# Patient Record
Sex: Female | Born: 1955 | Race: White | Hispanic: No | Marital: Married | Smoking: Former smoker
Health system: Southern US, Community
[De-identification: ages and names within clinical notes are randomized; demographics above are authoritative.]

## PROBLEM LIST (undated history)

## (undated) DIAGNOSIS — E118 Type 2 diabetes mellitus with unspecified complications: Secondary | ICD-10-CM

## (undated) DIAGNOSIS — I2510 Atherosclerotic heart disease of native coronary artery without angina pectoris: Secondary | ICD-10-CM

## (undated) DIAGNOSIS — E1165 Type 2 diabetes mellitus with hyperglycemia: Secondary | ICD-10-CM

## (undated) DIAGNOSIS — Z87891 Personal history of nicotine dependence: Secondary | ICD-10-CM

## (undated) DIAGNOSIS — I2109 ST elevation (STEMI) myocardial infarction involving other coronary artery of anterior wall: Secondary | ICD-10-CM

## (undated) DIAGNOSIS — K529 Noninfective gastroenteritis and colitis, unspecified: Secondary | ICD-10-CM

## (undated) DIAGNOSIS — E7800 Pure hypercholesterolemia, unspecified: Secondary | ICD-10-CM

## (undated) HISTORY — DX: Type 2 diabetes mellitus with unspecified complications: E11.8

## (undated) HISTORY — DX: ST elevation (STEMI) myocardial infarction involving other coronary artery of anterior wall: I21.09

## (undated) HISTORY — DX: Type 2 diabetes mellitus with hyperglycemia: E11.65

## (undated) HISTORY — DX: Atherosclerotic heart disease of native coronary artery without angina pectoris: I25.10

## (undated) HISTORY — DX: Pure hypercholesterolemia, unspecified: E78.00

## (undated) HISTORY — DX: Noninfective gastroenteritis and colitis, unspecified: K52.9

## (undated) HISTORY — DX: Personal history of nicotine dependence: Z87.891

---

## 2000-06-23 ENCOUNTER — Ambulatory Visit (HOSPITAL_COMMUNITY): Admission: RE | Admit: 2000-06-23 | Discharge: 2000-06-23 | Payer: Self-pay | Admitting: Neurosurgery

## 2000-06-23 ENCOUNTER — Encounter: Payer: Self-pay | Admitting: Neurosurgery

## 2000-07-17 ENCOUNTER — Encounter: Payer: Self-pay | Admitting: Neurosurgery

## 2000-07-19 ENCOUNTER — Encounter: Payer: Self-pay | Admitting: Neurosurgery

## 2000-07-19 ENCOUNTER — Inpatient Hospital Stay (HOSPITAL_COMMUNITY): Admission: RE | Admit: 2000-07-19 | Discharge: 2000-07-21 | Payer: Self-pay | Admitting: Neurosurgery

## 2000-08-06 ENCOUNTER — Encounter: Admission: RE | Admit: 2000-08-06 | Discharge: 2000-08-06 | Payer: Self-pay | Admitting: Neurosurgery

## 2000-08-06 ENCOUNTER — Encounter: Payer: Self-pay | Admitting: Neurosurgery

## 2000-09-06 ENCOUNTER — Encounter: Payer: Self-pay | Admitting: Neurosurgery

## 2000-09-06 ENCOUNTER — Encounter: Admission: RE | Admit: 2000-09-06 | Discharge: 2000-09-06 | Payer: Self-pay | Admitting: Neurosurgery

## 2001-01-26 ENCOUNTER — Encounter: Payer: Self-pay | Admitting: Family Medicine

## 2001-01-26 ENCOUNTER — Ambulatory Visit (HOSPITAL_COMMUNITY): Admission: RE | Admit: 2001-01-26 | Discharge: 2001-01-26 | Payer: Self-pay | Admitting: Neurosurgery

## 2001-03-31 ENCOUNTER — Encounter: Payer: Self-pay | Admitting: Neurosurgery

## 2001-03-31 ENCOUNTER — Ambulatory Visit (HOSPITAL_COMMUNITY): Admission: RE | Admit: 2001-03-31 | Discharge: 2001-03-31 | Payer: Self-pay | Admitting: Neurosurgery

## 2001-05-16 ENCOUNTER — Ambulatory Visit (HOSPITAL_COMMUNITY): Admission: RE | Admit: 2001-05-16 | Discharge: 2001-05-16 | Payer: Self-pay | Admitting: Neurosurgery

## 2002-07-15 ENCOUNTER — Encounter: Payer: Self-pay | Admitting: Neurosurgery

## 2002-07-15 ENCOUNTER — Encounter: Admission: RE | Admit: 2002-07-15 | Discharge: 2002-07-15 | Payer: Self-pay | Admitting: Neurosurgery

## 2003-02-25 ENCOUNTER — Ambulatory Visit (HOSPITAL_BASED_OUTPATIENT_CLINIC_OR_DEPARTMENT_OTHER): Admission: RE | Admit: 2003-02-25 | Discharge: 2003-02-25 | Payer: Self-pay | Admitting: Orthopedic Surgery

## 2015-10-04 ENCOUNTER — Inpatient Hospital Stay (HOSPITAL_COMMUNITY)
Admission: EM | Admit: 2015-10-04 | Discharge: 2015-10-06 | DRG: 247 | Disposition: A | Discharge status: Home / Self Care | Payer: Self-pay | Attending: Cardiology | Admitting: Cardiology

## 2015-10-04 ENCOUNTER — Ambulatory Visit (HOSPITAL_COMMUNITY): Admit: 2015-10-04 | Payer: Self-pay | Admitting: Cardiology

## 2015-10-04 ENCOUNTER — Other Ambulatory Visit: Payer: Self-pay

## 2015-10-04 ENCOUNTER — Other Ambulatory Visit (HOSPITAL_COMMUNITY): Payer: Self-pay

## 2015-10-04 ENCOUNTER — Encounter (HOSPITAL_COMMUNITY): Payer: Self-pay | Admitting: *Deleted

## 2015-10-04 ENCOUNTER — Encounter (HOSPITAL_COMMUNITY): Payer: Self-pay

## 2015-10-04 ENCOUNTER — Encounter (HOSPITAL_COMMUNITY)
Admission: EM | Disposition: A | Discharge status: Home / Self Care | Payer: Self-pay | Source: Home / Self Care | Attending: Cardiology

## 2015-10-04 DIAGNOSIS — Z79891 Long term (current) use of opiate analgesic: Secondary | ICD-10-CM

## 2015-10-04 DIAGNOSIS — I213 ST elevation (STEMI) myocardial infarction of unspecified site: Secondary | ICD-10-CM

## 2015-10-04 DIAGNOSIS — I10 Essential (primary) hypertension: Secondary | ICD-10-CM | POA: Diagnosis present

## 2015-10-04 DIAGNOSIS — R739 Hyperglycemia, unspecified: Secondary | ICD-10-CM

## 2015-10-04 DIAGNOSIS — G894 Chronic pain syndrome: Secondary | ICD-10-CM | POA: Diagnosis present

## 2015-10-04 DIAGNOSIS — I2109 ST elevation (STEMI) myocardial infarction involving other coronary artery of anterior wall: Principal | ICD-10-CM | POA: Diagnosis present

## 2015-10-04 DIAGNOSIS — E1169 Type 2 diabetes mellitus with other specified complication: Secondary | ICD-10-CM

## 2015-10-04 DIAGNOSIS — E872 Acidosis: Secondary | ICD-10-CM | POA: Diagnosis present

## 2015-10-04 DIAGNOSIS — M549 Dorsalgia, unspecified: Secondary | ICD-10-CM | POA: Diagnosis present

## 2015-10-04 DIAGNOSIS — M5030 Other cervical disc degeneration, unspecified cervical region: Secondary | ICD-10-CM | POA: Diagnosis present

## 2015-10-04 DIAGNOSIS — E785 Hyperlipidemia, unspecified: Secondary | ICD-10-CM | POA: Diagnosis present

## 2015-10-04 DIAGNOSIS — Z955 Presence of coronary angioplasty implant and graft: Secondary | ICD-10-CM

## 2015-10-04 DIAGNOSIS — E1165 Type 2 diabetes mellitus with hyperglycemia: Secondary | ICD-10-CM | POA: Diagnosis present

## 2015-10-04 DIAGNOSIS — F17200 Nicotine dependence, unspecified, uncomplicated: Secondary | ICD-10-CM | POA: Diagnosis present

## 2015-10-04 HISTORY — PX: CARDIAC CATHETERIZATION: SHX172

## 2015-10-04 HISTORY — PX: PERIPHERAL VASCULAR CATHETERIZATION: SHX172C

## 2015-10-04 HISTORY — DX: ST elevation (STEMI) myocardial infarction involving other coronary artery of anterior wall: I21.09

## 2015-10-04 LAB — TROPONIN I
Troponin I: >65 ng/mL (ref <0.031)
Troponin I: >65 ng/mL

## 2015-10-04 LAB — CBC
HCT: 36.7 % (ref 36.0–46.0)
Hemoglobin: 12.1 g/dL (ref 12.0–15.0)
MCH: 28.4 pg (ref 26.0–34.0)
MCHC: 33 g/dL (ref 30.0–36.0)
MCV: 86.2 fL (ref 78.0–100.0)
Platelets: 197 10*3/uL (ref 150–400)
RBC: 4.26 MIL/uL (ref 3.87–5.11)
RDW: 13.3 % (ref 11.5–15.5)
WBC: 17 10*3/uL — ABNORMAL HIGH (ref 4.0–10.5)

## 2015-10-04 LAB — POCT I-STAT 3, ART BLOOD GAS (G3+)
ACID-BASE DEFICIT: 3 mmol/L — AB (ref 0.0–2.0)
Acid-base deficit: 4 mmol/L — ABNORMAL HIGH (ref 0.0–2.0)
BICARBONATE: 22.9 meq/L (ref 20.0–24.0)
Bicarbonate: 22.9 meq/L (ref 20.0–24.0)
O2 SAT: 77 %
O2 Saturation: 97 %
PCO2 ART: 44.2 mmHg (ref 35.0–45.0)
PO2 ART: 45 mmHg — AB (ref 80.0–100.0)
TCO2: 24 mmol/L (ref 0–100)
TCO2: 24 mmol/L (ref 0–100)
pCO2 arterial: 48.6 mmHg — ABNORMAL HIGH (ref 35.0–45.0)
pH, Arterial: 7.282 — ABNORMAL LOW (ref 7.350–7.450)
pH, Arterial: 7.323 — ABNORMAL LOW (ref 7.350–7.450)
pO2, Arterial: 102 mmHg — ABNORMAL HIGH (ref 80.0–100.0)

## 2015-10-04 LAB — GLUCOSE, CAPILLARY
Glucose-Capillary: 294 mg/dL — ABNORMAL HIGH (ref 65–99)
Glucose-Capillary: 211 mg/dL — ABNORMAL HIGH (ref 65–99)

## 2015-10-04 LAB — CREATININE, SERUM
Creatinine, Ser: 0.54 mg/dL (ref 0.44–1.00)
GFR calc Af Amer: >60 mL/min
GFR calc non Af Amer: >60 mL/min

## 2015-10-04 LAB — LIPID PANEL
CHOL/HDL RATIO: 3 ratio
Cholesterol: 128 mg/dL (ref 0–200)
HDL: 42 mg/dL (ref >40)
LDL CALC: 73 mg/dL (ref 0–99)
TRIGLYCERIDES: 66 mg/dL (ref <150)
VLDL: 13 mg/dL (ref 0–40)

## 2015-10-04 LAB — MRSA PCR SCREENING: MRSA by PCR: NEGATIVE

## 2015-10-04 LAB — POCT ACTIVATED CLOTTING TIME
Activated Clotting Time: 252 seconds
Activated Clotting Time: 263 s
Activated Clotting Time: 296 s

## 2015-10-04 LAB — TSH: TSH: 0.613 u[IU]/mL (ref 0.350–4.500)

## 2015-10-04 SURGERY — LEFT HEART CATH AND CORONARY ANGIOGRAPHY
Anesthesia: LOCAL

## 2015-10-04 MED ORDER — ACETAMINOPHEN 325 MG PO TABS
650.0000 mg | ORAL_TABLET | ORAL | Status: DC | PRN
  Administered 2015-10-05: 650 mg via ORAL
  Filled 2015-10-04: qty 2

## 2015-10-04 MED ORDER — LIDOCAINE HCL (PF) 1 % IJ SOLN
INTRAMUSCULAR | Status: AC
  Filled 2015-10-04: qty 30

## 2015-10-04 MED ORDER — HYDROMORPHONE HCL 1 MG/ML IJ SOLN
INTRAMUSCULAR | Status: DC | PRN
  Administered 2015-10-04: 0.5 mg via INTRAVENOUS

## 2015-10-04 MED ORDER — HEPARIN SODIUM (PORCINE) 1000 UNIT/ML IJ SOLN
INTRAMUSCULAR | Status: AC
  Filled 2015-10-04: qty 1

## 2015-10-04 MED ORDER — PRASUGREL HCL 10 MG PO TABS
ORAL_TABLET | ORAL | Status: AC
  Filled 2015-10-04: qty 1

## 2015-10-04 MED ORDER — FENTANYL CITRATE (PF) 100 MCG/2ML IJ SOLN: 50.0000 ug | INTRAMUSCULAR | Status: DC | PRN

## 2015-10-04 MED ORDER — PRASUGREL HCL 10 MG PO TABS
ORAL_TABLET | ORAL | Status: AC
  Filled 2015-10-04: qty 4

## 2015-10-04 MED ORDER — HEPARIN (PORCINE) IN NACL 2-0.9 UNIT/ML-% IJ SOLN
INTRAMUSCULAR | Status: AC
  Filled 2015-10-04: qty 1500

## 2015-10-04 MED ORDER — IOPAMIDOL (ISOVUE-370) INJECTION 76%
INTRAVENOUS | Status: AC
  Filled 2015-10-04: qty 50

## 2015-10-04 MED ORDER — ASPIRIN 81 MG PO CHEW
81.0000 mg | CHEWABLE_TABLET | Freq: Every day | ORAL | Status: DC
  Administered 2015-10-05 – 2015-10-06 (×2): 81 mg via ORAL
  Filled 2015-10-04 (×2): qty 1

## 2015-10-04 MED ORDER — VERAPAMIL HCL 2.5 MG/ML IV SOLN
INTRAVENOUS | Status: AC
  Filled 2015-10-04: qty 2

## 2015-10-04 MED ORDER — ENOXAPARIN SODIUM 40 MG/0.4ML ~~LOC~~ SOLN
40.0000 mg | SUBCUTANEOUS | Status: DC
  Administered 2015-10-05 – 2015-10-06 (×2): 40 mg via SUBCUTANEOUS
  Filled 2015-10-04 (×2): qty 0.4

## 2015-10-04 MED ORDER — INSULIN ASPART 100 UNIT/ML ~~LOC~~ SOLN
0.0000 [IU] | Freq: Three times a day (TID) | SUBCUTANEOUS | Status: DC
  Administered 2015-10-04 – 2015-10-05 (×3): 8 [IU] via SUBCUTANEOUS
  Administered 2015-10-05: 5 [IU] via SUBCUTANEOUS
  Administered 2015-10-06: 3 [IU] via SUBCUTANEOUS
  Administered 2015-10-06: 5 [IU] via SUBCUTANEOUS

## 2015-10-04 MED ORDER — IOPAMIDOL (ISOVUE-370) INJECTION 76%
INTRAVENOUS | Status: DC | PRN
  Administered 2015-10-04: 300 mL via INTRA_ARTERIAL

## 2015-10-04 MED ORDER — ONDANSETRON HCL 4 MG/2ML IJ SOLN: 4.0000 mg | Freq: Once | INTRAMUSCULAR | Status: DC

## 2015-10-04 MED ORDER — SODIUM CHLORIDE 0.9 % IV SOLN: 250.0000 mL | INTRAVENOUS | Status: DC | PRN

## 2015-10-04 MED ORDER — HEPARIN (PORCINE) IN NACL 2-0.9 UNIT/ML-% IJ SOLN
INTRAMUSCULAR | Status: DC | PRN
  Administered 2015-10-04: 1500 mL

## 2015-10-04 MED ORDER — HYDROMORPHONE HCL 1 MG/ML IJ SOLN
INTRAMUSCULAR | Status: AC
  Filled 2015-10-04: qty 1

## 2015-10-04 MED ORDER — SODIUM CHLORIDE 0.9% FLUSH
3.0000 mL | INTRAVENOUS | Status: DC | PRN
Start: 2015-10-04 — End: 2015-10-06
  Administered 2015-10-05: 3 mL via INTRAVENOUS
  Filled 2015-10-04: qty 3

## 2015-10-04 MED ORDER — ONDANSETRON HCL 4 MG/2ML IJ SOLN
4.0000 mg | Freq: Four times a day (QID) | INTRAMUSCULAR | Status: DC | PRN
Start: 2015-10-04 — End: 2015-10-06

## 2015-10-04 MED ORDER — MIDAZOLAM HCL 2 MG/2ML IJ SOLN
INTRAMUSCULAR | Status: AC
  Filled 2015-10-04: qty 2

## 2015-10-04 MED ORDER — MORPHINE SULFATE (PF) 4 MG/ML IV SOLN
4.0000 mg | Freq: Once | INTRAVENOUS | Status: DC
Start: 2015-10-04 — End: 2015-10-04

## 2015-10-04 MED ORDER — IOPAMIDOL (ISOVUE-370) INJECTION 76%
INTRAVENOUS | Status: AC
  Filled 2015-10-04: qty 125

## 2015-10-04 MED ORDER — METOPROLOL TARTRATE 12.5 MG HALF TABLET
12.5000 mg | ORAL_TABLET | Freq: Two times a day (BID) | ORAL | Status: DC
  Administered 2015-10-04 – 2015-10-06 (×4): 12.5 mg via ORAL
  Filled 2015-10-04 (×4): qty 1

## 2015-10-04 MED ORDER — VERAPAMIL HCL 2.5 MG/ML IV SOLN
INTRA_ARTERIAL | Status: DC | PRN
  Administered 2015-10-04: 15 mL via INTRA_ARTERIAL

## 2015-10-04 MED ORDER — PRASUGREL HCL 10 MG PO TABS
10.0000 mg | ORAL_TABLET | Freq: Every day | ORAL | Status: DC
  Administered 2015-10-05 – 2015-10-06 (×2): 10 mg via ORAL
  Filled 2015-10-04 (×2): qty 1

## 2015-10-04 MED ORDER — BISOPROLOL FUMARATE 5 MG PO TABS: 5.0000 mg | ORAL_TABLET | Freq: Every day | ORAL | Status: DC

## 2015-10-04 MED ORDER — NITROGLYCERIN 1 MG/10 ML FOR IR/CATH LAB
INTRA_ARTERIAL | Status: AC
  Filled 2015-10-04: qty 10

## 2015-10-04 MED ORDER — SODIUM CHLORIDE 0.9% FLUSH
3.0000 mL | Freq: Two times a day (BID) | INTRAVENOUS | Status: DC
  Administered 2015-10-04: 3 mL via INTRAVENOUS

## 2015-10-04 MED ORDER — INSULIN ASPART 100 UNIT/ML ~~LOC~~ SOLN
3.0000 [IU] | Freq: Three times a day (TID) | SUBCUTANEOUS | Status: DC
  Administered 2015-10-04 – 2015-10-06 (×6): 3 [IU] via SUBCUTANEOUS

## 2015-10-04 MED ORDER — ATORVASTATIN CALCIUM 80 MG PO TABS
80.0000 mg | ORAL_TABLET | Freq: Every day | ORAL | Status: DC
  Administered 2015-10-04 – 2015-10-05 (×2): 80 mg via ORAL
  Filled 2015-10-04 (×2): qty 1

## 2015-10-04 MED ORDER — LIDOCAINE HCL (PF) 1 % IJ SOLN
INTRAMUSCULAR | Status: DC | PRN
  Administered 2015-10-04: 3 mL

## 2015-10-04 MED ORDER — MIDAZOLAM HCL 2 MG/2ML IJ SOLN
INTRAMUSCULAR | Status: DC | PRN
  Administered 2015-10-04: 3 mg via INTRAVENOUS

## 2015-10-04 MED ORDER — HEPARIN SODIUM (PORCINE) 1000 UNIT/ML IJ SOLN
INTRAMUSCULAR | Status: DC | PRN
  Administered 2015-10-04: 7000 [IU] via INTRAVENOUS
  Administered 2015-10-04 (×2): 3000 [IU] via INTRAVENOUS
  Administered 2015-10-04: 1000 [IU] via INTRAVENOUS

## 2015-10-04 MED ORDER — PRASUGREL HCL 10 MG PO TABS
ORAL_TABLET | ORAL | Status: DC | PRN
  Administered 2015-10-04: 60 mg via ORAL

## 2015-10-04 MED ORDER — SODIUM CHLORIDE 0.9 % WEIGHT BASED INFUSION
1.0000 mL/kg/h | INTRAVENOUS | Status: AC
  Administered 2015-10-04: 1 mL/kg/h via INTRAVENOUS

## 2015-10-04 SURGICAL SUPPLY — 20 items
BALLN EMERGE MR 2.0X8 (BALLOONS) ×3
BALLN EUPHORA RX 3.5X15 (BALLOONS) ×3
BALLOON EMERGE MR 2.0X8 (BALLOONS) ×2 IMPLANT
BALLOON EUPHORA RX 3.5X15 (BALLOONS) ×2 IMPLANT
CATH EXTRAC PRONTO 5.5F 138CM (CATHETERS) ×3 IMPLANT
CATH INFINITI 5FR ANG PIGTAIL (CATHETERS) ×3 IMPLANT
CATH OPTITORQUE TIG 4.0 5F (CATHETERS) ×3 IMPLANT
CATH VISTA GUIDE 6FR XBLAD3.5 (CATHETERS) ×3 IMPLANT
DEVICE RAD COMP TR BAND LRG (VASCULAR PRODUCTS) ×3 IMPLANT
GLIDESHEATH SLEND A-KIT 6F 20G (SHEATH) ×3 IMPLANT
KIT ENCORE 26 ADVANTAGE (KITS) ×3 IMPLANT
KIT ESSENTIALS PG (KITS) ×3 IMPLANT
KIT HEART LEFT (KITS) ×3 IMPLANT
PACK CARDIAC CATHETERIZATION (CUSTOM PROCEDURE TRAY) ×3 IMPLANT
STENT XIENCE ALPINE RX 3.5X12 (Permanent Stent) ×3 IMPLANT
STENT XIENCE ALPINE RX 3.5X18 (Permanent Stent) ×3 IMPLANT
TRANSDUCER W/STOPCOCK (MISCELLANEOUS) ×3 IMPLANT
TUBING CIL FLEX 10 FLL-RA (TUBING) ×3 IMPLANT
WIRE COUGAR XT STRL 190CM (WIRE) ×3 IMPLANT
WIRE SAFE-T 1.5MM-J .035X260CM (WIRE) ×3 IMPLANT

## 2015-10-04 NOTE — H&P (Signed)
Sonia Morales is an 60 y.o. female.   Chief Complaint: Chest pain and arm pain HPI: Sonia Morales  is a 60 y.o. female  With history of diabetes mellitus, hypertension, hyperlipidemia, ongoing tobacco use disorder, chronic pain syndrome, chronic back pain and neck pain and on chronic methadone therapy, admitted with upper chest discomfort and radiation to the arms, history very difficult to make out, initially admitted to [facility], there was suspicion for STEMI however there was ST elevation in lead V2 without any other reciprocal changes. As symptoms were very atypical and difficult to make out due to chronic narcotic use, felt that she needed to be transferred to the hospital on an urgent basis with continued reevaluation. Due to chest pain patient was transferred to [facility] emergency room.  In the emergency room repeat EKG revealed ST elevation anterolateral leads that was clear-cut to suggest anterolateral ST elevation MI. Hence emergently patient was transferred to the cardiac catheterization lab.  Patient has chronic dyspnea on exertion. No PND or orthopnea. Denied any neurological deficits or prior stroke. Denies symptoms of claudication.  Social History: Active smoker. Smokes at least one pack of cigars per day.  Family history: No family history of premature coronary artery disease.  Past medical history: As dictated above in the history of present illness.  Allergies: Allergies no known allergies  Review of Systems - Negative except Chest pain and dyspnea, chronic back pain and neck pain.  Blood pressure 117/78, pulse 107, temperature 98.2 F (36.8 C), temperature source Oral, resp. rate 17, height 5\' 3"  (1.6 m), weight 73.8 kg (162 lb 11.2 oz), SpO2 92 %. General appearance: alert, cooperative, appears older than stated age, mild distress and Due to pain. Eyes: negative findings: lids and lashes normal Neck: no adenopathy, no carotid bruit, no JVD, supple, symmetrical,  trachea midline and thyroid not enlarged, symmetric, no tenderness/mass/nodules Neck: JVP - normal, carotids 2+= without bruits Resp: clear to auscultation bilaterally Chest wall: no tenderness Cardio: regular rate and rhythm, S1, S2 normal, no murmur, click, rub or gallop GI: soft, non-tender; bowel sounds normal; no masses,  no organomegaly Extremities: extremities normal, atraumatic, no cyanosis or edema Pulses: Bilateral carotids without bruit, femoral pulses normal without bruit, popliteal pulses faint, left dorsalis pedis normal, right 1+. PT could not be felt bilaterally. Skin: Skin color, texture, turgor normal. No rashes or lesions Neurologic: Grossly normal  Results for orders placed or performed during the hospital encounter of 10/04/15 (from the past 48 hour(s))  I-STAT 3, arterial blood gas (G3+)     Status: Abnormal   Collection Time: 10/04/15  3:00 PM  Result Value Ref Range   pH, Arterial 7.323 (L) 7.350 - 7.450   pCO2 arterial 44.2 35.0 - 45.0 mmHg   pO2, Arterial 45.0 (L) 80.0 - 100.0 mmHg   Bicarbonate 22.9 20.0 - 24.0 mEq/L   TCO2 24 0 - 100 mmol/L   O2 Saturation 77.0 %   Acid-base deficit 3.0 (H) 0.0 - 2.0 mmol/L   Patient temperature HIDE    Sample type ARTERIAL   POCT Activated clotting time     Status: None   Collection Time: 10/04/15  3:05 PM  Result Value Ref Range   Activated Clotting Time 252 seconds  POCT Activated clotting time     Status: None   Collection Time: 10/04/15  3:18 PM  Result Value Ref Range   Activated Clotting Time 263 seconds  I-STAT 3, arterial blood gas (G3+)  Status: Abnormal   Collection Time: 10/04/15  3:19 PM  Result Value Ref Range   pH, Arterial 7.282 (L) 7.350 - 7.450   pCO2 arterial 48.6 (H) 35.0 - 45.0 mmHg   pO2, Arterial 102.0 (H) 80.0 - 100.0 mmHg   Bicarbonate 22.9 20.0 - 24.0 mEq/L   TCO2 24 0 - 100 mmol/L   O2 Saturation 97.0 %   Acid-base deficit 4.0 (H) 0.0 - 2.0 mmol/L   Patient temperature HIDE    Sample  type ARTERIAL   POCT Activated clotting time     Status: None   Collection Time: 10/04/15  3:56 PM  Result Value Ref Range   Activated Clotting Time 296 seconds  Glucose, capillary     Status: Abnormal   Collection Time: 10/04/15  4:50 PM  Result Value Ref Range   Glucose-Capillary 294 (H) 65 - 99 mg/dL   Comment 1 Capillary Specimen   EKG 10/04/2015 at 1429 hrs.: Sinus tachycardia at the rate of 113 bpm, left axis deviation, left anterior fascicular block. Poor R progression. LVH. 1 mm ST segment elevation in lead V2 through V5 and 1 and aVL, STEMI anterolateral leads. Compared to prior EKG, anterolateral ST elevation now present.   Current facility-administered medications:  .  0.9 %  sodium chloride infusion, 250 mL, Intravenous, PRN, Yates Decamp, MD .  0.9% sodium chloride infusion, 1 mL/kg/hr, Intravenous, Continuous, Yates Decamp, MD .  acetaminophen (TYLENOL) tablet 650 mg, 650 mg, Oral, Q4H PRN, Yates Decamp, MD .  aspirin chewable tablet 81 mg, 81 mg, Oral, Daily, Yates Decamp, MD, 81 mg at 10/04/15 1651 .  [START ON 10/05/2015] enoxaparin (LOVENOX) injection 40 mg, 40 mg, Subcutaneous, Q24H, Yates Decamp, MD .  insulin aspart (novoLOG) injection 0-15 Units, 0-15 Units, Subcutaneous, TID WC, Yates Decamp, MD .  insulin aspart (novoLOG) injection 3 Units, 3 Units, Subcutaneous, TID WC, Yates Decamp, MD .  ondansetron (ZOFRAN) injection 4 mg, 4 mg, Intravenous, Q6H PRN, Yates Decamp, MD .  Melene Muller ON 10/05/2015] prasugrel (EFFIENT) tablet 10 mg, 10 mg, Oral, Daily, Yates Decamp, MD .  sodium chloride flush (NS) 0.9 % injection 3 mL, 3 mL, Intravenous, Q12H, Yates Decamp, MD .  sodium chloride flush (NS) 0.9 % injection 3 mL, 3 mL, Intravenous, PRN, Yates Decamp, MD  Assessment/Plan 1. Anterolateral STEMI 2. Diabetes mellitus type 2 uncontrolled 3. Hypertension 4. Hyperlipidemia 5. Diabetes mellitus type 2 uncontrolled 6. Chronic pain syndrome 7. Mild respiratory acidosis.  Recommendation: Patient taken  emergently to cardiac catheterization lab to evaluate coronary anatomy. Further recommendations will follow following angiography. Emergent consent obtained.  Yates Decamp, MD 10/04/2015, 4:58 PM [facility] Pager: [phone] Office: [phone] If no answer: Cell:  [phone]

## 2015-10-04 NOTE — Progress Notes (Signed)
Notified Cards Fellow of [facility]G 211. No new orders given and will re check in AM at 0800

## 2015-10-04 NOTE — Progress Notes (Signed)
Pt c/o chest pain, mid sternal, radiating to arms bilaterally. 4/10 with some "mild" SOB. RR 18 and O2 98% on 4L [place]. Pt not in distress. BP 97/86, HR 91 NSR. EKG obtained and Dr. Jacinto Halim called. Orders for fentanyl PRN and changes to beta blocker received.

## 2015-10-04 NOTE — ED Provider Notes (Signed)
CSN: [id number]     Arrival date & time 10/04/15  1429 History   First MD Initiated Contact with Patient 10/04/15 1431     No chief complaint on file.  PT IS A 60 YO WF Sonia Morales WITH CP.  PT HAD A POSITIVE TROPONIN WITH CONTINUED PAIN.  IT WAS INITIALLY CALLED OUT AS A CODE STEMI, BUT THEN IT WAS CANCELLED EN ROUTE, SO PT CAME TO THE ED.  STEMI COORDINATOR MET PT AT [place].  (Consider location/radiation/quality/duration/timing/severity/associated sxs/prior Treatment) The history is provided by the patient.    No past medical history on file.DM No past surgical history on file. No family history on file. Social History  Substance Use Topics  . Smoking status: Not on file  . Smokeless tobacco: Not on file  . Alcohol Use: Not on file  +TOB OB History    No data available     Review of Systems  Cardiovascular: Positive for chest pain.  All other systems reviewed and are negative.     Allergies  Review of patient's allergies indicates not on file.  Home Medications   Prior to Admission medications   Not on File   There were no vitals taken for this visit. Physical Exam  Constitutional: She is oriented to person, place, and time. She appears well-developed. She appears distressed.  HENT:  Head: Normocephalic and atraumatic.  Right Ear: External ear normal.  Left Ear: External ear normal.  Nose: Nose normal.  Mouth/Throat: Oropharynx is clear and moist.  Eyes: Conjunctivae and EOM are normal. Pupils are equal, round, and reactive to light.  Neck: Normal range of motion. Neck supple.  Cardiovascular: Normal rate, regular rhythm, normal heart sounds and intact distal pulses.   Pulmonary/Chest: Effort normal and breath sounds normal.  Abdominal: Soft. Bowel sounds are normal.  Musculoskeletal: Normal range of motion.  Neurological: She is alert and oriented to person, place, and time.  Skin: Skin is warm.  Psychiatric: She has a normal mood  and affect. Her behavior is normal. Judgment and thought content normal.  Nursing note and vitals reviewed.   ED Course  Procedures (including critical care time) Labs Review Labs Reviewed - No data to display  Imaging Review No results found. I have personally reviewed and evaluated these images and lab results as part of my medical decision-making.   EKG Interpretation None     EKG WITH ST ELEVATION LATERALLY MDM  LABS AND CHART REVIEWED FROM OUTSIDE HOSPITAL. Final diagnoses:  ST elevation myocardial infarction (STEMI), unspecified artery ([place])  Hyperglycemia      Sonia Pence, MD 10/04/15 1515

## 2015-10-05 ENCOUNTER — Encounter (HOSPITAL_COMMUNITY): Payer: Self-pay | Admitting: Cardiology

## 2015-10-05 ENCOUNTER — Inpatient Hospital Stay (HOSPITAL_COMMUNITY): Payer: MEDICAID

## 2015-10-05 LAB — HEMOGLOBIN A1C
Hgb A1c MFr Bld: 10.6 % — ABNORMAL HIGH (ref 4.8–5.6)
MEAN PLASMA GLUCOSE: 258 mg/dL

## 2015-10-05 LAB — BASIC METABOLIC PANEL
ANION GAP: 9 (ref 5–15)
BUN: 10 mg/dL (ref 6–20)
CHLORIDE: 103 mmol/L (ref 101–111)
CO2: 25 mmol/L (ref 22–32)
Calcium: 9.2 mg/dL (ref 8.9–10.3)
Creatinine, Ser: 0.56 mg/dL (ref 0.44–1.00)
GFR calc Af Amer: >60 mL/min (ref >60)
GLUCOSE: 259 mg/dL — AB (ref 65–99)
POTASSIUM: 3.8 mmol/L (ref 3.5–5.1)
SODIUM: 137 mmol/L (ref 135–145)

## 2015-10-05 LAB — ECHOCARDIOGRAM COMPLETE
CHL CUP STROKE VOLUME: 20 mL
E/e' ratio: 17.57
EWDT: 106 ms
FS: 24 % — AB (ref 28–44)
Height: 63 in
IVS/LV PW RATIO, ED: 1.26
LA ID, A-P, ES: 36 mm
LA vol index: 29.2 mL/m2
LADIAMINDEX: 1.96 cm/m2
LAVOL: 53.6 mL
LAVOLA4C: 55.9 mL
LEFT ATRIUM END SYS DIAM: 36 mm
LV TDI E'LATERAL: 5.75
LV TDI E'MEDIAL: 6.37
LV sys vol index: 33 mL/m2
LV sys vol: 60 mL — AB (ref 14–42)
LVDIAVOL: 80 mL (ref 46–106)
LVDIAVOLIN: 44 mL/m2
LVEEAVG: 17.57
LVEEMED: 17.57
LVELAT: 5.75 cm/s
LVOT area: 3.46 cm2
LVOT diameter: 21 mm
MV Dec: 106
MV Peak grad: 4 mmHg
MVPKAVEL: 101 m/s
MVPKEVEL: 101 m/s
PW: 11.8 mm — AB (ref 0.6–1.1)
RV TAPSE: 18.9 mm
Simpson's disk: 25
Weight: 2603.19 oz

## 2015-10-05 LAB — CBC
HEMATOCRIT: 36.8 % (ref 36.0–46.0)
HEMOGLOBIN: 12 g/dL (ref 12.0–15.0)
MCH: 28.8 pg (ref 26.0–34.0)
MCHC: 32.6 g/dL (ref 30.0–36.0)
MCV: 88.2 fL (ref 78.0–100.0)
Platelets: 186 10*3/uL (ref 150–400)
RBC: 4.17 MIL/uL (ref 3.87–5.11)
RDW: 13.5 % (ref 11.5–15.5)
WBC: 16.2 10*3/uL — AB (ref 4.0–10.5)

## 2015-10-05 LAB — TROPONIN I
TROPONIN I: 60.35 ng/mL — AB (ref <0.031)
Troponin I: 47.25 ng/mL (ref <0.031)
Troponin I: 35.79 ng/mL (ref <0.031)

## 2015-10-05 LAB — GLUCOSE, CAPILLARY
GLUCOSE-CAPILLARY: 267 mg/dL — AB (ref 65–99)
GLUCOSE-CAPILLARY: 237 mg/dL — AB (ref 65–99)
GLUCOSE-CAPILLARY: 263 mg/dL — AB (ref 65–99)
Glucose-Capillary: 102 mg/dL — ABNORMAL HIGH (ref 65–99)

## 2015-10-05 MED ORDER — OXYCODONE HCL 5 MG PO TABS
10.0000 mg | ORAL_TABLET | Freq: Three times a day (TID) | ORAL | Status: DC | PRN
  Administered 2015-10-05 (×2): 20 mg via ORAL
  Filled 2015-10-05 (×2): qty 4

## 2015-10-05 MED ORDER — HEART ATTACK BOUNCING BOOK
Freq: Once | Status: AC
  Administered 2015-10-05: 08:00:00
  Filled 2015-10-05: qty 1

## 2015-10-05 MED ORDER — METHADONE HCL 10 MG PO TABS
10.0000 mg | ORAL_TABLET | Freq: Three times a day (TID) | ORAL | Status: DC
  Administered 2015-10-05 – 2015-10-06 (×4): 10 mg via ORAL
  Filled 2015-10-05 (×4): qty 1

## 2015-10-05 MED ORDER — VENLAFAXINE HCL 37.5 MG PO TABS
37.5000 mg | ORAL_TABLET | Freq: Two times a day (BID) | ORAL | Status: DC | PRN
  Filled 2015-10-05: qty 1

## 2015-10-05 MED ORDER — GLYBURIDE 5 MG PO TABS
5.0000 mg | ORAL_TABLET | Freq: Two times a day (BID) | ORAL | Status: DC
  Administered 2015-10-05 – 2015-10-06 (×3): 5 mg via ORAL
  Filled 2015-10-05 (×4): qty 1

## 2015-10-05 MED ORDER — LIVING WELL WITH DIABETES BOOK
Freq: Once | Status: AC
  Administered 2015-10-05: 08:00:00
  Filled 2015-10-05: qty 1

## 2015-10-05 MED ORDER — NICOTINE 14 MG/24HR TD PT24
14.0000 mg | MEDICATED_PATCH | Freq: Every day | TRANSDERMAL | Status: DC
  Administered 2015-10-05 – 2015-10-06 (×2): 14 mg via TRANSDERMAL
  Filled 2015-10-05 (×2): qty 1

## 2015-10-05 NOTE — Progress Notes (Signed)
CARDIAC REHAB PHASE I   PRE:  Rate/Rhythm: 102 ST    BP: sitting 99/78    SaO2: 94 RA  MODE:  Ambulation: 90 ft   POST:  Rate/Rhythm: 118 ST    BP: sitting 106/72     SaO2: 93 Ra  Pt c/o significant chest pain. Sts its 7/10 at rest, worse with deep breaths. Sts it feels like she has been punched. Pt nervous to get up due to "weakness" and sts she used the bedpan earlier. Encouraged pt to slowly get up which she was able to do. Slightly off balance walking, increased labor of breathing, HR up to 118 ST. Walked short distance and return to recliner. VSS, sts her CP did not increase or decrease. I gave her education materials however she had a visitor so did not get to discuss.  Husband present. He sts she normally is very active and only takes her methadone 1-2 times a day (prescribed x3 qd). Will f/u tomorrow. [phone]  Harriet Masson CES, ACSM 10/05/2015 11:24 AM

## 2015-10-05 NOTE — Care Management Note (Signed)
Case Management Note  Patient Details  Name: Sonia Morales MRN: [id number] Date of Birth: [birth year]  Subjective/Objective:    Adm w mi                Action/Plan: lives w husband, lives in [place]   Expected Discharge Date:                  Expected Discharge Plan:  Home/Self Care  In-House Referral:     Discharge planning Services  CM Consult, Medication Assistance, Indigent Health Clinic  Post Acute Care Choice:    Choice offered to:     DME Arranged:    DME Agency:     HH Arranged:    HH Agency:     Status of Service:     If discussed at [employer], dates discussed:    Additional Comments: gave pt 30day free effeint card. No ins. Gave pt inform on [facility]. Placed effient pt assist form on shadow chart.  Hanley Hays, RN 10/05/2015, 9:38 AM

## 2015-10-05 NOTE — Progress Notes (Signed)
Subjective:  No further chest burning (angina). C/O Chest pain on deep breath. Feels much better. Breathing  Better.  Objective:  Vital Signs in the last 24 hours: Temp:  [98.2 F (36.8 C)-98.9 F (37.2 C)] 98.8 F (37.1 C) (06/21 0800) Pulse Rate:  [85-145] 105 (06/21 0800) Resp:  [7-62] 21 (06/21 0800) BP: (99-152)/(62-105) 114/62 mmHg (06/21 0800) SpO2:  [82 %-99 %] 97 % (06/21 0800) Weight:  [73.8 kg (162 lb 11.2 oz)] 73.8 kg (162 lb 11.2 oz) (06/20 1630)  Intake/Output from previous day: 06/20 0701 - 06/21 0700 In: 303.8 [I.V.:303.8] Out: 1250 [Urine:1250]  Physical Exam: General appearance: alert, cooperative, appears older than stated age, mild distress and Due to pain. Eyes: negative findings: lids and lashes normal Neck: no adenopathy, no carotid bruit, no JVD, supple, symmetrical, trachea midline and thyroid not enlarged, symmetric, no tenderness/mass/nodules Neck: JVP - normal, carotids 2+= without bruits Resp: clear to auscultation bilaterally Chest wall: no tenderness Cardio: regular rate and rhythm, S1, S2 normal, no murmur, click, rub or gallop GI: soft, non-tender; bowel sounds normal; no masses, no organomegaly Extremities: extremities normal, atraumatic, no cyanosis or edema Pulses: Right  Carotid bruit, femoral pulses normal without bruit, popliteal pulses faint, left dorsalis pedis normal, right 1+. PT could not be felt bilaterally.  Lab Results: BMP  Recent Labs  10/04/15 1814 10/05/15 0410  NA  --  137  K  --  3.8  CL  --  103  CO2  --  25  GLUCOSE  --  259*  BUN  --  10  CREATININE 0.54 0.56  CALCIUM  --  9.2  GFRNONAA >60 >60  GFRAA >60 >60    CBC  Recent Labs Lab 10/05/15 0410  WBC 16.2*  RBC 4.17  HGB 12.0  HCT 36.8  PLT 186  MCV 88.2  MCH 28.8  MCHC 32.6  RDW 13.5    HEMOGLOBIN A1C Lab Results  Component Value Date   HGBA1C 10.6* 10/04/2015   MPG 258 10/04/2015    Cardiac Panel (last 3 results)  Recent Labs  10/04/15 1814 10/04/15 2247 10/05/15 0410  TROPONINI >65.00* >65.00* >65.00*   Recent Labs  10/04/15 1814  TSH 0.613   Cardiac Studies: Echo pending  EKG: Evolving anterolateral MI.   Assessment/Plan:  1. Anterolateral STEMI  Coronary angio: Left main mild calcification. LAD tortuous origin, occluded in the proximal segment. Diagonal 2 is moderate-sized, has proximal 50% stenosis. Mild disease in the mid to distal LAD. S/P thrombectomy followed by DES stenting with overlapping 3.5 x 18 and 3.5 x 12 mm Xience Alpine stents, 100% reduced to 0% TIMI 0 flow improved TIMI-3 flow.  Ramus intermediate has a tortuous origin. Mild disease. Circumflex mild disease. Right coronary artery mild diffuse disease.  2. Diabetes mellitus type 2 uncontrolled 3. Hypertension 4. Hyperlipidemia 5. Diabetes mellitus type 2 uncontrolled 6. Chronic pain syndrome 7. Right carotid bruit.  Rec: Add Glyburide. DM coordinator, Transfer to tele. Smoking cessation. F/Y Trop. Home in 24 to 48 hours.   Yates Decamp, M.D. 10/05/2015, 8:43 AM [facility] Pager: [phone] Office: [phone] If no answer: [phone]

## 2015-10-05 NOTE — Progress Notes (Signed)
Patient continues to C/O 6/10 midsternal chest soreness, states, "it feels like someone punched me."  Patient endorses SOB during ambulation and HR in 110's.  Dr. Jacinto Halim notified and says he is not concerned with this pain being cardiac related.  Will continue to monitor. Leary, Mitzi Hansen

## 2015-10-05 NOTE — Progress Notes (Signed)
Inpatient Diabetes Program Recommendations  AACE/ADA: New Consensus Statement on Inpatient Glycemic Control (2015)  Target Ranges:  Prepandial:   less than 140 mg/dL      Peak postprandial:   less than 180 mg/dL (1-2 hours)      Critically ill patients:  140 - 180 mg/dL   Results for Sonia Morales, Sonia Morales (MRN [id number]) as of 10/05/2015 10:02  Ref. Range 10/04/2015 16:50 10/04/2015 21:20 10/05/2015 07:39  Glucose-Capillary Latest Ref Range: 65-99 mg/dL 322 (H) 025 (H) 427 (H)   Review of Glycemic Control  Diabetes history: DM 2 Outpatient Diabetes medications: Metformin 500 mg bid Current orders for Inpatient glycemic control: Glyburide 5 mg bid + Novolog 3 units meal coverage tid + Novolog correction 0-15 tid  Inpatient Diabetes Program Recommendations:  Noted A1c 10.6.  Please consider: Discontinue Glyburide Start basal insulin Lantus 15 units q hs (0.2 units/kg) Add Novolog correction 0-5 units q hs Nurses please review diabetes patient video when appropriate and start teaching insulin injections if plan to discharge on insulin.  Will follow while in the hospital.  Thank you, Billy Fischer. Dewan Emond, RN, MSN, CDE Inpatient Glycemic Control Team Team Pager [phone] (8am-5pm) 10/05/2015 10:09 AM

## 2015-10-05 NOTE — Progress Notes (Signed)
Pt's blood pressure 90/58 manually pulse rate of 111.Pt has no complaints and is asymptomatic at this time.  MD made aware. Metoprolol and scheduled methadone due. Medications given per MD order. Will continue to monitor pt. George Hugh RN

## 2015-10-05 NOTE — Progress Notes (Signed)
  Echocardiogram 2D Echocardiogram has been performed.  Leta Jungling M 10/05/2015, 11:05 AM

## 2015-10-06 LAB — GLUCOSE, CAPILLARY
GLUCOSE-CAPILLARY: 244 mg/dL — AB (ref 65–99)
Glucose-Capillary: 191 mg/dL — ABNORMAL HIGH (ref 65–99)

## 2015-10-06 LAB — TROPONIN I
TROPONIN I: 28.55 ng/mL — AB (ref <0.031)
TROPONIN I: 19.1 ng/mL — AB (ref <0.031)

## 2015-10-06 MED ORDER — NICOTINE 14 MG/24HR TD PT24: 14.0000 mg | MEDICATED_PATCH | Freq: Every day | TRANSDERMAL | Status: DC

## 2015-10-06 MED ORDER — ATORVASTATIN CALCIUM 80 MG PO TABS: 80.0000 mg | ORAL_TABLET | Freq: Every day | ORAL | Status: DC

## 2015-10-06 MED ORDER — PRASUGREL HCL 10 MG PO TABS: 10.0000 mg | ORAL_TABLET | Freq: Every day | ORAL | Status: DC

## 2015-10-06 MED ORDER — GLYBURIDE 5 MG PO TABS: 5.0000 mg | ORAL_TABLET | Freq: Two times a day (BID) | ORAL | Status: DC

## 2015-10-06 MED ORDER — METOPROLOL TARTRATE 25 MG PO TABS: 12.5000 mg | ORAL_TABLET | Freq: Two times a day (BID) | ORAL | Status: DC

## 2015-10-06 NOTE — Progress Notes (Signed)
Inpatient Diabetes Program Recommendations  AACE/ADA: New Consensus Statement on Inpatient Glycemic Control (2015)  Target Ranges:  Prepandial:   less than 140 mg/dL      Peak postprandial:   less than 180 mg/dL (1-2 hours)      Critically ill patients:  140 - 180 mg/dL  Results for ROY, PALMITER (MRN [id number]) as of 10/06/2015 08:09  Ref. Range 10/05/2015 07:39 10/05/2015 11:43 10/05/2015 16:22 10/05/2015 20:21 10/06/2015 07:31  Glucose-Capillary Latest Ref Range: 65-99 mg/dL 983 (H) 382 (H) 505 (H) 102 (H) 244 (H)   Review of Glycemic Control  Diabetes history: DM 2 Outpatient Diabetes medications: Metformin 500 mg bid Current orders for Inpatient glycemic control: Glyburide 5 mg bid + Novolog 3 units meal coverage tid + Novolog correction 0-15 tid  Inpatient Diabetes Program Recommendations:  Reviewed CBGs are elevated ranging to 267 even though on meal coverage, Glyburide bid and Novolog correction.  Patient is Medicaid potential so would recommend Novolin 70/30 insulin 10 units bid ac meals. Patient states willingness to take insulin if needed for glycemic control. Spoke with patient @ length yesterday and explained elevated A1c and risks of cardiovascular disease. Gave handouts on basic plate method nutrition and A1c. Patient shared that her PCP Dr. Garner Nash recently doubled her Metformin dosage to 1 gm bid. Gave information regarding outpatient diabetes education.  Thank you, Sonia Morales. Cameo Schmiesing, RN, MSN, CDE Inpatient Glycemic Control Team Team Pager [phone] (8am-5pm) 10/06/2015 8:40 AM

## 2015-10-06 NOTE — Progress Notes (Signed)
CARDIAC REHAB PHASE I   Pt sts she has been walking in room and took a shower last night. Sts she is fine. She is off the monitor. Ed completed with pt and family. Voiced understanding, sts she knows these things. Did not want to discuss smoking cessation however she did take fake cigarette and quitting sheet. Will send referral to [facility]. Understands the importance of Effient. [id number]  Harriet Masson CES, ACSM 10/06/2015 12:11 PM

## 2015-10-06 NOTE — Care Management Note (Signed)
Case Management Note  Patient Details  Name: Sonia Morales MRN: [id number] Date of Birth: June 21, 1955  Subjective/Objective: Pt admitted for [facility]. Pt is from home with family support. Pt is without insurance or PCP. Pt is from [facility]. Information provided to patient about the [facility]. Per pt her daughter will help her get established with the Health Department.                    Action/Plan: CM did check to see if pt has the 30 day free card. Pt husband took the card home. Patient Assistance form provided to pt and she may qualify for year supply free. Pt is aware that samples can be obtained from the MD office if she has not heard back within 3 weeks from th company. No further needs from CM at this time.    Expected Discharge Date:                  Expected Discharge Plan:  Home/Self Care  In-House Referral:     Discharge planning Services  CM Consult, Medication Assistance, Indigent Health Clinic  Post Acute Care Choice:  NA Choice offered to:  NA  DME Arranged:  N/A DME Agency:  NA  HH Arranged:  NA HH Agency:  NA  Status of Service:  Completed, signed off  If discussed at Long Length of Stay Meetings, dates discussed:    Additional Comments:  [name], RN 10/06/2015, 12:18 PM

## 2015-10-06 NOTE — Discharge Summary (Signed)
Physician Discharge Summary  Patient ID: Sonia Morales MRN: [id number] DOB/AGE: February 15, 1956 60 y.o.  Admit date: 10/04/2015 Discharge date: 10/06/2015  Primary Discharge Diagnosis 1. Anterolateral STEMI  Coronary angio: Left main mild calcification. LAD tortuous origin, occluded in the proximal segment. Diagonal 2 is moderate-sized, has proximal 50% stenosis. Mild disease in the mid to distal LAD. S/P thrombectomy followed by DES stenting with overlapping 3.5 x 18 and 3.5 x 12 mm Xience Alpine stents, 100% reduced to 0% TIMI 0 flow improved TIMI-3 flow.  Ramus intermediate has a tortuous origin. Mild disease. Circumflex mild disease. Right coronary artery mild diffuse disease.  2. Diabetes mellitus type 2 uncontrolled 3. Hypertension 4. Hyperlipidemia 5. Chronic pain syndrome, degenerative disk disease involving cervical spine. 6. Right carotid bruit.  Significant Diagnostic Studies: Cardiac Studies:  Coronary angio 10/04/2015: Left main mild calcification. LAD tortuous origin, occluded in the proximal segment. Diagonal 2 is moderate-sized, has proximal 50% stenosis. Mild disease in the mid to distal LAD. S/P thrombectomy followed by DES stenting with overlapping 3.5 x 18 and 3.5 x 12 mm Xience Alpine stents, 100% reduced to 0% TIMI 0 flow improved TIMI-3 flow.  Ramus intermediate has a tortuous origin. Mild disease. Circumflex mild disease. Right coronary artery mild diffuse disease.  Echo 10/05/2015 Left ventricle: The cavity size was normal. There was moderate  concentric hypertrophy. Systolic function was severely reduced.  The estimated ejection fraction was in the range of 20% to 25%.  Moderate Diffuse hypokinesis. Akinesis of the anterior and  anterolateral myocardium. Left ventricular diastolic function parameters were normal. - Left atrium: The atrium was mildly dilated. - Tricuspid valve: There was no significant regurgitation. Unable  to estimate PA pressure due to lack of  TR. - Inferior vena cava: The vessel was dilated. The respirophasic  diameter changes were blunted (< 50%), consistent with elevated  central venous pressure. - Pericardium, extracardiac: A trivial pericardial effusion was identified.  EKG: 10/05/2015: Evolving anterolateral MI.   Hospital Course: patient was admitted to the hospital  With ST elevation myocardial infarctions, was emergently taken to cardiac catheterization lab and underwent complex but successful angioplasty to proximal LAD with implantation of 2 overlapping drug-eluting stents.  She had borderline low blood pressure throughout the hospitalization,LVEF was markedly depressed.  Telemetry did not reveal any significant arrhythmias or frequent PVCs.  Suspect her ejection fraction will improve. Hence she ambulated in the hallway, cardiac rehabilitation had seen her, patient remains asymptomatic and felt stable for discharge.   Recommendations on discharge: she will need cardiac rehabilitation. Patient is motivated in smoking cessation.  Prescribed her to codeine patches. I will see her back in the office in 10 days or so.   I have started her on glyburide 5 mg by mouth twice a day for uncontrolled diabetes mellitus, blood sugars improved. She will resume metformin.  We'll request PCP to follow-up on her diabetes mellitus.  She appears motivated to making lifestyle changes.  She will need outpatient carotid duplex.  Discharge Exam: Blood pressure 104/69, pulse 102, temperature 98.6 F (37 C), temperature source Oral, resp. rate 18, height 5\' 3"  (1.6 m), weight 73.619 kg (162 lb 4.8 oz), SpO2 98 %.   General appearance: alert, cooperative, appears older than stated age, mild distress and Due to pain. Eyes: negative findings: lids and lashes normal Neck: no adenopathy, no carotid bruit, no JVD, supple, symmetrical, trachea midline and thyroid not enlarged, symmetric, no tenderness/mass/nodules Neck: JVP - normal, carotids 2+= without  bruits Resp: clear  to auscultation bilaterally Chest wall: no tenderness Cardio: regular rate and rhythm, S1, S2 normal, no murmur, click, rub or gallop GI: soft, non-tender; bowel sounds normal; no masses, no organomegaly Extremities: extremities normal, atraumatic, no cyanosis or edema Pulses: Right Carotid bruit, femoral pulses normal without bruit, popliteal pulses faint, left dorsalis pedis normal, right 1+. PT could not be felt bilaterally.  Labs:   Lab Results  Component Value Date   WBC 16.2* 10/05/2015   HGB 12.0 10/05/2015   HCT 36.8 10/05/2015   MCV 88.2 10/05/2015   PLT 186 10/05/2015    Recent Labs Lab 10/05/15 0410  NA 137  K 3.8  CL 103  CO2 25  BUN 10  CREATININE 0.56  CALCIUM 9.2  GLUCOSE 259*    Lipid Panel     Component Value Date/Time   CHOL 128 10/04/2015 1814   TRIG 66 10/04/2015 1814   HDL 42 10/04/2015 1814   CHOLHDL 3.0 10/04/2015 1814   VLDL 13 10/04/2015 1814   LDLCALC 73 10/04/2015 1814   HEMOGLOBIN A1C Lab Results  Component Value Date   HGBA1C 10.6* 10/04/2015   MPG 258 10/04/2015    Cardiac Panel (last 3 results)  Recent Labs  10/05/15 1618 10/05/15 2205 10/06/15 0410  TROPONINI 47.25* 35.79* 28.55*     Recent Labs  10/04/15 1814  TSH 0.613    FOLLOW UP PLANS AND APPOINTMENTS    Medication List    ASK your doctor about these medications        aspirin 81 MG tablet  Take 81 mg by mouth daily.     atorvastatin 20 MG tablet  Commonly known as:  LIPITOR  Take 20 mg by mouth daily.     metFORMIN 500 MG tablet  Commonly known as:  GLUCOPHAGE  Take 500 mg by mouth 2 (two) times daily with a meal.     methadone 10 MG tablet  Commonly known as:  DOLOPHINE  Take 10 mg by mouth 3 (three) times daily.     Oxycodone HCl 10 MG Tabs  Take 10-20 mg by mouth every 8 (eight) hours as needed (for pain). For pain     venlafaxine 37.5 MG tablet  Commonly known as:  EFFEXOR  Take 37.5 mg by mouth 2 (two) times  daily as needed (for mood).           Follow-up Information    Follow up with Erling Conte, NP On 10/19/2015.   Specialty:  Nurse Practitioner   Why:  Appointment at 1 pm, come 15 minutes early and bring all medications and any paperwork   Contact information:   [address] [place] [address] [phone]        Yates Decamp, MD 10/06/2015, 10:13 AM  Pager: [phone] Office: [phone] If no answer: [phone]

## 2015-10-11 DIAGNOSIS — I2510 Atherosclerotic heart disease of native coronary artery without angina pectoris: Secondary | ICD-10-CM

## 2015-10-11 HISTORY — DX: Atherosclerotic heart disease of native coronary artery without angina pectoris: I25.10

## 2018-05-12 DIAGNOSIS — M47812 Spondylosis without myelopathy or radiculopathy, cervical region: Secondary | ICD-10-CM | POA: Diagnosis not present

## 2018-06-12 ENCOUNTER — Ambulatory Visit: Payer: Self-pay | Admitting: Cardiology

## 2018-06-12 ENCOUNTER — Encounter: Payer: Self-pay | Admitting: Cardiology

## 2018-06-12 DIAGNOSIS — E7800 Pure hypercholesterolemia, unspecified: Secondary | ICD-10-CM

## 2018-06-12 DIAGNOSIS — IMO0002 Reserved for concepts with insufficient information to code with codable children: Secondary | ICD-10-CM

## 2018-06-12 DIAGNOSIS — E1165 Type 2 diabetes mellitus with hyperglycemia: Secondary | ICD-10-CM

## 2018-06-12 DIAGNOSIS — E118 Type 2 diabetes mellitus with unspecified complications: Secondary | ICD-10-CM | POA: Insufficient documentation

## 2018-06-12 DIAGNOSIS — Z87891 Personal history of nicotine dependence: Secondary | ICD-10-CM

## 2018-06-12 HISTORY — DX: Personal history of nicotine dependence: Z87.891

## 2018-06-12 HISTORY — DX: Type 2 diabetes mellitus with hyperglycemia: E11.65

## 2018-06-12 HISTORY — DX: Reserved for concepts with insufficient information to code with codable children: IMO0002

## 2018-06-12 HISTORY — DX: Pure hypercholesterolemia, unspecified: E78.00

## 2018-06-12 NOTE — Progress Notes (Deleted)
Subjective:  Primary Physician:  Caryl Bis, MD  Patient ID: Sonia Morales, female    DOB: 11/28/1955, 63 y.o.   MRN: [id number]  No chief complaint on file.   HPI: ANACLARA Morales  is a 63 y.o. female  with  uncontrolled diabetes, hyperlipidemia, tobacco use disorder which is completely quit since myocardial infarction on 10/04/2015. She has chronic active pain and neck pain and is on chronic methadone therapy. Since coronary artery disease and anterior MI in 2017 and emergent PCI, EF 35% which has not normalized , she has made significant lifestyle changes and is completely quit smoking and has started to be more active to physically.  Patient presents for 6 month follow-up visit for CAD and ischemic cardiomyopathy. She has stable chronic dyspnea on exertion.   Past Medical History:  Diagnosis Date  . Acute anterior wall MI ([place]) 10/04/2015  . Acute MI anterior wall first episode care [facility]) 10/04/2015   Coronary angiogram 10/04/2015: Occluded proximal LAD, superior takeoff of ramus intermediate, large circumflex, moderate to large RCA with mild diffuse disease. S/P thrombectomy, overlapping stents to the proximal LAD with 3.5 x 18, 3.5 x 12 mm Xience DES, 100% to 0%.   Marland Kitchen CAD (coronary artery disease), native coronary artery 10/11/2015   Coronary angiogram 10/04/2015: Left main mild calcification. LAD tortuous origin, occluded in the proximal segment. Diagonal 2 is moderate-sized, has proximal 50% stenosis. S/P thrombectomy & DES overlapping 3.5 x 18 and 3.5 x 12 mm Xience Alpine stents,   Ramus intermediate has a tortuous origin. Circumflex mild disease. Right coronary artery mild diffuse disease  . DM (diabetes mellitus), type 2, uncontrolled with complications ([place]) 2/72/5366  . H/O tobacco use, presenting hazards to health 06/12/2018   Quit in 2017 after MI  . Hypercholesteremia 06/12/2018    Past Surgical History:  Procedure Laterality Date  . CARDIAC CATHETERIZATION N/A  10/04/2015   Procedure: Left Heart Cath and Coronary Angiography;  Surgeon: Adrian Prows, MD;  Location: [place] [facility];  Service: Cardiovascular;  Laterality: N/A;  . CARDIAC CATHETERIZATION  10/04/2015   Procedure: Coronary Stent Intervention;  Surgeon: Adrian Prows, MD;  Location: [facility];  Service: Cardiovascular;;  . PERIPHERAL VASCULAR CATHETERIZATION  10/04/2015   Procedure: Thrombectomy;  Surgeon: Adrian Prows, MD;  Location: [place] [facility];  Service: Cardiovascular;;    Social History   Socioeconomic History  . Marital status: Married    Spouse name: Not on file  . Number of children: Not on file  . Years of education: Not on file  . Highest education level: Not on file  Occupational History  . Not on file  Social Needs  . Financial resource strain: Not on file  . Food insecurity:    Worry: Not on file    Inability: Not on file  . Transportation needs:    Medical: Not on file    Non-medical: Not on file  Tobacco Use  . Smoking status: Current Every Day Smoker    Packs/day: 1.00    Types: Cigarettes  Substance and Sexual Activity  . Alcohol use: Not on file  . Drug use: Not on file  . Sexual activity: Not on file  Lifestyle  . Physical activity:    Days per week: Not on file    Minutes per session: Not on file  . Stress: Not on file  Relationships  . Social connections:    Talks on phone: Not on file  Gets together: Not on file    Attends religious service: Not on file    Active member of club or organization: Not on file    Attends meetings of clubs or organizations: Not on file    Relationship status: Not on file  . Intimate partner violence:    Fear of current or ex partner: Not on file    Emotionally abused: Not on file    Physically abused: Not on file    Forced sexual activity: Not on file  Other Topics Concern  . Not on file  Social History Narrative  . Not on file    Current Outpatient Medications on File Prior to Visit  Medication  Sig Dispense Refill  . aspirin 81 MG tablet Take 81 mg by mouth daily.    Marland Kitchen atorvastatin (LIPITOR) 80 MG tablet Take 1 tablet (80 mg total) by mouth daily at 6 PM. 30 tablet 0  . glyBURIDE (DIABETA) 5 MG tablet Take 1 tablet (5 mg total) by mouth 2 (two) times daily with a meal. 60 tablet 1  . metFORMIN (GLUCOPHAGE) 500 MG tablet Take 500 mg by mouth 2 (two) times daily with a meal.    . methadone (DOLOPHINE) 10 MG tablet Take 10 mg by mouth 3 (three) times daily.  0  . metoprolol tartrate (LOPRESSOR) 25 MG tablet Take 0.5 tablets (12.5 mg total) by mouth 2 (two) times daily. 60 tablet 0  . nicotine (NICODERM CQ - DOSED IN MG/24 HOURS) 14 mg/24hr patch Place 1 patch (14 mg total) onto the skin daily. 28 patch 0  . Oxycodone HCl 10 MG TABS Take 10-20 mg by mouth every 8 (eight) hours as needed (for pain). For pain  0  . prasugrel (EFFIENT) 10 MG TABS tablet Take 1 tablet (10 mg total) by mouth daily. 30 tablet 0  . venlafaxine (EFFEXOR) 37.5 MG tablet Take 37.5 mg by mouth 2 (two) times daily as needed (for mood).     No current facility-administered medications on file prior to visit.      Review of Systems  Constitutional: Negative for malaise/fatigue and weight loss.  Respiratory: Negative for cough, hemoptysis and shortness of breath.   Cardiovascular: Negative for chest pain, palpitations, claudication and leg swelling.  Gastrointestinal: Negative for abdominal pain, blood in stool, constipation, heartburn and vomiting.  Genitourinary: Negative for dysuria.  Musculoskeletal: Positive for back pain and neck pain. Negative for joint pain and myalgias.  Neurological: Negative for dizziness, focal weakness and headaches.  Endo/Heme/Allergies: Does not bruise/bleed easily.  Psychiatric/Behavioral: Negative for depression. The patient is not nervous/anxious.   All other systems reviewed and are negative.      Objective:  There were no vitals taken for this visit. There is no height or  weight on file to calculate BMI.  Physical Exam  Constitutional: She appears well-developed. No distress.  Mildly obese  HENT:  Head: Atraumatic.  Eyes: Conjunctivae are normal.  Neck: Neck supple. No JVD present. No thyromegaly present.  Cardiovascular: Normal rate, regular rhythm, normal heart sounds and intact distal pulses. Exam reveals no gallop.  No murmur heard. Pulmonary/Chest: Effort normal and breath sounds normal.  Abdominal: Soft. Bowel sounds are normal.  Musculoskeletal: Normal range of motion.        General: No edema.  Neurological: She is alert.  Skin: Skin is warm and dry.  Psychiatric: She has a normal mood and affect.    CARDIAC STUDIES:    Coronary angiogram 10/04/2015: Left main mild  calcification. LAD tortuous origin, occluded in the proximal segment. Diagonal 2 is moderate-sized, has proximal 50% stenosis. S/P thrombectomy & DES overlapping 3.5 x 18 and 3.5 x 12 mm Xience Alpine stents,   Ramus intermediate has a tortuous origin. Circumflex mild disease. Right coronary artery mild diffuse disease.  Echocardiogram 06/12/2017: Left ventricle cavity is normal in size.  Inferoseptal, apical akinesis.  Doppler evidence of grade II (pseudonormal) diastolic dysfunction, elevated LAP. Calculated EF 52%. Left atrial cavity is mildly dilated. Mild (Grade I) mitral regurgitation. Inadequate tricuspid regurgitation jet to estimate pulmonary artery pressure Estimated RA pressure 8 mmHg. Pericardial fat pad seen. Compared to previous study dated 01/11/2016, LVEF has improved.  Carotid artery duplex 10/26/2015: Minimal mixed plaque both carotid arteries without significant stenosis. Antegrade bilateral vertebral flow. Assessment & Recommendations:   1. Coronary artery disease involving native coronary artery of native heart without angina pectoris Coronary angiogram 10/04/2015: Prox LAD S/P thrombectomy & DES overlapping 3.5 x 18 and 3.5 x 12 mm Xience Alpine stents,     EKG 05/30/2017: Normal sinus rhythm at 80 bpm, left axis deviation, normal interval, anterior infarct old, anterolateral T wave inversion.  Compared to previous EKG 07/09/2016.  2. DM (diabetes mellitus), type 2, uncontrolled with complications ([facility])  3. Hypercholesteremia  4. H/O tobacco use, presenting hazards to health Quit in 2017 and abstinant  Laboratory exam:   10/15/2017: Hemoglobin A1c 11.4%.  Creatinine 0.79, EGFR 82/94, potassium 4.8, CMP normal.  Cholesterol 132, triglycerides 153, HDL 44, LDL 57.  10/04/2015: Total cholesterol 128, triglycerides 66, HDL 42, LDL 73, TSH 0.613, WBC 17, CBC otherwise normal, HbA1c 10.6%, glucose 259, creatinine 0.56, potassium 3.8   Recommendation: ***  Adrian Prows, MD, [facility] 06/12/2018, 6:03 AM [facility] [place] Pager: [phone] Office: [phone] If no answer Cell [phone]

## 2018-07-03 ENCOUNTER — Telehealth: Payer: Self-pay

## 2018-07-08 ENCOUNTER — Ambulatory Visit: Payer: Self-pay | Admitting: Cardiology

## 2018-07-23 DIAGNOSIS — M47812 Spondylosis without myelopathy or radiculopathy, cervical region: Secondary | ICD-10-CM | POA: Diagnosis not present

## 2018-07-23 DIAGNOSIS — M4712 Other spondylosis with myelopathy, cervical region: Secondary | ICD-10-CM | POA: Diagnosis not present

## 2018-08-15 ENCOUNTER — Ambulatory Visit: Payer: Self-pay | Admitting: Cardiology

## 2018-08-26 ENCOUNTER — Encounter: Payer: Self-pay | Admitting: Cardiology

## 2018-08-27 ENCOUNTER — Ambulatory Visit (INDEPENDENT_AMBULATORY_CARE_PROVIDER_SITE_OTHER): Payer: Medicare Other | Admitting: Cardiology

## 2018-08-27 ENCOUNTER — Encounter: Payer: Self-pay | Admitting: Cardiology

## 2018-08-27 ENCOUNTER — Other Ambulatory Visit: Payer: Self-pay

## 2018-08-27 VITALS — Ht 63.0 in | Wt 187.0 lb

## 2018-08-27 DIAGNOSIS — I2510 Atherosclerotic heart disease of native coronary artery without angina pectoris: Secondary | ICD-10-CM

## 2018-08-27 DIAGNOSIS — I255 Ischemic cardiomyopathy: Secondary | ICD-10-CM

## 2018-08-27 DIAGNOSIS — I10 Essential (primary) hypertension: Secondary | ICD-10-CM | POA: Diagnosis not present

## 2018-08-27 DIAGNOSIS — E7800 Pure hypercholesterolemia, unspecified: Secondary | ICD-10-CM

## 2018-08-27 NOTE — Progress Notes (Addendum)
Virtual Visit via Video Note: This visit type was conducted due to national recommendations for restrictions regarding the COVID-19 Pandemic (e.g. social distancing).  This format is felt to be most appropriate for this patient at this time.  All issues noted in this document were discussed and addressed.  No physical exam was performed (except for noted visual exam findings with Telehealth visits).  The patient has consented to conduct a Telehealth visit and understands insurance will be billed.   I connected with@, on 08/27/18 at  by a video enabled telemedicine application and verified that I am speaking with the correct person using two identifiers.   I discussed the limitations of evaluation and management by telemedicine and the availability of in person appointments. The patient expressed understanding and agreed to proceed.   I have discussed with patient regarding the safety during COVID Pandemic and steps and precautions to be taken including social distancing, frequent hand wash and use of detergent soap, gels with the patient. I asked the patient to avoid touching mouth, nose, eyes, ears with the hands. I encouraged regular walking around the neighborhood and exercise and regular diet, as long as social distancing can be maintained.  Primary Physician/Referring:  Caryl Bis, MD  Patient ID: Sonia Morales, female    DOB: 1955/11/17, 63 y.o.   MRN: [id number]  Chief Complaint  Patient presents with  . Congestive Heart Failure    6 month f/u    HPI: Sonia Morales  is a 63 y.o. female  with Patient presents for 6 month follow-up visit for CAD and ischemic cardiomyopathy.  She has stable chronic dyspnea on exertion.   Patient presents for 12 month follow-up visit for CAD and ischemic cardiomyopathy.  She has stable chronic dyspnea on exertion. She still is depressed about her mom's death since a year ago. She has remained abstinant from smoking cigarettes. Her DM is uncontrolled  but since being on insulin, BS is better but has had several hypoglycemic episodes.  She has not had chest pain or palpitations. No syncope.    Past Medical History:  Diagnosis Date  . Acute anterior wall MI ([place]) 10/04/2015  . Acute MI anterior wall first episode care [facility]) 10/04/2015   Coronary angiogram 10/04/2015: Occluded proximal LAD, superior takeoff of ramus intermediate, large circumflex, moderate to large RCA with mild diffuse disease. S/P thrombectomy, overlapping stents to the proximal LAD with 3.5 x 18, 3.5 x 12 mm Xience DES, 100% to 0%.   Marland Kitchen CAD (coronary artery disease), native coronary artery 10/11/2015   Coronary angiogram 10/04/2015: Left main mild calcification. LAD tortuous origin, occluded in the proximal segment. Diagonal 2 is moderate-sized, has proximal 50% stenosis. S/P thrombectomy & DES overlapping 3.5 x 18 and 3.5 x 12 mm Xience Alpine stents,   Ramus intermediate has a tortuous origin. Circumflex mild disease. Right coronary artery mild diffuse disease  . DM (diabetes mellitus), type 2, uncontrolled with complications ([place]) 5/46/2703  . H/O tobacco use, presenting hazards to health 06/12/2018   Quit in 2017 after MI  . Hypercholesteremia 06/12/2018    Past Surgical History:  Procedure Laterality Date  . CARDIAC CATHETERIZATION N/A 10/04/2015   Procedure: Left Heart Cath and Coronary Angiography;  Surgeon: Adrian Prows, MD;  Location: [place] [facility];  Service: Cardiovascular;  Laterality: N/A;  . CARDIAC CATHETERIZATION  10/04/2015   Procedure: Coronary Stent Intervention;  Surgeon: Adrian Prows, MD;  Location: [place] [facility];  Service: Cardiovascular;;  . PERIPHERAL  VASCULAR CATHETERIZATION  10/04/2015   Procedure: Thrombectomy;  Surgeon: Adrian Prows, MD;  Location: [place] [facility];  Service: Cardiovascular;;    Social History   Socioeconomic History  . Marital status: Married    Spouse name: Not on file  . Number of children: 2  . Years of education: Not on file   . Highest education level: Not on file  Occupational History  . Not on file  Social Needs  . Financial resource strain: Not on file  . Food insecurity:    Worry: Not on file    Inability: Not on file  . Transportation needs:    Medical: Not on file    Non-medical: Not on file  Tobacco Use  . Smoking status: Former Smoker    Packs/day: 1.00    Types: Cigarettes  . Smokeless tobacco: Never Used  Substance and Sexual Activity  . Alcohol use: Never    Frequency: Never  . Drug use: Never  . Sexual activity: Not on file  Lifestyle  . Physical activity:    Days per week: Not on file    Minutes per session: Not on file  . Stress: Not on file  Relationships  . Social connections:    Talks on phone: Not on file    Gets together: Not on file    Attends religious service: Not on file    Active member of club or organization: Not on file    Attends meetings of clubs or organizations: Not on file    Relationship status: Not on file  . Intimate partner violence:    Fear of current or ex partner: Not on file    Emotionally abused: Not on file    Physically abused: Not on file    Forced sexual activity: Not on file  Other Topics Concern  . Not on file  Social History Narrative  . Not on file    Review of Systems  Cardiovascular: Positive for dyspnea on exertion. Negative for chest pain and claudication.  Musculoskeletal: Positive for back pain, joint pain and neck pain.  Gastrointestinal: Positive for constipation. Negative for change in bowel habit.  Psychiatric/Behavioral: Positive for depression.      Objective  Height 5' 3"  (1.6 m), weight 187 lb (84.8 kg). Body mass index is 33.13 kg/m.  Physical exam not performed or limited due to virtual visit.  Patient appeared to be in no distress, Neck was supple, respiration was not labored.  Please see exam details from prior visit is as below.    Physical Exam  Constitutional: She appears well-developed. No distress.   Mildly obese  HENT:  Head: Atraumatic.  Eyes: Conjunctivae are normal.  Neck: Neck supple. No JVD present. No thyromegaly present.  Cardiovascular: Normal rate, regular rhythm, normal heart sounds and intact distal pulses. Exam reveals no gallop.  No murmur heard. Pulses:      Carotid pulses are on the right side with bruit. No edema  Pulmonary/Chest: Effort normal and breath sounds normal.  Abdominal: Soft. Bowel sounds are normal.  Musculoskeletal: Normal range of motion.  Neurological: She is alert.  Skin: Skin is warm and dry.  Psychiatric: She has a normal mood and affect.    Radiology: No results found.  Laboratory examination:   10/15/2017: Hemoglobin A1c 11.4%.  Creatinine 0.79, EGFR 82/94, potassium 4.8, CMP normal.  Cholesterol 132, triglycerides 153, HDL 44, LDL 57.  10/04/2015: Total cholesterol 128, triglycerides 66, HDL 42, LDL 73, TSH 0.613, WBC 17, CBC  otherwise normal, HbA1c 10.6%, glucose 259, creatinine 0.56, potassium 3.8  CMP Latest Ref Rng & Units 10/05/2015 10/04/2015  Glucose 65 - 99 mg/dL 259(H) -  BUN 6 - 20 mg/dL 10 -  Creatinine 0.44 - 1.00 mg/dL 0.56 0.54  Sodium 135 - 145 mmol/L 137 -  Potassium 3.5 - 5.1 mmol/L 3.8 -  Chloride 101 - 111 mmol/L 103 -  CO2 22 - 32 mmol/L 25 -  Calcium 8.9 - 10.3 mg/dL 9.2 -   CBC Latest Ref Rng & Units 10/05/2015 10/04/2015  WBC 4.0 - 10.5 K/uL 16.2(H) 17.0(H)  Hemoglobin 12.0 - 15.0 g/dL 12.0 12.1  Hematocrit 36.0 - 46.0 % 36.8 36.7  Platelets 150 - 400 K/uL 186 197   Lipid Panel     Component Value Date/Time   CHOL 128 10/04/2015 1814   TRIG 66 10/04/2015 1814   HDL 42 10/04/2015 1814   CHOLHDL 3.0 10/04/2015 1814   VLDL 13 10/04/2015 1814   LDLCALC 73 10/04/2015 1814   HEMOGLOBIN A1C Lab Results  Component Value Date   HGBA1C 10.6 (H) 10/04/2015   MPG 258 10/04/2015   TSH No results for input(s): TSH in the last 8760 hours.  PRN Meds:. Medications Discontinued During This Encounter  Medication  Reason  . metFORMIN (GLUCOPHAGE) 500 MG tablet   . glyBURIDE (DIABETA) 5 MG tablet   . nicotine (NICODERM CQ - DOSED IN MG/24 HOURS) 14 mg/24hr patch Completed Course  . prasugrel (EFFIENT) 10 MG TABS tablet Completed Course   Current Meds  Medication Sig  . aspirin 81 MG tablet Take 81 mg by mouth daily.  Marland Kitchen atorvastatin (LIPITOR) 80 MG tablet Take 1 tablet (80 mg total) by mouth daily at 6 PM.  . carvedilol (COREG) 6.25 MG tablet Take 6.25 mg by mouth 2 (two) times daily.  Marland Kitchen FLUoxetine (PROZAC) 40 MG capsule Take 40 mg by mouth daily.  . insulin aspart protamine- aspart (NOVOLOG MIX 70/30) (70-30) 100 UNIT/ML injection Inject 50 Units into the skin 2 (two) times daily.  . methadone (DOLOPHINE) 10 MG tablet Take 10 mg by mouth 3 (three) times daily.  . metoprolol tartrate (LOPRESSOR) 25 MG tablet Take 0.5 tablets (12.5 mg total) by mouth 2 (two) times daily.  . Oxycodone HCl 10 MG TABS Take 10-20 mg by mouth every 8 (eight) hours as needed (for pain). For pain  . phentermine (ADIPEX-P) 37.5 MG tablet Take 37.5 mg by mouth daily.  . sacubitril-valsartan (ENTRESTO) 97-103 MG Take 1 tablet by mouth 2 (two) times daily.  . traZODone (DESYREL) 50 MG tablet Take 50 mg by mouth at bedtime.   Marland Kitchen venlafaxine (EFFEXOR) 37.5 MG tablet Take 37.5 mg by mouth 2 (two) times daily as needed (for mood).  . [DISCONTINUED] nicotine (NICODERM CQ - DOSED IN MG/24 HOURS) 14 mg/24hr patch Place 1 patch (14 mg total) onto the skin daily.  . [DISCONTINUED] prasugrel (EFFIENT) 10 MG TABS tablet Take 1 tablet (10 mg total) by mouth daily.    Cardiac Studies:   Coronary angiogram 10/04/2015:  Left main mild calcification. LAD tortuous origin, occluded in the proximal segment. Diagonal 2 is moderate-sized, has proximal 50% stenosis. S/P thrombectomy & DES overlapping 3.5 x 18 and 3.5 x 12 mm Xience Alpine stents, 100% reduced to 0%. Ramus intermediate has a tortuous origin. Circumflex mild disease. Right coronary artery  mild diffuse disease. Markedly elevated LVEDP, 31 mmHg.  Echocardiogram 06/12/2017: Left ventricle cavity is normal in size. Inferoseptal, apical akinesis. Doppler evidence of  grade II (pseudonormal) diastolic dysfunction, elevated LAP. Calculated EF 52%. Left atrial cavity is mildly dilated. Mild (Grade I) mitral regurgitation. Inadequate tricuspid regurgitation jet to estimate pulmonary artery pressure Estimated RA pressure 8 mmHg. Pericardial fat pad seen. Compared to previous study dated 01/11/2016, LVEF has improved.  Carotid artery duplex 10/26/2015: Minimal mixed plaque both carotid arteries without significant stenosis. Antegrade bilateral vertebral flow.  Assessment   Coronary artery disease involving native coronary artery of native heart without angina pectoris: 10/04/15: 3.5 x 18 & 3.5 x 12 mm Xience for anterior MI.  Ischemic cardiomyopathy  Essential hypertension  Hypercholesteremia   EKG 05/30/2017: Normal sinus rhythm at 80 bpm, left axis deviation, normal interval, anterior infarct old, anterolateral T wave inversion. Compared to previous EKG 07/09/2016.  Recommendations:   Patient is here on annual visit and follow-up of ischemic heart myopathy which is now resolved and ejection fraction is normalized.  She is on appropriate and CAD and elected medical therapy.  She has not had any recurrence of angina pectoris.  I do not have recent lipids, patient states that her blood pressure has been well-controlled but doesn't remember the numbers.  In view of her extremely high risk, I would like to see her in the office/clinic visit in 2 months. She does have chronic dyspnea, she has noticed mild worsening recently, probably related to deconditioning in view of Covid 19 and prior history of smoking.  I will consider repeating echocardiogram.  She is remained abstinent from tobacco since MI, congratulated her.  She is taking phentermine for weight loss, she can continue this as  long as it is short-term only in view of increased cardiovascular risks associated with its use.  Fortunately she has not had any angina and hypertension has been well-controlled per patient.  Patient states that since being on insulin, diabetes is improving however she is having frequent episodes of hypoglycemia.  Advised her to use glucose pills as a p.r.n. basis, also to discuss with her PCP regarding dosing of her insulin.  I'll try to obtain the results of the recently performed labs from my evaluation.  Adrian Prows, MD, [facility] [place] 08/27/2018, 4:59 PM [facility] [place] Pager: [phone] Office: [phone] If no answer Cell [phone]

## 2018-11-03 ENCOUNTER — Encounter: Payer: Self-pay | Admitting: Cardiology

## 2018-11-03 ENCOUNTER — Other Ambulatory Visit: Payer: Self-pay

## 2018-11-03 ENCOUNTER — Ambulatory Visit (INDEPENDENT_AMBULATORY_CARE_PROVIDER_SITE_OTHER): Payer: Medicare Other | Admitting: Cardiology

## 2018-11-03 VITALS — Ht 65.0 in | Wt 175.0 lb

## 2018-11-03 DIAGNOSIS — R0609 Other forms of dyspnea: Secondary | ICD-10-CM

## 2018-11-03 DIAGNOSIS — I255 Ischemic cardiomyopathy: Secondary | ICD-10-CM | POA: Diagnosis not present

## 2018-11-03 DIAGNOSIS — E7800 Pure hypercholesterolemia, unspecified: Secondary | ICD-10-CM | POA: Diagnosis not present

## 2018-11-03 DIAGNOSIS — I2510 Atherosclerotic heart disease of native coronary artery without angina pectoris: Secondary | ICD-10-CM

## 2018-11-03 NOTE — Progress Notes (Signed)
Virtual Visit via Video Note: This visit type was conducted due to national recommendations for restrictions regarding the COVID-19 Pandemic (e.g. social distancing).  This format is felt to be most appropriate for this patient at this time.  All issues noted in this document were discussed and addressed.  No physical exam was performed (except for noted visual exam findings with Telehealth visits).  The patient has consented to conduct a Telehealth visit and understands insurance will be billed.   I connected with@, on 11/03/18 at  by a video enabled telemedicine application and verified that I am speaking with the correct person using two identifiers.   I discussed the limitations of evaluation and management by telemedicine and the availability of in person appointments. The patient expressed understanding and agreed to proceed.   I have discussed with patient regarding the safety during COVID Pandemic and steps and precautions to be taken including social distancing, frequent hand wash and use of detergent soap, gels with the patient. I asked the patient to avoid touching mouth, nose, eyes, ears with the hands. I encouraged regular walking around the neighborhood and exercise and regular diet, as long as social distancing can be maintained.  Primary Physician/Referring:  Caryl Bis, MD  Patient ID: Sonia Morales, female    DOB: 05/22/1955, 63 y.o.   MRN: [id number]  Chief Complaint  Patient presents with  . Coronary Artery Disease  . Hyperlipidemia  . Cardiomyopathy  . Follow-up    68mo   HPI: Sonia Morales is a 63y.o. female  with Patient presents for 6 month follow-up visit for CAD and ischemic cardiomyopathy.  She has stable chronic dyspnea on exertion.   Patient presents for  2 month follow-up visit for CAD and ischemic cardiomyopathy. I had seen her on a virtual visit 2 months ago, due to worsening dyspnea, I set this up.  She continues to endorse marked dyspnea on  exertion, and also has noticed fatigue.  She is presently taking phentermine and has lost 10 pounds in weight over the past 2 months.  She is wondering whether she needs a stress test or a cardiac catheterization.    She has remained abstinant from smoking cigarettes. Her DM is uncontrolled but since being on insulin, BS is better but has had several hypoglycemic episodes.  She has not had chest pain or palpitations. No syncope.   Past Medical History:  Diagnosis Date  . [place]  . Acute MI anterior wall first episode care ([facility] 10/04/2015   Coronary angiogram 10/04/2015: Occluded proximal LAD, superior takeoff of ramus intermediate, large circumflex, moderate to large RCA with mild diffuse disease. S/P thrombectomy, overlapping stents to the proximal LAD with 3.5 x 18, 3.5 x 12 mm Xience DES, 100% to 0%.   .Marland KitchenCAD (coronary artery disease), native coronary artery 10/11/2015   Coronary angiogram 10/04/2015: Left main mild calcification. LAD tortuous origin, occluded in the proximal segment. Diagonal 2 is moderate-sized, has proximal 50% stenosis. S/P thrombectomy & DES overlapping 3.5 x 18 and 3.5 x 12 mm Xience Alpine stents,   Ramus intermediate has a tortuous origin. Circumflex mild disease. Right coronary artery mild diffuse disease  . DM (diabetes mellitus), type 2, uncontrolled with complications ([facility] 28/[place]/6606 . H/O tobacco use, presenting hazards to health 06/12/2018   Quit in 2017 after MI  . Hypercholesteremia 06/12/2018    Past Surgical History:  Procedure Laterality Date  . CARDIAC CATHETERIZATION N/A 10/04/2015  Procedure: Left Heart Cath and Coronary Angiography;  Surgeon: Adrian Prows, MD;  Location: [place] [facility];  Service: Cardiovascular;  Laterality: N/A;  . CARDIAC CATHETERIZATION  10/04/2015   Procedure: Coronary Stent Intervention;  Surgeon: Adrian Prows, MD;  Location: [place] [facility];  Service: Cardiovascular;;  . PERIPHERAL VASCULAR  CATHETERIZATION  10/04/2015   Procedure: Thrombectomy;  Surgeon: Adrian Prows, MD;  Location: [place] [facility];  Service: Cardiovascular;;    Social History   Socioeconomic History  . Marital status: Married    Spouse name: Not on file  . Number of children: 2  . Years of education: Not on file  . Highest education level: Not on file  Occupational History  . Not on file  Social Needs  . Financial resource strain: Not on file  . Food insecurity    Worry: Not on file    Inability: Not on file  . Transportation needs    Medical: Not on file    Non-medical: Not on file  Tobacco Use  . Smoking status: Former Smoker    Packs/day: 1.00    Years: 30.00    Pack years: 30.00    Types: Cigarettes    Quit date: 10/04/2018    Years since quitting: 0.0  . Smokeless tobacco: Never Used  Substance and Sexual Activity  . Alcohol use: Never    Frequency: Never  . Drug use: Never  . Sexual activity: Not on file  Lifestyle  . Physical activity    Days per week: Not on file    Minutes per session: Not on file  . Stress: Not on file  Relationships  . Social Herbalist on phone: Not on file    Gets together: Not on file    Attends religious service: Not on file    Active member of club or organization: Not on file    Attends meetings of clubs or organizations: Not on file    Relationship status: Not on file  . Intimate partner violence    Fear of current or ex partner: Not on file    Emotionally abused: Not on file    Physically abused: Not on file    Forced sexual activity: Not on file  Other Topics Concern  . Not on file  Social History Narrative  . Not on file   Review of Systems  Constitution: Positive for malaise/fatigue and weight loss (10 Lbs with phenteramine since May 2020). Negative for chills, decreased appetite and weight gain.  Cardiovascular: Positive for dyspnea on exertion. Negative for chest pain, claudication, leg swelling and syncope.  Endocrine:  Negative for cold intolerance.  Hematologic/Lymphatic: Does not bruise/bleed easily.  Musculoskeletal: Positive for back pain, joint pain and neck pain. Negative for joint swelling.  Gastrointestinal: Positive for constipation. Negative for abdominal pain, anorexia, change in bowel habit, hematochezia and melena.  Neurological: Negative for headaches and light-headedness.  Psychiatric/Behavioral: Positive for depression. Negative for substance abuse.  All other systems reviewed and are negative.  Objective  Height 5' 5" (1.651 m), weight 175 lb (79.4 kg). Body mass index is 29.12 kg/m.  Physical exam not performed or limited due to virtual visit.  Patient appeared to be in no distress, Neck was supple, respiration was not labored.  Please see exam details from prior visit is as below.    Physical Exam  Constitutional: She appears well-developed. No distress.  Mildly obese  HENT:  Head: Atraumatic.  Eyes: Conjunctivae are normal.  Neck: Neck supple. No JVD present. No thyromegaly present.  Cardiovascular: Normal rate, regular rhythm, normal heart sounds and intact distal pulses. Exam reveals no gallop.  No murmur heard. Pulses:      Carotid pulses are on the right side with bruit. No edema  Pulmonary/Chest: Effort normal and breath sounds normal.  Abdominal: Soft. Bowel sounds are normal.  Musculoskeletal: Normal range of motion.  Neurological: She is alert.  Skin: Skin is warm and dry.  Psychiatric: She has a normal mood and affect.    Radiology: No results found.  Laboratory examination:   10/15/2017: Hemoglobin A1c 11.4%.  Creatinine 0.79, EGFR 82/94, potassium 4.8, CMP normal.  Cholesterol 132, triglycerides 153, HDL 44, LDL 57.  10/04/2015: Total cholesterol 128, triglycerides 66, HDL 42, LDL 73, TSH 0.613, WBC 17, CBC otherwise normal, HbA1c 10.6%, glucose 259, creatinine 0.56, potassium 3.8  CMP Latest Ref Rng & Units 10/05/2015 10/04/2015  Glucose 65 - 99 mg/dL 259(H) -   BUN 6 - 20 mg/dL 10 -  Creatinine 0.44 - 1.00 mg/dL 0.56 0.54  Sodium 135 - 145 mmol/L 137 -  Potassium 3.5 - 5.1 mmol/L 3.8 -  Chloride 101 - 111 mmol/L 103 -  CO2 22 - 32 mmol/L 25 -  Calcium 8.9 - 10.3 mg/dL 9.2 -   CBC Latest Ref Rng & Units 10/05/2015 10/04/2015  WBC 4.0 - 10.5 K/uL 16.2(H) 17.0(H)  Hemoglobin 12.0 - 15.0 g/dL 12.0 12.1  Hematocrit 36.0 - 46.0 % 36.8 36.7  Platelets 150 - 400 K/uL 186 197   Lipid Panel     Component Value Date/Time   CHOL 128 10/04/2015 1814   TRIG 66 10/04/2015 1814   HDL 42 10/04/2015 1814   CHOLHDL 3.0 10/04/2015 1814   VLDL 13 10/04/2015 1814   LDLCALC 73 10/04/2015 1814   HEMOGLOBIN A1C Lab Results  Component Value Date   HGBA1C 10.6 (H) 10/04/2015   MPG 258 10/04/2015   TSH No results for input(s): TSH in the last 8760 hours.  PRN Meds:. Medications Discontinued During This Encounter  Medication Reason  . venlafaxine (EFFEXOR) 37.5 MG tablet Discontinued by provider  . Oxycodone HCl 10 MG TABS Completed Course  . metoprolol tartrate (LOPRESSOR) 25 MG tablet Discontinued by provider   Current Meds  Medication Sig  . aspirin 81 MG tablet Take 81 mg by mouth daily.  Marland Kitchen atorvastatin (LIPITOR) 80 MG tablet Take 1 tablet (80 mg total) by mouth daily at 6 PM.  . carvedilol (COREG) 6.25 MG tablet Take 6.25 mg by mouth 2 (two) times daily.  Marland Kitchen FLUoxetine (PROZAC) 40 MG capsule Take 40 mg by mouth daily.  . insulin aspart protamine- aspart (NOVOLOG MIX 70/30) (70-30) 100 UNIT/ML injection Inject 50 Units into the skin 2 (two) times daily.  . methadone (DOLOPHINE) 10 MG tablet Take 10 mg by mouth daily.   . phentermine (ADIPEX-P) 37.5 MG tablet Take 37.5 mg by mouth daily.  . sacubitril-valsartan (ENTRESTO) 97-103 MG Take 1 tablet by mouth 2 (two) times daily.  . traZODone (DESYREL) 50 MG tablet Take 50 mg by mouth at bedtime.   . [DISCONTINUED] metoprolol tartrate (LOPRESSOR) 25 MG tablet Take 0.5 tablets (12.5 mg total) by mouth 2  (two) times daily.  . [DISCONTINUED] Oxycodone HCl 10 MG TABS Take 10-20 mg by mouth every 8 (eight) hours as needed (for pain). For pain  . [DISCONTINUED] venlafaxine (EFFEXOR) 37.5 MG tablet Take 37.5 mg by mouth 2 (two) times daily as needed (for mood).  Cardiac Studies:   Coronary angiogram 10/04/2015:  Left main mild calcification. LAD tortuous origin, occluded in the proximal segment. Diagonal 2 is moderate-sized, has proximal 50% stenosis. S/P thrombectomy & DES overlapping 3.5 x 18 and 3.5 x 12 mm Xience Alpine stents, 100% reduced to 0%. Ramus intermediate has a tortuous origin. Circumflex mild disease. Right coronary artery mild diffuse disease. Markedly elevated LVEDP, 31 mmHg.  Echocardiogram 06/12/2017: Left ventricle cavity is normal in size. Inferoseptal, apical akinesis. Doppler evidence of grade II (pseudonormal) diastolic dysfunction, elevated LAP. Calculated EF 52%. Left atrial cavity is mildly dilated. Mild (Grade I) mitral regurgitation. Inadequate tricuspid regurgitation jet to estimate pulmonary artery pressure Estimated RA pressure 8 mmHg. Pericardial fat pad seen. Compared to previous study dated 01/11/2016, LVEF has improved.  Carotid artery duplex 10/26/2015: Minimal mixed plaque both carotid arteries without significant stenosis. Antegrade bilateral vertebral flow.  Assessment     ICD-10-CM   1. Coronary artery disease involving native coronary artery of native heart without angina pectoris: 10/04/15: 3.5 x 18 & 3.5 x 12 mm Xience for anterior MI.  I25.10 PCV MYOCARDIAL PERFUSION WITH LEXISCAN  2. Ischemic cardiomyopathy  I25.5   3. Dyspnea on exertion  R06.09 PCV MYOCARDIAL PERFUSION WITH LEXISCAN  4. Hypercholesteremia  E78.00    EKG 05/30/2017: Normal sinus rhythm at 80 bpm, left axis deviation, normal interval, anterior infarct old, anterolateral T wave inversion. Compared to previous EKG 07/09/2016.  Recommendations:   Patient was seen by me on  08/27/2018 on a virtual visit, she was complaining of worsening dyspnea on exertion. She also has noticed reduced exercise capacity  She does have indeed coronary artery disease and has not had further cardiac work-up since angioplasty in June 2017.  In view of patient having had LAD disease and she has reduced her physical activity significantly, we will set her up for a Lexiscan Myoview stress test and if negative for ischemia, she should have pulmonary evaluation.  Her cardiac risk factors include hypertension, hyperlipidemia, diabetes mellitus which is improving.  Unless the stress test is abnormal, I will see her back in 6 months.  Last echocardiogram in February 2019 and reviewed no evidence of pulmonary hypertension and has normal LVEF.  I suspect underlying COPD to be the etiology for dyspnea, fortunately she has remained abstinent from tobacco.  Adrian Prows, MD, [facility] 11/03/2018, 3:48 PM [facility] [place] Pager: [phone] Office: [phone] If no answer Cell [phone]

## 2018-11-06 DIAGNOSIS — Z79891 Long term (current) use of opiate analgesic: Secondary | ICD-10-CM | POA: Diagnosis not present

## 2018-11-06 DIAGNOSIS — G894 Chronic pain syndrome: Secondary | ICD-10-CM | POA: Diagnosis not present

## 2018-11-10 DIAGNOSIS — M79671 Pain in right foot: Secondary | ICD-10-CM | POA: Diagnosis not present

## 2018-11-10 DIAGNOSIS — M79642 Pain in left hand: Secondary | ICD-10-CM | POA: Diagnosis not present

## 2018-11-10 DIAGNOSIS — Z6832 Body mass index (BMI) 32.0-32.9, adult: Secondary | ICD-10-CM | POA: Diagnosis not present

## 2018-11-10 DIAGNOSIS — E782 Mixed hyperlipidemia: Secondary | ICD-10-CM | POA: Diagnosis not present

## 2018-11-10 DIAGNOSIS — E1165 Type 2 diabetes mellitus with hyperglycemia: Secondary | ICD-10-CM | POA: Diagnosis not present

## 2018-11-10 DIAGNOSIS — F331 Major depressive disorder, recurrent, moderate: Secondary | ICD-10-CM | POA: Diagnosis not present

## 2018-11-10 DIAGNOSIS — E1142 Type 2 diabetes mellitus with diabetic polyneuropathy: Secondary | ICD-10-CM | POA: Diagnosis not present

## 2018-11-10 DIAGNOSIS — I2510 Atherosclerotic heart disease of native coronary artery without angina pectoris: Secondary | ICD-10-CM | POA: Diagnosis not present

## 2018-11-21 ENCOUNTER — Other Ambulatory Visit: Payer: Self-pay | Admitting: Cardiology

## 2018-12-01 ENCOUNTER — Other Ambulatory Visit: Payer: Medicare Other

## 2018-12-15 DIAGNOSIS — E782 Mixed hyperlipidemia: Secondary | ICD-10-CM | POA: Diagnosis not present

## 2018-12-15 DIAGNOSIS — E1165 Type 2 diabetes mellitus with hyperglycemia: Secondary | ICD-10-CM | POA: Diagnosis not present

## 2018-12-29 ENCOUNTER — Ambulatory Visit (INDEPENDENT_AMBULATORY_CARE_PROVIDER_SITE_OTHER): Payer: Medicare Other

## 2018-12-29 ENCOUNTER — Other Ambulatory Visit: Payer: Self-pay

## 2018-12-29 DIAGNOSIS — R0609 Other forms of dyspnea: Secondary | ICD-10-CM

## 2018-12-29 DIAGNOSIS — I2510 Atherosclerotic heart disease of native coronary artery without angina pectoris: Secondary | ICD-10-CM | POA: Diagnosis not present

## 2018-12-30 NOTE — Progress Notes (Signed)
Called pt to inform her about stress test. Pt understood and will need an OV with Dr. Einar Gip.

## 2018-12-31 ENCOUNTER — Encounter (HOSPITAL_COMMUNITY): Payer: Self-pay

## 2018-12-31 ENCOUNTER — Encounter: Payer: Self-pay | Admitting: Vascular Surgery

## 2019-01-07 ENCOUNTER — Encounter: Payer: Self-pay | Admitting: Cardiology

## 2019-01-07 ENCOUNTER — Ambulatory Visit (INDEPENDENT_AMBULATORY_CARE_PROVIDER_SITE_OTHER): Payer: Medicare Other | Admitting: Cardiology

## 2019-01-07 ENCOUNTER — Other Ambulatory Visit: Payer: Self-pay

## 2019-01-07 VITALS — BP 129/70 | HR 102 | Ht 60.0 in | Wt 181.0 lb

## 2019-01-07 DIAGNOSIS — R0609 Other forms of dyspnea: Secondary | ICD-10-CM | POA: Diagnosis not present

## 2019-01-07 DIAGNOSIS — I10 Essential (primary) hypertension: Secondary | ICD-10-CM

## 2019-01-07 DIAGNOSIS — R9439 Abnormal result of other cardiovascular function study: Secondary | ICD-10-CM | POA: Diagnosis present

## 2019-01-07 DIAGNOSIS — I2510 Atherosclerotic heart disease of native coronary artery without angina pectoris: Secondary | ICD-10-CM | POA: Diagnosis not present

## 2019-01-07 DIAGNOSIS — E7800 Pure hypercholesterolemia, unspecified: Secondary | ICD-10-CM

## 2019-01-07 DIAGNOSIS — I739 Peripheral vascular disease, unspecified: Secondary | ICD-10-CM

## 2019-01-07 DIAGNOSIS — R0989 Other specified symptoms and signs involving the circulatory and respiratory systems: Secondary | ICD-10-CM | POA: Diagnosis not present

## 2019-01-07 DIAGNOSIS — I255 Ischemic cardiomyopathy: Secondary | ICD-10-CM

## 2019-01-07 NOTE — Progress Notes (Signed)
Primary Physician/Referring:  Caryl Bis, MD  Patient ID: Sonia Morales, female    DOB: 05-05-1955, 63 y.o.   MRN: [id number]  Chief Complaint  Patient presents with  . Follow-up    HPI: Sonia Morales  is a 63 y.o. female  with Patient presents for 2 month follow-up visit for CAD and ischemic cardiomyopathy.    She underwent nuclear stress test due to worsening symptoms of dyspnea and presents for follow-up.  States that she has noticed gradual worsening dyspnea in spite of trying to be active and watching her diet.  Denies chest pain.  She has also noticed fatigue. She has lost 10 pounds in weight about 5 to 6 months ago with phentermine, however due to weight gain she is now back on phentermine a month ago.  She has remained abstinant from smoking cigarettes. Her DM is uncontrolled. She has not had chest pain or palpitations. No syncope.  Another complaint she had was pain and discomfort in her feet bilaterally, activities bring on severe pain and discomfort.  She is wondering whether this is related to arthritis.  Past Medical History:  Diagnosis Date  . Acute anterior wall MI ([place]) 10/04/2015  . Acute MI anterior wall first episode care [facility]) 10/04/2015   Coronary angiogram 10/04/2015: Occluded proximal LAD, superior takeoff of ramus intermediate, large circumflex, moderate to large RCA with mild diffuse disease. S/P thrombectomy, overlapping stents to the proximal LAD with 3.5 x 18, 3.5 x 12 mm Xience DES, 100% to 0%.   Marland Kitchen CAD (coronary artery disease), native coronary artery 10/11/2015   Coronary angiogram 10/04/2015: Left main mild calcification. LAD tortuous origin, occluded in the proximal segment. Diagonal 2 is moderate-sized, has proximal 50% stenosis. S/P thrombectomy & DES overlapping 3.5 x 18 and 3.5 x 12 mm Xience Alpine stents,   Ramus intermediate has a tortuous origin. Circumflex mild disease. Right coronary artery mild diffuse disease  . DM (diabetes mellitus), type 2,  uncontrolled with complications ([place]) 6/71/2458  . H/O tobacco use, presenting hazards to health 06/12/2018   Quit in 2017 after MI  . Hypercholesteremia 06/12/2018    Past Surgical History:  Procedure Laterality Date  . CARDIAC CATHETERIZATION N/A 10/04/2015   Procedure: Left Heart Cath and Coronary Angiography;  Surgeon: Adrian Prows, MD;  Location: [place] [facility];  Service: Cardiovascular;  Laterality: N/A;  . CARDIAC CATHETERIZATION  10/04/2015   Procedure: Coronary Stent Intervention;  Surgeon: Adrian Prows, MD;  Location: [place] [facility];  Service: Cardiovascular;;  . PERIPHERAL VASCULAR CATHETERIZATION  10/04/2015   Procedure: Thrombectomy;  Surgeon: Adrian Prows, MD;  Location: [place] [facility];  Service: Cardiovascular;;    Social History   Socioeconomic History  . Marital status: Married    Spouse name: Not on file  . Number of children: 2  . Years of education: Not on file  . Highest education level: Not on file  Occupational History  . Not on file  Social Needs  . Financial resource strain: Not on file  . Food insecurity    Worry: Not on file    Inability: Not on file  . Transportation needs    Medical: Not on file    Non-medical: Not on file  Tobacco Use  . Smoking status: Former Smoker    Packs/day: 1.00    Years: 30.00    Pack years: 30.00    Types: Cigarettes    Quit date: 10/04/2018    Years since quitting: 0.2  .  Smokeless tobacco: Never Used  Substance and Sexual Activity  . Alcohol use: Never    Frequency: Never  . Drug use: Never  . Sexual activity: Not on file  Lifestyle  . Physical activity    Days per week: Not on file    Minutes per session: Not on file  . Stress: Not on file  Relationships  . Social Herbalist on phone: Not on file    Gets together: Not on file    Attends religious service: Not on file    Active member of club or organization: Not on file    Attends meetings of clubs or organizations: Not on file     Relationship status: Not on file  . Intimate partner violence    Fear of current or ex partner: Not on file    Emotionally abused: Not on file    Physically abused: Not on file    Forced sexual activity: Not on file  Other Topics Concern  . Not on file  Social History Narrative  . Not on file   Review of Systems  Constitution: Positive for malaise/fatigue. Negative for chills, decreased appetite, weight gain and weight loss.  Cardiovascular: Positive for dyspnea on exertion. Negative for chest pain, claudication, leg swelling and syncope.  Endocrine: Negative for cold intolerance.  Hematologic/Lymphatic: Does not bruise/bleed easily.  Musculoskeletal: Positive for back pain, joint pain and neck pain. Negative for joint swelling.  Gastrointestinal: Positive for constipation. Negative for abdominal pain, anorexia, change in bowel habit, hematochezia and melena.  Neurological: Negative for headaches and light-headedness.  Psychiatric/Behavioral: Positive for depression. Negative for substance abuse.  All other systems reviewed and are negative.  Objective  Blood pressure 129/70, pulse (!) 102, height 5' (1.524 m), weight 181 lb (82.1 kg), SpO2 91 %. Body mass index is 35.35 kg/m.   Physical Exam  Constitutional: She appears well-developed. No distress.  Mildly obese  HENT:  Head: Atraumatic.  Eyes: Conjunctivae are normal.  Neck: Neck supple. No JVD present. No thyromegaly present.  Cardiovascular: Regular rhythm, normal heart sounds and intact distal pulses. Tachycardia present. Exam reveals no gallop.  No murmur heard. Pulses:      Carotid pulses are on the right side with bruit and on the left side with bruit.      Femoral pulses are 1+ on the right side and 1+ on the left side with bruit.      Popliteal pulses are 1+ on the right side and 1+ on the left side.       Dorsalis pedis pulses are 1+ on the right side and 1+ on the left side.       Posterior tibial pulses are 0 on  the right side and 0 on the left side.  No edema. Neck scar from cervical fusion left seen.  Pulmonary/Chest: Effort normal and breath sounds normal.  Abdominal: Soft. Bowel sounds are normal.  Musculoskeletal: Normal range of motion.  Neurological: She is alert.  Skin: Skin is warm and dry.  Psychiatric: She has a normal mood and affect.    Radiology: No results found.  Laboratory examination:   10/15/2017: Hemoglobin A1c 11.4%.  Creatinine 0.79, EGFR 82/94, potassium 4.8, CMP normal.  Cholesterol 132, triglycerides 153, HDL 44, LDL 57.  10/04/2015: Total cholesterol 128, triglycerides 66, HDL 42, LDL 73, TSH 0.613, WBC 17, CBC otherwise normal, HbA1c 10.6%, glucose 259, creatinine 0.56, potassium 3.8  CMP Latest Ref Rng & Units 10/05/2015 10/04/2015  Glucose  65 - 99 mg/dL 259(H) -  BUN 6 - 20 mg/dL 10 -  Creatinine 0.44 - 1.00 mg/dL 0.56 0.54  Sodium 135 - 145 mmol/L 137 -  Potassium 3.5 - 5.1 mmol/L 3.8 -  Chloride 101 - 111 mmol/L 103 -  CO2 22 - 32 mmol/L 25 -  Calcium 8.9 - 10.3 mg/dL 9.2 -   CBC Latest Ref Rng & Units 10/05/2015 10/04/2015  WBC 4.0 - 10.5 K/uL 16.2(H) 17.0(H)  Hemoglobin 12.0 - 15.0 g/dL 12.0 12.1  Hematocrit 36.0 - 46.0 % 36.8 36.7  Platelets 150 - 400 K/uL 186 197   Lipid Panel     Component Value Date/Time   CHOL 128 10/04/2015 1814   TRIG 66 10/04/2015 1814   HDL 42 10/04/2015 1814   CHOLHDL 3.0 10/04/2015 1814   VLDL 13 10/04/2015 1814   LDLCALC 73 10/04/2015 1814   HEMOGLOBIN A1C Lab Results  Component Value Date   HGBA1C 10.6 (H) 10/04/2015   MPG 258 10/04/2015   TSH No results for input(s): TSH in the last 8760 hours.  PRN Meds:. Medications Discontinued During This Encounter  Medication Reason  . methadone (DOLOPHINE) 10 MG tablet Error  . phentermine (ADIPEX-P) 37.5 MG tablet Discontinued by provider   Current Meds  Medication Sig  . aspirin 81 MG tablet Take 81 mg by mouth daily.  Marland Kitchen atorvastatin (LIPITOR) 80 MG tablet Take 1  tablet (80 mg total) by mouth daily at 6 PM.  . carvedilol (COREG) 6.25 MG tablet TAKE ONE TABLET BY MOUTH TWICE DAILY  . Cyanocobalamin (VITAMIN B 12 PO) Take 1 tablet by mouth daily.  Marland Kitchen FLUoxetine (PROZAC) 40 MG capsule Take 40 mg by mouth daily.  . insulin aspart protamine- aspart (NOVOLOG MIX 70/30) (70-30) 100 UNIT/ML injection Inject 50 Units into the skin 2 (two) times daily.  . methadone (DOLOPHINE) 5 MG tablet Take 5 mg by mouth every 12 (twelve) hours.  . sacubitril-valsartan (ENTRESTO) 97-103 MG Take 1 tablet by mouth 2 (two) times daily.  . traZODone (DESYREL) 50 MG tablet Take 50 mg by mouth at bedtime.   . [DISCONTINUED] phentermine (ADIPEX-P) 37.5 MG tablet Take 37.5 mg by mouth daily.    Cardiac Studies:   Coronary angiogram 10/04/2015:  Left main mild calcification. LAD tortuous origin, occluded in the proximal segment. Diagonal 2 is moderate-sized, has proximal 50% stenosis. S/P thrombectomy & DES overlapping 3.5 x 18 and 3.5 x 12 mm Xience Alpine stents, 100% reduced to 0%. Ramus intermediate has a tortuous origin. Circumflex mild disease. Right coronary artery mild diffuse disease. Markedly elevated LVEDP, 31 mmHg.  Carotid artery duplex 10/26/2015: Minimal mixed plaque both carotid arteries without significant stenosis. Antegrade bilateral vertebral flow.  Echocardiogram 06/12/2017: Left ventricle cavity is normal in size. Inferoseptal, apical akinesis. Doppler evidence of grade II (pseudonormal) diastolic dysfunction, elevated LAP. Calculated EF 52%. Left atrial cavity is mildly dilated. Mild (Grade I) mitral regurgitation. Inadequate tricuspid regurgitation jet to estimate pulmonary artery pressure Estimated RA pressure 8 mmHg. Pericardial fat pad seen. Compared to previous study dated 01/11/2016, LVEF has improved from 35%.  Lexiscan Myoview Stress Test 12/29/2018: Lexiscan stress test was performed. Stress EKG is non-diagnostic, as this is pharmacological stress  test. Perfusion images reveal a very large size anterior, anteroseptal, apical and apical inferior transmural scar with moderate amount of ischemia especially at the basal septal region from base to mid ventricle.  Dynamic gated images reveal anterior, anteroseptal and apical akinesis. Stress LV EF is severely dysfunctional  29%.  High risk study. No prior studies for comparision.  Assessment     ICD-10-CM   1. Coronary artery disease involving native coronary artery of native heart without angina pectoris  I25.10 EKG 12-Lead    CMP14+EGFR    CBC  2. Dyspnea on exertion  R06.09   3. Essential hypertension  I10 TSH  4. Claudication in peripheral vascular disease (HCC)  I73.9 PCV LOWER ARTERIAL (BILATERAL)  5. Left carotid bruit  R09.89 PCV CAROTID DUPLEX (BILATERAL)  6. Hypercholesteremia  E78.00 Lipid Panel With LDL/HDL Ratio    LDL cholesterol, direct    TSH   Discontinued phentermine Abnormal stress test, tachycardia  EKG 01/07/2019: Normal sinus rhythm at rate of 92 bpm, left atrial enlargement, left axis deviation, left anterior fascicular block.  Poor R-wave progression, cannot exclude anteroseptal infarct old.  Pulmonary disease pattern.  PVCs (2). Compared to EKG 05/30/2017  anterolateral T wave inversion not present.  Recommendations:   Patient with known coronary artery disease and angioplasty and stenting to the LAD in 2017, tobacco use disorder which he has quit smoking since myocardial infarction, hypertension, hyperlipidemia, uncontrolled diabetes mellitus, presenting with worsening symptoms of dyspnea.  She also has symptoms suggestive of claudication involving her feet and abnormal vascular exam.  I reviewed the results of the recently performed stress test on 12/29/2018, patient has high risk nuclear stress test revealing anterior ischemia.  I have recommended proceeding with cardiac catheterization. There is no clinical e/o CHF and COPD is stable. Schedule for cardiac  catheterization, and possible angioplasty. We discussed regarding risks, benefits, alternatives to this including stress testing, CTA and continued medical therapy. Patient wants to proceed. Understands <1-2% risk of death, stroke, MI, urgent CABG, bleeding, infection, renal failure but not limited to these.   She is on appropriate medical therapy with beta-blocker and also Entresto for cardiomyopathy.  Also on high-dose atorvastatin.  With regard to symptoms suggestive of claudication, lower extremity arterial duplex will be obtained and for a new left carotid bruit, I will repeat carotid artery duplex, last duplex was in 2017 which revealed mild disease.  I will see her back after the cardiac catheterization.  Adrian Prows, MD, [facility] 01/07/2019, 6:04 PM [facility] [place] Pager: [phone] Office: [phone] If no answer Cell [phone]

## 2019-01-13 DIAGNOSIS — I2510 Atherosclerotic heart disease of native coronary artery without angina pectoris: Secondary | ICD-10-CM | POA: Diagnosis not present

## 2019-01-13 DIAGNOSIS — E7800 Pure hypercholesterolemia, unspecified: Secondary | ICD-10-CM | POA: Diagnosis not present

## 2019-01-13 DIAGNOSIS — I10 Essential (primary) hypertension: Secondary | ICD-10-CM | POA: Diagnosis not present

## 2019-01-14 LAB — CBC
Hematocrit: 33.6 % — ABNORMAL LOW (ref 34.0–46.6)
Hemoglobin: 10.8 g/dL — ABNORMAL LOW (ref 11.1–15.9)
MCH: 28.3 pg (ref 26.6–33.0)
MCHC: 32.1 g/dL (ref 31.5–35.7)
MCV: 88 fL (ref 79–97)
Platelets: 222 10*3/uL (ref 150–450)
RBC: 3.82 x10E6/uL (ref 3.77–5.28)
RDW: 12.4 % (ref 11.7–15.4)
WBC: 7 10*3/uL (ref 3.4–10.8)

## 2019-01-14 LAB — CMP14+EGFR
ALT: 12 IU/L (ref 0–32)
AST: 14 IU/L (ref 0–40)
Albumin/Globulin Ratio: 1.6 (ref 1.2–2.2)
Albumin: 4.1 g/dL (ref 3.8–4.8)
Alkaline Phosphatase: 107 IU/L (ref 39–117)
BUN/Creatinine Ratio: 23 (ref 12–28)
BUN: 26 mg/dL (ref 8–27)
Bilirubin Total: <0.2 mg/dL (ref 0.0–1.2)
CO2: 22 mmol/L (ref 20–29)
Calcium: 9.2 mg/dL (ref 8.7–10.3)
Chloride: 100 mmol/L (ref 96–106)
Creatinine, Ser: 1.12 mg/dL — ABNORMAL HIGH (ref 0.57–1.00)
GFR calc Af Amer: 61 mL/min/{1.73_m2} (ref >59)
GFR calc non Af Amer: 53 mL/min/{1.73_m2} — ABNORMAL LOW (ref >59)
Globulin, Total: 2.6 g/dL (ref 1.5–4.5)
Glucose: 295 mg/dL — ABNORMAL HIGH (ref 65–99)
Potassium: 4.8 mmol/L (ref 3.5–5.2)
Sodium: 137 mmol/L (ref 134–144)
Total Protein: 6.7 g/dL (ref 6.0–8.5)

## 2019-01-14 LAB — LDL CHOLESTEROL, DIRECT: LDL Direct: 57 mg/dL (ref 0–99)

## 2019-01-14 LAB — LIPID PANEL WITH LDL/HDL RATIO
Cholesterol, Total: 121 mg/dL (ref 100–199)
HDL: 46 mg/dL (ref >39)
LDL Chol Calc (NIH): 59 mg/dL (ref 0–99)
LDL/HDL Ratio: 1.3 ratio (ref 0.0–3.2)
Triglycerides: 82 mg/dL (ref 0–149)
VLDL Cholesterol Cal: 16 mg/dL (ref 5–40)

## 2019-01-14 LAB — TSH: TSH: 2.1 u[IU]/mL (ref 0.450–4.500)

## 2019-01-16 DIAGNOSIS — M545 Low back pain: Secondary | ICD-10-CM | POA: Diagnosis not present

## 2019-01-16 DIAGNOSIS — R300 Dysuria: Secondary | ICD-10-CM | POA: Diagnosis not present

## 2019-01-16 DIAGNOSIS — Z6832 Body mass index (BMI) 32.0-32.9, adult: Secondary | ICD-10-CM | POA: Diagnosis not present

## 2019-01-19 ENCOUNTER — Ambulatory Visit: Payer: Medicare Other

## 2019-01-19 ENCOUNTER — Other Ambulatory Visit: Payer: Self-pay

## 2019-01-19 DIAGNOSIS — R0989 Other specified symptoms and signs involving the circulatory and respiratory systems: Secondary | ICD-10-CM

## 2019-01-19 DIAGNOSIS — I739 Peripheral vascular disease, unspecified: Secondary | ICD-10-CM

## 2019-01-19 NOTE — Progress Notes (Signed)
Slight worsening in kidney function compared to prior BMP in chart, but only minimally elevated. Mildly decreased H and H, but normal indices. Lipids well controlled. TSH stable.

## 2019-01-23 ENCOUNTER — Other Ambulatory Visit (HOSPITAL_COMMUNITY)
Admission: RE | Admit: 2019-01-23 | Discharge: 2019-01-23 | Disposition: A | Discharge status: Home / Self Care | Payer: Medicare Other | Source: Ambulatory Visit | Attending: Cardiology | Admitting: Cardiology

## 2019-01-23 ENCOUNTER — Other Ambulatory Visit (HOSPITAL_COMMUNITY): Payer: Self-pay

## 2019-01-23 DIAGNOSIS — Z20828 Contact with and (suspected) exposure to other viral communicable diseases: Secondary | ICD-10-CM | POA: Diagnosis not present

## 2019-01-23 DIAGNOSIS — Z01812 Encounter for preprocedural laboratory examination: Secondary | ICD-10-CM | POA: Insufficient documentation

## 2019-01-25 ENCOUNTER — Other Ambulatory Visit: Payer: Self-pay | Admitting: Cardiology

## 2019-01-25 DIAGNOSIS — I6523 Occlusion and stenosis of bilateral carotid arteries: Secondary | ICD-10-CM

## 2019-01-26 LAB — NOVEL CORONAVIRUS, NAA (HOSP ORDER, SEND-OUT TO REF LAB; TAT 18-24 HRS): SARS-CoV-2, NAA: NOT DETECTED

## 2019-01-27 DIAGNOSIS — N132 Hydronephrosis with renal and ureteral calculous obstruction: Secondary | ICD-10-CM | POA: Diagnosis not present

## 2019-01-27 DIAGNOSIS — M545 Low back pain: Secondary | ICD-10-CM | POA: Diagnosis not present

## 2019-01-27 DIAGNOSIS — K529 Noninfective gastroenteritis and colitis, unspecified: Secondary | ICD-10-CM | POA: Diagnosis not present

## 2019-01-30 ENCOUNTER — Telehealth: Payer: Self-pay

## 2019-01-30 DIAGNOSIS — R1011 Right upper quadrant pain: Secondary | ICD-10-CM | POA: Diagnosis not present

## 2019-02-01 DIAGNOSIS — Z7984 Long term (current) use of oral hypoglycemic drugs: Secondary | ICD-10-CM | POA: Diagnosis not present

## 2019-02-01 DIAGNOSIS — Z20828 Contact with and (suspected) exposure to other viral communicable diseases: Secondary | ICD-10-CM | POA: Diagnosis not present

## 2019-02-01 DIAGNOSIS — R109 Unspecified abdominal pain: Secondary | ICD-10-CM | POA: Diagnosis not present

## 2019-02-01 DIAGNOSIS — N200 Calculus of kidney: Secondary | ICD-10-CM | POA: Diagnosis not present

## 2019-02-01 DIAGNOSIS — E860 Dehydration: Secondary | ICD-10-CM | POA: Diagnosis not present

## 2019-02-01 DIAGNOSIS — R112 Nausea with vomiting, unspecified: Secondary | ICD-10-CM | POA: Diagnosis not present

## 2019-02-01 DIAGNOSIS — Z79899 Other long term (current) drug therapy: Secondary | ICD-10-CM | POA: Diagnosis not present

## 2019-02-01 DIAGNOSIS — Z885 Allergy status to narcotic agent status: Secondary | ICD-10-CM | POA: Diagnosis not present

## 2019-02-01 DIAGNOSIS — E871 Hypo-osmolality and hyponatremia: Secondary | ICD-10-CM | POA: Diagnosis not present

## 2019-02-01 DIAGNOSIS — E1165 Type 2 diabetes mellitus with hyperglycemia: Secondary | ICD-10-CM | POA: Diagnosis not present

## 2019-02-01 DIAGNOSIS — Z7982 Long term (current) use of aspirin: Secondary | ICD-10-CM | POA: Diagnosis not present

## 2019-02-01 DIAGNOSIS — R5381 Other malaise: Secondary | ICD-10-CM | POA: Diagnosis not present

## 2019-02-01 DIAGNOSIS — R531 Weakness: Secondary | ICD-10-CM | POA: Diagnosis not present

## 2019-02-02 DIAGNOSIS — U071 COVID-19: Secondary | ICD-10-CM | POA: Diagnosis not present

## 2019-02-02 DIAGNOSIS — R0602 Shortness of breath: Secondary | ICD-10-CM | POA: Diagnosis not present

## 2019-02-02 DIAGNOSIS — R05 Cough: Secondary | ICD-10-CM | POA: Diagnosis not present

## 2019-02-02 NOTE — Telephone Encounter (Signed)
Cancel her cath. She is ill. Please set up OV  in 1 month

## 2019-02-03 ENCOUNTER — Ambulatory Visit (HOSPITAL_COMMUNITY): Admission: RE | Admit: 2019-02-03 | Payer: Medicare Other | Source: Home / Self Care | Admitting: Cardiology

## 2019-02-03 ENCOUNTER — Encounter (HOSPITAL_COMMUNITY): Admission: RE | Payer: Self-pay | Source: Home / Self Care

## 2019-02-03 SURGERY — LEFT HEART CATH AND CORONARY ANGIOGRAPHY
Anesthesia: LOCAL

## 2019-02-05 DIAGNOSIS — Z20828 Contact with and (suspected) exposure to other viral communicable diseases: Secondary | ICD-10-CM | POA: Diagnosis not present

## 2019-02-06 ENCOUNTER — Ambulatory Visit: Payer: Medicare Other | Admitting: Cardiology

## 2019-02-07 DIAGNOSIS — Z20828 Contact with and (suspected) exposure to other viral communicable diseases: Secondary | ICD-10-CM | POA: Diagnosis not present

## 2019-02-11 DIAGNOSIS — E1142 Type 2 diabetes mellitus with diabetic polyneuropathy: Secondary | ICD-10-CM | POA: Diagnosis not present

## 2019-02-11 DIAGNOSIS — E782 Mixed hyperlipidemia: Secondary | ICD-10-CM | POA: Diagnosis not present

## 2019-02-11 DIAGNOSIS — I10 Essential (primary) hypertension: Secondary | ICD-10-CM | POA: Diagnosis not present

## 2019-02-11 DIAGNOSIS — M79642 Pain in left hand: Secondary | ICD-10-CM | POA: Diagnosis not present

## 2019-02-11 DIAGNOSIS — M79671 Pain in right foot: Secondary | ICD-10-CM | POA: Diagnosis not present

## 2019-02-11 DIAGNOSIS — E1165 Type 2 diabetes mellitus with hyperglycemia: Secondary | ICD-10-CM | POA: Diagnosis not present

## 2019-02-11 DIAGNOSIS — I2510 Atherosclerotic heart disease of native coronary artery without angina pectoris: Secondary | ICD-10-CM | POA: Diagnosis not present

## 2019-02-11 DIAGNOSIS — F331 Major depressive disorder, recurrent, moderate: Secondary | ICD-10-CM | POA: Diagnosis not present

## 2019-02-27 DIAGNOSIS — Z23 Encounter for immunization: Secondary | ICD-10-CM | POA: Diagnosis not present

## 2019-03-04 ENCOUNTER — Encounter: Payer: Self-pay | Admitting: Cardiology

## 2019-03-04 ENCOUNTER — Ambulatory Visit (INDEPENDENT_AMBULATORY_CARE_PROVIDER_SITE_OTHER): Payer: Medicare Other | Admitting: Cardiology

## 2019-03-04 ENCOUNTER — Other Ambulatory Visit: Payer: Self-pay

## 2019-03-04 ENCOUNTER — Other Ambulatory Visit: Payer: Self-pay | Admitting: Cardiology

## 2019-03-04 VITALS — BP 99/57 | HR 80 | Temp 97.7°F | Ht 63.0 in | Wt 181.1 lb

## 2019-03-04 DIAGNOSIS — M19041 Primary osteoarthritis, right hand: Secondary | ICD-10-CM | POA: Diagnosis not present

## 2019-03-04 DIAGNOSIS — I10 Essential (primary) hypertension: Secondary | ICD-10-CM | POA: Diagnosis not present

## 2019-03-04 DIAGNOSIS — I6523 Occlusion and stenosis of bilateral carotid arteries: Secondary | ICD-10-CM | POA: Diagnosis not present

## 2019-03-04 DIAGNOSIS — I2510 Atherosclerotic heart disease of native coronary artery without angina pectoris: Secondary | ICD-10-CM

## 2019-03-04 DIAGNOSIS — M19042 Primary osteoarthritis, left hand: Secondary | ICD-10-CM | POA: Diagnosis not present

## 2019-03-04 DIAGNOSIS — R0600 Dyspnea, unspecified: Secondary | ICD-10-CM | POA: Diagnosis not present

## 2019-03-04 DIAGNOSIS — I255 Ischemic cardiomyopathy: Secondary | ICD-10-CM

## 2019-03-04 DIAGNOSIS — R0609 Other forms of dyspnea: Secondary | ICD-10-CM

## 2019-03-04 MED ORDER — MELOXICAM 7.5 MG PO TBDP: 1.0000 | ORAL_TABLET | Freq: Every day | ORAL | 1 refills | Status: DC | PRN

## 2019-03-04 NOTE — Progress Notes (Signed)
Primary Physician/Referring:  Richardean Chimeraaniel, Terry G, MD  Patient ID: Sonia Morales, female    DOB: 02/11/1956, 63 y.o.   MRN: [id number]  Chief Complaint  Patient presents with  . Coronary Artery Disease  . Results    carotid dup  . Follow-up   HPI:    Sonia Morales  is a 63 y.o. Caucasian female  with Patient presents for 2 month follow-up visit for CAD and ischemic cardiomyopathy. Due to worsening dyspnea, underwent nuclear stress test in September 2020 which revealed large anterior and apical and inferior transmural scar with moderate amount of peri-infarct ischemia with EF 29% felt to be high risk.    She was scheduled for cardiac catheterization, but the procedure was canceled as she developed colitis, then she had pneumonia and then COVID 19 in Oct 2020.  She now presents here for follow-up. She has remained abstinant from smoking cigarettes. Her DM is uncontrolled. She has not had chest pain or palpitations. No syncope.   Since occurred from her acute illness last month, she started to feel better, dyspnea is improved and stable, no PND or orthopnea.  No leg edema.  She is concerned about uncontrolled diabetes and she is trying her best that she can.  Past Medical History:  Diagnosis Date  . Acute anterior wall MI (HCC) 10/04/2015  . Acute MI anterior wall first episode care [facility](HCC) 10/04/2015   Coronary angiogram 10/04/2015: Occluded proximal LAD, superior takeoff of ramus intermediate, large circumflex, moderate to large RCA with mild diffuse disease. S/P thrombectomy, overlapping stents to the proximal LAD with 3.5 x 18, 3.5 x 12 mm Xience DES, 100% to 0%.   Marland Kitchen. CAD (coronary artery disease), native coronary artery 10/11/2015   Coronary angiogram 10/04/2015: Left main mild calcification. LAD tortuous origin, occluded in the proximal segment. Diagonal 2 is moderate-sized, has proximal 50% stenosis. S/P thrombectomy & DES overlapping 3.5 x 18 and 3.5 x 12 mm Xience Alpine stents,   Ramus  intermediate has a tortuous origin. Circumflex mild disease. Right coronary artery mild diffuse disease  . Colitis   . DM (diabetes mellitus), type 2, uncontrolled with complications (HCC) 06/12/2018  . H/O tobacco use, presenting hazards to health 06/12/2018   Quit in 2017 after MI  . Hypercholesteremia 06/12/2018   Past Surgical History:  Procedure Laterality Date  . CARDIAC CATHETERIZATION N/A 10/04/2015   Procedure: Left Heart Cath and Coronary Angiography;  Surgeon: Yates DecampJay Josue Kass, MD;  [facility];  Service: Cardiovascular;  Laterality: N/A;  . CARDIAC CATHETERIZATION  10/04/2015   Procedure: Coronary Stent Intervention;  Surgeon: Yates DecampJay Kiarra Kidd, MD;  [facility];  Service: Cardiovascular;;  . PERIPHERAL VASCULAR CATHETERIZATION  10/04/2015   Procedure: Thrombectomy;  Surgeon: Yates DecampJay Brix Brearley, MD;  [facility];  Service: Cardiovascular;;   Social History   Socioeconomic History  . Marital status: Married    Spouse name: Not on file  . Number of children: 2  . Years of education: Not on file  . Highest education level: Not on file  Occupational History  . Not on file  Social Needs  . Financial resource strain: Not on file  . Food insecurity    Worry: Not on file    Inability: Not on file  . Transportation needs    Medical: Not on file    Non-medical: Not on file  Tobacco Use  . Smoking status: Former Smoker    Packs/day: 1.00    Years:  30.00    Pack years: 30.00    Types: Cigarettes    Quit date: 10/04/2018    Years since quitting: 0.4  . Smokeless tobacco: Never Used  Substance and Sexual Activity  . Alcohol use: Never    Frequency: Never  . Drug use: Never  . Sexual activity: Not on file  Lifestyle  . Physical activity    Days per week: Not on file    Minutes per session: Not on file  . Stress: Not on file  Relationships  . Social Musicianconnections    Talks on phone: Not on file    Gets together: Not on file    Attends religious  service: Not on file    Active member of club or organization: Not on file    Attends meetings of clubs or organizations: Not on file    Relationship status: Not on file  . Intimate partner violence    Fear of current or ex partner: Not on file    Emotionally abused: Not on file    Physically abused: Not on file    Forced sexual activity: Not on file  Other Topics Concern  . Not on file  Social History Narrative  . Not on file   ROS  Review of Systems  Constitution: Negative for chills, decreased appetite, malaise/fatigue, weight gain and weight loss.  Cardiovascular: Positive for dyspnea on exertion. Negative for chest pain, claudication, leg swelling and syncope.  Endocrine: Negative for cold intolerance.  Hematologic/Lymphatic: Does not bruise/bleed easily.  Musculoskeletal: Positive for back pain, joint pain and neck pain. Negative for joint swelling.  Gastrointestinal: Positive for constipation. Negative for abdominal pain, anorexia, change in bowel habit, hematochezia and melena.  Neurological: Negative for headaches and light-headedness.  Psychiatric/Behavioral: Positive for depression. Negative for substance abuse.  All other systems reviewed and are negative.  Objective   Vitals with BMI 03/04/2019 01/07/2019 11/03/2018  Height 5\' 3"  5\' 0"  5\' 5"   Weight 181 lbs 2 oz 181 lbs 175 lbs  BMI 32.09 35.35 29.12  Systolic - 129 (No Data)  Diastolic - 70 (No Data)  Pulse - 102 -    Physical Exam  Constitutional: She appears well-developed. No distress.  Mildly obese  HENT:  Head: Atraumatic.  Eyes: Conjunctivae are normal.  Neck: Neck supple. No JVD present. No thyromegaly present.  Cardiovascular: Regular rhythm, normal heart sounds and intact distal pulses. Tachycardia present. Exam reveals no gallop.  No murmur heard. Pulses:      Carotid pulses are on the right side with bruit and on the left side with bruit.      Femoral pulses are 1+ on the right side and 1+ on the  left side with bruit.      Popliteal pulses are 2+ on the right side and 2+ on the left side.       Dorsalis pedis pulses are 2+ on the right side and 1+ on the left side.       Posterior tibial pulses are 0 on the right side and 0 on the left side.  No edema. Neck scar from cervical fusion left seen.  Pulmonary/Chest: Effort normal and breath sounds normal.  Abdominal: Soft. Bowel sounds are normal.  Musculoskeletal: Normal range of motion.  Neurological: She is alert.  Skin: Skin is warm and dry.  Psychiatric: She has a normal mood and affect.   Laboratory examination:    Recent Labs    01/13/19 1349  NA 137  K 4.8  CL 100  CO2 22  GLUCOSE 295*  BUN 26  CREATININE 1.12*  CALCIUM 9.2  GFRNONAA 53*  GFRAA 61   CrCl cannot be calculated (Patient's most recent lab result is older than the maximum 21 days allowed.).  CMP Latest Ref Rng & Units 01/13/2019 10/05/2015 10/04/2015  Glucose 65 - 99 mg/dL 633(H) 545(G) -  BUN 8 - 27 mg/dL 26 10 -  Creatinine 2.56 - 1.00 mg/dL 3.89(H) 7.34 2.87  Sodium 134 - 144 mmol/L 137 137 -  Potassium 3.5 - 5.2 mmol/L 4.8 3.8 -  Chloride 96 - 106 mmol/L 100 103 -  CO2 20 - 29 mmol/L 22 25 -  Calcium 8.7 - 10.3 mg/dL 9.2 9.2 -  Total Protein 6.0 - 8.5 g/dL 6.7 - -  Total Bilirubin 0.0 - 1.2 mg/dL <6.8 - -  Alkaline Phos 39 - 117 IU/L 107 - -  AST 0 - 40 IU/L 14 - -  ALT 0 - 32 IU/L 12 - -   CBC Latest Ref Rng & Units 01/13/2019 10/05/2015 10/04/2015  WBC 3.4 - 10.8 x10E3/uL 7.0 16.2(H) 17.0(H)  Hemoglobin 11.1 - 15.9 g/dL 10.8(L) 12.0 12.1  Hematocrit 34.0 - 46.6 % 33.6(L) 36.8 36.7  Platelets 150 - 450 x10E3/uL 222 186 197   Lipid Panel     Component Value Date/Time   CHOL 121 01/13/2019 1349   TRIG 82 01/13/2019 1349   HDL 46 01/13/2019 1349   CHOLHDL 3.0 10/04/2015 1814   VLDL 13 10/04/2015 1814   LDLCALC 59 01/13/2019 1349   LDLDIRECT 57 01/13/2019 1349   HEMOGLOBIN A1C Lab Results  Component Value Date   HGBA1C 10.6 (H)  10/04/2015   MPG 258 10/04/2015   TSH Recent Labs    01/13/19 1349  TSH 2.100   Medications and allergies  No Known Allergies   Current Outpatient Medications  Medication Instructions  . aspirin 81 mg, Oral, Daily  . atorvastatin (LIPITOR) 80 mg, Oral, Daily-1800  . carvedilol (COREG) 6.25 MG tablet TAKE ONE TABLET BY MOUTH TWICE DAILY  . Cyanocobalamin (VITAMIN B 12 PO) 1,000 Units, Oral, 2 times daily  . FLUoxetine (PROZAC) 40 mg, Oral, Daily  . insulin aspart protamine- aspart (NOVOLOG MIX 70/30) (70-30) 100 UNIT/ML injection 43 Units, Subcutaneous, 2 times daily  . Melatonin 5 mg, Oral, Daily  . methadone (DOLOPHINE) 5 mg, Oral, Daily PRN  . Oxycodone HCl 10 mg, Oral, 3 times daily PRN  . sacubitril-valsartan (ENTRESTO) 97-103 MG 1 tablet, Oral, 2 times daily  . traZODone (DESYREL) 50 mg, Oral, Daily at bedtime    Radiology:  No results found. Cardiac Studies:   Coronary angiogram 10/04/2015:  Left main mild calcification. LAD tortuous origin, occluded in the proximal segment. Diagonal 2 is moderate-sized, has proximal 50% stenosis. S/P thrombectomy & DES overlapping 3.5 x 18 and 3.5 x 12 mm Xience Alpine stents, 100% reduced to 0%. Ramus intermediate has a tortuous origin. Circumflex mild disease. Right coronary artery mild diffuse disease. Markedly elevated LVEDP, 31 mmHg.  Echocardiogram 06/12/2017: Left ventricle cavity is normal in size. Inferoseptal, apical akinesis. Doppler evidence of grade II (pseudonormal) diastolic dysfunction, elevated LAP. Calculated EF 52%. Left atrial cavity is mildly dilated. Mild (Grade I) mitral regurgitation. Inadequate tricuspid regurgitation jet to estimate pulmonary artery pressure Estimated RA pressure 8 mmHg. Pericardial fat pad seen. Compared to previous study dated 01/11/2016, LVEF has improved from 35%.  Lexiscan Myoview Stress Test 12/29/2018: Lexiscan stress test was performed. Stress EKG is non-diagnostic, as  this is  pharmacological stress test. Perfusion images reveal a very large size anterior, anteroseptal, apical and apical inferior transmural scar with moderate amount of ischemia especially at the basal septal region from base to mid ventricle.  Dynamic gated images reveal anterior, anteroseptal and apical akinesis. Stress LV EF is severely dysfunctional 29%.  High risk study. No prior studies for comparision.  Carotid artery duplex  01/19/2019: Stenosis in the right internal carotid artery (16-49%). Stenosis in the right external carotid artery (<50%). Stenosis in the left internal carotid artery (16-49%). Antegrade right vertebral artery flow. Antegrade left vertebral artery flow. Compared to the study done on 10/26/2015, bilateral ICA stenosis is new. Follow up in one year is appropriate if clinically indicated.  Lower Extremity Arterial Duplex 01/19/2019: No hemodynamically significant stenoses are identified in the bilateral lower extremity arterial system.  This exam reveals normal perfusion of the right (ABI 1.04) and left (ABI 1.00) lower extremity. Mildly abnormal waveform at the level of the ankle suggests mild small vessel disease.  Assessment     ICD-10-CM   1. Coronary artery disease involving native coronary artery of native heart without angina pectoris  I25.10   2. Dyspnea on exertion  R06.00   3. Essential hypertension  I10   4. Asymptomatic bilateral carotid artery stenosis  I65.23     EKG 01/07/2019: Normal sinus rhythm at rate of 92 bpm, left atrial enlargement, left axis deviation, left anterior fascicular block.  Poor R-wave progression, cannot exclude anteroseptal infarct old.  Pulmonary disease pattern.  PVCs (2). Compared to EKG 05/30/2017  anterolateral T wave inversion not present.  Recommendations:  No orders of the defined types were placed in this encounter.   Patient with known coronary artery disease and angioplasty and stenting to the LAD in 2017, tobacco use  disorder which he has quit smoking since myocardial infarction, asymptomatic bilateral carotid stenosis, hypertension, hyperlipidemia, uncontrolled diabetes mellitus, presenting with worsening symptoms of dyspnea.    Previously in 2017, she has had severe LV systolic dysfunction.  She is now on aggressive medical therapy and EF is improved by echocardiogram performed in February 2019 however the nuclear stress test in September 2020 revealed severely systolic dysfunction.  I reviewed the results of the recently performed stress test on 12/29/2018, patient has high risk nuclear stress test revealing anterior ischemia.  I have recommended proceeding with cardiac catheterization especially to exclude multivessel disease.  There is no clinical e/o CHF and COPD is stable. I feel we should be able to continue medical therapy and improve DM control. BP and Lipids under excellent control.    Weight loss stressed again and gave positive reinforcement. [facility] diet (low glycemic) sheet given to the patient.  Discussed regarding DASH diet and salt restriction.   She is on appropriate medical therapy with beta-blocker and also Entresto for cardiomyopathy.  Also on high-dose atorvastatin.  On her last office visit she had complained of symptoms suggestive of claudication however her lower extremity arterial duplex within normal limits.  Suspect symptoms could be related to peripheral neuropathy. She May benefit from diabetes education.  She thinks she may also be developing diabetic retinopathy. I will repeat echo and unless EF has decreased again, continue medical therapy, otherwise will consider cardiac catheterization.  I'll like to see her back in 3 months for follow-up.  She'll contact me if her dyspnea is worsening or if she develops any leg edema, PND or orthopnea.   Patient has severe arthritis involving both hands, I prescribed  her meloxicam and she is aware of GI bleeding risk and also risk of congestive  heart failure and worsening CAD, she'll only take it on a p.r.n. basis.  Adrian Prows, MD, [facility] 03/04/2019, 3:44 PM [facility] [place] Pager: [phone] Office: [phone] If no answer Cell [phone]

## 2019-03-31 ENCOUNTER — Other Ambulatory Visit: Payer: Medicare Other

## 2019-04-14 ENCOUNTER — Ambulatory Visit (INDEPENDENT_AMBULATORY_CARE_PROVIDER_SITE_OTHER): Payer: Medicare Other

## 2019-04-14 ENCOUNTER — Other Ambulatory Visit: Payer: Self-pay

## 2019-04-14 DIAGNOSIS — I2510 Atherosclerotic heart disease of native coronary artery without angina pectoris: Secondary | ICD-10-CM | POA: Diagnosis not present

## 2019-04-14 DIAGNOSIS — R0609 Other forms of dyspnea: Secondary | ICD-10-CM

## 2019-04-21 NOTE — Progress Notes (Signed)
Patient aware.

## 2019-04-22 ENCOUNTER — Other Ambulatory Visit: Payer: Self-pay | Admitting: Cardiology

## 2019-04-22 DIAGNOSIS — M19041 Primary osteoarthritis, right hand: Secondary | ICD-10-CM

## 2019-04-22 DIAGNOSIS — M19042 Primary osteoarthritis, left hand: Secondary | ICD-10-CM

## 2019-05-01 DIAGNOSIS — K529 Noninfective gastroenteritis and colitis, unspecified: Secondary | ICD-10-CM | POA: Diagnosis not present

## 2019-05-08 ENCOUNTER — Ambulatory Visit: Payer: Medicare Other | Admitting: Cardiology

## 2019-06-04 ENCOUNTER — Ambulatory Visit: Payer: Medicare Other | Admitting: Cardiology

## 2019-06-12 DIAGNOSIS — R1011 Right upper quadrant pain: Secondary | ICD-10-CM | POA: Diagnosis not present

## 2019-06-12 DIAGNOSIS — R933 Abnormal findings on diagnostic imaging of other parts of digestive tract: Secondary | ICD-10-CM | POA: Diagnosis not present

## 2019-06-29 ENCOUNTER — Other Ambulatory Visit: Payer: Self-pay | Admitting: Cardiology

## 2019-07-01 DIAGNOSIS — M2550 Pain in unspecified joint: Secondary | ICD-10-CM | POA: Diagnosis not present

## 2019-07-27 ENCOUNTER — Encounter: Payer: Self-pay | Admitting: Cardiology

## 2019-07-27 ENCOUNTER — Other Ambulatory Visit: Payer: Self-pay

## 2019-07-27 ENCOUNTER — Ambulatory Visit (INDEPENDENT_AMBULATORY_CARE_PROVIDER_SITE_OTHER): Payer: Medicare Other | Admitting: Cardiology

## 2019-07-27 VITALS — BP 112/60 | HR 89 | Temp 97.6°F | Resp 16 | Ht 63.0 in | Wt 181.5 lb

## 2019-07-27 DIAGNOSIS — I10 Essential (primary) hypertension: Secondary | ICD-10-CM

## 2019-07-27 DIAGNOSIS — R0609 Other forms of dyspnea: Secondary | ICD-10-CM

## 2019-07-27 DIAGNOSIS — I2510 Atherosclerotic heart disease of native coronary artery without angina pectoris: Secondary | ICD-10-CM

## 2019-07-27 MED ORDER — NITROGLYCERIN 0.4 MG SL SUBL: 0.4000 mg | SUBLINGUAL_TABLET | SUBLINGUAL | 3 refills | Status: DC | PRN

## 2019-07-27 NOTE — Progress Notes (Signed)
Primary Physician/Referring:  Caryl Bis, MD  Patient ID: Sonia Morales, female    DOB: 1955-04-19, 64 y.o.   MRN: [id number]  No chief complaint on file.  HPI:    Sonia Morales  is a 64 y.o. Caucasian female  with CAD, history of LAD stenting in 2017, tobacco use disorder in the past quit since angioplasty, asymptomatic bilateral carotid artery stenosis, hypertension, hyperlipidemia, uncontrolled diabetes mellitus and ischemic cardiomyopathy. Due to worsening dyspnea, underwent nuclear stress test in September 2020 which revealed large anterior and apical and inferior transmural scar with moderate amount of peri-infarct ischemia with EF 29% felt to be high risk,   cardiac catheterization was postponed due to COVID-19 pandemic and also she had developed community-acquired pneumonia.  Medical therapy was recommended.  She now presents for follow-up.   States that she is presently doing well, she has not had any chest pain, no dyspnea, denies symptoms of claudication although she has tingling and numbness in her feet.  She has been trying to exercise by means of walking on a daily basis.  Past Medical History:  Diagnosis Date  . Acute anterior wall MI ([place]) 10/04/2015  . Acute MI anterior wall first episode care [facility]) 10/04/2015   Coronary angiogram 10/04/2015: Occluded proximal LAD, superior takeoff of ramus intermediate, large circumflex, moderate to large RCA with mild diffuse disease. S/P thrombectomy, overlapping stents to the proximal LAD with 3.5 x 18, 3.5 x 12 mm Xience DES, 100% to 0%.   Mar[facility] Kitchen CAD (coronary artery disease), native coronary artery 10/11/2015   Coronary angiogram 10/04/2015: Left main mild calcification. LAD tortuous origin, occluded in the proximal segment. Diagonal 2 is moderate-sized, has proximal 50% stenosis. S/P thrombectomy & DES overlapping 3.5 x 18 and 3.5 x 12 mm Xience Alpine stents,   Ramus intermediate has a tortuous origin. Circumflex mild disease. Right coronary  artery mild diffuse disease  . Colitis   . DM (diabetes mellitus), type 2, uncontrolled with complications ([place]) 06/29/1759  . H/O tobacco use, presenting hazards to health 06/12/2018   Quit in 2017 after MI  . Hypercholesteremia 06/12/2018   Past Surgical History:  Procedure Laterality Date  . CARDIAC CATHETERIZATION N/A 10/04/2015   Procedure: Left Heart Cath and Coronary Angiography;  Surgeon: Adrian Prows, MD;  Location: [place] [facility];  Service: Cardiovascular;  Laterality: N/A;  . CARDIAC CATHETERIZATION  10/04/2015   Procedure: Coronary Stent Intervention;  Surgeon: Adrian Prows, MD;  Location: [place] [facility];  Service: Cardiovascular;;  . PERIPHERAL VASCULAR CATHETERIZATION  10/04/2015   Procedure: Thrombectomy;  Surgeon: Adrian Prows, MD;  Location: [place] [facility];  Service: Cardiovascular;;   Social History   Tobacco Use  . Smoking status: Former Smoker    Packs/day: 1.00    Years: 30.00    Pack years: 30.00    Types: Cigarettes    Quit date: 10/04/2018    Years since quitting: 0.8  . Smokeless tobacco: Never Used  Substance Use Topics  . Alcohol use: Never   Marital Status: Married   ROS  Review of Systems  Cardiovascular: Positive for dyspnea on exertion. Negative for chest pain, claudication, leg swelling and syncope.  Musculoskeletal: Positive for back pain, joint pain and neck pain.  Gastrointestinal: Negative for constipation and melena.  Neurological: Positive for paresthesias (feet).  Psychiatric/Behavioral: Positive for depression.  All other systems reviewed and are negative.  Objective   Vitals with BMI 07/27/2019 03/04/2019 01/07/2019  Height 5\' 3"  5\' 3"   5\' 0"   Weight 181 lbs 8 oz 181 lbs 2 oz 181 lbs  BMI 32.16 32.09 35.35  Systolic 112 99 129  Diastolic 60 57 70  Pulse 89 80 102    Physical Exam  Constitutional: She appears well-developed. No distress.  Mildly obese  HENT:  Head: Atraumatic.  Cardiovascular: Regular rhythm and normal heart  sounds. Tachycardia present. Exam reveals no gallop.  No murmur heard. Pulses:      Carotid pulses are on the right side with bruit and on the left side with bruit.      Femoral pulses are 1+ on the right side and 1+ on the left side with bruit.      Popliteal pulses are 2+ on the right side and 2+ on the left side.       Dorsalis pedis pulses are 2+ on the right side and 1+ on the left side.       Posterior tibial pulses are 0 on the right side and 0 on the left side.  No edema. Neck scar from cervical fusion left seen. No JVD.  Pulmonary/Chest: Effort normal and breath sounds normal.  Abdominal: Soft. Bowel sounds are normal.   Laboratory examination:    Recent Labs    01/13/19 1349  NA 137  K 4.8  CL 100  CO2 22  GLUCOSE 295*  BUN 26  CREATININE 1.12*  CALCIUM 9.2  GFRNONAA 53*  GFRAA 61   CrCl cannot be calculated (Patient's most recent lab result is older than the maximum 21 days allowed.).  CMP Latest Ref Rng & Units 01/13/2019 10/05/2015 10/04/2015  Glucose 65 - 99 mg/dL 10/06/2015) 654(Y) -  BUN 8 - 27 mg/dL 26 10 -  Creatinine 503(T - 1.00 mg/dL 4.65) 6.81(E 7.51  Sodium 134 - 144 mmol/L 137 137 -  Potassium 3.5 - 5.2 mmol/L 4.8 3.8 -  Chloride 96 - 106 mmol/L 100 103 -  CO2 20 - 29 mmol/L 22 25 -  Calcium 8.7 - 10.3 mg/dL 9.2 9.2 -  Total Protein 6.0 - 8.5 g/dL 6.7 - -  Total Bilirubin 0.0 - 1.2 mg/dL 7.00 - -  Alkaline Phos 39 - 117 IU/L 107 - -  AST 0 - 40 IU/L 14 - -  ALT 0 - 32 IU/L 12 - -   CBC Latest Ref Rng & Units 01/13/2019 10/05/2015 10/04/2015  WBC 3.4 - 10.8 x10E3/uL 7.0 16.2(H) 17.0(H)  Hemoglobin 11.1 - 15.9 g/dL 10.8(L) 12.0 12.1  Hematocrit 34.0 - 46.6 % 33.6(L) 36.8 36.7  Platelets 150 - 450 x10E3/uL 222 186 197   Lipid Panel     Component Value Date/Time   CHOL 121 01/13/2019 1349   TRIG 82 01/13/2019 1349   HDL 46 01/13/2019 1349   CHOLHDL 3.0 10/04/2015 1814   VLDL 13 10/04/2015 1814   LDLCALC 59 01/13/2019 1349   LDLDIRECT 57 01/13/2019  1349   HEMOGLOBIN A1C Lab Results  Component Value Date   HGBA1C 10.6 (H) 10/04/2015   MPG 258 10/04/2015   TSH Recent Labs    01/13/19 1349  TSH 2.100   External labs:  A1C 11.600 % 11/10/2018  Medications and allergies  No Known Allergies   Current Outpatient Medications  Medication Instructions  . aspirin 81 mg, Oral, Daily  . atorvastatin (LIPITOR) 80 mg, Oral, Daily-1800  . carvedilol (COREG) 6.25 MG tablet TAKE ONE TABLET BY MOUTH TWICE DAILY  . Cyanocobalamin (VITAMIN B 12 PO) 1,000 Units, Oral, 2 times daily  .  FLUoxetine (PROZAC) 40 mg, Oral, Daily  . insulin aspart protamine- aspart (NOVOLOG MIX 70/30) (70-30) 100 UNIT/ML injection 45 Units, Subcutaneous, 2 times daily  . meloxicam (MOBIC) 7.5 MG tablet TAKE ONE TABLET BY MOUTH DAILY  . nitroGLYCERIN (NITROSTAT) 0.4 mg, Sublingual, Every 5 min PRN  . Oxycodone HCl 10 mg, Oral, 3 times daily PRN  . sacubitril-valsartan (ENTRESTO) 97-103 MG 1 tablet, Oral, 2 times daily  . traZODone (DESYREL) 50 mg, Oral, Daily at bedtime    Radiology:  No results found. Cardiac Studies:   Coronary angiogram 10/04/2015:  Left main mild calcification. LAD tortuous origin, occluded in the proximal segment. Diagonal 2 is moderate-sized, has proximal 50% stenosis. S/P thrombectomy & DES overlapping 3.5 x 18 and 3.5 x 12 mm Xience Alpine stents, 100% reduced to 0%. Ramus intermediate has a tortuous origin. Circumflex mild disease. Right coronary artery mild diffuse disease. Markedly elevated LVEDP, 31 mmHg.  Carotid artery duplex  01/19/2019: Stenosis in the right internal carotid artery (16-49%). Stenosis in the right external carotid artery (<50%). Stenosis in the left internal carotid artery (16-49%). Antegrade right vertebral artery flow. Antegrade left vertebral artery flow. Compared to the study done on 10/26/2015, bilateral ICA stenosis is new. Follow up in one year is appropriate if clinically indicated.  Lower Extremity  Arterial Duplex 01/19/2019: No hemodynamically significant stenoses are identified in the bilateral lower extremity arterial system.  This exam reveals normal perfusion of the right (ABI 1.04) and left (ABI 1.00) lower extremity. Mildly abnormal waveform at the level of the ankle suggests mild small vessel disease.  Lexiscan Myoview Stress Test 12/29/2018: Lexiscan stress test was performed. Stress EKG is non-diagnostic, as this is pharmacological stress test. Perfusion images reveal a very large size anterior, anteroseptal, apical and apical inferior transmural scar with moderate amount of ischemia especially at the basal septal region from base to mid ventricle.  Dynamic gated images reveal anterior, anteroseptal and apical akinesis. Stress LV EF is severely dysfunctional 29%.  High risk study. No prior studies for comparision.  Echocardiogram 04/14/2019:  Left ventricle cavity is normal in size. Apical akinesis. Mildly depressed  LV systolic function with EF 50%. Doppler evidence of grade I (impaired)  diastolic dysfunction, normal LAP. Calculated EF 50%.  Left atrial cavity is mildly dilated. Aneurysmal interatrial septum  without 2D or color Doppler evidence of interatrial shunt.  Mild (Grade I) mitral regurgitation.  Normal right atrial pressure.  No significant change compared to previous study on 06/12/2017, in 2017, EF 35%.  EKG:  EKG 07/27/2019: Normal sinus rhythm at rate of 88 bpm, left atrial enlargement, left axis deviation, left anterior fascicular block.  Poor R wave progression, cannot exclude anterolateral infarct old.  No evidence of ischemia.   No significant change from EKG 01/07/2019.  Assessment     ICD-10-CM   1. Coronary artery disease involving native coronary artery of native heart without angina pectoris  I25.10 EKG 12-Lead    nitroGLYCERIN (NITROSTAT) 0.4 MG SL tablet  2. Dyspnea on exertion  R06.00   3. Essential hypertension  I10     Recommendations:   Meds  ordered this encounter  Medications  . nitroGLYCERIN (NITROSTAT) 0.4 MG SL tablet    Sig: Place 1 tablet (0.4 mg total) under the tongue every 5 (five) minutes as needed for up to 25 days for chest pain.    Dispense:  25 tablet    Refill:  3    Sonia Morales  is a 64 y.o. Caucasian  female  with CAD, history of LAD stenting in 2017, tobacco use disorder in the past quit since angioplasty, asymptomatic bilateral carotid artery stenosis, hypertension, hyperlipidemia, uncontrolled diabetes mellitus and ischemic cardiomyopathy. Due to worsening dyspnea, underwent nuclear stress test in September 2020 which revealed large anterior and apical and inferior transmural scar with moderate amount of peri-infarct ischemia with EF 29% felt to be high risk,   cardiac catheterization was postponed due to COVID-19 pandemic and also she had developed community-acquired pneumonia.  Medical therapy was recommended.  She now presents for follow-up.   She has been exercising regularly, has kept her weight off and is completely quit smoking.  I reviewed her labs, lipids in excellent control, blood pressure is also well controlled and there is no change in physical exam.  She does have mild PAD but she remains asymptomatic without symptoms of claudication.  She also has mild peripheral neuropathy from uncontrolled diabetes which is also improving.  No changes in the medications were done today.    S/L NTG was prescribed and explained how to and when to use it and to notify us if there is change in frequency of use.  Given her normal LV EF by echocardiogram, although nuclear stress test reveals anterolateral scar with very mild peri-infarct ischemia and high risk, will continue medical therapy for now. DM control and weight loss stress to the patient.    Yates Decamp, MD, [facility] 07/27/2019, 4:34 PM [facility] [place] Office: [phone]

## 2019-08-12 DIAGNOSIS — M47812 Spondylosis without myelopathy or radiculopathy, cervical region: Secondary | ICD-10-CM | POA: Diagnosis not present

## 2019-08-12 DIAGNOSIS — G4700 Insomnia, unspecified: Secondary | ICD-10-CM | POA: Diagnosis not present

## 2019-08-12 DIAGNOSIS — M47816 Spondylosis without myelopathy or radiculopathy, lumbar region: Secondary | ICD-10-CM | POA: Diagnosis not present

## 2019-08-12 DIAGNOSIS — G894 Chronic pain syndrome: Secondary | ICD-10-CM | POA: Diagnosis not present

## 2019-09-09 DIAGNOSIS — M47812 Spondylosis without myelopathy or radiculopathy, cervical region: Secondary | ICD-10-CM | POA: Diagnosis not present

## 2019-09-09 DIAGNOSIS — M47816 Spondylosis without myelopathy or radiculopathy, lumbar region: Secondary | ICD-10-CM | POA: Diagnosis not present

## 2019-09-09 DIAGNOSIS — G4700 Insomnia, unspecified: Secondary | ICD-10-CM | POA: Diagnosis not present

## 2019-09-09 DIAGNOSIS — G894 Chronic pain syndrome: Secondary | ICD-10-CM | POA: Diagnosis not present

## 2019-10-21 ENCOUNTER — Other Ambulatory Visit: Payer: Self-pay | Admitting: Cardiology

## 2019-10-29 DIAGNOSIS — G4700 Insomnia, unspecified: Secondary | ICD-10-CM | POA: Diagnosis not present

## 2019-10-29 DIAGNOSIS — M47816 Spondylosis without myelopathy or radiculopathy, lumbar region: Secondary | ICD-10-CM | POA: Diagnosis not present

## 2019-10-29 DIAGNOSIS — G894 Chronic pain syndrome: Secondary | ICD-10-CM | POA: Diagnosis not present

## 2019-10-29 DIAGNOSIS — M47812 Spondylosis without myelopathy or radiculopathy, cervical region: Secondary | ICD-10-CM | POA: Diagnosis not present

## 2019-12-10 DIAGNOSIS — G894 Chronic pain syndrome: Secondary | ICD-10-CM | POA: Diagnosis not present

## 2019-12-10 DIAGNOSIS — M47812 Spondylosis without myelopathy or radiculopathy, cervical region: Secondary | ICD-10-CM | POA: Diagnosis not present

## 2019-12-10 DIAGNOSIS — G4700 Insomnia, unspecified: Secondary | ICD-10-CM | POA: Diagnosis not present

## 2019-12-10 DIAGNOSIS — M47816 Spondylosis without myelopathy or radiculopathy, lumbar region: Secondary | ICD-10-CM | POA: Diagnosis not present

## 2019-12-29 DIAGNOSIS — Z7189 Other specified counseling: Secondary | ICD-10-CM | POA: Diagnosis not present

## 2019-12-29 DIAGNOSIS — K2970 Gastritis, unspecified, without bleeding: Secondary | ICD-10-CM | POA: Diagnosis not present

## 2020-01-07 DIAGNOSIS — G894 Chronic pain syndrome: Secondary | ICD-10-CM | POA: Diagnosis not present

## 2020-01-07 DIAGNOSIS — M47812 Spondylosis without myelopathy or radiculopathy, cervical region: Secondary | ICD-10-CM | POA: Diagnosis not present

## 2020-01-07 DIAGNOSIS — Z79891 Long term (current) use of opiate analgesic: Secondary | ICD-10-CM | POA: Diagnosis not present

## 2020-01-07 DIAGNOSIS — M47816 Spondylosis without myelopathy or radiculopathy, lumbar region: Secondary | ICD-10-CM | POA: Diagnosis not present

## 2020-01-07 DIAGNOSIS — G4700 Insomnia, unspecified: Secondary | ICD-10-CM | POA: Diagnosis not present

## 2020-01-25 ENCOUNTER — Other Ambulatory Visit: Payer: Medicare Other

## 2020-01-29 ENCOUNTER — Ambulatory Visit: Payer: Medicare Other | Admitting: Cardiology

## 2020-02-04 ENCOUNTER — Other Ambulatory Visit: Payer: Self-pay | Admitting: Cardiology

## 2020-02-04 DIAGNOSIS — M47816 Spondylosis without myelopathy or radiculopathy, lumbar region: Secondary | ICD-10-CM | POA: Diagnosis not present

## 2020-02-04 DIAGNOSIS — G894 Chronic pain syndrome: Secondary | ICD-10-CM | POA: Diagnosis not present

## 2020-02-04 DIAGNOSIS — G4700 Insomnia, unspecified: Secondary | ICD-10-CM | POA: Diagnosis not present

## 2020-02-04 DIAGNOSIS — M47812 Spondylosis without myelopathy or radiculopathy, cervical region: Secondary | ICD-10-CM | POA: Diagnosis not present

## 2020-02-05 ENCOUNTER — Ambulatory Visit: Payer: Medicare Other

## 2020-02-05 ENCOUNTER — Other Ambulatory Visit: Payer: Self-pay

## 2020-02-05 DIAGNOSIS — I6523 Occlusion and stenosis of bilateral carotid arteries: Secondary | ICD-10-CM

## 2020-02-11 DIAGNOSIS — F17210 Nicotine dependence, cigarettes, uncomplicated: Secondary | ICD-10-CM | POA: Diagnosis not present

## 2020-02-11 DIAGNOSIS — D649 Anemia, unspecified: Secondary | ICD-10-CM | POA: Diagnosis not present

## 2020-02-11 DIAGNOSIS — D520 Dietary folate deficiency anemia: Secondary | ICD-10-CM | POA: Diagnosis not present

## 2020-02-11 DIAGNOSIS — K2100 Gastro-esophageal reflux disease with esophagitis, without bleeding: Secondary | ICD-10-CM | POA: Diagnosis not present

## 2020-02-11 DIAGNOSIS — D511 Vitamin B12 deficiency anemia due to selective vitamin B12 malabsorption with proteinuria: Secondary | ICD-10-CM | POA: Diagnosis not present

## 2020-02-11 DIAGNOSIS — E1142 Type 2 diabetes mellitus with diabetic polyneuropathy: Secondary | ICD-10-CM | POA: Diagnosis not present

## 2020-02-11 DIAGNOSIS — E782 Mixed hyperlipidemia: Secondary | ICD-10-CM | POA: Diagnosis not present

## 2020-02-11 DIAGNOSIS — I10 Essential (primary) hypertension: Secondary | ICD-10-CM | POA: Diagnosis not present

## 2020-02-11 DIAGNOSIS — K529 Noninfective gastroenteritis and colitis, unspecified: Secondary | ICD-10-CM | POA: Diagnosis not present

## 2020-02-25 ENCOUNTER — Other Ambulatory Visit: Payer: Self-pay

## 2020-02-25 ENCOUNTER — Encounter: Payer: Self-pay | Admitting: Cardiology

## 2020-02-25 ENCOUNTER — Ambulatory Visit: Payer: Medicare Other | Admitting: Cardiology

## 2020-02-25 VITALS — BP 120/59 | HR 92 | Resp 16 | Ht 63.0 in | Wt 182.0 lb

## 2020-02-25 DIAGNOSIS — I10 Essential (primary) hypertension: Secondary | ICD-10-CM

## 2020-02-25 DIAGNOSIS — I2510 Atherosclerotic heart disease of native coronary artery without angina pectoris: Secondary | ICD-10-CM | POA: Diagnosis not present

## 2020-02-25 DIAGNOSIS — I255 Ischemic cardiomyopathy: Secondary | ICD-10-CM | POA: Diagnosis not present

## 2020-02-25 DIAGNOSIS — I6523 Occlusion and stenosis of bilateral carotid arteries: Secondary | ICD-10-CM | POA: Diagnosis not present

## 2020-02-25 NOTE — Progress Notes (Signed)
Primary Physician/Referring:  Caryl Bis, MD  Patient ID: Sonia Morales, female    DOB: [birth year], 64 y.o.   MRN: [id number]  Chief Complaint  Patient presents with  . Coronary Artery Disease  . Hypertension  . Carotid Stenosis  . Follow-up    6 month   HPI:    Sonia Morales  is a 64 y.o. Caucasian female  with CAD, history of LAD stenting in 2017, tobacco use disorder in the past quit since angioplasty, asymptomatic bilateral carotid artery stenosis, hypertension, hyperlipidemia, uncontrolled diabetes mellitus insulin dependent and ischemic cardiomyopathy.  Patient underwent nuclear stress testing in September 2020 which revealed large anterior and apical and inferior transmural scar with moderate amount of peri-infarct ischemia and EF of 29%.  As she has remained asymptomatic, medical therapy recommended.  Patient presents for 88-monthfollow-up of CAD.  She is presently doing well without recurrence of chest pain.  Denies palpitations, PND, orthopnea, dyspnea, syncope, near syncope, symptoms suggestive of TIA or stroke.  She reports no formal exercise routine, however she does watch her young grandchild who she states keeps her active.  Denies symptoms of claudication.  She continues to follow with Dr. TGar Pontofor diabetes management.  Reportedly her last A1c was 13.4%.    Past Medical History:  Diagnosis Date  . [place] [place]  . Acute MI anterior wall first episode care ([facility] 10/04/2015   Coronary angiogram 10/04/2015: Occluded proximal LAD, superior takeoff of ramus intermediate, large circumflex, moderate to large RCA with mild diffuse disease. S/P thrombectomy, overlapping stents to the proximal LAD with 3.5 x 18, 3.5 x 12 mm Xience DES, 100% to 0%.   .Marland KitchenCAD (coronary artery disease), native coronary artery 10/11/2015   Coronary angiogram 10/04/2015: Left main mild calcification. LAD tortuous origin, occluded in the proximal segment. Diagonal 2 is  moderate-sized, has proximal 50% stenosis. S/P thrombectomy & DES overlapping 3.5 x 18 and 3.5 x 12 mm Xience Alpine stents,   Ramus intermediate has a tortuous origin. Circumflex mild disease. Right coronary artery mild diffuse disease  . Colitis   . DM (diabetes mellitus), type 2, uncontrolled with complications ([facility] 22/84/1324 . H/O tobacco use, presenting hazards to health 06/12/2018   Quit in 2017 after MI  . Hypercholesteremia 06/12/2018   Past Surgical History:  Procedure Laterality Date  . CARDIAC CATHETERIZATION N/A 10/04/2015   Procedure: Left Heart Cath and Coronary Angiography;  Surgeon: JAdrian Prows MD;  [facility];  Service: Cardiovascular;  Laterality: N/A;  . CARDIAC CATHETERIZATION  10/04/2015   Procedure: Coronary Stent Intervention;  Surgeon: JAdrian Prows MD;  [facility];  Service: Cardiovascular;;  . PERIPHERAL VASCULAR CATHETERIZATION  10/04/2015   Procedure: Thrombectomy;  Surgeon: JAdrian Prows MD;  [facility];  Service: Cardiovascular;;   Social History   Tobacco Use  . Smoking status: Former Smoker    Packs/day: 1.00    Years: 30.00    Pack years: 30.00    Types: Cigarettes    Quit date: 10/04/2018    Years since quitting: 1.3  . Smokeless tobacco: Never Used  Substance Use Topics  . Alcohol use: Never   Marital Status: Married   ROS  Review of Systems  Constitutional: Negative for malaise/fatigue and weight gain.  Cardiovascular: Negative for chest pain, claudication, dyspnea on exertion, leg swelling, near-syncope, orthopnea, palpitations, paroxysmal nocturnal dyspnea and syncope.  Respiratory: Negative for shortness of breath.  Hematologic/Lymphatic: Does not bruise/bleed easily.  Musculoskeletal: Positive for back pain, joint pain and neck pain.  Gastrointestinal: Negative for constipation and melena.  Neurological: Positive for paresthesias (feet). Negative for dizziness and weakness.   Psychiatric/Behavioral: Positive for depression.  All other systems reviewed and are negative.  Objective   Vitals with BMI 02/25/2020 07/27/2019 03/04/2019  Height 5' 3"  5' 3"  5' 3"   Weight 182 lbs 181 lbs 8 oz 181 lbs 2 oz  BMI 32.25 81.15 72.62  Systolic 035 597 99  Diastolic 59 60 57  Pulse 92 89 80    Physical Exam Constitutional:      General: She is not in acute distress.    Appearance: She is well-developed.     Comments: Mildly obese  HENT:     Head: Atraumatic.  Cardiovascular:     Rate and Rhythm: Normal rate and regular rhythm.     Pulses:          Carotid pulses are on the right side with bruit and on the left side with bruit.      Femoral pulses are 1+ on the right side and 1+ on the left side with bruit.      Popliteal pulses are 2+ on the right side and 2+ on the left side.       Dorsalis pedis pulses are 2+ on the right side and 1+ on the left side.       Posterior tibial pulses are 0 on the right side and 0 on the left side.     Heart sounds: Murmur heard.  Midsystolic murmur is present with a grade of 2/6 at the upper right sternal border.  No gallop.      Comments: No edema. Neck scar from cervical fusion left seen. No JVD. Pulmonary:     Effort: Pulmonary effort is normal.     Breath sounds: Normal breath sounds.  Abdominal:     General: Bowel sounds are normal.     Palpations: Abdomen is soft.    Laboratory examination:    No results for input(s): NA, K, CL, CO2, GLUCOSE, BUN, CREATININE, CALCIUM, GFRNONAA, GFRAA in the last 8760 hours. CrCl cannot be calculated (Patient's most recent lab result is older than the maximum 21 days allowed.).  CMP Latest Ref Rng & Units 01/13/2019 10/05/2015 10/04/2015  Glucose 65 - 99 mg/dL 295(H) 259(H) -  BUN 8 - 27 mg/dL 26 10 -  Creatinine 0.57 - 1.00 mg/dL 1.12(H) 0.56 0.54  Sodium 134 - 144 mmol/L 137 137 -  Potassium 3.5 - 5.2 mmol/L 4.8 3.8 -  Chloride 96 - 106 mmol/L 100 103 -  CO2 20 - 29 mmol/L 22 25  -  Calcium 8.7 - 10.3 mg/dL 9.2 9.2 -  Total Protein 6.0 - 8.5 g/dL 6.7 - -  Total Bilirubin 0.0 - 1.2 mg/dL <0.2 - -  Alkaline Phos 39 - 117 IU/L 107 - -  AST 0 - 40 IU/L 14 - -  ALT 0 - 32 IU/L 12 - -   CBC Latest Ref Rng & Units 01/13/2019 10/05/2015 10/04/2015  WBC 3.4 - 10.8 x10E3/uL 7.0 16.2(H) 17.0(H)  Hemoglobin 11.1 - 15.9 g/dL 10.8(L) 12.0 12.1  Hematocrit 34.0 - 46.6 % 33.6(L) 36.8 36.7  Platelets 150 - 450 x10E3/uL 222 186 197   Lipid Panel     Component Value Date/Time   CHOL 121 01/13/2019 1349   TRIG 82 01/13/2019 1349   HDL 46 01/13/2019 1349   CHOLHDL  3.0 10/04/2015 1814   VLDL 13 10/04/2015 1814   LDLCALC 59 01/13/2019 1349   LDLDIRECT 57 01/13/2019 1349   HEMOGLOBIN A1C Lab Results  Component Value Date   HGBA1C 10.6 (H) 10/04/2015   MPG 258 10/04/2015   TSH No results for input(s): TSH in the last 8760 hours.  External labs:  02/12/2020: HDL 51, total cholesterol 115, triglycerides 58, calculated LDL 52.4 A1c 13.4% BUN 13, creatinine 0.73, EGFR 89  A1C 11.600 % 11/10/2018  Medications and allergies  No Known Allergies   Current Outpatient Medications  Medication Instructions  . aspirin 81 mg, Oral, Daily  . atorvastatin (LIPITOR) 80 mg, Oral, Daily-1800  . carvedilol (COREG) 6.25 MG tablet TAKE ONE TABLET BY MOUTH TWICE DAILY  . Cyanocobalamin (VITAMIN B 12 PO) 1,000 Units, Oral, 2 times daily  . FLUoxetine (PROZAC) 40 mg, Oral, Daily  . insulin aspart protamine- aspart (NOVOLOG MIX 70/30) (70-30) 100 UNIT/ML injection 43 Units, Subcutaneous, 2 times daily  . nitroGLYCERIN (NITROSTAT) 0.4 mg, Sublingual, Every 5 min PRN  . Oxycodone HCl 10 mg, Oral, 3 times daily PRN  . sacubitril-valsartan (ENTRESTO) 97-103 MG 1 tablet, Oral, 2 times daily  . traZODone (DESYREL) 50 mg, Oral, Daily at bedtime    Radiology:  No results found.   Cardiac Studies:   Coronary angiogram 10/04/2015:  Left main mild calcification. LAD tortuous origin, occluded  in the proximal segment. Diagonal 2 is moderate-sized, has proximal 50% stenosis. S/P thrombectomy & DES overlapping 3.5 x 18 and 3.5 x 12 mm Xience Alpine stents, 100% reduced to 0%. Ramus intermediate has a tortuous origin. Circumflex mild disease. Right coronary artery mild diffuse disease. Markedly elevated LVEDP, 31 mmHg.  Lower Extremity Arterial Duplex 01/19/2019: No hemodynamically significant stenoses are identified in the bilateral lower extremity arterial system.  This exam reveals normal perfusion of the right (ABI 1.04) and left (ABI 1.00) lower extremity. Mildly abnormal waveform at the level of the ankle suggests mild small vessel disease.  Lexiscan Myoview Stress Test 12/29/2018: Lexiscan stress test was performed. Stress EKG is non-diagnostic, as this is pharmacological stress test. Perfusion images reveal a very large size anterior, anteroseptal, apical and apical inferior transmural scar with moderate amount of ischemia especially at the basal septal region from base to mid ventricle.  Dynamic gated images reveal anterior, anteroseptal and apical akinesis. Stress LV EF is severely dysfunctional 29%.  High risk study. No prior studies for comparision.  Echocardiogram 04/14/2019:  Left ventricle cavity is normal in size. Apical akinesis. Mildly depressed  LV systolic function with EF 50%. Doppler evidence of grade I (impaired)  diastolic dysfunction, normal LAP. Calculated EF 50%.  Left atrial cavity is mildly dilated. Aneurysmal interatrial septum  without 2D or color Doppler evidence of interatrial shunt.  Mild (Grade I) mitral regurgitation.  Normal right atrial pressure.  No significant change compared to previous study on 06/12/2017, in 2017, EF 35%.  Carotid artery duplex 02/05/2020:  Stenosis in the right internal carotid artery (16-49%). Stenosis in the right external carotid artery (<50%).  Stenosis in the left internal carotid artery (50-69%). Stenosis in the left  external carotid artery (<50%).  Antegrade right vertebral artery flow. Antegrade left vertebral artery flow.  No significant change since 01/19/2019. Follow up in six months is appropriate if clinically indicated.  EKG   EKG 02/25/2020: Sinus rhythm at a rate of 84 bpm, left atrial enlargement, left axis deviation, left anterior fascicular block.  Poor R wave progression, cannot exclude anterior septal  infarct old.  Nonspecific T wave abnormality.  No evidence of ischemia.  Compared to EKG 07/27/2019, no significant change.  Assessment     ICD-10-CM   1. Coronary artery disease involving native coronary artery of native heart without angina pectoris  I25.10 EKG 12-Lead  2. Essential hypertension  I10 PCV ECHOCARDIOGRAM COMPLETE  3. Ischemic cardiomyopathy  I25.5 PCV ECHOCARDIOGRAM COMPLETE  4. Asymptomatic bilateral carotid artery stenosis  I65.23 PCV CAROTID DUPLEX (BILATERAL)   No orders of the defined types were placed in this encounter.  Medications Discontinued During This Encounter  Medication Reason  . meloxicam (MOBIC) 7.5 MG tablet Patient Preference    Recommendations:    ELEXA KIVI  is a 64 y.o. Caucasian female  with CAD, history of LAD stenting in 2017, tobacco use disorder in the past quit since angioplasty, asymptomatic bilateral carotid artery stenosis, hypertension, hyperlipidemia, uncontrolled diabetes mellitus insulin dependent and ischemic cardiomyopathy.  Patient underwent nuclear stress testing in September 2020 which revealed large anterior and apical and inferior transmural scar with moderate amount of peri-infarct ischemia and EF of 29%.  As she has remained asymptomatic, medical therapy recommended.  Patient presents for 83-monthfollow-up of CAD, hypertension, hyperlipidemia, and asymptomatic bilateral carotid artery stenosis.  I reviewed and discussed results of recent carotid artery duplex 02/05/2020.  There has been no significant change compared to  01/19/2019.  We will repeat bilateral carotid artery duplex in 6 months.  In regard to blood pressure it is currently well controlled.  I personally reviewed external labs and lipids are under excellent control.  We will continue current guideline directed medical therapy including aspirin, atorvastatin, carvedilol, Entresto, and as needed nitroglycerin.  Patient has not needed to take sublingual nitroglycerin, however again discussed with her regarding how and when to use it and to notify [place] is a change in frequency of use.  As patient's LVEF by echocardiogram 04/14/2019 was normal and she continues to remain asymptomatic, will not schedule for cardiac catheterization at this time, despite high risk nuclear stress test in 2020.  Discussed at length with patient regarding signs and symptoms of which she should notify the office and that may warrant scheduling for cardiac catheterization.  Patient verbalized understanding and agreement.  Patient's physical exam today noted systolic murmur which has not been appreciated on prior exams.  Will obtain echocardiogram at this time to evaluate.  By exam patient likely has mild PAD, however she remains asymptomatic without symptoms of claudication.  She does report bilateral paresthesias in her feet, highly suggestive of diabetic neuropathy.  Again stressed weight loss and diet and lifestyle modifications to patient.  Upon review of external labs, most recent A1c was 13.4%.  Discussed with patient regarding increased cardiovascular risk due to uncontrolled diabetes.  Patient was seen in collaboration with Dr. GEinar Gip He also reviewed patient's chart and Dr. GEinar Gipis in agreement of the plan.    CAlethia Berthold PA-C 02/25/2020, 3:58 PM Office: [phone]

## 2020-03-03 ENCOUNTER — Other Ambulatory Visit: Payer: Medicare Other

## 2020-03-09 DIAGNOSIS — M47816 Spondylosis without myelopathy or radiculopathy, lumbar region: Secondary | ICD-10-CM | POA: Diagnosis not present

## 2020-03-09 DIAGNOSIS — G894 Chronic pain syndrome: Secondary | ICD-10-CM | POA: Diagnosis not present

## 2020-03-09 DIAGNOSIS — G4700 Insomnia, unspecified: Secondary | ICD-10-CM | POA: Diagnosis not present

## 2020-03-09 DIAGNOSIS — M47812 Spondylosis without myelopathy or radiculopathy, cervical region: Secondary | ICD-10-CM | POA: Diagnosis not present

## 2020-03-15 ENCOUNTER — Other Ambulatory Visit: Payer: Medicare Other

## 2020-03-18 DIAGNOSIS — E782 Mixed hyperlipidemia: Secondary | ICD-10-CM | POA: Diagnosis not present

## 2020-03-18 DIAGNOSIS — Z20828 Contact with and (suspected) exposure to other viral communicable diseases: Secondary | ICD-10-CM | POA: Diagnosis not present

## 2020-03-18 DIAGNOSIS — K2100 Gastro-esophageal reflux disease with esophagitis, without bleeding: Secondary | ICD-10-CM | POA: Diagnosis not present

## 2020-03-18 DIAGNOSIS — I10 Essential (primary) hypertension: Secondary | ICD-10-CM | POA: Diagnosis not present

## 2020-03-18 DIAGNOSIS — E1142 Type 2 diabetes mellitus with diabetic polyneuropathy: Secondary | ICD-10-CM | POA: Diagnosis not present

## 2020-03-18 DIAGNOSIS — J441 Chronic obstructive pulmonary disease with (acute) exacerbation: Secondary | ICD-10-CM | POA: Diagnosis not present

## 2020-03-18 DIAGNOSIS — E1165 Type 2 diabetes mellitus with hyperglycemia: Secondary | ICD-10-CM | POA: Diagnosis not present

## 2020-03-22 DIAGNOSIS — I10 Essential (primary) hypertension: Secondary | ICD-10-CM | POA: Diagnosis not present

## 2020-03-22 DIAGNOSIS — F331 Major depressive disorder, recurrent, moderate: Secondary | ICD-10-CM | POA: Diagnosis not present

## 2020-03-22 DIAGNOSIS — E1165 Type 2 diabetes mellitus with hyperglycemia: Secondary | ICD-10-CM | POA: Diagnosis not present

## 2020-03-22 DIAGNOSIS — Z23 Encounter for immunization: Secondary | ICD-10-CM | POA: Diagnosis not present

## 2020-03-22 DIAGNOSIS — E782 Mixed hyperlipidemia: Secondary | ICD-10-CM | POA: Diagnosis not present

## 2020-03-22 DIAGNOSIS — Z0001 Encounter for general adult medical examination with abnormal findings: Secondary | ICD-10-CM | POA: Diagnosis not present

## 2020-03-22 DIAGNOSIS — I25119 Atherosclerotic heart disease of native coronary artery with unspecified angina pectoris: Secondary | ICD-10-CM | POA: Diagnosis not present

## 2020-03-22 DIAGNOSIS — E1142 Type 2 diabetes mellitus with diabetic polyneuropathy: Secondary | ICD-10-CM | POA: Diagnosis not present

## 2020-03-29 ENCOUNTER — Ambulatory Visit: Payer: Medicare Other

## 2020-03-29 ENCOUNTER — Other Ambulatory Visit: Payer: Self-pay

## 2020-03-29 DIAGNOSIS — I255 Ischemic cardiomyopathy: Secondary | ICD-10-CM | POA: Diagnosis not present

## 2020-03-29 DIAGNOSIS — I10 Essential (primary) hypertension: Secondary | ICD-10-CM

## 2020-04-04 NOTE — Progress Notes (Signed)
Spoke to pt regarding echo pt is aware of her results

## 2020-04-04 NOTE — Progress Notes (Signed)
Please inform patient echo is stable compared to 2020. LVEF 45-50%. Will continue to monitor.

## 2020-04-06 DIAGNOSIS — M47816 Spondylosis without myelopathy or radiculopathy, lumbar region: Secondary | ICD-10-CM | POA: Diagnosis not present

## 2020-04-06 DIAGNOSIS — M47812 Spondylosis without myelopathy or radiculopathy, cervical region: Secondary | ICD-10-CM | POA: Diagnosis not present

## 2020-04-06 DIAGNOSIS — G894 Chronic pain syndrome: Secondary | ICD-10-CM | POA: Diagnosis not present

## 2020-04-06 DIAGNOSIS — G4700 Insomnia, unspecified: Secondary | ICD-10-CM | POA: Diagnosis not present

## 2020-05-05 DIAGNOSIS — R059 Cough, unspecified: Secondary | ICD-10-CM | POA: Diagnosis not present

## 2020-05-05 DIAGNOSIS — Z20828 Contact with and (suspected) exposure to other viral communicable diseases: Secondary | ICD-10-CM | POA: Diagnosis not present

## 2020-05-10 ENCOUNTER — Other Ambulatory Visit: Payer: Self-pay | Admitting: Cardiology

## 2020-05-18 DIAGNOSIS — M47816 Spondylosis without myelopathy or radiculopathy, lumbar region: Secondary | ICD-10-CM | POA: Diagnosis not present

## 2020-05-18 DIAGNOSIS — M47812 Spondylosis without myelopathy or radiculopathy, cervical region: Secondary | ICD-10-CM | POA: Diagnosis not present

## 2020-05-18 DIAGNOSIS — G4700 Insomnia, unspecified: Secondary | ICD-10-CM | POA: Diagnosis not present

## 2020-05-18 DIAGNOSIS — G894 Chronic pain syndrome: Secondary | ICD-10-CM | POA: Diagnosis not present

## 2020-05-31 ENCOUNTER — Other Ambulatory Visit: Payer: Self-pay | Admitting: Cardiology

## 2020-05-31 DIAGNOSIS — M19041 Primary osteoarthritis, right hand: Secondary | ICD-10-CM

## 2020-06-15 ENCOUNTER — Other Ambulatory Visit: Payer: Self-pay

## 2020-06-15 MED ORDER — SACUBITRIL-VALSARTAN 97-103 MG PO TABS
1.0000 | ORAL_TABLET | Freq: Two times a day (BID) | ORAL | 3 refills | Status: DC
Start: 2020-06-15 — End: 2021-01-12

## 2020-06-16 DIAGNOSIS — G4700 Insomnia, unspecified: Secondary | ICD-10-CM | POA: Diagnosis not present

## 2020-06-16 DIAGNOSIS — G894 Chronic pain syndrome: Secondary | ICD-10-CM | POA: Diagnosis not present

## 2020-06-16 DIAGNOSIS — M47812 Spondylosis without myelopathy or radiculopathy, cervical region: Secondary | ICD-10-CM | POA: Diagnosis not present

## 2020-06-16 DIAGNOSIS — M47816 Spondylosis without myelopathy or radiculopathy, lumbar region: Secondary | ICD-10-CM | POA: Diagnosis not present

## 2020-07-12 ENCOUNTER — Telehealth: Payer: Self-pay

## 2020-07-12 NOTE — Telephone Encounter (Signed)
Error

## 2020-07-14 DIAGNOSIS — M47816 Spondylosis without myelopathy or radiculopathy, lumbar region: Secondary | ICD-10-CM | POA: Diagnosis not present

## 2020-07-14 DIAGNOSIS — M47812 Spondylosis without myelopathy or radiculopathy, cervical region: Secondary | ICD-10-CM | POA: Diagnosis not present

## 2020-07-14 DIAGNOSIS — G894 Chronic pain syndrome: Secondary | ICD-10-CM | POA: Diagnosis not present

## 2020-07-14 DIAGNOSIS — G4700 Insomnia, unspecified: Secondary | ICD-10-CM | POA: Diagnosis not present

## 2020-08-12 DIAGNOSIS — G4700 Insomnia, unspecified: Secondary | ICD-10-CM | POA: Diagnosis not present

## 2020-08-12 DIAGNOSIS — M47816 Spondylosis without myelopathy or radiculopathy, lumbar region: Secondary | ICD-10-CM | POA: Diagnosis not present

## 2020-08-12 DIAGNOSIS — G894 Chronic pain syndrome: Secondary | ICD-10-CM | POA: Diagnosis not present

## 2020-08-12 DIAGNOSIS — M47812 Spondylosis without myelopathy or radiculopathy, cervical region: Secondary | ICD-10-CM | POA: Diagnosis not present

## 2020-08-19 ENCOUNTER — Other Ambulatory Visit: Payer: Medicare Other

## 2020-08-22 ENCOUNTER — Other Ambulatory Visit: Payer: Self-pay

## 2020-08-22 MED ORDER — CARVEDILOL 6.25 MG PO TABS: 6.2500 mg | ORAL_TABLET | Freq: Two times a day (BID) | ORAL | 0 refills | Status: DC

## 2020-08-23 ENCOUNTER — Other Ambulatory Visit: Payer: Medicare Other

## 2020-08-29 ENCOUNTER — Ambulatory Visit: Payer: Medicare Other | Admitting: Cardiology

## 2020-08-29 ENCOUNTER — Ambulatory Visit: Payer: Medicare Other | Admitting: Student

## 2020-08-30 ENCOUNTER — Other Ambulatory Visit: Payer: Self-pay

## 2020-08-30 ENCOUNTER — Ambulatory Visit: Payer: Medicare Other

## 2020-08-30 DIAGNOSIS — I6523 Occlusion and stenosis of bilateral carotid arteries: Secondary | ICD-10-CM

## 2020-08-31 ENCOUNTER — Other Ambulatory Visit: Payer: Self-pay | Admitting: Student

## 2020-08-31 DIAGNOSIS — E1165 Type 2 diabetes mellitus with hyperglycemia: Secondary | ICD-10-CM | POA: Diagnosis not present

## 2020-08-31 DIAGNOSIS — E6609 Other obesity due to excess calories: Secondary | ICD-10-CM | POA: Diagnosis not present

## 2020-08-31 DIAGNOSIS — I25111 Atherosclerotic heart disease of native coronary artery with angina pectoris with documented spasm: Secondary | ICD-10-CM | POA: Diagnosis not present

## 2020-08-31 DIAGNOSIS — K2100 Gastro-esophageal reflux disease with esophagitis, without bleeding: Secondary | ICD-10-CM | POA: Diagnosis not present

## 2020-08-31 DIAGNOSIS — Z23 Encounter for immunization: Secondary | ICD-10-CM | POA: Diagnosis not present

## 2020-08-31 DIAGNOSIS — I10 Essential (primary) hypertension: Secondary | ICD-10-CM | POA: Diagnosis not present

## 2020-08-31 DIAGNOSIS — Z6833 Body mass index (BMI) 33.0-33.9, adult: Secondary | ICD-10-CM | POA: Diagnosis not present

## 2020-08-31 DIAGNOSIS — I6523 Occlusion and stenosis of bilateral carotid arteries: Secondary | ICD-10-CM

## 2020-08-31 DIAGNOSIS — I25119 Atherosclerotic heart disease of native coronary artery with unspecified angina pectoris: Secondary | ICD-10-CM | POA: Diagnosis not present

## 2020-08-31 DIAGNOSIS — Z7189 Other specified counseling: Secondary | ICD-10-CM | POA: Diagnosis not present

## 2020-08-31 DIAGNOSIS — F331 Major depressive disorder, recurrent, moderate: Secondary | ICD-10-CM | POA: Diagnosis not present

## 2020-08-31 DIAGNOSIS — E7849 Other hyperlipidemia: Secondary | ICD-10-CM | POA: Diagnosis not present

## 2020-08-31 DIAGNOSIS — G6289 Other specified polyneuropathies: Secondary | ICD-10-CM | POA: Diagnosis not present

## 2020-08-31 DIAGNOSIS — E1142 Type 2 diabetes mellitus with diabetic polyneuropathy: Secondary | ICD-10-CM | POA: Diagnosis not present

## 2020-08-31 DIAGNOSIS — E782 Mixed hyperlipidemia: Secondary | ICD-10-CM | POA: Diagnosis not present

## 2020-08-31 LAB — COMPREHENSIVE METABOLIC PANEL: GFR calc non Af Amer: 48

## 2020-08-31 LAB — LIPID PANEL
LDL Cholesterol: 37
Triglycerides: 83 (ref 40–160)

## 2020-08-31 LAB — BASIC METABOLIC PANEL
BUN: 24 — AB (ref 4–21)
Creatinine: 1.3 — AB (ref 0.5–1.1)

## 2020-08-31 LAB — HEMOGLOBIN A1C: Hemoglobin A1C: 11.1

## 2020-09-05 NOTE — Progress Notes (Signed)
Primary Physician/Referring:  Caryl Bis, MD  Patient ID: Sonia Morales, female    DOB: [birth year], 65 y.o.   MRN: [id number]  Chief Complaint  Patient presents with  . Follow-up    6 month   HPI:    Sonia Morales  is a 65 y.o. Caucasian female  with CAD, history of LAD stenting in 2017, tobacco use disorder in the past quit since angioplasty, asymptomatic bilateral carotid artery stenosis, hypertension, hyperlipidemia, uncontrolled diabetes mellitus insulin dependent and ischemic cardiomyopathy.  Patient underwent nuclear stress testing in September 2020 which revealed large anterior and apical and inferior transmural scar with moderate amount of peri-infarct ischemia and EF of 29%.  As she has remained asymptomatic, medical therapy recommended.  Patient presents for 6 month follow up of CAD, hypertension, and carotid disease. At last visit new systolic murmur was noted on exam, therefore it was recommended patient undergo echocardiogram.  Patient is without complaints today remains asymptomatic.  Denies chest pain, palpitations, syncope, near syncope, dizziness, dyspnea.  Denies orthopnea, PND, leg swelling.  She has no formal exercise routine, and admits that her activity has been moderately limited since last visit due to low back pain for which she has been evaluated by her PCP.  She continues to follow with Dr. Gar Ponto for management of diabetes, her A1c last week was reportedly 11%.  Past Medical History:  Diagnosis Date  . Acute anterior wall MI ([place]) 10/04/2015  . Acute MI anterior wall first episode care [facility]) 10/04/2015   Coronary angiogram 10/04/2015: Occluded proximal LAD, superior takeoff of ramus intermediate, large circumflex, moderate to large RCA with mild diffuse disease. S/P thrombectomy, overlapping stents to the proximal LAD with 3.5 x 18, 3.5 x 12 mm Xience DES, 100% to 0%.   Marland Kitchen CAD (coronary artery disease), native coronary artery 10/11/2015   Coronary angiogram  10/04/2015: Left main mild calcification. LAD tortuous origin, occluded in the proximal segment. Diagonal 2 is moderate-sized, has proximal 50% stenosis. S/P thrombectomy & DES overlapping 3.5 x 18 and 3.5 x 12 mm Xience Alpine stents,   Ramus intermediate has a tortuous origin. Circumflex mild disease. Right coronary artery mild diffuse disease  . Colitis   . DM (diabetes mellitus), type 2, uncontrolled with complications ([place]) 04/24/3233  . H/O tobacco use, presenting hazards to health 06/12/2018   Quit in 2017 after MI  . Hypercholesteremia 06/12/2018   Past Surgical History:  Procedure Laterality Date  . CARDIAC CATHETERIZATION N/A 10/04/2015   Procedure: Left Heart Cath and Coronary Angiography;  Surgeon: Adrian Prows, MD;  Location: [place] [facility];  Service: Cardiovascular;  Laterality: N/A;  . CARDIAC CATHETERIZATION  10/04/2015   Procedure: Coronary Stent Intervention;  Surgeon: Adrian Prows, MD;  Location: [place] [facility];  Service: Cardiovascular;;  . PERIPHERAL VASCULAR CATHETERIZATION  10/04/2015   Procedure: Thrombectomy;  Surgeon: Adrian Prows, MD;  Location: [place] [facility];  Service: Cardiovascular;;   Family History  Problem Relation Age of Onset  . Heart attack Mother   . Cancer Brother    Social History   Tobacco Use  . Smoking status: Former Smoker    Packs/day: 1.00    Years: 30.00    Pack years: 30.00    Types: Cigarettes    Quit date: 10/04/2018    Years since quitting: 1.9  . Smokeless tobacco: Never Used  Substance Use Topics  . Alcohol use: Never   Marital Status: Married   ROS  Review of  Systems  Constitutional: Negative for malaise/fatigue and weight gain.  Cardiovascular: Negative for chest pain, claudication, dyspnea on exertion, leg swelling, near-syncope, orthopnea, palpitations, paroxysmal nocturnal dyspnea and syncope.  Respiratory: Negative for shortness of breath.   Hematologic/Lymphatic: Does not bruise/bleed easily.  Musculoskeletal: Positive  for back pain, joint pain and neck pain.  Gastrointestinal: Negative for constipation and melena.  Neurological: Positive for paresthesias (feet). Negative for dizziness and weakness.  Psychiatric/Behavioral: Positive for depression.  All other systems reviewed and are negative.  Objective   Vitals with BMI 09/06/2020 02/25/2020 07/27/2019  Height _0  _1  _2   Weight 191 lbs 10 oz 182 lbs 181 lbs 8 oz  BMI 33.95 58.85 02.77  Systolic 96 412 878  Diastolic 58 59 60  Pulse 79 92 89    Orthostatic VS for the past 72 hrs (Last 3 readings):  Orthostatic BP Patient Position BP Location Cuff Size Orthostatic Pulse  09/06/20 1211 101/58 Standing Left Arm Normal 80  09/06/20 1210 98/55 Sitting Left Arm Normal 82  09/06/20 1207 95/55 Supine Right Arm Normal 62    Physical Exam Vitals reviewed.  Constitutional:      General: She is not in acute distress.    Appearance: She is well-developed.     Comments: Mildly obese  HENT:     Head: Normocephalic and atraumatic.  Cardiovascular:     Rate and Rhythm: Normal rate and regular rhythm.     Pulses:          Carotid pulses are on the right side with bruit and on the left side with bruit.      Femoral pulses are 1+ on the right side and 1+ on the left side with bruit.      Popliteal pulses are 2+ on the right side and 2+ on the left side.       Dorsalis pedis pulses are 2+ on the right side and 1+ on the left side.       Posterior tibial pulses are 0 on the right side and 0 on the left side.     Heart sounds: S1 normal and S2 normal. Murmur heard.   Midsystolic murmur is present with a grade of 2/6 at the upper right sternal border. No gallop.      Comments: No edema. Neck scar from cervical fusion left seen. No JVD. Pulmonary:     Effort: Pulmonary effort is normal. No respiratory distress.     Breath sounds: Normal breath sounds. No wheezing, rhonchi or rales.  Abdominal:     General: Bowel sounds are normal.     Palpations:  Abdomen is soft.  Musculoskeletal:     Right lower leg: No edema.     Left lower leg: No edema.  Skin:    General: Skin is warm and dry.  Neurological:     Mental Status: She is alert.    Laboratory examination:    No results for input(s): NA, K, CL, CO2, GLUCOSE, BUN, CREATININE, CALCIUM, GFRNONAA, GFRAA in the last 8760 hours. CrCl cannot be calculated (Patient's most recent lab result is older than the maximum 21 days allowed.).  CMP Latest Ref Rng & Units 01/13/2019 10/05/2015 10/04/2015  Glucose 65 - 99 mg/dL 295(H) 259(H) -  BUN 8 - 27 mg/dL 26 10 -  Creatinine 0.57 - 1.00 mg/dL 1.12(H) 0.56 0.54  Sodium 134 - 144 mmol/L 137 137 -  Potassium 3.5 - 5.2 mmol/L 4.8 3.8 -  Chloride 96 - 106  mmol/L 100 103 -  CO2 20 - 29 mmol/L 22 25 -  Calcium 8.7 - 10.3 mg/dL 9.2 9.2 -  Total Protein 6.0 - 8.5 g/dL 6.7 - -  Total Bilirubin 0.0 - 1.2 mg/dL <0.2 - -  Alkaline Phos 39 - 117 IU/L 107 - -  AST 0 - 40 IU/L 14 - -  ALT 0 - 32 IU/L 12 - -   CBC Latest Ref Rng & Units 01/13/2019 10/05/2015 10/04/2015  WBC 3.4 - 10.8 x10E3/uL 7.0 16.2(H) 17.0(H)  Hemoglobin 11.1 - 15.9 g/dL 10.8(L) 12.0 12.1  Hematocrit 34.0 - 46.6 % 33.6(L) 36.8 36.7  Platelets 150 - 450 x10E3/uL 222 186 197   Lipid Panel     Component Value Date/Time   CHOL 121 01/13/2019 1349   TRIG 82 01/13/2019 1349   HDL 46 01/13/2019 1349   CHOLHDL 3.0 10/04/2015 1814   VLDL 13 10/04/2015 1814   LDLCALC 59 01/13/2019 1349   LDLDIRECT 57 01/13/2019 1349   HEMOGLOBIN A1C Lab Results  Component Value Date   HGBA1C 10.6 (H) 10/04/2015   MPG 258 10/04/2015   TSH No results for input(s): TSH in the last 8760 hours.  External labs:  02/12/2020: HDL 51, total cholesterol 115, triglycerides 58, calculated LDL 52.4 A1c 13.4% BUN 13, creatinine 0.73, EGFR 89  A1C 11.600 % 11/10/2018  Medications and allergies  No Known Allergies   Current Outpatient Medications  Medication Instructions  . aspirin 81 mg, Oral, Daily   . atorvastatin (LIPITOR) 80 mg, Oral, Daily-1800  . carvedilol (COREG) 6.25 mg, Oral, 2 times daily  . FLUoxetine (PROZAC) 10 mg, Oral, Daily  . insulin aspart protamine- aspart (NOVOLOG MIX 70/30) (70-30) 100 UNIT/ML injection 43 Units, Subcutaneous, 2 times daily  . nitroGLYCERIN (NITROSTAT) 0.4 mg, Sublingual, Every 5 min PRN  . Oxycodone HCl 10 mg, Oral, 3 times daily PRN  . sacubitril-valsartan (ENTRESTO) 97-103 MG 1 tablet, Oral, 2 times daily  . traZODone (DESYREL) 50 mg, Oral, Daily at bedtime    Radiology:  No results found.   Cardiac Studies:   Coronary angiogram 10/04/2015:  Left main mild calcification. LAD tortuous origin, occluded in the proximal segment. Diagonal 2 is moderate-sized, has proximal 50% stenosis. S/P thrombectomy & DES overlapping 3.5 x 18 and 3.5 x 12 mm Xience Alpine stents, 100% reduced to 0%. Ramus intermediate has a tortuous origin. Circumflex mild disease. Right coronary artery mild diffuse disease. Markedly elevated LVEDP, 31 mmHg.  Lower Extremity Arterial Duplex 01/19/2019: No hemodynamically significant stenoses are identified in the bilateral lower extremity arterial system.  This exam reveals normal perfusion of the right (ABI 1.04) and left (ABI 1.00) lower extremity. Mildly abnormal waveform at the level of the ankle suggests mild small vessel disease.  Lexiscan Myoview Stress Test 12/29/2018: Lexiscan stress test was performed. Stress EKG is non-diagnostic, as this is pharmacological stress test. Perfusion images reveal a very large size anterior, anteroseptal, apical and apical inferior transmural scar with moderate amount of ischemia especially at the basal septal region from base to mid ventricle.  Dynamic gated images reveal anterior, anteroseptal and apical akinesis. Stress LV EF is severely dysfunctional 29%.  High risk study. No prior studies for comparision.  Echocardiogram 03/29/2020:  Mildly depressed LV systolic function with visual  EF 45-50%. Left  ventricle cavity is normal in size. Normal global wall motion. Doppler  evidence of grade I (impaired) diastolic dysfunction, elevated LAP.  No significant valvular abnormalities.  Compared to prior study dated 04/14/2019  no significant change.  Carotid artery duplex 08/30/2020:  Stenosis in the right internal carotid artery (50-69%). Stenosis in the  right external carotid artery (<50%).  Stenosis in the left internal carotid artery (16-49%). Stenosis in the  left external carotid artery (<50%).  Antegrade right vertebral artery flow. Antegrade left vertebral artery  flow.  No significant change since 02/05/2020. Follow up in six months is  appropriate if clinically indicated.  EKG   EKG 09/06/2020: Sinus rhythm at a rate of 77 bpm.  Left atrial enlargement.  Left axis, left anterior fascicular block.  Poor R wave progression, cannot exclude anteroseptal infarct old.  LVH by voltage criteria with secondary ST-T wave changes, cannot exclude lateral ischemia.  Compared to EKG 02/25/2020, ST-T wave changes more prominent in V6.  EKG 02/25/2020: Sinus rhythm at a rate of 84 bpm, left atrial enlargement, left axis deviation, left anterior fascicular block.  Poor R wave progression, cannot exclude anterior septal infarct old.  Nonspecific T wave abnormality.  No evidence of ischemia.  Compared to EKG 07/27/2019, no significant change.  Assessment     ICD-10-CM   1. Coronary artery disease involving native coronary artery of native heart without angina pectoris  I25.10 EKG 12-Lead  2. Essential hypertension  I10   3. Asymptomatic bilateral carotid artery stenosis  I65.23    No orders of the defined types were placed in this encounter.  Medications Discontinued During This Encounter  Medication Reason  . Cyanocobalamin (VITAMIN B 12 PO) Error  . FLUoxetine (PROZAC) 40 MG capsule Error    Recommendations:    Sonia Morales  is a 65 y.o. Caucasian female  with CAD, history  of LAD stenting in 2017, tobacco use disorder in the past quit since angioplasty, asymptomatic bilateral carotid artery stenosis, hypertension, hyperlipidemia, uncontrolled diabetes mellitus insulin dependent and ischemic cardiomyopathy.  Patient underwent nuclear stress testing in September 2020 which revealed large anterior and apical and inferior transmural scar with moderate amount of peri-infarct ischemia and EF of 29%.  As she has remained asymptomatic, medical therapy recommended.  Patient presents for 6 month follow up of CAD, hypertension, and carotid disease. At last visit new systolic murmur was noted on exam, therefore it was recommended patient undergo echocardiogram.  Reviewed and discussed with patient regarding results of echocardiogram and carotid artery duplex.  Echocardiogram stable compared to previous with LVEF 45-50%.  Carotid artery duplex also remained unchanged compared to previous, we will plan to repeat surveillance study in 6 months.   Blood pressure is soft in the office today, however patient is symptomatic and she is not orthostatic, therefore will not make changes to her medications at this time.  However counseled patient regarding signs and symptoms of hypotension, she will notify our office if she experiences any issues.  Lipids are well controlled at last check.  Diabetes remains uncontrolled, discussed with patient the importance of diabetic control she will continue to follow with Dr. Quillian Quince for further management.  Patient is EKG today does show more prominent ST-T wave changes in lead V6, likely related to underlying LVH however cannot exclude ischemia.  Patient does have known high risk nuclear stress test in 2020, however she remains asymptomatic and therefore shared decision was continue to monitor closely rather than proceeding with cardiac catheterization at this time.  Again discussed with patient regarding diet and lifestyle modifications including weight loss  to further reduce cardiovascular risk.  Follow-up in 6 months, sooner if needed, for CAD, hypertension, hyperlipidemia, and  bilateral carotid artery stenosis.   Alethia Berthold, PA-C 09/06/2020, 1:09 PM Office: [phone]

## 2020-09-06 ENCOUNTER — Encounter: Payer: Self-pay | Admitting: Student

## 2020-09-06 ENCOUNTER — Ambulatory Visit: Payer: Medicare Other | Admitting: Student

## 2020-09-06 ENCOUNTER — Other Ambulatory Visit: Payer: Self-pay

## 2020-09-06 VITALS — BP 96/58 | HR 79 | Temp 97.9°F | Resp 16 | Ht 63.0 in | Wt 191.6 lb

## 2020-09-06 DIAGNOSIS — I10 Essential (primary) hypertension: Secondary | ICD-10-CM | POA: Diagnosis not present

## 2020-09-06 DIAGNOSIS — I6523 Occlusion and stenosis of bilateral carotid arteries: Secondary | ICD-10-CM | POA: Diagnosis not present

## 2020-09-06 DIAGNOSIS — I2510 Atherosclerotic heart disease of native coronary artery without angina pectoris: Secondary | ICD-10-CM

## 2020-09-13 DIAGNOSIS — Z87891 Personal history of nicotine dependence: Secondary | ICD-10-CM | POA: Diagnosis not present

## 2020-09-15 ENCOUNTER — Encounter: Payer: Self-pay | Admitting: Nurse Practitioner

## 2020-09-15 ENCOUNTER — Ambulatory Visit (INDEPENDENT_AMBULATORY_CARE_PROVIDER_SITE_OTHER): Payer: Medicare Other | Admitting: Nurse Practitioner

## 2020-09-15 ENCOUNTER — Ambulatory Visit: Payer: Self-pay | Admitting: Nurse Practitioner

## 2020-09-15 ENCOUNTER — Other Ambulatory Visit: Payer: Self-pay

## 2020-09-15 VITALS — BP 118/68 | HR 77 | Ht 63.0 in | Wt 190.0 lb

## 2020-09-15 DIAGNOSIS — E782 Mixed hyperlipidemia: Secondary | ICD-10-CM | POA: Diagnosis not present

## 2020-09-15 DIAGNOSIS — E1122 Type 2 diabetes mellitus with diabetic chronic kidney disease: Secondary | ICD-10-CM | POA: Diagnosis not present

## 2020-09-15 DIAGNOSIS — Z794 Long term (current) use of insulin: Secondary | ICD-10-CM

## 2020-09-15 DIAGNOSIS — N1831 Chronic kidney disease, stage 3a: Secondary | ICD-10-CM | POA: Diagnosis not present

## 2020-09-15 DIAGNOSIS — I10 Essential (primary) hypertension: Secondary | ICD-10-CM

## 2020-09-15 MED ORDER — ACCU-CHEK GUIDE VI STRP: ORAL_STRIP | 12 refills | Status: AC

## 2020-09-15 MED ORDER — ACCU-CHEK SOFTCLIX LANCETS MISC: 12 refills | Status: AC

## 2020-09-15 NOTE — Patient Instructions (Signed)
Diabetes Mellitus and Nutrition, Adult When you have diabetes, or diabetes mellitus, it is very important to have healthy eating habits because your blood sugar (glucose) levels are greatly affected by what you eat and drink. Eating healthy foods in the right amounts, at about the same times every day, can help you:  Control your blood glucose.  Lower your risk of heart disease.  Improve your blood pressure.  Reach or maintain a healthy weight. What can affect my meal plan? Every person with diabetes is different, and each person has different needs for a meal plan. Your health care provider may recommend that you work with a dietitian to make a meal plan that is best for you. Your meal plan may vary depending on factors such as:  The calories you need.  The medicines you take.  Your weight.  Your blood glucose, blood pressure, and cholesterol levels.  Your activity level.  Other health conditions you have, such as heart or kidney disease. How do carbohydrates affect me? Carbohydrates, also called carbs, affect your blood glucose level more than any other type of food. Eating carbs naturally raises the amount of glucose in your blood. Carb counting is a method for keeping track of how many carbs you eat. Counting carbs is important to keep your blood glucose at a healthy level, especially if you use insulin or take certain oral diabetes medicines. It is important to know how many carbs you can safely have in each meal. This is different for every person. Your dietitian can help you calculate how many carbs you should have at each meal and for each snack. How does alcohol affect me? Alcohol can cause a sudden decrease in blood glucose (hypoglycemia), especially if you use insulin or take certain oral diabetes medicines. Hypoglycemia can be a life-threatening condition. Symptoms of hypoglycemia, such as sleepiness, dizziness, and confusion, are similar to symptoms of having too much  alcohol.  Do not drink alcohol if: ? Your health care provider tells you not to drink. ? You are pregnant, may be pregnant, or are planning to become pregnant.  If you drink alcohol: ? Do not drink on an empty stomach. ? Limit how much you use to:  0-1 drink a day for women.  0-2 drinks a day for men. ? Be aware of how much alcohol is in your drink. In the [place] one drink equals one 12 oz bottle of beer (355 mL), one 5 oz glass of wine (148 mL), or one 1 oz glass of hard liquor (44 mL). ? Keep yourself hydrated with water, diet soda, or unsweetened iced tea.  Keep in mind that regular soda, juice, and other mixers may contain a lot of sugar and must be counted as carbs. What are tips for following this plan? Reading food labels  Start by checking the serving size on the "Nutrition Facts" label of packaged foods and drinks. The amount of calories, carbs, fats, and other nutrients listed on the label is based on one serving of the item. Many items contain more than one serving per package.  Check the total grams (g) of carbs in one serving. You can calculate the number of servings of carbs in one serving by dividing the total carbs by 15. For example, if a food has 30 g of total carbs per serving, it would be equal to 2 servings of carbs.  Check the number of grams (g) of saturated fats and trans fats in one serving. Choose foods that have   a low amount or none of these fats.  Check the number of milligrams (mg) of salt (sodium) in one serving. Most people should limit total sodium intake to less than 2,300 mg per day.  Always check the nutrition information of foods labeled as "low-fat" or "nonfat." These foods may be higher in added sugar or refined carbs and should be avoided.  Talk to your dietitian to identify your daily goals for nutrients listed on the label. Shopping  Avoid buying canned, pre-made, or processed foods. These foods tend to be high in fat, sodium, and added  sugar.  Shop around the outside edge of the grocery store. This is where you will most often find fresh fruits and vegetables, bulk grains, fresh meats, and fresh dairy. Cooking  Use low-heat cooking methods, such as baking, instead of high-heat cooking methods like deep frying.  Cook using healthy oils, such as olive, canola, or sunflower oil.  Avoid cooking with butter, cream, or high-fat meats. Meal planning  Eat meals and snacks regularly, preferably at the same times every day. Avoid going long periods of time without eating.  Eat foods that are high in fiber, such as fresh fruits, vegetables, beans, and whole grains. Talk with your dietitian about how many servings of carbs you can eat at each meal.  Eat 4-6 oz (112-168 g) of lean protein each day, such as lean meat, chicken, fish, eggs, or tofu. One ounce (oz) of lean protein is equal to: ? 1 oz (28 g) of meat, chicken, or fish. ? 1 egg. ?  cup (62 g) of tofu.  Eat some foods each day that contain healthy fats, such as avocado, nuts, seeds, and fish.   What foods should I eat? Fruits Berries. Apples. Oranges. Peaches. Apricots. Plums. Grapes. Mango. Papaya. Pomegranate. Kiwi. Cherries. Vegetables Lettuce. Spinach. Leafy greens, including kale, chard, collard greens, and mustard greens. Beets. Cauliflower. Cabbage. Broccoli. Carrots. Green beans. Tomatoes. Peppers. Onions. Cucumbers. Brussels sprouts. Grains Whole grains, such as whole-wheat or whole-grain bread, crackers, tortillas, cereal, and pasta. Unsweetened oatmeal. Quinoa. Brown or wild rice. Meats and other proteins Seafood. Poultry without skin. Lean cuts of poultry and beef. Tofu. Nuts. Seeds. Dairy Low-fat or fat-free dairy products such as milk, yogurt, and cheese. The items listed above may not be a complete list of foods and beverages you can eat. Contact a dietitian for more information. What foods should I avoid? Fruits Fruits canned with  syrup. Vegetables Canned vegetables. Frozen vegetables with butter or cream sauce. Grains Refined white flour and flour products such as bread, pasta, snack foods, and cereals. Avoid all processed foods. Meats and other proteins Fatty cuts of meat. Poultry with skin. Breaded or fried meats. Processed meat. Avoid saturated fats. Dairy Full-fat yogurt, cheese, or milk. Beverages Sweetened drinks, such as soda or iced tea. The items listed above may not be a complete list of foods and beverages you should avoid. Contact a dietitian for more information. Questions to ask a health care provider  Do I need to meet with a diabetes educator?  Do I need to meet with a dietitian?  What number can I call if I have questions?  When are the best times to check my blood glucose? Where to find more information:  American Diabetes Association: diabetes.org  Academy of Nutrition and Dietetics: www.eatright.org  National Institute of Diabetes and Digestive and Kidney Diseases: www.niddk.nih.gov  Association of Diabetes Care and Education Specialists: www.diabeteseducator.org Summary  It is important to have healthy eating   habits because your blood sugar (glucose) levels are greatly affected by what you eat and drink.  A healthy meal plan will help you control your blood glucose and maintain a healthy lifestyle.  Your health care provider may recommend that you work with a dietitian to make a meal plan that is best for you.  Keep in mind that carbohydrates (carbs) and alcohol have immediate effects on your blood glucose levels. It is important to count carbs and to use alcohol carefully. This information is not intended to replace advice given to you by your health care provider. Make sure you discuss any questions you have with your health care provider. Document Revised: 03/10/2019 Document Reviewed: 03/10/2019 Elsevier Patient Education  2021 [employer]  

## 2020-09-15 NOTE — Progress Notes (Signed)
Endocrinology Consult Note       09/15/2020, 3:59 PM   Subjective:    Patient ID: Sonia Morales, female    DOB: Sep 25, 1955.  Sonia Morales is being seen in consultation for management of currently uncontrolled symptomatic diabetes requested by  Sonia Chimera, MD.   Past Medical History:  Diagnosis Date  . Acute anterior wall MI (HCC) 10/04/2015  . Acute MI anterior wall first episode care [facility]) 10/04/2015   Coronary angiogram 10/04/2015: Occluded proximal LAD, superior takeoff of ramus intermediate, large circumflex, moderate to large RCA with mild diffuse disease. S/P thrombectomy, overlapping stents to the proximal LAD with 3.5 x 18, 3.5 x 12 mm Xience DES, 100% to 0%.   Marland Kitchen CAD (coronary artery disease), native coronary artery 10/11/2015   Coronary angiogram 10/04/2015: Left main mild calcification. LAD tortuous origin, occluded in the proximal segment. Diagonal 2 is moderate-sized, has proximal 50% stenosis. S/P thrombectomy & DES overlapping 3.5 x 18 and 3.5 x 12 mm Xience Alpine stents,   Ramus intermediate has a tortuous origin. Circumflex mild disease. Right coronary artery mild diffuse disease  . Colitis   . DM (diabetes mellitus), type 2, uncontrolled with complications (HCC) 06/12/2018  . H/O tobacco use, presenting hazards to health 06/12/2018   Quit in 2017 after MI  . Hypercholesteremia 06/12/2018    Past Surgical History:  Procedure Laterality Date  . CARDIAC CATHETERIZATION N/A 10/04/2015   Procedure: Left Heart Cath and Coronary Angiography;  Surgeon: Yates Decamp, MD;  Location: [facility] INVASIVE CV LAB;  Service: Cardiovascular;  Laterality: N/A;  . CARDIAC CATHETERIZATION  10/04/2015   Procedure: Coronary Stent Intervention;  Surgeon: Yates Decamp, MD;  Location: [facility] INVASIVE CV LAB;  Service: Cardiovascular;;  . PERIPHERAL VASCULAR CATHETERIZATION  10/04/2015   Procedure: Thrombectomy;  Surgeon: Yates Decamp, MD;   Location: [facility] INVASIVE CV LAB;  Service: Cardiovascular;;    Social History   Socioeconomic History  . Marital status: Married    Spouse name: Not on file  . Number of children: 2  . Years of education: Not on file  . Highest education level: Not on file  Occupational History  . Not on file  Tobacco Use  . Smoking status: Former Smoker    Packs/day: 1.00    Years: 30.00    Pack years: 30.00    Types: Cigarettes    Quit date: 10/04/2018    Years since quitting: 1.9  . Smokeless tobacco: Never Used  Vaping Use  . Vaping Use: Never used  Substance and Sexual Activity  . Alcohol use: Never  . Drug use: Never  . Sexual activity: Not on file  Other Topics Concern  . Not on file  Social History Narrative  . Not on file   Social Determinants of Health   Financial Resource Strain: Not on file  Food Insecurity: Not on file  Transportation Needs: Not on file  Physical Activity: Not on file  Stress: Not on file  Social Connections: Not on file    Family History  Problem Relation Age of Onset  . Heart attack Mother   . Cancer Brother     Outpatient Encounter Medications as of 09/15/2020  Medication Sig  . Accu-Chek Softclix Lancets lancets Use as instructed to monitor glucose 4 times daily  . aspirin 81 MG tablet Take 81 mg by mouth daily.  Marland Kitchen. atorvastatin (LIPITOR) 80 MG tablet Take 1 tablet (80 mg total) by mouth daily at 6 PM. (Patient taking differently: Take 80 mg by mouth at bedtime.)  . carvedilol (COREG) 6.25 MG tablet Take 1 tablet (6.25 mg total) by mouth 2 (two) times daily.  Marland Kitchen. FLUoxetine (PROZAC) 40 MG capsule Take 40 mg by mouth daily.  Marland Kitchen. gabapentin (NEURONTIN) 300 MG capsule Take 300 mg by mouth daily.  Marland Kitchen. glucose blood (ACCU-CHEK GUIDE) test strip Use as instructed to monitor glucose 4 times daily  . insulin aspart protamine- aspart (NOVOLOG MIX 70/30) (70-30) 100 UNIT/ML injection Inject 43 Units into the skin 2 (two) times daily.   . Multiple Vitamin  (MULTIVITAMIN) tablet Take 1 tablet by mouth daily.  . Oxycodone HCl 10 MG TABS Take 10 mg by mouth 3 (three) times daily as needed (Pain).  . Prasterone, DHEA, (DHEA 50 PO) Take by mouth daily.  . sacubitril-valsartan (ENTRESTO) 97-103 MG Take 1 tablet by mouth 2 (two) times daily.  . traZODone (DESYREL) 50 MG tablet Take 50 mg by mouth at bedtime.   . nitroGLYCERIN (NITROSTAT) 0.4 MG SL tablet Place 1 tablet (0.4 mg total) under the tongue every 5 (five) minutes as needed for up to 25 days for chest pain.   No facility-administered encounter medications on file as of 09/15/2020.    ALLERGIES: No Known Allergies  VACCINATION STATUS:  There is no immunization history on file for this patient.  Diabetes She presents for her initial diabetic visit. She has type 2 diabetes mellitus. Onset time: was diagnosed at approx age of 65. Her disease course has been worsening. Hypoglycemia symptoms include nervousness/anxiousness, sweats and tremors. Associated symptoms include fatigue, polydipsia and polyuria. Pertinent negatives for diabetes include no blurred vision and no weight loss. There are no hypoglycemic complications. Symptoms are stable. Diabetic complications include heart disease, nephropathy and peripheral neuropathy. (Has had MI in past) Risk factors for coronary artery disease include diabetes mellitus, dyslipidemia, family history, hypertension, obesity, post-menopausal, sedentary lifestyle and tobacco exposure. Current diabetic treatment includes insulin injections (was prescribed Farxiga but never picked it up). She is compliant with treatment most of the time. Her weight is fluctuating minimally. She is following a generally unhealthy diet. When asked about meal planning, she reported none. She has not had a previous visit with a dietitian. She rarely participates in exercise. (She presents today for her consultation with no meter or logs to review.  Her most recent A1c was 11.1% on 08/31/20.   She only monitors glucose every other day.  She admits to drinking diet soda in addition to her diet tea and water and frequently skips lunch on most days.  She does occasionally snack.  She does not engage in routine physical activity as she has problems with her back.  She reports some episodes of hypoglycemia (dropping into the 60s) at times, likely due to meal timing or quantity.) An ACE inhibitor/angiotensin II receptor blocker is being taken. She does not see a podiatrist.Eye exam is current.  Hypertension This is a chronic problem. The current episode started more than 1 year ago. The problem has been resolved since onset. The problem is controlled. Associated symptoms include sweats. Pertinent negatives include no blurred vision. There are no associated agents to hypertension. Risk factors for coronary artery disease include diabetes  mellitus, dyslipidemia, family history, obesity, post-menopausal state, sedentary lifestyle and smoking/tobacco exposure. Past treatments include beta blockers and angiotensin blockers. The current treatment provides significant improvement. There are no compliance problems.  Hypertensive end-organ damage includes kidney disease and CAD/MI. Identifiable causes of hypertension include chronic renal disease.  Hyperlipidemia This is a chronic problem. The current episode started more than 1 year ago. The problem is controlled. Recent lipid tests were reviewed and are normal. Exacerbating diseases include chronic renal disease and obesity. Factors aggravating her hyperlipidemia include beta blockers and smoking. Current antihyperlipidemic treatment includes statins. The current treatment provides significant improvement of lipids. Compliance problems include adherence to exercise and adherence to diet.  Risk factors for coronary artery disease include diabetes mellitus, dyslipidemia, family history, hypertension, obesity, post-menopausal and a sedentary lifestyle.     Review  of systems  Constitutional: + Minimally fluctuating body weight, current Body mass index is 33.66 kg/m., + fatigue, no subjective hyperthermia, no subjective hypothermia Eyes: no blurry vision, no xerophthalmia ENT: no sore throat, no nodules palpated in throat, no dysphagia/odynophagia, no hoarseness Cardiovascular: no chest pain, no shortness of breath, no palpitations, no leg swelling Respiratory: no cough, no shortness of breath Gastrointestinal: no nausea/vomiting/diarrhea Musculoskeletal: + c/o back pain (recently underwent testing with her PCP to identify cause) Skin: no rashes, no hyperemia Neurological: no tremors, no numbness, no tingling, no dizziness Psychiatric: no depression, no anxiety  Objective:     BP 118/68   Pulse 77   Ht 5\' 3"  (1.6 m)   Wt 190 lb (86.2 kg)   BMI 33.66 kg/m   Wt Readings from Last 3 Encounters:  09/15/20 190 lb (86.2 kg)  09/06/20 191 lb 9.6 oz (86.9 kg)  02/25/20 182 lb (82.6 kg)     BP Readings from Last 3 Encounters:  09/15/20 118/68  09/06/20 (!) 96/58  02/25/20 (!) 120/59     Physical Exam- Limited  Constitutional:  Body mass index is 33.66 kg/m. , not in acute distress, normal state of mind Eyes:  EOMI, no exophthalmos Neck: Supple Cardiovascular: RRR, no murmurs, rubs, or gallops, no edema Respiratory: Adequate breathing efforts, no crackles, rales, rhonchi, or wheezing Musculoskeletal: no gross deformities, strength intact in all four extremities, no gross restriction of joint movements Skin:  no rashes, no hyperemia Neurological: no tremor with outstretched hands    CMP ( most recent) CMP     Component Value Date/Time   NA 137 01/13/2019 1349   K 4.8 01/13/2019 1349   CL 100 01/13/2019 1349   CO2 22 01/13/2019 1349   GLUCOSE 295 (H) 01/13/2019 1349   GLUCOSE 259 (H) 10/05/2015 0410   BUN 24 (A) 08/31/2020 0000   CREATININE 1.3 (A) 08/31/2020 0000   CREATININE 1.12 (H) 01/13/2019 1349   CALCIUM 9.2 01/13/2019  1349   PROT 6.7 01/13/2019 1349   ALBUMIN 4.1 01/13/2019 1349   AST 14 01/13/2019 1349   ALT 12 01/13/2019 1349   ALKPHOS 107 01/13/2019 1349   BILITOT <0.2 01/13/2019 1349   GFRNONAA 48 08/31/2020 0000   GFRAA 61 01/13/2019 1349     Diabetic Labs (most recent): Lab Results  Component Value Date   HGBA1C 11.1 08/31/2020   HGBA1C 10.6 (H) 10/04/2015     Lipid Panel ( most recent) Lipid Panel     Component Value Date/Time   CHOL 121 01/13/2019 1349   TRIG 83 08/31/2020 0000   HDL 46 01/13/2019 1349   CHOLHDL 3.0 10/04/2015 1814   VLDL 13  10/04/2015 1814   LDLCALC 37 08/31/2020 0000   LDLCALC 59 01/13/2019 1349   LDLDIRECT 57 01/13/2019 1349   LABVLDL 16 01/13/2019 1349      Lab Results  Component Value Date   TSH 2.100 01/13/2019   TSH 0.613 10/04/2015           Assessment & Plan:   1) 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  Her most recent A1c was 11.1% on 08/31/20.  She only monitors glucose every other day.  She admits to drinking diet soda in addition to her diet tea and water and frequently skips lunch on most days.  She does occasionally snack.  She does not engage in routine physical activity as she has problems with her back.  She reports some episodes of hypoglycemia (dropping into the 60s) at times, likely due to meal timing or quantity.  - MARYUM SLAVEY has currently uncontrolled symptomatic type 2 DM since 65 years of age, with most recent A1c of 11.1 %.   -Recent labs reviewed.  - I had a long discussion with her about the progressive nature of diabetes and the pathology behind its complications. 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 These are all discussed in detail with her.  - I have counseled her on diet  and weight management by adopting a carbohydrate restricted/protein rich diet. Patient is encouraged to switch to unprocessed or minimally processed complex starch and increased protein intake (animal or plant source), fruits, and vegetables. -  she is advised to stick to a routine mealtimes to eat 3 meals a day and avoid unnecessary snacks (to snack only to correct hypoglycemia).   - she acknowledges that there is a room for improvement in her food and drink choices. - Suggestion is made for her to avoid simple carbohydrates from her diet including Cakes, Sweet Desserts, Ice Cream, Soda (diet and regular), Sweet Tea, Candies, Chips, Cookies, Store Bought Juices, Alcohol in Excess of 1-2 drinks a day, Artificial Sweeteners, Coffee Creamer, and "Sugar-free" Products. This will help patient to have more stable blood glucose profile and potentially avoid unintended weight gain.  - she will be scheduled with Norm Salt, RDN, CDE for diabetes education.  - I have approached her with the following individualized plan to manage her diabetes and patient agrees:   -She is advised to continue her current regimen of Novolog 70/30 at 50 units with breakfast and 50 units with supper if glucose is above 90 and she is eating.  -she is encouraged to start monitoring glucose 4 times daily, before meals and before bed, to log their readings on the clinic sheets provided, and bring them to review at follow up appointment in 2 weeks.  - she is warned not to take insulin without proper monitoring per orders. - Adjustment parameters are given to her for hypo and hyperglycemia in writing. - she is encouraged to call clinic for blood glucose levels less than 70 or above 300 mg /dl.  - she is not a candidate for Metformin due to concurrent renal insufficiency and GI upset.  - she will be considered for incretin therapy as appropriate next visit.  - Specific targets for  A1c; LDL, HDL, and Triglycerides were  discussed with the patient.  2) Blood Pressure /Hypertension:  her blood pressure is controlled to target.   she is advised to continue her current medications including Coreg 6.25 mg  mg p.o. twice daily, and Entresto 97-103 mg po twice daily.  3) Lipids/Hyperlipidemia:    Review of her recent lipid panel from 08/31/20 showed controlled LDL at 37 .  she is advised to continue Lipitor 80 mg daily at bedtime.  Side effects and precautions discussed with her.  4)  Weight/Diet:  her Body mass index is 33.66 kg/m.  -  clearly complicating her diabetes care.   she is a candidate for weight loss. I discussed with her the fact that loss of 5 - 10% of her  current body weight will have the most impact on her diabetes management.  Exercise, and detailed carbohydrates information provided  -  detailed on discharge instructions.  5) Chronic Care/Health Maintenance: -she is on ACEI/ARB and Statin medications and is encouraged to initiate and continue to follow up with Ophthalmology, Dentist, Podiatrist at least yearly or according to recommendations, and advised to stay away from smoking. I have recommended yearly flu vaccine and pneumonia vaccine at least every 5 years; moderate intensity exercise for up to 150 minutes weekly; and sleep for at least 7 hours a day.  - she is advised to maintain close follow up with Sonia Chimera, MD for primary care needs, as well as her other providers for optimal and coordinated care.   - Time spent in this patient care: 60 min, of which > 50% was spent in counseling her about her diabetes and the rest reviewing her blood glucose logs, discussing her hypoglycemia and hyperglycemia episodes, reviewing her current and previous labs/studies (including abstraction from other facilities) and medications doses and developing a long term treatment plan based on the latest standards of care/guidelines; and documenting her care.    Please refer to Patient Instructions for Blood  Glucose Monitoring and Insulin/Medications Dosing Guide" in media tab for additional information. Please also refer to "Patient Self Inventory" in the Media tab for reviewed elements of pertinent patient history.  Cyndie Mull participated in the discussions, expressed understanding, and voiced agreement with the above plans.  All questions were answered to her satisfaction. she is encouraged to contact clinic should she have any questions or concerns prior to her return visit.     Follow up plan: - Return in about 2 weeks (around 09/29/2020) for Diabetes F/U, Bring meter and logs.    Ronny Bacon, [facility] [address] [place], [place] [address] Phone: [phone] Fax: [phone]  09/15/2020, 3:59 PM

## 2020-10-04 ENCOUNTER — Ambulatory Visit: Payer: Medicare Other | Admitting: Nurse Practitioner

## 2020-10-04 DIAGNOSIS — Z6834 Body mass index (BMI) 34.0-34.9, adult: Secondary | ICD-10-CM | POA: Diagnosis not present

## 2020-10-04 DIAGNOSIS — M25511 Pain in right shoulder: Secondary | ICD-10-CM | POA: Diagnosis not present

## 2020-10-04 DIAGNOSIS — M7500 Adhesive capsulitis of unspecified shoulder: Secondary | ICD-10-CM | POA: Diagnosis not present

## 2020-10-04 DIAGNOSIS — F17210 Nicotine dependence, cigarettes, uncomplicated: Secondary | ICD-10-CM | POA: Diagnosis not present

## 2020-10-04 DIAGNOSIS — M19011 Primary osteoarthritis, right shoulder: Secondary | ICD-10-CM | POA: Diagnosis not present

## 2020-10-10 DIAGNOSIS — M47812 Spondylosis without myelopathy or radiculopathy, cervical region: Secondary | ICD-10-CM | POA: Diagnosis not present

## 2020-10-10 DIAGNOSIS — Z79891 Long term (current) use of opiate analgesic: Secondary | ICD-10-CM | POA: Diagnosis not present

## 2020-10-10 DIAGNOSIS — G894 Chronic pain syndrome: Secondary | ICD-10-CM | POA: Diagnosis not present

## 2020-10-10 DIAGNOSIS — M47816 Spondylosis without myelopathy or radiculopathy, lumbar region: Secondary | ICD-10-CM | POA: Diagnosis not present

## 2020-10-10 DIAGNOSIS — G4700 Insomnia, unspecified: Secondary | ICD-10-CM | POA: Diagnosis not present

## 2020-10-18 ENCOUNTER — Ambulatory Visit: Payer: Medicare Other | Admitting: Nurse Practitioner

## 2020-10-19 ENCOUNTER — Ambulatory Visit (INDEPENDENT_AMBULATORY_CARE_PROVIDER_SITE_OTHER): Payer: Medicare Other | Admitting: Nurse Practitioner

## 2020-10-19 ENCOUNTER — Encounter: Payer: Self-pay | Admitting: Nurse Practitioner

## 2020-10-19 VITALS — BP 102/66 | HR 82 | Ht 63.0 in | Wt 191.0 lb

## 2020-10-19 DIAGNOSIS — E782 Mixed hyperlipidemia: Secondary | ICD-10-CM

## 2020-10-19 DIAGNOSIS — N1831 Chronic kidney disease, stage 3a: Secondary | ICD-10-CM | POA: Diagnosis not present

## 2020-10-19 DIAGNOSIS — E1122 Type 2 diabetes mellitus with diabetic chronic kidney disease: Secondary | ICD-10-CM | POA: Diagnosis not present

## 2020-10-19 DIAGNOSIS — Z794 Long term (current) use of insulin: Secondary | ICD-10-CM | POA: Diagnosis not present

## 2020-10-19 DIAGNOSIS — I10 Essential (primary) hypertension: Secondary | ICD-10-CM

## 2020-10-19 NOTE — Patient Instructions (Signed)

## 2020-10-19 NOTE — Progress Notes (Signed)
Endocrinology Follow Up Note       10/19/2020, 3:38 PM   Subjective:    Patient ID: Sonia Morales, female    DOB: 04/20/1955.  Sonia Morales is being seen in follow up after being seen in consultation for management of currently uncontrolled symptomatic diabetes requested by  Angiulli, Day G, MD.   Past Medical History:  Diagnosis Date   Acute anterior wall MI (HCC) 10/04/2015   Acute MI anterior wall first episode care [facility](HCC) 10/04/2015   Coronary angiogram 10/04/2015: Occluded proximal LAD, superior takeoff of ramus intermediate, large circumflex, moderate to large RCA with mild diffuse disease. S/P thrombectomy, overlapping stents to the proximal LAD with 3.5 x 18, 3.5 x 12 mm Xience DES, 100% to 0%.    CAD (coronary artery disease), native coronary artery 10/11/2015   Coronary angiogram 10/04/2015: Left main mild calcification. LAD tortuous origin, occluded in the proximal segment. Diagonal 2 is moderate-sized, has proximal 50% stenosis. S/P thrombectomy & DES overlapping 3.5 x 18 and 3.5 x 12 mm Xience Alpine stents,   Ramus intermediate has a tortuous origin. Circumflex mild disease. Right coronary artery mild diffuse disease   Colitis    DM (diabetes mellitus), type 2, uncontrolled with complications (HCC) 06/12/2018   H/O tobacco use, presenting hazards to health 06/12/2018   Quit in 2017 after MI   Hypercholesteremia 06/12/2018    Past Surgical History:  Procedure Laterality Date   CARDIAC CATHETERIZATION N/A 10/04/2015   Procedure: Left Heart Cath and Coronary Angiography;  Surgeon: Yates DecampJay Ganji, MD;  [facility];  Service: Cardiovascular;  Laterality: N/A;   CARDIAC CATHETERIZATION  10/04/2015   Procedure: Coronary Stent Intervention;  Surgeon: Yates DecampJay Ganji, MD;  [facility];  Service: Cardiovascular;;   PERIPHERAL VASCULAR CATHETERIZATION  10/04/2015   Procedure: Thrombectomy;  Surgeon:  Yates DecampJay Ganji, MD;  [facility];  Service: Cardiovascular;;    Social History   Socioeconomic History   Marital status: Married    Spouse name: Not on file   Number of children: 2   Years of education: Not on file   Highest education level: Not on file  Occupational History   Not on file  Tobacco Use   Smoking status: Former    Packs/day: 1.00    Years: 30.00    Pack years: 30.00    Types: Cigarettes    Quit date: 10/04/2018    Years since quitting: 2.0   Smokeless tobacco: Never  Vaping Use   Vaping Use: Never used  Substance and Sexual Activity   Alcohol use: Never   Drug use: Never   Sexual activity: Not on file  Other Topics Concern   Not on file  Social History Narrative   Not on file   Social Determinants of Health   Financial Resource Strain: Not on file  Food Insecurity: Not on file  Transportation Needs: Not on file  Physical Activity: Not on file  Stress: Not on file  Social Connections: Not on file    Family History  Problem Relation Age of Onset   Heart attack Mother    Cancer Brother     Outpatient  Encounter Medications as of 10/19/2020  Medication Sig   Accu-Chek Softclix Lancets lancets Use as instructed to monitor glucose 4 times daily   aspirin 81 MG tablet Take 81 mg by mouth daily.   atorvastatin (LIPITOR) 80 MG tablet Take 1 tablet (80 mg total) by mouth daily at 6 PM. (Patient taking differently: Take 80 mg by mouth at bedtime.)   carvedilol (COREG) 6.25 MG tablet Take 1 tablet (6.25 mg total) by mouth 2 (two) times daily.   FLUoxetine (PROZAC) 40 MG capsule Take 40 mg by mouth daily.   gabapentin (NEURONTIN) 300 MG capsule Take 300 mg by mouth daily.   glucose blood (ACCU-CHEK GUIDE) test strip Use as instructed to monitor glucose 4 times daily   insulin aspart protamine- aspart (NOVOLOG MIX 70/30) (70-30) 100 UNIT/ML injection Inject 50-60 Units into the skin 2 (two) times daily. Inject 50 units with breakfast and 60 units  with supper if glucose is above 90 and she is eating   Multiple Vitamin (MULTIVITAMIN) tablet Take 1 tablet by mouth daily.   nitroGLYCERIN (NITROSTAT) 0.4 MG SL tablet Place 1 tablet (0.4 mg total) under the tongue every 5 (five) minutes as needed for up to 25 days for chest pain.   Oxycodone HCl 10 MG TABS Take 10 mg by mouth 3 (three) times daily as needed (Pain).   Prasterone, DHEA, (DHEA 50 PO) Take by mouth daily.   sacubitril-valsartan (ENTRESTO) 97-103 MG Take 1 tablet by mouth 2 (two) times daily.   traZODone (DESYREL) 50 MG tablet Take 50 mg by mouth at bedtime.    No facility-administered encounter medications on file as of 10/19/2020.    ALLERGIES: No Known Allergies  VACCINATION STATUS:  There is no immunization history on file for this patient.  Diabetes She presents for her follow-up diabetic visit. She has type 2 diabetes mellitus. Onset time: was diagnosed at approx age of 89. Her disease course has been fluctuating. Hypoglycemia symptoms include nervousness/anxiousness, sweats and tremors. Associated symptoms include fatigue, polydipsia and polyuria. Pertinent negatives for diabetes include no blurred vision and no weight loss. There are no hypoglycemic complications. Symptoms are stable. Diabetic complications include heart disease, nephropathy and peripheral neuropathy. (Has had MI in past) Risk factors for coronary artery disease include diabetes mellitus, dyslipidemia, family history, hypertension, obesity, post-menopausal, sedentary lifestyle and tobacco exposure. Current diabetic treatment includes insulin injections (was prescribed Farxiga but never picked it up). She is compliant with treatment most of the time. Her weight is fluctuating minimally. She is following a generally unhealthy diet. When asked about meal planning, she reported none. She has not had a previous visit with a dietitian. She rarely participates in exercise. Her home blood glucose trend is fluctuating  dramatically. (She presents today with her meter and logs showing widely fluctuating glycemic profile, primarily hyperglycemia but with some significant random daytime lows.  She fell a few weeks ago and tore her rotator cuff requiring steroid injection and plans for surgery coming soon.  Analysis of her meter shows 7-day average of 263, 14-day average of 252, 30-day average of 217.  ) An ACE inhibitor/angiotensin II receptor blocker is being taken. She does not see a podiatrist.Eye exam is current.  Hypertension This is a chronic problem. The current episode started more than 1 year ago. The problem has been resolved since onset. The problem is controlled. Associated symptoms include sweats. Pertinent negatives include no blurred vision. There are no associated agents to hypertension. Risk factors for coronary artery disease  include diabetes mellitus, dyslipidemia, family history, obesity, post-menopausal state, sedentary lifestyle and smoking/tobacco exposure. Past treatments include beta blockers and angiotensin blockers. The current treatment provides significant improvement. There are no compliance problems.  Hypertensive end-organ damage includes kidney disease and CAD/MI. Identifiable causes of hypertension include chronic renal disease.  Hyperlipidemia This is a chronic problem. The current episode started more than 1 year ago. The problem is controlled. Recent lipid tests were reviewed and are normal. Exacerbating diseases include chronic renal disease and obesity. Factors aggravating her hyperlipidemia include beta blockers and smoking. Current antihyperlipidemic treatment includes statins. The current treatment provides significant improvement of lipids. Compliance problems include adherence to exercise and adherence to diet.  Risk factors for coronary artery disease include diabetes mellitus, dyslipidemia, family history, hypertension, obesity, post-menopausal and a sedentary lifestyle.    Review  of systems  Constitutional: + Minimally fluctuating body weight, current Body mass index is 33.83 kg/m., + fatigue, no subjective hyperthermia, no subjective hypothermia Eyes: no blurry vision, no xerophthalmia ENT: no sore throat, no nodules palpated in throat, no dysphagia/odynophagia, no hoarseness Cardiovascular: no chest pain, no shortness of breath, no palpitations, no leg swelling Respiratory: no cough, no shortness of breath Gastrointestinal: no nausea/vomiting/diarrhea Musculoskeletal: + c/o back pain, right shoulder pain- recently tore her rotator cuff- will require surgery Skin: no rashes, no hyperemia Neurological: no tremors, no numbness, no tingling, no dizziness Psychiatric: no depression, no anxiety  Objective:     BP 102/66   Pulse 82   Ht 5\' 3"  (1.6 m)   Wt 191 lb (86.6 kg)   BMI 33.83 kg/m   Wt Readings from Last 3 Encounters:  10/19/20 191 lb (86.6 kg)  09/15/20 190 lb (86.2 kg)  09/06/20 191 lb 9.6 oz (86.9 kg)     BP Readings from Last 3 Encounters:  10/19/20 102/66  09/15/20 118/68  09/06/20 (!) 96/58      Physical Exam- Limited  Constitutional:  Body mass index is 33.83 kg/m. , not in acute distress, normal state of mind Eyes:  EOMI, no exophthalmos Neck: Supple Cardiovascular: RRR, no murmurs, rubs, or gallops, no edema Respiratory: Adequate breathing efforts, no crackles, rales, rhonchi, or wheezing Musculoskeletal: no gross deformities, strength intact in all four extremities, no gross restriction of joint movements Skin:  no rashes, no hyperemia Neurological: no tremor with outstretched hands    CMP ( most recent) CMP     Component Value Date/Time   NA 137 01/13/2019 1349   K 4.8 01/13/2019 1349   CL 100 01/13/2019 1349   CO2 22 01/13/2019 1349   GLUCOSE 295 (H) 01/13/2019 1349   GLUCOSE 259 (H) 10/05/2015 0410   BUN 24 (A) 08/31/2020 0000   CREATININE 1.3 (A) 08/31/2020 0000   CREATININE 1.12 (H) 01/13/2019 1349   CALCIUM 9.2  01/13/2019 1349   PROT 6.7 01/13/2019 1349   ALBUMIN 4.1 01/13/2019 1349   AST 14 01/13/2019 1349   ALT 12 01/13/2019 1349   ALKPHOS 107 01/13/2019 1349   BILITOT <0.2 01/13/2019 1349   GFRNONAA 48 08/31/2020 0000   GFRAA 61 01/13/2019 1349     Diabetic Labs (most recent): Lab Results  Component Value Date   HGBA1C 11.1 08/31/2020   HGBA1C 10.6 (H) 10/04/2015     Lipid Panel ( most recent) Lipid Panel     Component Value Date/Time   CHOL 121 01/13/2019 1349   TRIG 83 08/31/2020 0000   HDL 46 01/13/2019 1349   CHOLHDL 3.0 10/04/2015 1814  VLDL 13 10/04/2015 1814   LDLCALC 37 08/31/2020 0000   LDLCALC 59 01/13/2019 1349   LDLDIRECT 57 01/13/2019 1349   LABVLDL 16 01/13/2019 1349      Lab Results  Component Value Date   TSH 2.100 01/13/2019   TSH 0.613 10/04/2015           Assessment & Plan:   1) 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  She fell a few weeks ago and tore her rotator cuff requiring steroid injection and plans for surgery coming soon.  Analysis of her meter shows 7-day average of 263, 14-day average of 252, 30-day average of 217.    - Sonia Morales has currently uncontrolled symptomatic type 2 DM since 65 years of age, with most recent A1c of 11.1 %.   -Recent labs reviewed.  - I had a long discussion with her about the progressive nature of diabetes and the pathology behind its complications. 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 These are all discussed in detail with her.  - Nutritional counseling repeated at each appointment due to patients tendency to fall back in to old habits.  - The patient admits there is a room  for improvement in their diet and drink choices. -  Suggestion is made for the patient to avoid simple carbohydrates from their diet including Cakes, Sweet Desserts / Pastries, Ice Cream, Soda (diet and regular), Sweet Tea, Candies, Chips, Cookies, Sweet Pastries, Store Bought Juices, Alcohol in Excess of 1-2 drinks a day, Artificial Sweeteners, Coffee Creamer, and "Sugar-free" Products. This will help patient to have stable blood glucose profile and potentially avoid unintended weight gain.   - I encouraged the patient to switch to unprocessed or minimally processed complex starch and increased protein intake (animal or plant source), fruits, and vegetables.   - Patient is advised to stick to a routine mealtimes to eat 3 meals a day and avoid unnecessary snacks (to snack only to correct hypoglycemia).  - she will be scheduled with Norm Salt, RDN, CDE for diabetes education.  - I have approached her with the following individualized plan to manage her diabetes and patient agrees:   -She is advised to adjust her 70/30 to 50 units with breakfast and 60 units with supper if glucose is above 90 and she is eating.  Regarding her random low glucose readings, we discussed the possibility that the insulin had not been mixed enough before injecting causing the ratio of long acting to short acting insulin to be thrown off some.  We discussed the proper preparation of this medication today.    -she is encouraged to continue monitoring glucose 3 times daily, before injecting insulin at breakfast and supper, and at bedtime.  She is advised to call the clinic if she has readings less than 70 or greater than 300 for 3 tests in a row.   - she is warned not to take insulin without proper monitoring per orders. - Adjustment parameters are given to her for hypo and hyperglycemia in writing.  - she is not a candidate for Metformin due to concurrent renal insufficiency and GI upset.  - she will be considered for  incretin therapy as appropriate next visit.  -  Specific targets for  A1c; LDL, HDL, and Triglycerides were discussed with the patient.  2) Blood Pressure /Hypertension:  her blood pressure is controlled to target.   she is advised to continue her current medications including Coreg 6.25 mg  mg p.o. twice daily, and Entresto 97-103 mg po twice daily.  3) Lipids/Hyperlipidemia:    Review of her recent lipid panel from 08/31/20 showed controlled LDL at 37 .  she is advised to continue Lipitor 80 mg daily at bedtime.  Side effects and precautions discussed with her.  4)  Weight/Diet:  her Body mass index is 33.83 kg/m.  -  clearly complicating her diabetes care.   she is a candidate for weight loss. I discussed with her the fact that loss of 5 - 10% of her  current body weight will have the most impact on her diabetes management.  Exercise, and detailed carbohydrates information provided  -  detailed on discharge instructions.  5) Chronic Care/Health Maintenance: -she is on ACEI/ARB and Statin medications and is encouraged to initiate and continue to follow up with Ophthalmology, Dentist, Podiatrist at least yearly or according to recommendations, and advised to stay away from smoking. I have recommended yearly flu vaccine and pneumonia vaccine at least every 5 years; moderate intensity exercise for up to 150 minutes weekly; and sleep for at least 7 hours a day.  - she is advised to maintain close follow up with Angiulli, Day G, MD for primary care needs, as well as her other providers for optimal and coordinated care.    I spent 35 minutes in the care of the patient today including review of labs from CMP, Lipids, Thyroid Function, Hematology (current and previous including abstractions from other facilities); face-to-face time discussing  her blood glucose readings/logs, discussing hypoglycemia and hyperglycemia episodes and symptoms, medications doses, her options of short and long term treatment  based on the latest standards of care / guidelines;  discussion about incorporating lifestyle medicine;  and documenting the encounter.    Please refer to Patient Instructions for Blood Glucose Monitoring and Insulin/Medications Dosing Guide"  in media tab for additional information. Please  also refer to " Patient Self Inventory" in the Media  tab for reviewed elements of pertinent patient history.  Sonia Morales participated in the discussions, expressed understanding, and voiced agreement with the above plans.  All questions were answered to her satisfaction. she is encouraged to contact clinic should she have any questions or concerns prior to her return visit.     Follow up plan: - Return in about 3 months (around 01/19/2021) for Diabetes F/U with A1c in office, No previsit labs, Bring meter and logs.    Ronny Bacon, [facility] [address] [place], [place] [address] Phone: [phone] Fax: [phone]  10/19/2020, 3:38 PM

## 2020-11-08 DIAGNOSIS — G4700 Insomnia, unspecified: Secondary | ICD-10-CM | POA: Diagnosis not present

## 2020-11-08 DIAGNOSIS — M47816 Spondylosis without myelopathy or radiculopathy, lumbar region: Secondary | ICD-10-CM | POA: Diagnosis not present

## 2020-11-08 DIAGNOSIS — G894 Chronic pain syndrome: Secondary | ICD-10-CM | POA: Diagnosis not present

## 2020-11-08 DIAGNOSIS — M47812 Spondylosis without myelopathy or radiculopathy, cervical region: Secondary | ICD-10-CM | POA: Diagnosis not present

## 2020-11-11 DIAGNOSIS — M545 Low back pain, unspecified: Secondary | ICD-10-CM | POA: Diagnosis not present

## 2020-11-11 DIAGNOSIS — R197 Diarrhea, unspecified: Secondary | ICD-10-CM | POA: Diagnosis not present

## 2020-11-11 DIAGNOSIS — Z6835 Body mass index (BMI) 35.0-35.9, adult: Secondary | ICD-10-CM | POA: Diagnosis not present

## 2020-11-11 DIAGNOSIS — F17210 Nicotine dependence, cigarettes, uncomplicated: Secondary | ICD-10-CM | POA: Diagnosis not present

## 2020-12-05 DIAGNOSIS — G4700 Insomnia, unspecified: Secondary | ICD-10-CM | POA: Diagnosis not present

## 2020-12-05 DIAGNOSIS — M47816 Spondylosis without myelopathy or radiculopathy, lumbar region: Secondary | ICD-10-CM | POA: Diagnosis not present

## 2020-12-05 DIAGNOSIS — M47812 Spondylosis without myelopathy or radiculopathy, cervical region: Secondary | ICD-10-CM | POA: Diagnosis not present

## 2020-12-05 DIAGNOSIS — G894 Chronic pain syndrome: Secondary | ICD-10-CM | POA: Diagnosis not present

## 2020-12-09 ENCOUNTER — Other Ambulatory Visit: Payer: Self-pay

## 2020-12-09 MED ORDER — CARVEDILOL 6.25 MG PO TABS: 6.2500 mg | ORAL_TABLET | Freq: Two times a day (BID) | ORAL | 1 refills | Status: DC

## 2020-12-15 ENCOUNTER — Telehealth: Payer: Self-pay

## 2020-12-15 NOTE — Telephone Encounter (Signed)
Given patient's history of high risk stress test would recommend patient go to the ED for further evaluation.

## 2020-12-16 NOTE — Telephone Encounter (Signed)
Called pt to inform her about the message above. Pt understood.

## 2020-12-20 DIAGNOSIS — M5416 Radiculopathy, lumbar region: Secondary | ICD-10-CM | POA: Diagnosis not present

## 2020-12-20 DIAGNOSIS — J441 Chronic obstructive pulmonary disease with (acute) exacerbation: Secondary | ICD-10-CM | POA: Diagnosis not present

## 2020-12-20 DIAGNOSIS — F17210 Nicotine dependence, cigarettes, uncomplicated: Secondary | ICD-10-CM | POA: Diagnosis not present

## 2020-12-27 DIAGNOSIS — K2100 Gastro-esophageal reflux disease with esophagitis, without bleeding: Secondary | ICD-10-CM | POA: Diagnosis not present

## 2020-12-27 DIAGNOSIS — E782 Mixed hyperlipidemia: Secondary | ICD-10-CM | POA: Diagnosis not present

## 2020-12-27 DIAGNOSIS — E039 Hypothyroidism, unspecified: Secondary | ICD-10-CM | POA: Diagnosis not present

## 2020-12-27 DIAGNOSIS — I10 Essential (primary) hypertension: Secondary | ICD-10-CM | POA: Diagnosis not present

## 2020-12-27 DIAGNOSIS — E7849 Other hyperlipidemia: Secondary | ICD-10-CM | POA: Diagnosis not present

## 2020-12-27 DIAGNOSIS — E1165 Type 2 diabetes mellitus with hyperglycemia: Secondary | ICD-10-CM | POA: Diagnosis not present

## 2020-12-28 DIAGNOSIS — M5416 Radiculopathy, lumbar region: Secondary | ICD-10-CM | POA: Diagnosis not present

## 2020-12-28 DIAGNOSIS — M25552 Pain in left hip: Secondary | ICD-10-CM | POA: Diagnosis not present

## 2020-12-28 DIAGNOSIS — M2569 Stiffness of other specified joint, not elsewhere classified: Secondary | ICD-10-CM | POA: Diagnosis not present

## 2020-12-28 DIAGNOSIS — R29898 Other symptoms and signs involving the musculoskeletal system: Secondary | ICD-10-CM | POA: Diagnosis not present

## 2020-12-28 DIAGNOSIS — M545 Low back pain, unspecified: Secondary | ICD-10-CM | POA: Diagnosis not present

## 2020-12-28 DIAGNOSIS — M79605 Pain in left leg: Secondary | ICD-10-CM | POA: Diagnosis not present

## 2020-12-30 DIAGNOSIS — R0902 Hypoxemia: Secondary | ICD-10-CM | POA: Diagnosis not present

## 2020-12-30 DIAGNOSIS — I959 Hypotension, unspecified: Secondary | ICD-10-CM | POA: Diagnosis not present

## 2020-12-30 DIAGNOSIS — J811 Chronic pulmonary edema: Secondary | ICD-10-CM | POA: Diagnosis not present

## 2020-12-30 DIAGNOSIS — R778 Other specified abnormalities of plasma proteins: Secondary | ICD-10-CM | POA: Diagnosis not present

## 2020-12-30 DIAGNOSIS — R0689 Other abnormalities of breathing: Secondary | ICD-10-CM | POA: Diagnosis not present

## 2020-12-30 DIAGNOSIS — R739 Hyperglycemia, unspecified: Secondary | ICD-10-CM | POA: Diagnosis not present

## 2020-12-30 DIAGNOSIS — E119 Type 2 diabetes mellitus without complications: Secondary | ICD-10-CM | POA: Diagnosis not present

## 2020-12-30 DIAGNOSIS — I110 Hypertensive heart disease with heart failure: Secondary | ICD-10-CM | POA: Diagnosis not present

## 2020-12-30 DIAGNOSIS — Z87891 Personal history of nicotine dependence: Secondary | ICD-10-CM | POA: Diagnosis not present

## 2020-12-30 DIAGNOSIS — R0602 Shortness of breath: Secondary | ICD-10-CM | POA: Diagnosis not present

## 2020-12-30 DIAGNOSIS — Z7982 Long term (current) use of aspirin: Secondary | ICD-10-CM | POA: Diagnosis not present

## 2020-12-30 DIAGNOSIS — R0789 Other chest pain: Secondary | ICD-10-CM | POA: Diagnosis not present

## 2020-12-30 DIAGNOSIS — R4702 Dysphasia: Secondary | ICD-10-CM | POA: Diagnosis not present

## 2020-12-30 DIAGNOSIS — Z28310 Unvaccinated for covid-19: Secondary | ICD-10-CM | POA: Diagnosis not present

## 2020-12-30 DIAGNOSIS — R9431 Abnormal electrocardiogram [ECG] [EKG]: Secondary | ICD-10-CM | POA: Diagnosis not present

## 2020-12-30 DIAGNOSIS — Z20822 Contact with and (suspected) exposure to covid-19: Secondary | ICD-10-CM | POA: Diagnosis not present

## 2020-12-30 DIAGNOSIS — R0600 Dyspnea, unspecified: Secondary | ICD-10-CM | POA: Diagnosis not present

## 2020-12-30 DIAGNOSIS — R000 Tachycardia, unspecified: Secondary | ICD-10-CM | POA: Diagnosis not present

## 2020-12-30 DIAGNOSIS — I509 Heart failure, unspecified: Secondary | ICD-10-CM | POA: Diagnosis not present

## 2020-12-31 ENCOUNTER — Inpatient Hospital Stay (HOSPITAL_COMMUNITY)
Admission: AD | Admit: 2020-12-31 | Discharge: 2021-01-12 | DRG: 233 | Disposition: A | Discharge status: Home / Self Care | Payer: Medicare Other | Source: Other Acute Inpatient Hospital | Attending: Thoracic Surgery (Cardiothoracic Vascular Surgery) | Admitting: Thoracic Surgery (Cardiothoracic Vascular Surgery)

## 2020-12-31 ENCOUNTER — Encounter (HOSPITAL_COMMUNITY): Payer: Self-pay | Admitting: Cardiology

## 2020-12-31 ENCOUNTER — Other Ambulatory Visit: Payer: Self-pay

## 2020-12-31 DIAGNOSIS — I130 Hypertensive heart and chronic kidney disease with heart failure and stage 1 through stage 4 chronic kidney disease, or unspecified chronic kidney disease: Secondary | ICD-10-CM | POA: Diagnosis not present

## 2020-12-31 DIAGNOSIS — E1122 Type 2 diabetes mellitus with diabetic chronic kidney disease: Secondary | ICD-10-CM | POA: Diagnosis not present

## 2020-12-31 DIAGNOSIS — R570 Cardiogenic shock: Secondary | ICD-10-CM

## 2020-12-31 DIAGNOSIS — Z09 Encounter for follow-up examination after completed treatment for conditions other than malignant neoplasm: Secondary | ICD-10-CM

## 2020-12-31 DIAGNOSIS — I459 Conduction disorder, unspecified: Secondary | ICD-10-CM | POA: Diagnosis present

## 2020-12-31 DIAGNOSIS — I5023 Acute on chronic systolic (congestive) heart failure: Secondary | ICD-10-CM | POA: Diagnosis not present

## 2020-12-31 DIAGNOSIS — Z951 Presence of aortocoronary bypass graft: Secondary | ICD-10-CM

## 2020-12-31 DIAGNOSIS — I252 Old myocardial infarction: Secondary | ICD-10-CM

## 2020-12-31 DIAGNOSIS — E669 Obesity, unspecified: Secondary | ICD-10-CM | POA: Diagnosis present

## 2020-12-31 DIAGNOSIS — J9601 Acute respiratory failure with hypoxia: Secondary | ICD-10-CM | POA: Diagnosis not present

## 2020-12-31 DIAGNOSIS — R0602 Shortness of breath: Secondary | ICD-10-CM | POA: Diagnosis not present

## 2020-12-31 DIAGNOSIS — E119 Type 2 diabetes mellitus without complications: Secondary | ICD-10-CM | POA: Diagnosis not present

## 2020-12-31 DIAGNOSIS — N179 Acute kidney failure, unspecified: Secondary | ICD-10-CM | POA: Diagnosis present

## 2020-12-31 DIAGNOSIS — N1832 Chronic kidney disease, stage 3b: Secondary | ICD-10-CM | POA: Diagnosis present

## 2020-12-31 DIAGNOSIS — J90 Pleural effusion, not elsewhere classified: Secondary | ICD-10-CM | POA: Diagnosis not present

## 2020-12-31 DIAGNOSIS — I509 Heart failure, unspecified: Secondary | ICD-10-CM

## 2020-12-31 DIAGNOSIS — Z8249 Family history of ischemic heart disease and other diseases of the circulatory system: Secondary | ICD-10-CM

## 2020-12-31 DIAGNOSIS — R079 Chest pain, unspecified: Secondary | ICD-10-CM | POA: Diagnosis not present

## 2020-12-31 DIAGNOSIS — D62 Acute posthemorrhagic anemia: Secondary | ICD-10-CM | POA: Diagnosis not present

## 2020-12-31 DIAGNOSIS — I25119 Atherosclerotic heart disease of native coronary artery with unspecified angina pectoris: Secondary | ICD-10-CM | POA: Diagnosis not present

## 2020-12-31 DIAGNOSIS — Z6833 Body mass index (BMI) 33.0-33.9, adult: Secondary | ICD-10-CM

## 2020-12-31 DIAGNOSIS — Z794 Long term (current) use of insulin: Secondary | ICD-10-CM

## 2020-12-31 DIAGNOSIS — Z9889 Other specified postprocedural states: Secondary | ICD-10-CM | POA: Diagnosis not present

## 2020-12-31 DIAGNOSIS — E1165 Type 2 diabetes mellitus with hyperglycemia: Secondary | ICD-10-CM | POA: Diagnosis present

## 2020-12-31 DIAGNOSIS — I110 Hypertensive heart disease with heart failure: Secondary | ICD-10-CM | POA: Diagnosis not present

## 2020-12-31 DIAGNOSIS — I25110 Atherosclerotic heart disease of native coronary artery with unstable angina pectoris: Secondary | ICD-10-CM | POA: Diagnosis present

## 2020-12-31 DIAGNOSIS — E7800 Pure hypercholesterolemia, unspecified: Secondary | ICD-10-CM | POA: Diagnosis present

## 2020-12-31 DIAGNOSIS — J811 Chronic pulmonary edema: Secondary | ICD-10-CM

## 2020-12-31 DIAGNOSIS — Z981 Arthrodesis status: Secondary | ICD-10-CM | POA: Diagnosis not present

## 2020-12-31 DIAGNOSIS — J939 Pneumothorax, unspecified: Secondary | ICD-10-CM

## 2020-12-31 DIAGNOSIS — I517 Cardiomegaly: Secondary | ICD-10-CM | POA: Diagnosis not present

## 2020-12-31 DIAGNOSIS — I5043 Acute on chronic combined systolic (congestive) and diastolic (congestive) heart failure: Secondary | ICD-10-CM | POA: Diagnosis present

## 2020-12-31 DIAGNOSIS — I669 Occlusion and stenosis of unspecified cerebral artery: Secondary | ICD-10-CM | POA: Diagnosis present

## 2020-12-31 DIAGNOSIS — I214 Non-ST elevation (NSTEMI) myocardial infarction: Secondary | ICD-10-CM | POA: Diagnosis not present

## 2020-12-31 DIAGNOSIS — Z87891 Personal history of nicotine dependence: Secondary | ICD-10-CM

## 2020-12-31 DIAGNOSIS — R778 Other specified abnormalities of plasma proteins: Secondary | ICD-10-CM | POA: Diagnosis not present

## 2020-12-31 DIAGNOSIS — Z79899 Other long term (current) drug therapy: Secondary | ICD-10-CM

## 2020-12-31 DIAGNOSIS — D696 Thrombocytopenia, unspecified: Secondary | ICD-10-CM | POA: Diagnosis present

## 2020-12-31 DIAGNOSIS — I5021 Acute systolic (congestive) heart failure: Secondary | ICD-10-CM | POA: Diagnosis not present

## 2020-12-31 DIAGNOSIS — I952 Hypotension due to drugs: Secondary | ICD-10-CM | POA: Diagnosis present

## 2020-12-31 DIAGNOSIS — Z20822 Contact with and (suspected) exposure to covid-19: Secondary | ICD-10-CM | POA: Diagnosis present

## 2020-12-31 DIAGNOSIS — I21A1 Myocardial infarction type 2: Secondary | ICD-10-CM | POA: Diagnosis not present

## 2020-12-31 DIAGNOSIS — I088 Other rheumatic multiple valve diseases: Secondary | ICD-10-CM | POA: Diagnosis not present

## 2020-12-31 DIAGNOSIS — I255 Ischemic cardiomyopathy: Secondary | ICD-10-CM | POA: Diagnosis present

## 2020-12-31 DIAGNOSIS — E871 Hypo-osmolality and hyponatremia: Secondary | ICD-10-CM | POA: Diagnosis not present

## 2020-12-31 DIAGNOSIS — Z01810 Encounter for preprocedural cardiovascular examination: Secondary | ICD-10-CM | POA: Diagnosis not present

## 2020-12-31 DIAGNOSIS — Z4682 Encounter for fitting and adjustment of non-vascular catheter: Secondary | ICD-10-CM | POA: Diagnosis not present

## 2020-12-31 DIAGNOSIS — I200 Unstable angina: Secondary | ICD-10-CM

## 2020-12-31 DIAGNOSIS — Z7982 Long term (current) use of aspirin: Secondary | ICD-10-CM

## 2020-12-31 DIAGNOSIS — R0902 Hypoxemia: Secondary | ICD-10-CM | POA: Diagnosis not present

## 2020-12-31 DIAGNOSIS — J9811 Atelectasis: Secondary | ICD-10-CM | POA: Diagnosis not present

## 2020-12-31 DIAGNOSIS — I700 Atherosclerosis of aorta: Secondary | ICD-10-CM | POA: Diagnosis not present

## 2020-12-31 DIAGNOSIS — I2510 Atherosclerotic heart disease of native coronary artery without angina pectoris: Secondary | ICD-10-CM | POA: Diagnosis not present

## 2020-12-31 DIAGNOSIS — Z955 Presence of coronary angioplasty implant and graft: Secondary | ICD-10-CM

## 2020-12-31 LAB — CBC WITH DIFFERENTIAL/PLATELET
Abs Immature Granulocytes: 0.03 10*3/uL (ref 0.00–0.07)
Basophils Absolute: 0 10*3/uL (ref 0.0–0.1)
Basophils Relative: 0 %
Eosinophils Absolute: 0 10*3/uL (ref 0.0–0.5)
Eosinophils Relative: 0 %
HCT: 36.3 % (ref 36.0–46.0)
Hemoglobin: 11.7 g/dL — ABNORMAL LOW (ref 12.0–15.0)
Immature Granulocytes: 0 %
Lymphocytes Relative: 17 %
Lymphs Abs: 1.8 10*3/uL (ref 0.7–4.0)
MCH: 29.9 pg (ref 26.0–34.0)
MCHC: 32.2 g/dL (ref 30.0–36.0)
MCV: 92.8 fL (ref 80.0–100.0)
Monocytes Absolute: 0.7 10*3/uL (ref 0.1–1.0)
Monocytes Relative: 7 %
Neutro Abs: 8.2 10*3/uL — ABNORMAL HIGH (ref 1.7–7.7)
Neutrophils Relative %: 76 %
Platelets: 234 10*3/uL (ref 150–400)
RBC: 3.91 MIL/uL (ref 3.87–5.11)
RDW: 13.9 % (ref 11.5–15.5)
WBC: 10.8 10*3/uL — ABNORMAL HIGH (ref 4.0–10.5)
nRBC: 0 % (ref 0.0–0.2)

## 2020-12-31 LAB — COMPREHENSIVE METABOLIC PANEL
ALT: 19 U/L (ref 0–44)
AST: 18 U/L (ref 15–41)
Albumin: 3.5 g/dL (ref 3.5–5.0)
Alkaline Phosphatase: 59 U/L (ref 38–126)
Anion gap: 9 (ref 5–15)
BUN: 44 mg/dL — ABNORMAL HIGH (ref 8–23)
CO2: 25 mmol/L (ref 22–32)
Calcium: 9.1 mg/dL (ref 8.9–10.3)
Chloride: 100 mmol/L (ref 98–111)
Creatinine, Ser: 1.16 mg/dL — ABNORMAL HIGH (ref 0.44–1.00)
GFR, Estimated: 53 mL/min — ABNORMAL LOW (ref >60)
Glucose, Bld: 246 mg/dL — ABNORMAL HIGH (ref 70–99)
Potassium: 4.3 mmol/L (ref 3.5–5.1)
Sodium: 134 mmol/L — ABNORMAL LOW (ref 135–145)
Total Bilirubin: 0.6 mg/dL (ref 0.3–1.2)
Total Protein: 6.6 g/dL (ref 6.5–8.1)

## 2020-12-31 LAB — HEMOGLOBIN A1C
Hgb A1c MFr Bld: 8.9 % — ABNORMAL HIGH (ref 4.8–5.6)
Mean Plasma Glucose: 208.73 mg/dL

## 2020-12-31 LAB — GLUCOSE, CAPILLARY: Glucose-Capillary: 251 mg/dL — ABNORMAL HIGH (ref 70–99)

## 2020-12-31 LAB — LIPID PANEL
Cholesterol: 115 mg/dL (ref 0–200)
HDL: 46 mg/dL (ref >40)
LDL Cholesterol: 52 mg/dL (ref 0–99)
Total CHOL/HDL Ratio: 2.5 RATIO
Triglycerides: 84 mg/dL (ref <150)
VLDL: 17 mg/dL (ref 0–40)

## 2020-12-31 LAB — HIV ANTIBODY (ROUTINE TESTING W REFLEX): HIV Screen 4th Generation wRfx: NONREACTIVE

## 2020-12-31 LAB — LACTIC ACID, PLASMA: Lactic Acid, Venous: 1.4 mmol/L (ref 0.5–1.9)

## 2020-12-31 MED ORDER — SODIUM CHLORIDE 0.9% FLUSH
3.0000 mL | Freq: Two times a day (BID) | INTRAVENOUS | Status: DC
  Administered 2020-12-31 – 2021-01-01 (×3): 3 mL via INTRAVENOUS

## 2020-12-31 MED ORDER — SODIUM CHLORIDE 0.9% FLUSH: 3.0000 mL | INTRAVENOUS | Status: DC | PRN

## 2020-12-31 MED ORDER — INSULIN ASPART PROT & ASPART (70-30 MIX) 100 UNIT/ML ~~LOC~~ SUSP
50.0000 [IU] | Freq: Two times a day (BID) | SUBCUTANEOUS | Status: DC
  Administered 2021-01-01 – 2021-01-04 (×7): 50 [IU] via SUBCUTANEOUS
  Filled 2020-12-31: qty 10

## 2020-12-31 MED ORDER — INSULIN ASPART 100 UNIT/ML IJ SOLN
0.0000 [IU] | Freq: Three times a day (TID) | INTRAMUSCULAR | Status: DC
  Administered 2021-01-01 (×2): 4 [IU] via SUBCUTANEOUS
  Administered 2021-01-01: 7 [IU] via SUBCUTANEOUS
  Administered 2021-01-02: 4 [IU] via SUBCUTANEOUS
  Administered 2021-01-03 (×2): 3 [IU] via SUBCUTANEOUS
  Administered 2021-01-03: 2 [IU] via SUBCUTANEOUS
  Administered 2021-01-04: 3 [IU] via SUBCUTANEOUS
  Administered 2021-01-05: 7 [IU] via SUBCUTANEOUS
  Administered 2021-01-06: 4 [IU] via SUBCUTANEOUS

## 2020-12-31 MED ORDER — SODIUM CHLORIDE 0.9 % IV SOLN
250.0000 mL | INTRAVENOUS | Status: DC | PRN
Start: 2020-12-31 — End: 2021-01-06
  Administered 2021-01-02: 250 mL via INTRAVENOUS

## 2020-12-31 MED ORDER — ACETAMINOPHEN 325 MG PO TABS
650.0000 mg | ORAL_TABLET | ORAL | Status: DC | PRN
  Administered 2021-01-02: 650 mg via ORAL
  Filled 2020-12-31: qty 2

## 2020-12-31 MED ORDER — ONDANSETRON HCL 4 MG/2ML IJ SOLN
4.0000 mg | Freq: Four times a day (QID) | INTRAMUSCULAR | Status: DC | PRN
Start: 2020-12-31 — End: 2021-01-06
  Administered 2021-01-01 – 2021-01-03 (×2): 4 mg via INTRAVENOUS
  Filled 2020-12-31: qty 2

## 2020-12-31 MED ORDER — FUROSEMIDE 10 MG/ML IJ SOLN
40.0000 mg | Freq: Two times a day (BID) | INTRAMUSCULAR | Status: DC
  Administered 2021-01-01 – 2021-01-02 (×4): 40 mg via INTRAVENOUS
  Filled 2020-12-31 (×4): qty 4

## 2020-12-31 MED ORDER — ATORVASTATIN CALCIUM 80 MG PO TABS
80.0000 mg | ORAL_TABLET | Freq: Every day | ORAL | Status: DC
  Administered 2020-12-31 – 2021-01-11 (×12): 80 mg via ORAL
  Filled 2020-12-31 (×12): qty 1

## 2020-12-31 MED ORDER — GABAPENTIN 300 MG PO CAPS
300.0000 mg | ORAL_CAPSULE | Freq: Every day | ORAL | Status: DC
  Administered 2021-01-01 – 2021-01-12 (×11): 300 mg via ORAL
  Filled 2020-12-31 (×11): qty 1

## 2020-12-31 MED ORDER — TRAZODONE HCL 50 MG PO TABS
50.0000 mg | ORAL_TABLET | Freq: Every day | ORAL | Status: DC
  Administered 2020-12-31 – 2021-01-05 (×6): 50 mg via ORAL
  Filled 2020-12-31 (×6): qty 1

## 2020-12-31 MED ORDER — HEPARIN SODIUM (PORCINE) 5000 UNIT/ML IJ SOLN
5000.0000 [IU] | Freq: Three times a day (TID) | INTRAMUSCULAR | Status: DC
  Administered 2020-12-31 – 2021-01-02 (×5): 5000 [IU] via SUBCUTANEOUS
  Filled 2020-12-31 (×5): qty 1

## 2020-12-31 MED ORDER — ASPIRIN 81 MG PO CHEW
81.0000 mg | CHEWABLE_TABLET | Freq: Every day | ORAL | Status: DC
  Administered 2021-01-01 – 2021-01-05 (×4): 81 mg via ORAL
  Filled 2020-12-31 (×4): qty 1

## 2020-12-31 MED ORDER — INSULIN ASPART 100 UNIT/ML IJ SOLN
0.0000 [IU] | Freq: Every day | INTRAMUSCULAR | Status: DC
  Administered 2020-12-31: 3 [IU] via SUBCUTANEOUS
  Administered 2021-01-05: 2 [IU] via SUBCUTANEOUS

## 2020-12-31 MED ORDER — FLUOXETINE HCL 20 MG PO CAPS
40.0000 mg | ORAL_CAPSULE | Freq: Every day | ORAL | Status: DC
  Administered 2021-01-01 – 2021-01-12 (×11): 40 mg via ORAL
  Filled 2020-12-31 (×11): qty 2

## 2020-12-31 NOTE — H&P (Signed)
Sonia Morales is an 65 y.o. female.   Chief Complaint: Acute heart failure HPI:   65 y.o. Caucasian female  with prior hypertension, hyperlipidemia, uncontrolled type 2 DM, CAD ( MI, LAD PCI 2017), ischemic cardiomyopathy, admitted with acute on chronic systolic heart failure  Patient presented to [facility] on 12/30/2020 with acute shortness of breath, was found to be in acute hypoxic respiratory failure, pulmonary edema. She was treated with BiPAP and IV diuresis, with significant improvement in her respiratory status. She has been diuresing well. Due to patient's cardiologist being in [place]-Dr. Jacinto Halim, and lack cardiologist at [facility], patient was transferred here.   Earlier in the day, patient's blood pressure dropped after receiving her home dose of Entresto 24-26 mg and carvedilol 6.25 mg. Since then, she has been on low dose dopamine. She continues to make urine. MAP Is >70 mmHg without dopamine here.   On further history, patient had been having retrosternal burning sensation with exertion, for a week, prior to her presentation. She currently denies any chest pain or burning sensation. She has had also had progressive exertional dyspnea and orthopnea, but denies leg edema. She was recently treated with antibiotics with her PCP with no relief.   She is currently requiring 6 L O2 at present.   Past Medical History:  Diagnosis Date   Acute anterior wall MI (HCC) 10/04/2015   Acute MI anterior wall first episode care [facility]) 10/04/2015   Coronary angiogram 10/04/2015: Occluded proximal LAD, superior takeoff of ramus intermediate, large circumflex, moderate to large RCA with mild diffuse disease. S/P thrombectomy, overlapping stents to the proximal LAD with 3.5 x 18, 3.5 x 12 mm Xience DES, 100% to 0%.    CAD (coronary artery disease), native coronary artery 10/11/2015   Coronary angiogram 10/04/2015: Left main mild calcification. LAD tortuous origin, occluded in the proximal segment.  Diagonal 2 is moderate-sized, has proximal 50% stenosis. S/P thrombectomy & DES overlapping 3.5 x 18 and 3.5 x 12 mm Xience Alpine stents,   Ramus intermediate has a tortuous origin. Circumflex mild disease. Right coronary artery mild diffuse disease   Colitis    DM (diabetes mellitus), type 2, uncontrolled with complications (HCC) 06/12/2018   H/O tobacco use, presenting hazards to health 06/12/2018   Quit in 2017 after MI   Hypercholesteremia 06/12/2018    Past Surgical History:  Procedure Laterality Date   CARDIAC CATHETERIZATION N/A 10/04/2015   Procedure: Left Heart Cath and Coronary Angiography;  Surgeon: Yates Decamp, MD;  Location: [facility] INVASIVE CV LAB;  Service: Cardiovascular;  Laterality: N/A;   CARDIAC CATHETERIZATION  10/04/2015   Procedure: Coronary Stent Intervention;  Surgeon: Yates Decamp, MD;  Location: [facility] INVASIVE CV LAB;  Service: Cardiovascular;;   PERIPHERAL VASCULAR CATHETERIZATION  10/04/2015   Procedure: Thrombectomy;  Surgeon: Yates Decamp, MD;  Location: [facility] INVASIVE CV LAB;  Service: Cardiovascular;;     Family History  Problem Relation Age of Onset   Heart attack Mother    Cancer Brother     Social History:  reports that she quit smoking about 2 years ago. Her smoking use included cigarettes. She has a 30.00 pack-year smoking history. She has never used smokeless tobacco. She reports that she does not drink alcohol and does not use drugs.  Allergies: No Known Allergies  Review of Systems  Constitutional: Negative for decreased appetite, malaise/fatigue, weight gain and weight loss.  HENT:  Negative for congestion.   Eyes:  Negative for visual disturbance.  Cardiovascular:  Positive for  dyspnea on exertion and orthopnea. Negative for chest pain, palpitations and syncope.  Respiratory:  Negative for cough.   Endocrine: Negative for cold intolerance.  Hematologic/Lymphatic: Does not bruise/bleed easily.  Skin:  Negative for itching and rash.  Musculoskeletal:  Negative  for myalgias.  Gastrointestinal:  Negative for abdominal pain, nausea and vomiting.  Genitourinary:  Negative for dysuria.  Neurological:  Negative for dizziness and weakness.  Psychiatric/Behavioral:  The patient is not nervous/anxious.   All other systems reviewed and are negative.  Today's Vitals   12/31/20 2100 12/31/20 2103  BP: 108/68   Pulse: 88   Resp: 17   Temp:  98.3 F (36.8 C)  TempSrc:  Oral  SpO2: 94%   Weight: 86.2 kg   Height: 5\' 3"  (1.6 m)   PainSc: 7     Body mass index is 33.66 kg/m.   Body mass index is 33.66 kg/m.  Physical Exam Vitals and nursing note reviewed.  Constitutional:      General: She is not in acute distress.    Appearance: She is well-developed. She is obese.  HENT:     Head: Normocephalic and atraumatic.  Eyes:     Conjunctiva/sclera: Conjunctivae normal.     Pupils: Pupils are equal, round, and reactive to light.  Neck:     Vascular: JVD present.  Cardiovascular:     Rate and Rhythm: Normal rate and regular rhythm.     Pulses: Normal pulses and intact distal pulses.     Heart sounds: No murmur heard. Pulmonary:     Effort: Pulmonary effort is normal.     Breath sounds: Rales (Bibasilar) present. No wheezing.  Abdominal:     General: Bowel sounds are normal.     Palpations: Abdomen is soft.     Tenderness: There is no rebound.  Musculoskeletal:        General: No tenderness. Normal range of motion.     Right lower leg: No edema.     Left lower leg: No edema.  Lymphadenopathy:     Cervical: No cervical adenopathy.  Skin:    General: Skin is warm and dry.  Neurological:     Mental Status: She is alert and oriented to person, place, and time.     Cranial Nerves: No cranial nerve deficit.    No results found for this or any previous visit (from the past 48 hour(s)).  Labs:   Lab Results  Component Value Date   WBC 7.0 01/13/2019   HGB 10.8 (L) 01/13/2019   HCT 33.6 (L) 01/13/2019   MCV 88 01/13/2019   PLT 222  01/13/2019   No results for input(s): NA, K, CL, CO2, BUN, CREATININE, CALCIUM, PROT, BILITOT, ALKPHOS, ALT, AST, GLUCOSE in the last 168 hours.  Invalid input(s): LABALBU  Lipid Panel     Component Value Date/Time   CHOL 121 01/13/2019 1349   TRIG 83 08/31/2020 0000   HDL 46 01/13/2019 1349   CHOLHDL 3.0 10/04/2015 1814   VLDL 13 10/04/2015 1814   LDLCALC 37 08/31/2020 0000   LDLCALC 59 01/13/2019 1349    BNP (last 3 results) No results for input(s): BNP in the last 8760 hours.  HEMOGLOBIN A1C Lab Results  Component Value Date   HGBA1C 11.1 08/31/2020   MPG 258 10/04/2015    Cardiac Panel (last 3 results) HS Trop 175-->312-->261-->171 Pro BNP 2505-->4098   No medications prior to admission.     No current facility-administered medications for this encounter.  Current  Outpatient Medications:    Accu-Chek Softclix Lancets lancets, Use as instructed to monitor glucose 4 times daily, Disp: 100 each, Rfl: 12   aspirin 81 MG tablet, Take 81 mg by mouth daily., Disp: , Rfl:    atorvastatin (LIPITOR) 80 MG tablet, Take 1 tablet (80 mg total) by mouth daily at 6 PM. (Patient taking differently: Take 80 mg by mouth at bedtime.), Disp: 30 tablet, Rfl: 0   carvedilol (COREG) 6.25 MG tablet, Take 1 tablet (6.25 mg total) by mouth 2 (two) times daily., Disp: 180 tablet, Rfl: 1   FLUoxetine (PROZAC) 40 MG capsule, Take 40 mg by mouth daily., Disp: , Rfl:    gabapentin (NEURONTIN) 300 MG capsule, Take 300 mg by mouth daily., Disp: , Rfl:    glucose blood (ACCU-CHEK GUIDE) test strip, Use as instructed to monitor glucose 4 times daily, Disp: 100 each, Rfl: 12   insulin aspart protamine- aspart (NOVOLOG MIX 70/30) (70-30) 100 UNIT/ML injection, Inject 50-60 Units into the skin 2 (two) times daily. Inject 50 units with breakfast and 60 units with supper if glucose is above 90 and she is eating, Disp: , Rfl:    Multiple Vitamin (MULTIVITAMIN) tablet, Take 1 tablet by mouth daily., Disp: ,  Rfl:    nitroGLYCERIN (NITROSTAT) 0.4 MG SL tablet, Place 1 tablet (0.4 mg total) under the tongue every 5 (five) minutes as needed for up to 25 days for chest pain., Disp: 25 tablet, Rfl: 3   Oxycodone HCl 10 MG TABS, Take 10 mg by mouth 3 (three) times daily as needed (Pain)., Disp: , Rfl:    Prasterone, DHEA, (DHEA 50 PO), Take by mouth daily., Disp: , Rfl:    sacubitril-valsartan (ENTRESTO) 97-103 MG, Take 1 tablet by mouth 2 (two) times daily., Disp: 180 tablet, Rfl: 3   traZODone (DESYREL) 50 MG tablet, Take 50 mg by mouth at bedtime. , Disp: , Rfl:    Today's Vitals   12/31/20 2100 12/31/20 2103  BP: 108/68   Pulse: 88   Resp: 17   Temp:  98.3 F (36.8 C)  TempSrc:  Oral  SpO2: 94%   Weight: 86.2 kg   Height: 5\' 3"  (1.6 m)   PainSc: 7     Body mass index is 33.66 kg/m.   CARDIAC STUDIES:  EKG 12/30/2020: Sinus rhythm Old anterior infarct Later T wave inversion, consider ischemia  Echocardiogram 03/29/2020:  Mildly depressed LV systolic function with visual EF 45-50%. Left  ventricle cavity is normal in size. Normal global wall motion. Doppler  evidence of grade I (impaired) diastolic dysfunction, elevated LAP.  No significant valvular abnormalities.  Compared to prior study dated 04/14/2019 no significant change.   Lexiscan Myoview Stress Test 12/29/2018: Lexiscan stress test was performed. Stress EKG is non-diagnostic, as this is pharmacological stress test. Perfusion images reveal a very large size anterior, anteroseptal, apical and apical inferior transmural scar with moderate amount of ischemia especially at the basal septal region from base to mid ventricle.  Dynamic gated images reveal anterior, anteroseptal and apical akinesis. Stress LV EF is severely dysfunctional 29%.  High risk study. No prior studies for comparision.  Coronary angiogram 10/04/2015:  Left main mild calcification. LAD tortuous origin, occluded in the proximal segment. Diagonal 2 is  moderate-sized, has proximal 50% stenosis. S/P thrombectomy & DES overlapping 3.5 x 18 and 3.5 x 12 mm Xience Alpine stents, 100% reduced to 0%. Ramus intermediate has a tortuous origin. Circumflex mild disease. Right coronary artery mild diffuse disease.  Markedly elevated LVEDP, 31 mmHg.  Carotid artery duplex 08/30/2020:  Stenosis in the right internal carotid artery (50-69%). Stenosis in the  right external carotid artery (<50%).  Stenosis in the left internal carotid artery (16-49%). Stenosis in the  left external carotid artery (<50%).  Antegrade right vertebral artery flow. Antegrade left vertebral artery  flow.  No significant change since 02/05/2020. Follow up in six months is  appropriate if clinically indicated.     Assessment/Plan  65 y.o. Caucasian female  with prior hypertension, hyperlipidemia, uncontrolled type 2 DM, CAD ( MI, LAD PCI 2017), ischemic cardiomyopathy, admitted with acute on chronic systolic heart failure  Acute on chronic systolic heart failure: Initial presentation with acute hypoxic respiratory failure, pulmonary edema. Improved with BiPAP and diuresis.  Transient requirement of dopamine, likely due to medication induced hypotension. I do not believe she is in shock. Will check lactic acid.  Strict I/O, will use Foley catheter given unreliable use of Purewick catheter. Eventually, she will need right and left heart catheterization, given likely etiology being ischemic cardiomyopathy.  Hold Entresto and Coreg for now.  Use IV lasix 40 mg bid. K replacement, as needed.        CAD: Prior MI and LAD PCI 2017. I suspect she may have some further obstructive CAD now. HS trop 100s-300s, likely type 2 MI. Continue Aspirin, statin.  Coreg on hold due to acute decompensated heart failure.   Uncontrolled DM: A1C historically >11%. On 70-30 insulin at home, reportedly takes 40 U bid, but has notation regarding 50 U in am and 60 U in on, if BG>909. Will use the  latter dosing given her hyperglycemia. Additional HS correctional insulin, as needed    Elder Negus, MD Pager: [phone] Office: [phone]

## 2020-12-31 NOTE — Progress Notes (Signed)
eLink Physician-Brief Progress Note Patient Name: Sonia Morales DOB: [birth year] MRN: [id number]   Date of Service  12/31/2020  HPI/Events of Note  Brief HPI: transferred from [facility] for   65 yr old female admitted to ICU for AHRF from acute on chronic low ef systolic CHF exacerbation under Cardiology service.  Hx of Systolic CHF, CAD, DM, Hypercholesterolemia, smoker.  Notes, labs reviewed. Data: ECHO 2017: EF 25%.   Camera: Obese, on nasal o2, sats 93%. MAP 80, HR 92. Appear comfortable.    eICU Interventions  - continue care as per Dr Evlyn Clines. On lasix q12 hr.  - watch for hypoxemia and hypotension. - follow UOP - CBG goals < 180. To go on SSI coverage - follow labs, ECHO, Cardiology consultation in AM. - trend troponin. - Heparin sq as VTE prophylaxis - aspiration precautions.   Discussed with RN. No intervention needed from [place] at this time. Patient is under Cardiology service.      Intervention Category Major Interventions: Respiratory failure - evaluation and management Evaluation Type: New Patient Evaluation  [name] 12/31/2020, 8:57 PM

## 2020-12-31 NOTE — Progress Notes (Signed)
2100 Patient arrived to 2H26. MD notified. VSS. Patient dyspneic on exertion. Dopamine running at . Secondary PIV initiated. CHG bath administered.  2115 MD at bedside. BP stable. Dopamine titrated off.

## 2021-01-01 ENCOUNTER — Inpatient Hospital Stay (HOSPITAL_COMMUNITY): Payer: Medicare Other

## 2021-01-01 LAB — BASIC METABOLIC PANEL
Anion gap: 11 (ref 5–15)
BUN: 40 mg/dL — ABNORMAL HIGH (ref 8–23)
CO2: 30 mmol/L (ref 22–32)
Calcium: 9.5 mg/dL (ref 8.9–10.3)
Chloride: 96 mmol/L — ABNORMAL LOW (ref 98–111)
Creatinine, Ser: 1.02 mg/dL — ABNORMAL HIGH (ref 0.44–1.00)
GFR, Estimated: >60 mL/min (ref >60)
Glucose, Bld: 133 mg/dL — ABNORMAL HIGH (ref 70–99)
Potassium: 4.4 mmol/L (ref 3.5–5.1)
Sodium: 137 mmol/L (ref 135–145)

## 2021-01-01 LAB — LACTIC ACID, PLASMA
Lactic Acid, Venous: 1.3 mmol/L (ref 0.5–1.9)
Lactic Acid, Venous: 1.6 mmol/L (ref 0.5–1.9)
Lactic Acid, Venous: 1 mmol/L (ref 0.5–1.9)

## 2021-01-01 LAB — ECHOCARDIOGRAM COMPLETE
AR max vel: 2.28 cm2
AV Area VTI: 2.35 cm2
AV Area mean vel: 2.3 cm2
AV Mean grad: 2 mmHg
AV Peak grad: 4.5 mmHg
Ao pk vel: 1.06 m/s
Area-P 1/2: 3.46 cm2
Height: 63 in
MV VTI: 1.92 cm2
S' Lateral: 4.4 cm
Weight: 3040.58 oz

## 2021-01-01 LAB — GLUCOSE, CAPILLARY
Glucose-Capillary: 152 mg/dL — ABNORMAL HIGH (ref 70–99)
Glucose-Capillary: 165 mg/dL — ABNORMAL HIGH (ref 70–99)
Glucose-Capillary: 173 mg/dL — ABNORMAL HIGH (ref 70–99)
Glucose-Capillary: 175 mg/dL — ABNORMAL HIGH (ref 70–99)
Glucose-Capillary: 189 mg/dL — ABNORMAL HIGH (ref 70–99)
Glucose-Capillary: 221 mg/dL — ABNORMAL HIGH (ref 70–99)

## 2021-01-01 LAB — MRSA NEXT GEN BY PCR, NASAL: MRSA by PCR Next Gen: NOT DETECTED

## 2021-01-01 MED ORDER — NOREPINEPHRINE 4 MG/250ML-% IV SOLN
INTRAVENOUS | Status: AC
  Filled 2021-01-01: qty 250

## 2021-01-01 MED ORDER — SODIUM CHLORIDE 0.9 % IV SOLN
250.0000 mL | INTRAVENOUS | Status: DC
  Administered 2021-01-01 – 2021-01-02 (×2): 250 mL via INTRAVENOUS

## 2021-01-01 MED ORDER — IVABRADINE HCL 5 MG PO TABS
5.0000 mg | ORAL_TABLET | Freq: Two times a day (BID) | ORAL | Status: DC
  Filled 2021-01-01: qty 1

## 2021-01-01 MED ORDER — NOREPINEPHRINE 4 MG/250ML-% IV SOLN: 0.0000 ug/min | INTRAVENOUS | Status: DC

## 2021-01-01 MED ORDER — SODIUM CHLORIDE 0.9 % IV SOLN: INTRAVENOUS | Status: DC

## 2021-01-01 MED ORDER — SODIUM CHLORIDE 0.9 % IV SOLN: 250.0000 mL | INTRAVENOUS | Status: DC | PRN

## 2021-01-01 MED ORDER — SODIUM CHLORIDE 0.9% FLUSH: 3.0000 mL | INTRAVENOUS | Status: DC | PRN

## 2021-01-01 MED ORDER — PERFLUTREN LIPID MICROSPHERE
1.0000 mL | INTRAVENOUS | Status: AC | PRN
  Administered 2021-01-01: 3 mL via INTRAVENOUS
  Filled 2021-01-01: qty 10

## 2021-01-01 MED ORDER — OXYCODONE HCL 5 MG PO TABS
10.0000 mg | ORAL_TABLET | ORAL | Status: DC | PRN
  Administered 2021-01-01: 10 mg via ORAL
  Filled 2021-01-01: qty 2

## 2021-01-01 MED ORDER — NOREPINEPHRINE 4 MG/250ML-% IV SOLN
2.0000 ug/min | INTRAVENOUS | Status: DC
  Administered 2021-01-01: 2 ug/min via INTRAVENOUS

## 2021-01-01 MED ORDER — SPIRONOLACTONE 12.5 MG HALF TABLET: 12.5000 mg | ORAL_TABLET | Freq: Every day | ORAL | Status: DC

## 2021-01-01 MED ORDER — OXYCODONE HCL 5 MG PO TABS
10.0000 mg | ORAL_TABLET | Freq: Three times a day (TID) | ORAL | Status: DC | PRN
  Administered 2021-01-01 – 2021-01-06 (×10): 10 mg via ORAL
  Filled 2021-01-01 (×10): qty 2

## 2021-01-01 MED ORDER — SODIUM CHLORIDE 0.9% FLUSH
3.0000 mL | Freq: Two times a day (BID) | INTRAVENOUS | Status: DC
  Administered 2021-01-01 (×2): 3 mL via INTRAVENOUS

## 2021-01-01 MED ORDER — ASPIRIN 81 MG PO CHEW
81.0000 mg | CHEWABLE_TABLET | ORAL | Status: AC
  Administered 2021-01-02: 81 mg via ORAL
  Filled 2021-01-01: qty 1

## 2021-01-01 MED ORDER — CHLORHEXIDINE GLUCONATE CLOTH 2 % EX PADS
6.0000 | MEDICATED_PAD | Freq: Every day | CUTANEOUS | Status: DC
  Administered 2021-01-01 – 2021-01-06 (×5): 6 via TOPICAL

## 2021-01-01 NOTE — Progress Notes (Signed)
Subjective:  Breathing improving Has generalized abdominal pain and loss of appetite. No nausea, vomiting, diarrhea. No chest pain  Chronically on oxycodone 10 mg tid prn since her multiple spine surgeries several yes ago. Had not received it till this morning  Objective:  Vital Signs in the last 24 hours: Temp:  [98.2 F (36.8 C)-98.8 F (37.1 C)] 98.8 F (37.1 C) (09/18 0752) Pulse Rate:  [75-104] 98 (09/18 0600) Resp:  [16-20] 20 (09/18 0600) BP: (93-119)/(53-77) 119/77 (09/18 0600) SpO2:  [92 %-97 %] 96 % (09/18 0600) Weight:  [86.2 kg] 86.2 kg (09/17 2100)  Intake/Output from previous day: 09/17 0701 - 09/18 0700 In: -  Out: 1000 [Urine:1000]   Physical Exam Vitals and nursing note reviewed.  Constitutional:      General: She is not in acute distress.    Appearance: She is well-developed.  HENT:     Head: Normocephalic and atraumatic.  Eyes:     Conjunctiva/sclera: Conjunctivae normal.     Pupils: Pupils are equal, round, and reactive to light.  Neck:     Vascular: JVD present.  Cardiovascular:     Rate and Rhythm: Regular rhythm. Tachycardia present.     Pulses: Normal pulses and intact distal pulses.     Heart sounds: No murmur heard. Pulmonary:     Effort: Pulmonary effort is normal.     Breath sounds: Normal breath sounds. No wheezing or rales.  Abdominal:     General: Bowel sounds are normal.     Palpations: Abdomen is soft.     Tenderness: There is no rebound.  Musculoskeletal:        General: No tenderness. Normal range of motion.     Right lower leg: No edema.     Left lower leg: No edema.  Lymphadenopathy:     Cervical: No cervical adenopathy.  Skin:    General: Skin is warm and dry.  Neurological:     Mental Status: She is alert and oriented to person, place, and time.     Cranial Nerves: No cranial nerve deficit.     Lab Results: BMP Recent Labs    08/31/20 0000 12/31/20 2115 01/01/21 0138  NA  --  134* 137  K  --  4.3 4.4  CL   --  100 96*  CO2  --  25 30  GLUCOSE  --  246* 133*  BUN 24* 44* 40*  CREATININE 1.3* 1.16* 1.02*  CALCIUM  --  9.1 9.5  GFRNONAA 48 53* >60    CBC Recent Labs  Lab 12/31/20 2115  WBC 10.8*  RBC 3.91  HGB 11.7*  HCT 36.3  PLT 234  MCV 92.8  MCH 29.9  MCHC 32.2  RDW 13.9  LYMPHSABS 1.8  MONOABS 0.7  EOSABS 0.0  BASOSABS 0.0    HEMOGLOBIN A1C Lab Results  Component Value Date   HGBA1C 8.9 (H) 12/31/2020   MPG 208.73 12/31/2020    Cardiac Panel (last 3 results) (9/16-9/17 2022): HS Trop 175-->312-->261-->171 Pro BNP 2505-->4098  Lipid Panel     Component Value Date/Time   CHOL 115 12/31/2020 2115   CHOL 121 01/13/2019 1349   TRIG 84 12/31/2020 2115   HDL 46 12/31/2020 2115   HDL 46 01/13/2019 1349   CHOLHDL 2.5 12/31/2020 2115   VLDL 17 12/31/2020 2115   LDLCALC 52 12/31/2020 2115   LDLCALC 59 01/13/2019 1349   LDLDIRECT 57 01/13/2019 1349     Hepatic Function Panel Recent Labs  12/31/20 2115  PROT 6.6  ALBUMIN 3.5  AST 18  ALT 19  ALKPHOS 59  BILITOT 0.6   Chest Xray 01/01/2021: 1. Mild pulmonary interstitial edema is not significantly changed. 2. The left costophrenic angle is not imaged; a left pleural effusion can therefore not be excluded.    EKG 12/30/2020: Sinus rhythm Old anterior infarct Later T wave inversion, consider ischemia   Echocardiogram 03/29/2020:  Mildly depressed LV systolic function with visual EF 45-50%. Left  ventricle cavity is normal in size. Normal global wall motion. Doppler  evidence of grade I (impaired) diastolic dysfunction, elevated LAP.  No significant valvular abnormalities.  Compared to prior study dated 04/14/2019 no significant change.   Lexiscan Myoview Stress Test 12/29/2018: Lexiscan stress test was performed. Stress EKG is non-diagnostic, as this is pharmacological stress test. Perfusion images reveal a very large size anterior, anteroseptal, apical and apical inferior transmural scar with  moderate amount of ischemia especially at the basal septal region from base to mid ventricle.  Dynamic gated images reveal anterior, anteroseptal and apical akinesis. Stress LV EF is severely dysfunctional 29%.  High risk study. No prior studies for comparision.   Coronary angiogram 10/04/2015:  Left main mild calcification. LAD tortuous origin, occluded in the proximal segment. Diagonal 2 is moderate-sized, has proximal 50% stenosis. S/P thrombectomy & DES overlapping 3.5 x 18 and 3.5 x 12 mm Xience Alpine stents, 100% reduced to 0%. Ramus intermediate has a tortuous origin. Circumflex mild disease. Right coronary artery mild diffuse disease. Markedly elevated LVEDP, 31 mmHg.   Carotid artery duplex 08/30/2020:  Stenosis in the right internal carotid artery (50-69%). Stenosis in the  right external carotid artery (<50%).  Stenosis in the left internal carotid artery (16-49%). Stenosis in the  left external carotid artery (<50%).  Antegrade right vertebral artery flow. Antegrade left vertebral artery  flow.  No significant change since 02/05/2020. Follow up in six months is  appropriate if clinically indicated.       Assessment/Plan   65 y.o. Caucasian female  with prior hypertension, hyperlipidemia, uncontrolled type 2 DM, CAD ( MI, LAD PCI 2017), ischemic cardiomyopathy, admitted with acute on chronic systolic heart failure   Acute on chronic systolic heart failure: Initial presentation with acute hypoxic respiratory failure, pulmonary edema. Improved with BiPAP and diuresis.  Transient requirement of dopamine, likely due to medication induced hypotension (Entresto and coreg), now off it. Still requiring 4 L O2 Strict I/O, will use Foley catheter given unreliable use of Purewick catheter. Plan for right and left heart catheterization, given likely etiology being ischemic cardiomyopathy on 9/19.  Hold Entresto and Coreg for now.  Continue IV lasix 40 mg bid. K replacement, as needed.        Added spironolactone 12.5 mg daily, corlanor 5 mg bid.   CAD: Prior MI and LAD PCI 2017. I suspect she may have some further obstructive CAD now. HS trop 100s-300s, likely type 2 MI. Continue Aspirin, statin.  Coreg on hold due to acute decompensated heart failure.  Plan for right and left heart catheterization, given likely etiology being ischemic cardiomyopathy on 9/19.    Uncontrolled DM: A1C 8.9% On 70-30 insulin at home, reportedly takes 40 U bid, but has notation regarding 50 U in am and 60 U in on, if BG>90. Will use the latter dosing given her hyperglycemia. Additional HS correctional insulin, as needed    Abdominal pain: Generalized pain, no specific focal tenderness. Suspect this is due to congestive heart failure, as  well as narcotic withdrawal-patient ic chronically on oxycodone. Now ordered/  After echocardiogram is preformed and heart rate improves, will transfer out of ICU later today.   Elder Negus, MD Pager: [phone] Office: [phone]

## 2021-01-01 NOTE — Plan of Care (Signed)

## 2021-01-01 NOTE — Progress Notes (Signed)
No clinical change in symptoms, however, blood pressure is running lower than earlier. MAPS around 55 mmHg. Hold spironolactone, corlanor. Start norepinephrine at 2 mcg/min. I am optimistic that low dose norepi will be able stabilize her MAP. Arm cuff is reliable, therefore no arterial line needed at this time.  Check lactic acid. Continue to monitor urine output. Purewick has been reliable and she never needed Foley catheter.  Echocardiogram pending.  Working diagnosis: Acute on chronic systolic heart failure with mild cardiogenic shock  CRITICAL CARE Performed by: Truett Mainland   Total critical care time: 35 minutes   Critical care time was exclusive of separately billable procedures and treating other patients.   Critical care was necessary to treat or prevent imminent or life-threatening deterioration.   Critical care was time spent personally by me on the following activities: development of treatment plan with patient and/or surrogate as well as nursing, discussions with consultants, evaluation of patient's response to treatment, examination of patient, obtaining history from patient or surrogate, ordering and performing treatments and interventions, ordering and review of laboratory studies, ordering and review of radiographic studies, pulse oximetry and re-evaluation of patient's condition.      Elder Negus, MD Pager: [phone] Office: [phone]

## 2021-01-02 ENCOUNTER — Encounter (HOSPITAL_COMMUNITY): Payer: Self-pay | Admitting: Cardiology

## 2021-01-02 ENCOUNTER — Encounter (HOSPITAL_COMMUNITY)
Admission: AD | Disposition: A | Discharge status: Home / Self Care | Payer: Self-pay | Source: Other Acute Inpatient Hospital | Attending: Thoracic Surgery (Cardiothoracic Vascular Surgery)

## 2021-01-02 ENCOUNTER — Other Ambulatory Visit (HOSPITAL_COMMUNITY): Payer: Self-pay

## 2021-01-02 DIAGNOSIS — I5023 Acute on chronic systolic (congestive) heart failure: Secondary | ICD-10-CM

## 2021-01-02 DIAGNOSIS — I25119 Atherosclerotic heart disease of native coronary artery with unspecified angina pectoris: Secondary | ICD-10-CM | POA: Diagnosis not present

## 2021-01-02 HISTORY — PX: RIGHT/LEFT HEART CATH AND CORONARY ANGIOGRAPHY: CATH118266

## 2021-01-02 LAB — POCT I-STAT EG7
Acid-Base Excess: 4 mmol/L — ABNORMAL HIGH (ref 0.0–2.0)
Acid-Base Excess: 7 mmol/L — ABNORMAL HIGH (ref 0.0–2.0)
Bicarbonate: 30 mmol/L — ABNORMAL HIGH (ref 20.0–28.0)
Bicarbonate: 32.6 mmol/L — ABNORMAL HIGH (ref 20.0–28.0)
Calcium, Ion: 1.19 mmol/L (ref 1.15–1.40)
Calcium, Ion: 1.2 mmol/L (ref 1.15–1.40)
HCT: 37 % (ref 36.0–46.0)
HCT: 38 % (ref 36.0–46.0)
Hemoglobin: 12.6 g/dL (ref 12.0–15.0)
Hemoglobin: 12.9 g/dL (ref 12.0–15.0)
O2 Saturation: 59 %
O2 Saturation: 64 %
Potassium: 3.8 mmol/L (ref 3.5–5.1)
Potassium: 3.9 mmol/L (ref 3.5–5.1)
Sodium: 138 mmol/L (ref 135–145)
Sodium: 138 mmol/L (ref 135–145)
TCO2: 32 mmol/L (ref 22–32)
TCO2: 34 mmol/L — ABNORMAL HIGH (ref 22–32)
pCO2, Ven: 50.9 mmHg (ref 44.0–60.0)
pCO2, Ven: 51.5 mmHg (ref 44.0–60.0)
pH, Ven: 7.378 (ref 7.250–7.430)
pH, Ven: 7.409 (ref 7.250–7.430)
pO2, Ven: 31 mmHg — CL (ref 32.0–45.0)
pO2, Ven: 34 mmHg (ref 32.0–45.0)

## 2021-01-02 LAB — POCT I-STAT 7, (LYTES, BLD GAS, ICA,H+H)
Acid-Base Excess: 3 mmol/L — ABNORMAL HIGH (ref 0.0–2.0)
Bicarbonate: 28.5 mmol/L — ABNORMAL HIGH (ref 20.0–28.0)
Calcium, Ion: 1.12 mmol/L — ABNORMAL LOW (ref 1.15–1.40)
HCT: 36 % (ref 36.0–46.0)
Hemoglobin: 12.2 g/dL (ref 12.0–15.0)
O2 Saturation: 94 %
Potassium: 3.7 mmol/L (ref 3.5–5.1)
Sodium: 140 mmol/L (ref 135–145)
TCO2: 30 mmol/L (ref 22–32)
pCO2 arterial: 44.8 mmHg (ref 32.0–48.0)
pH, Arterial: 7.411 (ref 7.350–7.450)
pO2, Arterial: 69 mmHg — ABNORMAL LOW (ref 83.0–108.0)

## 2021-01-02 LAB — BASIC METABOLIC PANEL
Anion gap: 13 (ref 5–15)
BUN: 43 mg/dL — ABNORMAL HIGH (ref 8–23)
CO2: 24 mmol/L (ref 22–32)
Calcium: 9.1 mg/dL (ref 8.9–10.3)
Chloride: 96 mmol/L — ABNORMAL LOW (ref 98–111)
Creatinine, Ser: 1.41 mg/dL — ABNORMAL HIGH (ref 0.44–1.00)
GFR, Estimated: 42 mL/min — ABNORMAL LOW (ref >60)
Glucose, Bld: 158 mg/dL — ABNORMAL HIGH (ref 70–99)
Potassium: 4.2 mmol/L (ref 3.5–5.1)
Sodium: 133 mmol/L — ABNORMAL LOW (ref 135–145)

## 2021-01-02 LAB — POCT ACTIVATED CLOTTING TIME: Activated Clotting Time: 138 seconds

## 2021-01-02 LAB — GLUCOSE, CAPILLARY
Glucose-Capillary: 183 mg/dL — ABNORMAL HIGH (ref 70–99)
Glucose-Capillary: 156 mg/dL — ABNORMAL HIGH (ref 70–99)
Glucose-Capillary: 90 mg/dL (ref 70–99)
Glucose-Capillary: 81 mg/dL (ref 70–99)
Glucose-Capillary: 153 mg/dL — ABNORMAL HIGH (ref 70–99)

## 2021-01-02 LAB — CREATININE, SERUM
Creatinine, Ser: 1.21 mg/dL — ABNORMAL HIGH (ref 0.44–1.00)
GFR, Estimated: 50 mL/min — ABNORMAL LOW (ref >60)

## 2021-01-02 LAB — TSH: TSH: 1.444 u[IU]/mL (ref 0.350–4.500)

## 2021-01-02 SURGERY — RIGHT/LEFT HEART CATH AND CORONARY ANGIOGRAPHY
Anesthesia: LOCAL

## 2021-01-02 MED ORDER — HYDRALAZINE HCL 20 MG/ML IJ SOLN: 10.0000 mg | INTRAMUSCULAR | Status: AC | PRN

## 2021-01-02 MED ORDER — ONDANSETRON HCL 4 MG/2ML IJ SOLN
4.0000 mg | Freq: Four times a day (QID) | INTRAMUSCULAR | Status: DC | PRN
  Administered 2021-01-04: 4 mg via INTRAVENOUS
  Filled 2021-01-02 (×3): qty 2

## 2021-01-02 MED ORDER — SODIUM CHLORIDE 0.9% FLUSH
3.0000 mL | Freq: Two times a day (BID) | INTRAVENOUS | Status: DC
  Administered 2021-01-02 – 2021-01-05 (×6): 3 mL via INTRAVENOUS

## 2021-01-02 MED ORDER — VERAPAMIL HCL 2.5 MG/ML IV SOLN
INTRAVENOUS | Status: AC
  Filled 2021-01-02: qty 2

## 2021-01-02 MED ORDER — FENTANYL CITRATE (PF) 100 MCG/2ML IJ SOLN
INTRAMUSCULAR | Status: DC | PRN
  Administered 2021-01-02: 12.5 ug via INTRAVENOUS

## 2021-01-02 MED ORDER — IOHEXOL 350 MG/ML SOLN
INTRAVENOUS | Status: DC | PRN
  Administered 2021-01-02: 30 mL via INTRA_ARTERIAL

## 2021-01-02 MED ORDER — SODIUM CHLORIDE 0.9% FLUSH: 3.0000 mL | INTRAVENOUS | Status: DC | PRN

## 2021-01-02 MED ORDER — HEPARIN SODIUM (PORCINE) 1000 UNIT/ML IJ SOLN
INTRAMUSCULAR | Status: AC
  Filled 2021-01-02: qty 1

## 2021-01-02 MED ORDER — FENTANYL CITRATE (PF) 100 MCG/2ML IJ SOLN
INTRAMUSCULAR | Status: AC
  Filled 2021-01-02: qty 2

## 2021-01-02 MED ORDER — ACETAMINOPHEN 325 MG PO TABS: 650.0000 mg | ORAL_TABLET | ORAL | Status: DC | PRN

## 2021-01-02 MED ORDER — HEPARIN (PORCINE) IN NACL 1000-0.9 UT/500ML-% IV SOLN
INTRAVENOUS | Status: AC
  Filled 2021-01-02: qty 500

## 2021-01-02 MED ORDER — VERAPAMIL HCL 2.5 MG/ML IV SOLN
INTRAVENOUS | Status: DC | PRN
  Administered 2021-01-02: 5 mL via INTRA_ARTERIAL

## 2021-01-02 MED ORDER — HEPARIN (PORCINE) 25000 UT/250ML-% IV SOLN
1300.0000 [IU]/h | INTRAVENOUS | Status: DC
  Administered 2021-01-03: 900 [IU]/h via INTRAVENOUS
  Administered 2021-01-04: 1100 [IU]/h via INTRAVENOUS
  Administered 2021-01-05: 1150 [IU]/h via INTRAVENOUS
  Administered 2021-01-05: 1250 [IU]/h via INTRAVENOUS
  Administered 2021-01-06: 1300 [IU]/h via INTRAVENOUS
  Filled 2021-01-02 (×4): qty 250

## 2021-01-02 MED ORDER — HEPARIN SODIUM (PORCINE) 1000 UNIT/ML IJ SOLN
INTRAMUSCULAR | Status: DC | PRN
  Administered 2021-01-02: 4000 [IU] via INTRAVENOUS

## 2021-01-02 MED ORDER — MIDAZOLAM HCL 2 MG/2ML IJ SOLN
INTRAMUSCULAR | Status: AC
  Filled 2021-01-02: qty 2

## 2021-01-02 MED ORDER — LABETALOL HCL 5 MG/ML IV SOLN: 10.0000 mg | INTRAVENOUS | Status: AC | PRN

## 2021-01-02 MED ORDER — HEPARIN (PORCINE) IN NACL 1000-0.9 UT/500ML-% IV SOLN
INTRAVENOUS | Status: DC | PRN
  Administered 2021-01-02 (×2): 500 mL

## 2021-01-02 MED ORDER — LIDOCAINE HCL (PF) 1 % IJ SOLN
INTRAMUSCULAR | Status: AC
  Filled 2021-01-02: qty 30

## 2021-01-02 MED ORDER — LIDOCAINE HCL (PF) 1 % IJ SOLN
INTRAMUSCULAR | Status: DC | PRN
  Administered 2021-01-02: 8 mL via SUBCUTANEOUS
  Administered 2021-01-02 (×3): 2 mL via SUBCUTANEOUS

## 2021-01-02 MED ORDER — SODIUM CHLORIDE 0.9 % IV SOLN
250.0000 mL | INTRAVENOUS | Status: DC | PRN
  Administered 2021-01-05: 250 mL via INTRAVENOUS

## 2021-01-02 MED ORDER — MIDAZOLAM HCL 2 MG/2ML IJ SOLN
INTRAMUSCULAR | Status: DC | PRN
  Administered 2021-01-02: .5 mg via INTRAVENOUS

## 2021-01-02 MED ORDER — POLYETHYLENE GLYCOL 3350 17 G PO PACK
17.0000 g | PACK | Freq: Every day | ORAL | Status: DC | PRN
  Administered 2021-01-05: 17 g via ORAL
  Filled 2021-01-02: qty 1

## 2021-01-02 SURGICAL SUPPLY — 18 items
CATH 5FR JL3.5 JR4 ANG PIG MP (CATHETERS) ×1 IMPLANT
CATH SWAN GANZ 7F STRAIGHT (CATHETERS) ×1 IMPLANT
DEVICE RAD COMP TR BAND LRG (VASCULAR PRODUCTS) ×1 IMPLANT
ELECT DEFIB PAD ADLT CADENCE (PAD) ×1 IMPLANT
GLIDESHEATH SLEND A-KIT 6F 22G (SHEATH) ×2 IMPLANT
GLIDESHEATH SLEND SS 6F .021 (SHEATH) ×1 IMPLANT
GUIDEWIRE INQWIRE 1.5J.035X260 (WIRE) IMPLANT
INQWIRE 1.5J .035X260CM (WIRE) ×2
KIT HEART LEFT (KITS) ×2 IMPLANT
KIT MICROPUNCTURE NIT STIFF (SHEATH) ×1 IMPLANT
PACK CARDIAC CATHETERIZATION (CUSTOM PROCEDURE TRAY) ×2 IMPLANT
SHEATH GLIDE SLENDER 4/5FR (SHEATH) ×1 IMPLANT
SHEATH PINNACLE 7F 10CM (SHEATH) ×1 IMPLANT
SYR MEDRAD MARK 7 150ML (SYRINGE) ×2 IMPLANT
TRANSDUCER W/STOPCOCK (MISCELLANEOUS) ×2 IMPLANT
TUBING CIL FLEX 10 FLL-RA (TUBING) ×2 IMPLANT
WIRE EMERALD 3MM-J .025X260CM (WIRE) ×1 IMPLANT
WIRE MICROINTRODUCER 60CM (WIRE) ×1 IMPLANT

## 2021-01-02 NOTE — TOC Benefit Eligibility Note (Signed)
Patient Advocate Encounter  Insurance verification completed.     The patient does not have any pharmacy coverage.   Michaella Imai [place], CPhT Pharmacy Patient Advocate Specialist [facility] Direct Number: [phone]  Fax: [phone]        

## 2021-01-02 NOTE — CV Procedure (Signed)
Severe distal LM 95% stenosis Compensated ischemic cardiomyopathy Low normal pressures but not in shock CVTS consulted  Full report to follow   Elder Negus, MD Pager: [phone] Office: [phone]

## 2021-01-02 NOTE — H&P (View-Only) (Signed)
Subjective:  Breathing improving Still has generalized abdominal pain Appetite is better No nausea, vomiting, diarrhea. No chest pain   Objective:  Vital Signs in the last 24 hours: Temp:  [97.9 F (36.6 C)-98.8 F (37.1 C)] 97.9 F (36.6 C) (09/18 2000) Pulse Rate:  [79-109] 104 (09/19 0800) Resp:  [12-29] 19 (09/19 0800) BP: (65-121)/(30-88) 108/58 (09/19 0800) SpO2:  [84 %-97 %] 96 % (09/19 0800)  Intake/Output from previous day: 09/18 0701 - 09/19 0700 In: 634.6 [P.O.:50; I.V.:584.6] Out: 1400 [Urine:1400]   Physical Exam Vitals and nursing note reviewed.  Constitutional:      General: She is not in acute distress.    Appearance: She is well-developed.  HENT:     Head: Normocephalic and atraumatic.  Eyes:     Conjunctiva/sclera: Conjunctivae normal.     Pupils: Pupils are equal, round, and reactive to light.  Neck:     Vascular: JVD present.  Cardiovascular:     Rate and Rhythm: Regular rhythm. Tachycardia present.     Pulses: Normal pulses and intact distal pulses.     Heart sounds: No murmur heard. Pulmonary:     Effort: Pulmonary effort is normal.     Breath sounds: Normal breath sounds. No wheezing or rales.  Abdominal:     General: Bowel sounds are normal.     Palpations: Abdomen is soft.     Tenderness: There is no rebound.  Musculoskeletal:        General: No tenderness. Normal range of motion.     Right lower leg: No edema.     Left lower leg: No edema.  Lymphadenopathy:     Cervical: No cervical adenopathy.  Skin:    General: Skin is warm and dry.  Neurological:     Mental Status: She is alert and oriented to person, place, and time.     Cranial Nerves: No cranial nerve deficit.     Lab Results: BMP Recent Labs    12/31/20 2115 01/01/21 0138 01/02/21 0140  NA 134* 137 133*  K 4.3 4.4 4.2  CL 100 96* 96*  CO2 25 30 24   GLUCOSE 246* 133* 158*  BUN 44* 40* 43*  CREATININE 1.16* 1.02* 1.41*  CALCIUM 9.1 9.5 9.1  GFRNONAA 53* >60  42*     CBC Recent Labs  Lab 12/31/20 2115  WBC 10.8*  RBC 3.91  HGB 11.7*  HCT 36.3  PLT 234  MCV 92.8  MCH 29.9  MCHC 32.2  RDW 13.9  LYMPHSABS 1.8  MONOABS 0.7  EOSABS 0.0  BASOSABS 0.0     HEMOGLOBIN A1C Lab Results  Component Value Date   HGBA1C 8.9 (H) 12/31/2020   MPG 208.73 12/31/2020    Cardiac Panel (last 3 results) (9/16-9/17 2022): HS Trop 175-->312-->261-->171 Pro BNP 2505-->4098  Lipid Panel     Component Value Date/Time   CHOL 115 12/31/2020 2115   CHOL 121 01/13/2019 1349   TRIG 84 12/31/2020 2115   HDL 46 12/31/2020 2115   HDL 46 01/13/2019 1349   CHOLHDL 2.5 12/31/2020 2115   VLDL 17 12/31/2020 2115   LDLCALC 52 12/31/2020 2115   LDLCALC 59 01/13/2019 1349   LDLDIRECT 57 01/13/2019 1349     Hepatic Function Panel Recent Labs    12/31/20 2115  PROT 6.6  ALBUMIN 3.5  AST 18  ALT 19  ALKPHOS 59  BILITOT 0.6    Chest Xray 01/01/2021: 1. Mild pulmonary interstitial edema is not significantly changed. 2. The left  costophrenic angle is not imaged; a left pleural effusion can therefore not be excluded.    EKG 12/30/2020: Sinus rhythm Old anterior infarct Later T wave inversion, consider ischemia   Echocardiogram 03/29/2020:  Mildly depressed LV systolic function with visual EF 45-50%. Left  ventricle cavity is normal in size. Normal global wall motion. Doppler  evidence of grade I (impaired) diastolic dysfunction, elevated LAP.  No significant valvular abnormalities.  Compared to prior study dated 04/14/2019 no significant change.   Lexiscan Myoview Stress Test 12/29/2018: Lexiscan stress test was performed. Stress EKG is non-diagnostic, as this is pharmacological stress test. Perfusion images reveal a very large size anterior, anteroseptal, apical and apical inferior transmural scar with moderate amount of ischemia especially at the basal septal region from base to mid ventricle.  Dynamic gated images reveal anterior,  anteroseptal and apical akinesis. Stress LV EF is severely dysfunctional 29%.  High risk study. No prior studies for comparision.   Coronary angiogram 10/04/2015:  Left main mild calcification. LAD tortuous origin, occluded in the proximal segment. Diagonal 2 is moderate-sized, has proximal 50% stenosis. S/P thrombectomy & DES overlapping 3.5 x 18 and 3.5 x 12 mm Xience Alpine stents, 100% reduced to 0%. Ramus intermediate has a tortuous origin. Circumflex mild disease. Right coronary artery mild diffuse disease. Markedly elevated LVEDP, 31 mmHg.   Carotid artery duplex 08/30/2020:  Stenosis in the right internal carotid artery (50-69%). Stenosis in the  right external carotid artery (<50%).  Stenosis in the left internal carotid artery (16-49%). Stenosis in the  left external carotid artery (<50%).  Antegrade right vertebral artery flow. Antegrade left vertebral artery  flow.  No significant change since 02/05/2020. Follow up in six months is  appropriate if clinically indicated.       Assessment/Plan   65 y.o. Caucasian female  with prior hypertension, hyperlipidemia, uncontrolled type 2 DM, CAD ( MI, LAD PCI 2017), ischemic cardiomyopathy, admitted with acute on chronic systolic heart failure   Acute on chronic systolic heart failure: Initial presentation with acute hypoxic respiratory failure, pulmonary edema Brief period of hypotension requiring norepinephrine on 01/01/2021. Lactic acid stayed normal suggesting that she wa snot in shock. Now off norepinephrine. Maintaining MAP>70 mmHg. Strict I/O Plan for right and left heart catheterization, given likely etiology being ischemic cardiomyopathy on 9/19.  I will minimize contrast use. If she needs intervention, will need to stage it. Recheck Cr later this morning.  Hold Entresto and Coreg for now.  Holding lasix until after cath, given AKI  CAD: Prior MI and LAD PCI 2017. I suspect she may have some further obstructive CAD now. HS  trop 100s-300s, likely type 2 MI. Continue Aspirin, statin.  Coreg on hold due to acute decompensated heart failure.  Plan for right and left heart catheterization, given likely etiology being ischemic cardiomyopathy on 9/19.   AKI: Likely pre-renal due to hypotension on 9/18. Maintaining good urine output. I expect cr to improve.  Hold lasix for now until after cath   Uncontrolled DM: A1C 8.9% On 70-30 insulin at home, reportedly takes 40 U bid, but has notation regarding 50 U in am and 60 U in on, if BG>90. Will use the latter dosing given her hyperglycemia. Additional HS correctional insulin, as needed    Abdominal pain: Generalized pain, no specific focal tenderness. Suspect this is due to congestive heart failure.    Elder Negus, MD Pager: [phone] Office: [phone]

## 2021-01-02 NOTE — Consult Note (Addendum)
[address]       [place] [address]             [phone]        KAYLN THISSELL [facility] Medical Record #[id number] Date of Birth: 08/11/1955  Referring: No ref. provider found Primary Care: Richardean Chimera, MD Primary Cardiologist:None  Chief Complaint:   Severe coronary artery disease   History of Present Illness:    Patient is a 65 year old female with history of multiple cardiac comorbidities including hypertension, hyperlipidemia, uncontrolled type 2 diabetes mellitus and known coronary artery disease.  She is status post myocardial infarction and LAD PCI in 2017.  She has known ischemic cardiomyopathy.  She was admitted to the hospital with acute on chronic systolic heart failure.  She presented with acute hypoxic respiratory failure with pulmonary edema to [facility] on 12/30/2020 with symptoms most notable for acute shortness of breath.  She was found to be in acute hypoxic respiratory failure.  She was treated with BiPAP and IV diuresis with improvement and was subsequently transferred to [facility] for further evaluation and treatment.  Her previous cardiology treatment has been by Dr. Jacinto Halim.  The patient also has noted that she has been having a burning sensation in her retrosternal region for approximately 1 week prior to presentation.  She also notes progressive exertional dyspnea and orthopnea and  mild lower extremity edema.  She was recently treated with antibiotics and inhaler by her PCP with no relief.  She was admitted for further evaluation and treatment has subsequently undergone cardiac catheterization and echocardiogram.  She is noted to have severe distal left main 95% stenosis with low normal pressures.  High sensitive troponins have been in the 100-300 range consistent with likely type II myocardial infarction.  She is continued on aspirin and statin but Coreg has been placed on hold.  It is noted that her A1c has historically been greater  than 11%.  Hemoglobin A1c on 12/31/2020 was 8.9.  She does take insulin 70/30 at home reportedly at 40 units twice daily the full reports are listed below.  We are asked to see the patient in cardiothoracic surgical consultation for consideration of coronary artery surgical revascularization.  It appears her creatinine peaked at 1.41 with a BUN of 43 but is currently trending down.    Current Activity/ Functional Status: Patient is independent with mobility/ambulation, transfers, ADL's, IADL's.   Zubrod Score: At the time of surgery this patient's most appropriate activity status/level should be described as: []     0    Normal activity, no symptoms []     1    Restricted in physical strenuous activity but ambulatory, able to do out light work []     2    Ambulatory and capable of self care, unable to do work activities, up and about                 more than 50%  Of the time                            []     3    Only limited self care, in bed greater than 50% of waking hours []     4    Completely disabled, no self care, confined to bed or chair []     5    Moribund  Past Medical History:  Diagnosis Date   Acute anterior  wall MI (HCC) 10/04/2015   Acute MI anterior wall first episode care [facility]) 10/04/2015   Coronary angiogram 10/04/2015: Occluded proximal LAD, superior takeoff of ramus intermediate, large circumflex, moderate to large RCA with mild diffuse disease. S/P thrombectomy, overlapping stents to the proximal LAD with 3.5 x 18, 3.5 x 12 mm Xience DES, 100% to 0%.    CAD (coronary artery disease), native coronary artery 10/11/2015   Coronary angiogram 10/04/2015: Left main mild calcification. LAD tortuous origin, occluded in the proximal segment. Diagonal 2 is moderate-sized, has proximal 50% stenosis. S/P thrombectomy & DES overlapping 3.5 x 18 and 3.5 x 12 mm Xience Alpine stents,   Ramus intermediate has a tortuous origin. Circumflex mild disease. Right coronary artery mild diffuse disease    Colitis    DM (diabetes mellitus), type 2, uncontrolled with complications (HCC) 06/12/2018   H/O tobacco use, presenting hazards to health 06/12/2018   Quit in 2017 after MI   Hypercholesteremia 06/12/2018    Past Surgical History:  Procedure Laterality Date   CARDIAC CATHETERIZATION N/A 10/04/2015   Procedure: Left Heart Cath and Coronary Angiography;  Surgeon: Yates Decamp, MD;  Location: [facility] INVASIVE CV LAB;  Service: Cardiovascular;  Laterality: N/A;   CARDIAC CATHETERIZATION  10/04/2015   Procedure: Coronary Stent Intervention;  Surgeon: Yates Decamp, MD;  Location: [facility] INVASIVE CV LAB;  Service: Cardiovascular;;   PERIPHERAL VASCULAR CATHETERIZATION  10/04/2015   Procedure: Thrombectomy;  Surgeon: Yates Decamp, MD;  Location: [facility] INVASIVE CV LAB;  Service: Cardiovascular;;  Multiple neck surgeries after fracture   Social History   Tobacco Use  Smoking Status Former   Packs/day: 1.00   Years: 30.00   Pack years: 30.00   Types: Cigarettes   Quit date: 10/04/2018   Years since quitting: 2.2  Smokeless Tobacco Never    Social History   Substance and Sexual Activity  Alcohol Use Never     No Known Allergies  Current Facility-Administered Medications  Medication Dose Route Frequency Provider Last Rate Last Admin   [MAR Hold] 0.9 %  sodium chloride infusion  250 mL Intravenous PRN Patwardhan, Manish J, MD 999 mL/hr at 01/02/21 1415 250 mL at 01/02/21 1415   0.9 %  sodium chloride infusion  250 mL Intravenous Continuous Mosetta Anis, RPH 20 mL/hr at 01/02/21 0600 Infusion Verify at 01/02/21 0600   0.9 %  sodium chloride infusion  250 mL Intravenous PRN Patwardhan, Manish J, MD       acetaminophen (TYLENOL) tablet 650 mg  650 mg Oral Q4H PRN Patwardhan, Anabel Bene, MD       [MAR Hold] aspirin chewable tablet 81 mg  81 mg Oral Daily Patwardhan, Manish J, MD   81 mg at 01/01/21 0907   [MAR Hold] atorvastatin (LIPITOR) tablet 80 mg  80 mg Oral QHS Patwardhan, Manish J, MD   80 mg at  01/01/21 2237   [MAR Hold] Chlorhexidine Gluconate Cloth 2 % PADS 6 each  6 each Topical Daily Patwardhan, Anabel Bene, MD   6 each at 01/01/21 1333   fentaNYL (SUBLIMAZE) injection    PRN Patwardhan, Manish J, MD   12.5 mcg at 01/02/21 1359   [MAR Hold] FLUoxetine (PROZAC) capsule 40 mg  40 mg Oral Daily Patwardhan, Manish J, MD   40 mg at 01/02/21 1030   [MAR Hold] gabapentin (NEURONTIN) capsule 300 mg  300 mg Oral Daily Patwardhan, Manish J, MD   300 mg at 01/02/21 1030   Heparin (Porcine) in NaCl 1000-0.9 UT/500ML-%  SOLN    PRN Elder Negus, MD   500 mL at 01/02/21 1455   [MAR Hold] heparin injection 5,000 Units  5,000 Units Subcutaneous Q8H Patwardhan, Manish J, MD   5,000 Units at 01/02/21 0601   heparin sodium (porcine) injection    PRN Elder Negus, MD   4,000 Units at 01/02/21 1433   hydrALAZINE (APRESOLINE) injection 10 mg  10 mg Intravenous Q20 Min PRN Patwardhan, Anabel Bene, MD       [MAR Hold] insulin aspart (novoLOG) injection 0-20 Units  0-20 Units Subcutaneous TID WC Patwardhan, Manish J, MD   4 Units at 01/02/21 0759   [MAR Hold] insulin aspart (novoLOG) injection 0-5 Units  0-5 Units Subcutaneous QHS Patwardhan, Manish J, MD   3 Units at 12/31/20 2301   [facility] Hold] insulin aspart protamine- aspart (NOVOLOG MIX 70/30) injection 50 Units  50 Units Subcutaneous BID WC Patwardhan, Manish J, MD   50 Units at 01/02/21 0800   iohexol (OMNIPAQUE) 350 MG/ML injection    PRN Patwardhan, Manish J, MD   30 mL at 01/02/21 1457   labetalol (NORMODYNE) injection 10 mg  10 mg Intravenous Q10 min PRN Patwardhan, Manish J, MD       lidocaine (PF) (XYLOCAINE) 1 % injection    PRN Patwardhan, Manish J, MD   2 mL at 01/02/21 1421   midazolam (VERSED) injection    PRN Elder Negus, MD   0.5 mg at 01/02/21 1400   [MAR Hold] norepinephrine (LEVOPHED) 4mg  in premix infusion  2-10 mcg/min Intravenous Titrated , [facility]   Stopped at 01/02/21 0307   [MAR Hold] ondansetron  (ZOFRAN) injection 4 mg  4 mg Intravenous Q6H PRN Patwardhan, Manish J, MD   4 mg at 01/01/21 0139   ondansetron (ZOFRAN) injection 4 mg  4 mg Intravenous Q6H PRN Patwardhan, 01/03/21, MD       [MAR Hold] oxyCODONE (Oxy IR/ROXICODONE) immediate release tablet 10 mg  10 mg Oral TID PRN Anabel Bene, MD   10 mg at 01/02/21 0834   [MAR Hold] polyethylene glycol (MIRALAX / GLYCOLAX) packet 17 g  17 g Oral Daily PRN Patwardhan, Manish J, MD       Radial Cocktail/Verapamil only    PRN Patwardhan, Manish J, MD   5 mL at 01/02/21 1423   [MAR Hold] sodium chloride flush (NS) 0.9 % injection 3 mL  3 mL Intravenous PRN Patwardhan, Manish J, MD       sodium chloride flush (NS) 0.9 % injection 3 mL  3 mL Intravenous Q12H Patwardhan, Manish J, MD       sodium chloride flush (NS) 0.9 % injection 3 mL  3 mL Intravenous PRN Patwardhan, 01/04/21, MD       [MAR Hold] traZODone (DESYREL) tablet 50 mg  50 mg Oral QHS Patwardhan, Manish J, MD   50 mg at 01/01/21 2237    Medications Prior to Admission  Medication Sig Dispense Refill Last Dose   atorvastatin (LIPITOR) 80 MG tablet Take 1 tablet (80 mg total) by mouth daily at 6 PM. (Patient taking differently: Take 80 mg by mouth at bedtime.) 30 tablet 0 12/29/2020 at 2200   carvedilol (COREG) 6.25 MG tablet Take 1 tablet (6.25 mg total) by mouth 2 (two) times daily. 180 tablet 1 12/29/2020 at 2200   FLUoxetine (PROZAC) 40 MG capsule Take 40 mg by mouth daily.   12/29/2020   gabapentin (NEURONTIN) 300 MG capsule Take  300 mg by mouth at bedtime.   12/29/2020   insulin aspart protamine- aspart (NOVOLOG MIX 70/30) (70-30) 100 UNIT/ML injection Inject 40 Units into the skin 2 (two) times daily. Inject 50 units with breakfast and 60 units with supper if glucose is above 90 and she is eating   12/29/2020   meloxicam (MOBIC) 15 MG tablet Take 15 mg by mouth every Monday, Wednesday, and Friday. Ordered daily but modified for metabolic clearance   12/28/2020   Multiple Vitamin  (MULTIVITAMIN) tablet Take 1 tablet by mouth daily.   12/29/2020   Oxycodone HCl 10 MG TABS Take 10-20 mg by mouth See admin instructions. Five times daily PRN for pain   12/31/2020   Prasterone, DHEA, (DHEA 50 PO) Take 1 capsule by mouth daily.   12/29/2020   sacubitril-valsartan (ENTRESTO) 97-103 MG Take 1 tablet by mouth 2 (two) times daily. 180 tablet 3 12/29/2020   traZODone (DESYREL) 50 MG tablet Take 50 mg by mouth at bedtime.    12/29/2020   Accu-Chek Softclix Lancets lancets Use as instructed to monitor glucose 4 times daily 100 each 12    glucose blood (ACCU-CHEK GUIDE) test strip Use as instructed to monitor glucose 4 times daily 100 each 12    nitroGLYCERIN (NITROSTAT) 0.4 MG SL tablet Place 1 tablet (0.4 mg total) under the tongue every 5 (five) minutes as needed for up to 25 days for chest pain. 25 tablet 3     Family History  Problem Relation Age of Onset   Heart attack Mother    Cancer Brother      Review of Systems:   Pertinent items are noted in HPI.     Cardiac Review of Systems: Y or  [    ]= no  Chest Pain [ y   ]  Resting SOB [   y] Exertional SOB  [ y ]  Orthopnea [  y]   Pedal Edema [ n  ]    Palpitations [ n ] Syncope  [  n]   Presyncope [   ]  General Review of Systems: [Y] = yes [  ]=no Constitional: recent weight change [ n ]; anorexia [ n ]; fatigue [ y ]; nausea [  ]; night sweats [  ]; fever [  ]; or chills [  ]                                                               Dental: Last Dentist visit:   Eye : blurred vision [ n ]; diplopia [ n  ]; vision changes [ n ];  Amaurosis fugax[  ]; Resp: cough [ n ];  wheezing[ n ];  hemoptysis[ n ]; shortness of breath[ Y]; paroxysmal nocturnal dyspnea[ n ]; dyspnea on exertion[ y ]; or orthopnea[  y];  GI:  gallstones[n  ], vomiting[ n ];  dysphagia[ n ]; melena[  ];  hematochezia [  ]; heartburn[ Y ];   Hx of  Colonoscopy[  ]; GU: kidney stones [ n ]; hematuria[ n ];   dysuria [  ];  nocturia[  ];  history of      obstruction [  ]; urinary frequency [  ]  Skin: rash, swelling[ n ];, hair loss[  ];  peripheral edema[ n ];  or itching[  ]; Musculosketetal: myalgias[ n ];  joint swelling[  ];  joint erythema[  ];  joint pain[  ];  back pain[  ];  Heme/Lymph: bruising[n  ];  bleeding[ n ];  anemia[  ];  Neuro: TIA[  n];  headaches[ n ];  stroke[ n ];  vertigo[n  ];  seizures[n  ];   paresthesias[  ];  difficulty walking[  ];  Psych:depression[ n ]; anxiety[ n ];  Endocrine: diabetes[  ];  thyroid dysfunction[  ];             na            Na       Physical Exam: BP (!) 92/49   Pulse 91   Temp 97.9 F (36.6 C) (Oral)   Resp 19   Ht 5\' 3"  (1.6 m)   Wt 86.2 kg   SpO2 94%   BMI 33.66 kg/m    General appearance: alert, cooperative, and no distress Head: Normocephalic, without obvious abnormality, atraumatic Neck: no adenopathy, no carotid bruit, no JVD, supple, symmetrical, trachea midline, and thyroid not enlarged, symmetric, no tenderness/mass/nodules Lymph nodes: Cervical, supraclavicular, and axillary nodes normal. Resp: clear to auscultation bilaterally Back: negative, symmetric, no curvature. ROM normal. No CVA tenderness. Cardio: regular rate and rhythm, S1, S2 normal, no murmur, click, rub or gallop GI: soft, non-tender; bowel sounds normal; no masses,  no organomegaly Extremities: extremities normal, atraumatic, no cyanosis or edema Neurologic: Grossly normal No carotid bruits Palpable DP/PT pulses Diagnostic Studies & Laboratory data:   Coronary Findings  Diagnostic Dominance: Right Left Main  Dist LM to Ost LAD lesion is 95% stenosed. The lesion is severely calcified.  Left Anterior Descending  Non-stenotic Ost LAD to Prox LAD lesion was previously treated. The lesion is type C and thrombotic.  Mid LAD to Dist LAD lesion is 30% stenosed. The lesion is segmental.  First Diagonal Branch  Ost 1st Diag lesion is 10% stenosed.  Second Diagonal 2nd Diag to  2nd Diag lesion is 50% stenosed.  Left Circumflex  Ost Cx lesion is 80% stenosed. The lesion is calcified.  Right Coronary Artery  Prox RCA to Mid RCA lesion is 20% stenosed. The lesion is segmental.  Intervention  No interventions have been documented. Right Heart  Right Heart Pressures RA: 1 mmHg RV: 21/0 mmHg PA: 25/3 mmHg, mPAP 14 mmHg PCW: 3 mmHg  CO: 4/5 L/min CI: 2.4 L/min/m2   Coronary Diagrams   Diagnostic Dominance: Right    Intervention    Recent Radiology Findings:   CARDIAC CATHETERIZATION  Result Date: 01/02/2021 LM: Distal LM bifurcation calcific 95% stenosis (1,0,1) LAD: Patent ostial LAD stent         Mid diffuse 30% disease Lcx: Ostial calcific 80% stenosis RCA: Mid 20% disease Severe distal LM stenosis (1,0,1) Compensated ischemic cardiomyopathy   DG Chest Port 1 View  Result Date: 01/01/2021 CLINICAL DATA:  Pulmonary edema EXAM: PORTABLE CHEST 1 VIEW COMPARISON:  Chest radiograph 1 day prior FINDINGS: The heart is enlarged, unchanged. The mediastinal contours are stable. There is vascular congestion with increased interstitial markings throughout both lungs likely reflecting mild pulmonary interstitial edema, overall not significantly changed. There is no right pleural effusion. The left costophrenic angle is cut off, and a left pleural effusion can not be excluded. There is no appreciable pneumothorax. The bones are stable. Cervical  spine fusion hardware is again noted. IMPRESSION: 1. Mild pulmonary interstitial edema is not significantly changed. 2. The left costophrenic angle is not imaged; a left pleural effusion can therefore not be excluded. Electronically Signed   By: Lesia Hausen M.D.   On: 01/01/2021 08:32   ECHOCARDIOGRAM COMPLETE  Result Date: 01/01/2021    ECHOCARDIOGRAM REPORT   Patient Name:   TOBIN CADIENTE Date of Exam: 01/01/2021 Medical Rec #:  [id number]      Height:       63.0 in Accession #:    [id number]     Weight:       190.0 lb Date  of Birth:  05-Jun-1955       BSA:          1.892 m Patient Age:    65 years       BP:           81/72 mmHg Patient Gender: F              HR:           83 bpm. Exam Location:  Inpatient Procedure: 2D Echo, Cardiac Doppler, Color Doppler and Intracardiac            Opacification Agent Indications:    CHF-Acute systolic  History:        Patient has prior history of Echocardiogram examinations, most                 recent 03/29/2020. CHF, CAD and Previous Myocardial Infarction;                 Risk Factors:Former Smoker, Dyslipidemia and Diabetes.  Sonographer:    Ross Ludwig RDCS (AE) Referring Phys: [id number] [facility] J PATWARDHAN IMPRESSIONS  1. Left ventricular ejection fraction, by estimation, is 30 to 35%. The left ventricle has moderately decreased function. The left ventricle demonstrates regional wall motion abnormalities (see scoring diagram/findings for description). Left ventricular  diastolic parameters are consistent with Grade I diastolic dysfunction (impaired relaxation). There is severe hypokinesis of the left ventricular, entire anteroseptal wall and anterior wall.  2. Right ventricular systolic function is normal. The right ventricular size is normal.  3. Left atrial size was mildly dilated.  4. A small pericardial effusion is present. The pericardial effusion is anterior to the right ventricle.  5. No significant valvular abnormality.  6. The inferior vena cava is normal in size with greater than 50% respiratory variability, suggesting right atrial pressure of 3 mmHg.  7. Compared to previous outpatient study in 03/2020, LVEF reduced from 45-50%. WMA is new. FINDINGS  Left Ventricle: Left ventricular ejection fraction, by estimation, is 30 to 35%. The left ventricle has moderately decreased function. The left ventricle demonstrates regional wall motion abnormalities. Severe hypokinesis of the left ventricular, entire  anteroseptal wall and anterior wall. Definity contrast agent was given IV to delineate  the left ventricular endocardial borders. The left ventricular internal cavity size was normal in size. There is no left ventricular hypertrophy. Left ventricular diastolic parameters are consistent with Grade I diastolic dysfunction (impaired relaxation). Right Ventricle: The right ventricular size is normal. No increase in right ventricular wall thickness. Right ventricular systolic function is normal. Left Atrium: Left atrial size was mildly dilated. Right Atrium: Right atrial size was normal in size. Pericardium: A small pericardial effusion is present. The pericardial effusion is anterior to the right ventricle. Mitral Valve: The mitral valve is grossly normal. No evidence of mitral valve regurgitation. MV peak  gradient, 2.8 mmHg. The mean mitral valve gradient is 1.0 mmHg. Tricuspid Valve: The tricuspid valve is grossly normal. Tricuspid valve regurgitation is not demonstrated. Aortic Valve: The aortic valve is tricuspid. Aortic valve regurgitation is not visualized. Aortic valve mean gradient measures 2.0 mmHg. Aortic valve peak gradient measures 4.5 mmHg. Aortic valve area, by VTI measures 2.35 cm. Pulmonic Valve: The pulmonic valve was grossly normal. Pulmonic valve regurgitation is not visualized. Aorta: The aortic root and ascending aorta are structurally normal, with no evidence of dilitation. Venous: The inferior vena cava is normal in size with greater than 50% respiratory variability, suggesting right atrial pressure of 3 mmHg. IAS/Shunts: No atrial level shunt detected by color flow Doppler.  LEFT VENTRICLE PLAX 2D LVIDd:         5.20 cm  Diastology LVIDs:         4.40 cm  LV e' medial:    3.45 cm/s LV PW:         1.40 cm  LV E/e' medial:  18.9 LV IVS:        0.60 cm  LV e' lateral:   3.45 cm/s LVOT diam:     2.00 cm  LV E/e' lateral: 18.9 LV SV:         40 LV SV Index:   21 LVOT Area:     3.14 cm  RIGHT VENTRICLE             IVC RV Basal diam:  2.90 cm     IVC diam: 1.50 cm RV S prime:     17.50  cm/s TAPSE (M-mode): 2.2 cm LEFT ATRIUM             Index       RIGHT ATRIUM           Index LA diam:        3.60 cm 1.90 cm/m  RA Area:     10.50 cm LA Vol (A2C):   39.9 ml 21.09 ml/m RA Volume:   20.90 ml  11.05 ml/m LA Vol (A4C):   64.8 ml 34.25 ml/m LA Biplane Vol: 54.9 ml 29.02 ml/m  AORTIC VALVE AV Area (Vmax):    2.28 cm AV Area (Vmean):   2.30 cm AV Area (VTI):     2.35 cm AV Vmax:           106.00 cm/s AV Vmean:          69.300 cm/s AV VTI:            0.170 m AV Peak Grad:      4.5 mmHg AV Mean Grad:      2.0 mmHg LVOT Vmax:         76.80 cm/s LVOT Vmean:        50.700 cm/s LVOT VTI:          0.127 m LVOT/AV VTI ratio: 0.75  AORTA Ao Root diam: 3.00 cm Ao Asc diam:  3.00 cm MITRAL VALVE MV Area (PHT): 3.46 cm    SHUNTS MV Area VTI:   1.92 cm    Systemic VTI:  0.13 m MV Peak grad:  2.8 mmHg    Systemic Diam: 2.00 cm MV Mean grad:  1.0 mmHg MV Vmax:       0.84 m/s MV Vmean:      53.8 cm/s MV Decel Time: 219 msec MV E velocity: 65.10 cm/s MV A velocity: 78.70 cm/s MV E/A ratio:  0.83 Manish Patwardhan MD Electronically signed by  Truett Mainland MD Signature Date/Time: 01/01/2021/3:13:08 PM    Final      I have independently reviewed the above radiologic studies and discussed with the patient   Recent Lab Findings: Lab Results  Component Value Date   WBC 10.8 (H) 12/31/2020   HGB 11.7 (L) 12/31/2020   HCT 36.3 12/31/2020   PLT 234 12/31/2020   GLUCOSE 158 (H) 01/02/2021   CHOL 115 12/31/2020   TRIG 84 12/31/2020   HDL 46 12/31/2020   LDLDIRECT 57 01/13/2019   LDLCALC 52 12/31/2020   ALT 19 12/31/2020   AST 18 12/31/2020   NA 133 (L) 01/02/2021   K 4.2 01/02/2021   CL 96 (L) 01/02/2021   CREATININE 1.21 (H) 01/02/2021   BUN 43 (H) 01/02/2021   CO2 24 01/02/2021   TSH 1.444 12/31/2020   HGBA1C 8.9 (H) 12/31/2020      Assessment / Plan: Acute on chronic systolic heart failure with presentation as described above. Severe left main and circumflex coronary artery disease,  previous LAD stent Type II MI Acute kidney injury Poorly controlled diabetes mellitus Previous myocardial infarction 2017 History of colitis Previous tobacco abuse quit 2017 after MI Hypercholesterolemia Extracranial cerebrovascular occlusive disease  With surgeon will evaluate the patient and all relevant studies and consideration of proceeding with CABG.   I  spent 40 minutes counseling the patient face to face.   Rowe Clack, PA-C  01/02/2021 4:58 PM    Agree with above.  Severe LM disease, and reduced EF.  No significant valvular disease.  Poorly controlled DM.  OR scheduled for Friday, but pt had anginal symptoms over night, and currently having epigastric pain.    OR today emergently for CABG 2.  Krystin Keeven Keane Scrape

## 2021-01-02 NOTE — Plan of Care (Signed)
  Problem: Nutrition: Goal: Adequate nutrition will be maintained Outcome: Not Progressing   Problem: Elimination: Goal: Will not experience complications related to bowel motility Outcome: Not Progressing   Problem: Pain Managment: Goal: General experience of comfort will improve Outcome: Not Progressing   Problem: Education: Goal: Knowledge of General Education information will improve Description: Including pain rating scale, medication(s)/side effects and non-pharmacologic comfort measures Outcome: Progressing   Problem: Clinical Measurements: Goal: Ability to maintain clinical measurements within normal limits will improve Outcome: Progressing Goal: Will remain free from infection Outcome: Progressing Goal: Respiratory complications will improve Outcome: Progressing Goal: Cardiovascular complication will be avoided Outcome: Progressing   Problem: Activity: Goal: Risk for activity intolerance will decrease Outcome: Progressing   Problem: Coping: Goal: Level of anxiety will decrease Outcome: Progressing   Problem: Elimination: Goal: Will not experience complications related to urinary retention Outcome: Progressing   Problem: Safety: Goal: Ability to remain free from injury will improve Outcome: Progressing   Problem: Skin Integrity: Goal: Risk for impaired skin integrity will decrease Outcome: Progressing

## 2021-01-02 NOTE — Progress Notes (Signed)
Subjective:  Feels tired.  No further abdominal discomfort or chest pain.  Dyspnea has improved.  Intake/Output from previous day:  I/O last 3 completed shifts: In: 763.2 [P.O.:260; I.V.:503.2] Out: 1975 [[id number]] No intake/output data recorded.  Blood pressure 120/71, pulse (!) 121, temperature 98.5 F (36.9 C), resp. rate (!) 21, height 5' 3"  (1.6 m), weight 86.2 kg, SpO2 92 %. Body mass index is 33.66 kg/m.   Vitals with BMI 01/03/2021 01/03/2021 01/03/2021  Height - - -  Weight - 190 lbs 1 oz -  BMI - 34.19 -  Systolic 379 024 097  Diastolic 71 59 353  Pulse 121 111 113    Physical Exam Constitutional:      General: She is not in acute distress.    Appearance: She is obese.  Eyes:     Conjunctiva/sclera: Conjunctivae normal.  Neck:     Vascular: No carotid bruit or JVD.  Cardiovascular:     Rate and Rhythm: Regular rhythm. Tachycardia present.     Pulses: Intact distal pulses.     Heart sounds: Normal heart sounds. No murmur heard.   No gallop.  Pulmonary:     Effort: Pulmonary effort is normal.     Breath sounds: Normal breath sounds.  Abdominal:     General: Bowel sounds are normal.     Palpations: Abdomen is soft.  Musculoskeletal:        General: No swelling.  Skin:    General: Skin is warm.     Capillary Refill: Capillary refill takes less than 2 seconds.  Neurological:     General: No focal deficit present.     Mental Status: She is alert.    Lab Results: BMP BNP (last 3 results) No results for input(s): BNP in the last 8760 hours.  ProBNP (last 3 results) No results for input(s): PROBNP in the last 8760 hours. BMP Latest Ref Rng & Units 01/03/2021 01/02/2021 01/02/2021  Glucose 70 - 99 mg/dL 195(H) - -  BUN 8 - 23 mg/dL 32(H) - -  Creatinine 0.44 - 1.00 mg/dL 0.85 - -  BUN/Creat Ratio 12 - 28 - - -  Sodium 135 - 145 mmol/L 134(L) 138 140  Potassium 3.5 - 5.1 mmol/L 4.0 3.8 3.7  Chloride 98 - 111 mmol/L 98 - -  CO2 22 - 32 mmol/L 25 - -   Calcium 8.9 - 10.3 mg/dL 9.4 - -   Hepatic Function Latest Ref Rng & Units 12/31/2020 01/13/2019  Total Protein 6.5 - 8.1 g/dL 6.6 6.7  Albumin 3.5 - 5.0 g/dL 3.5 4.1  AST 15 - 41 U/L 18 14  ALT 0 - 44 U/L 19 12  Alk Phosphatase 38 - 126 U/L 59 107  Total Bilirubin 0.3 - 1.2 mg/dL 0.6 <0.2   CBC Latest Ref Rng & Units 01/02/2021 01/02/2021 01/02/2021  WBC 4.0 - 10.5 K/uL - - -  Hemoglobin 12.0 - 15.0 g/dL 12.6 12.2 12.9  Hematocrit 36.0 - 46.0 % 37.0 36.0 38.0  Platelets 150 - 400 K/uL - - -   Lipid Panel     Component Value Date/Time   CHOL 115 12/31/2020 2115   CHOL 121 01/13/2019 1349   TRIG 84 12/31/2020 2115   HDL 46 12/31/2020 2115   HDL 46 01/13/2019 1349   CHOLHDL 2.5 12/31/2020 2115   VLDL 17 12/31/2020 2115   LDLCALC 52 12/31/2020 2115   LDLCALC 59 01/13/2019 1349   LDLDIRECT 57 01/13/2019 1349   Cardiac Panel (last 3  results) No results for input(s): CKTOTAL, CKMB, TROPONINI, RELINDX in the last 72 hours.  HEMOGLOBIN A1C Lab Results  Component Value Date   HGBA1C 8.9 (H) 12/31/2020   MPG 208.73 12/31/2020   TSH Recent Labs    12/31/20 2034  TSH 1.444   Peak proBNP 12/30/2020: 2505.  Cardiac Panel (last 3 results)  Peak high-sensitivity troponin on 12/30/2020 = 312.   Cardiac Studies:  Coronary angiogram 10/04/2015:  Left main mild calcification. LAD tortuous origin, occluded in the proximal segment. Diagonal 2 is moderate-sized, has proximal 50% stenosis. S/P thrombectomy & DES overlapping 3.5 x 18 and 3.5 x 12 mm Xience Alpine stents, 100% reduced to 0%. Ramus intermediate has a tortuous origin. Circumflex mild disease. Right coronary artery mild diffuse disease. Markedly elevated LVEDP, 31 mmHg.   Carotid artery duplex 08/30/2020:  Stenosis in the right internal carotid artery (50-69%). Stenosis in the  right external carotid artery (<50%).  Stenosis in the left internal carotid artery (16-49%). Stenosis in the  left external carotid artery (<50%).   Antegrade right vertebral artery flow. Antegrade left vertebral artery  flow.  No significant change since 02/05/2020. Follow up in six months is  appropriate if clinically indicated.  Echocardiogram 01/01/2021:  1. Left ventricular ejection fraction, by estimation, is 30 to 35%. The left ventricle has moderately decreased function. The left ventricle demonstrates regional wall motion abnormalities (see scoring diagram/findings for description). Left ventricular  diastolic parameters are consistent with Grade I diastolic dysfunction (impaired relaxation). There is severe hypokinesis of the left ventricular, entire anteroseptal wall and anterior wall.  2. Right ventricular systolic function is normal. The right ventricular size is normal.  3. Left atrial size was mildly dilated.  4. A small pericardial effusion is present. The pericardial effusion is anterior to the right ventricle.  5. No significant valvular abnormality.  6. The inferior vena cava is normal in size with greater than 50% respiratory variability, suggesting right atrial pressure of 3 mmHg.  7. Compared to previous outpatient study in 03/2020, LVEF reduced from 45-50%. WMA is new.  Right and Left Heart Catheterization 01/02/21:  Right Heart Pressures RA: 1 mmHg RV: 21/0 mmHg PA: 25/3 mmHg, mPAP 14 mmHg PCW: 3 mmHg  CO: 4/5 L/min CI: 2.4 L/min/m2    LM: Distal LM bifurcation calcific 95% stenosis (1,0,1) LAD: Patent ostial LAD stent         Mid diffuse 30% disease Lcx: Ostial calcific 80% stenosis RCA: Mid 20% disease   Severe distal LM stenosis (1,0,1) Compensated ischemic cardiomyopathy  EKG:   EKG 01/03/2021: Sinus tachycardia at a rate of 104 bpm, left axis deviation, left anterior fascicular block.  Poor R wave progression, cannot exclude anterolateral infarct old.  ST-T abnormality, cannot exclude high lateral ischemia.  No significant change from prior EKG on 01/01/2021.  EKG 01/01/2021: Normal sinus rhythm  at rate of 93 bpm, left axis deviation, left anterior fascicular block.  Poor R wave progression, cannot exclude anteroseptal infarct old.  ST-T wave abnormality, lateral ischemia.  Compared to 02/25/2020, ST depressions in 1 and aVL and V5 V6 is new.   Scheduled Meds:  aspirin  81 mg Oral Daily   atorvastatin  80 mg Oral QHS   Chlorhexidine Gluconate Cloth  6 each Topical Daily   FLUoxetine  40 mg Oral Daily   gabapentin  300 mg Oral Daily   insulin aspart  0-20 Units Subcutaneous TID WC   insulin aspart  0-5 Units Subcutaneous QHS   insulin  aspart protamine- aspart  50 Units Subcutaneous BID WC   sodium chloride flush  3 mL Intravenous Q12H   traZODone  50 mg Oral QHS   Continuous Infusions:  sodium chloride 250 mL (01/02/21 1415)   sodium chloride 250 mL (01/02/21 1827)   sodium chloride     heparin 900 Units/hr (01/03/21 0500)   norepinephrine (LEVOPHED) Adult infusion Stopped (01/02/21 0307)   Assessment/Plan:  1.  NSTEMI 2.  Acute systolic heart failure 3.  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 4.  Uncontrolled diabetes mellitus on insulin with acute on chronic kidney disease stage IIIb.  Recommendation: Patient awaiting CABG.  Had abdominal discomfort suggestive of angina pectoris.  This has resolved.  I will reinitiate beta-blocker therapy as her heart rate has been elevated.  Husband present and we extensively discussed regarding her coronary anatomy, need for lifestyle modification, need for cardiac rehab.  Heart failure symptoms are stable, she is now able to lay down flat without dyspnea.  We can transfer her out to telemetry floor.  She is already on high-dose statin, continue the same.  She is not on an ACE inhibitor or an ARB due to contrast exposure, low blood pressure and cardiogenic shock however we will try to reinitiate this once she is hemodynamically stable.  I spent 30 minutes in review of the  chart, discussion with the patient and her husband and greater than 50% of the time was spent with face-to-face encounter.   Adrian Prows, MD, [facility] 01/03/2021, 9:05 AM Office: [phone] Fax: [phone] Pager: [phone]

## 2021-01-02 NOTE — Interval H&P Note (Signed)
History and Physical Interval Note:  01/02/2021 1:22 PM  Sonia Morales  has presented today for surgery, with the diagnosis of NSTEMI.  The various methods of treatment have been discussed with the patient and family. After consideration of risks, benefits and other options for treatment, the patient has consented to  Procedure(s): RIGHT/LEFT HEART CATH AND CORONARY ANGIOGRAPHY (N/A) as a surgical intervention.  The patient's history has been reviewed, patient examined, no change in status, stable for surgery.  I have reviewed the patient's chart and labs.  Questions were answered to the patient's satisfaction.      2012 Appropriate Use Criteria for Diagnostic Catheterization Cardiomyopathies (Right and Left Heart Catheterization OR Right Heart Catheterization Alone With/Without Left Ventriculography and Coronary Angiography) Indication:  Known or suspected cardiomyopathy with or without heart failure A (7) Indication: 93; Score 7

## 2021-01-02 NOTE — Progress Notes (Signed)
Subjective:  Breathing improving Still has generalized abdominal pain Appetite is better No nausea, vomiting, diarrhea. No chest pain   Objective:  Vital Signs in the last 24 hours: Temp:  [97.9 F (36.6 C)-98.8 F (37.1 C)] 97.9 F (36.6 C) (09/18 2000) Pulse Rate:  [79-109] 104 (09/19 0800) Resp:  [12-29] 19 (09/19 0800) BP: (65-121)/(30-88) 108/58 (09/19 0800) SpO2:  [84 %-97 %] 96 % (09/19 0800)  Intake/Output from previous day: 09/18 0701 - 09/19 0700 In: 634.6 [P.O.:50; I.V.:584.6] Out: 1400 [Urine:1400]   Physical Exam Vitals and nursing note reviewed.  Constitutional:      General: She is not in acute distress.    Appearance: She is well-developed.  HENT:     Head: Normocephalic and atraumatic.  Eyes:     Conjunctiva/sclera: Conjunctivae normal.     Pupils: Pupils are equal, round, and reactive to light.  Neck:     Vascular: JVD present.  Cardiovascular:     Rate and Rhythm: Regular rhythm. Tachycardia present.     Pulses: Normal pulses and intact distal pulses.     Heart sounds: No murmur heard. Pulmonary:     Effort: Pulmonary effort is normal.     Breath sounds: Normal breath sounds. No wheezing or rales.  Abdominal:     General: Bowel sounds are normal.     Palpations: Abdomen is soft.     Tenderness: There is no rebound.  Musculoskeletal:        General: No tenderness. Normal range of motion.     Right lower leg: No edema.     Left lower leg: No edema.  Lymphadenopathy:     Cervical: No cervical adenopathy.  Skin:    General: Skin is warm and dry.  Neurological:     Mental Status: She is alert and oriented to person, place, and time.     Cranial Nerves: No cranial nerve deficit.     Lab Results: BMP Recent Labs    12/31/20 2115 01/01/21 0138 01/02/21 0140  NA 134* 137 133*  K 4.3 4.4 4.2  CL 100 96* 96*  CO2 25 30 24   GLUCOSE 246* 133* 158*  BUN 44* 40* 43*  CREATININE 1.16* 1.02* 1.41*  CALCIUM 9.1 9.5 9.1  GFRNONAA 53* >60  42*     CBC Recent Labs  Lab 12/31/20 2115  WBC 10.8*  RBC 3.91  HGB 11.7*  HCT 36.3  PLT 234  MCV 92.8  MCH 29.9  MCHC 32.2  RDW 13.9  LYMPHSABS 1.8  MONOABS 0.7  EOSABS 0.0  BASOSABS 0.0     HEMOGLOBIN A1C Lab Results  Component Value Date   HGBA1C 8.9 (H) 12/31/2020   MPG 208.73 12/31/2020    Cardiac Panel (last 3 results) (9/16-9/17 2022): HS Trop 175-->312-->261-->171 Pro BNP 2505-->4098  Lipid Panel     Component Value Date/Time   CHOL 115 12/31/2020 2115   CHOL 121 01/13/2019 1349   TRIG 84 12/31/2020 2115   HDL 46 12/31/2020 2115   HDL 46 01/13/2019 1349   CHOLHDL 2.5 12/31/2020 2115   VLDL 17 12/31/2020 2115   LDLCALC 52 12/31/2020 2115   LDLCALC 59 01/13/2019 1349   LDLDIRECT 57 01/13/2019 1349     Hepatic Function Panel Recent Labs    12/31/20 2115  PROT 6.6  ALBUMIN 3.5  AST 18  ALT 19  ALKPHOS 59  BILITOT 0.6    Chest Xray 01/01/2021: 1. Mild pulmonary interstitial edema is not significantly changed. 2. The left  costophrenic angle is not imaged; a left pleural effusion can therefore not be excluded.    EKG 12/30/2020: Sinus rhythm Old anterior infarct Later T wave inversion, consider ischemia   Echocardiogram 03/29/2020:  Mildly depressed LV systolic function with visual EF 45-50%. Left  ventricle cavity is normal in size. Normal global wall motion. Doppler  evidence of grade I (impaired) diastolic dysfunction, elevated LAP.  No significant valvular abnormalities.  Compared to prior study dated 04/14/2019 no significant change.   Lexiscan Myoview Stress Test 12/29/2018: Lexiscan stress test was performed. Stress EKG is non-diagnostic, as this is pharmacological stress test. Perfusion images reveal a very large size anterior, anteroseptal, apical and apical inferior transmural scar with moderate amount of ischemia especially at the basal septal region from base to mid ventricle.  Dynamic gated images reveal anterior,  anteroseptal and apical akinesis. Stress LV EF is severely dysfunctional 29%.  High risk study. No prior studies for comparision.   Coronary angiogram 10/04/2015:  Left main mild calcification. LAD tortuous origin, occluded in the proximal segment. Diagonal 2 is moderate-sized, has proximal 50% stenosis. S/P thrombectomy & DES overlapping 3.5 x 18 and 3.5 x 12 mm Xience Alpine stents, 100% reduced to 0%. Ramus intermediate has a tortuous origin. Circumflex mild disease. Right coronary artery mild diffuse disease. Markedly elevated LVEDP, 31 mmHg.   Carotid artery duplex 08/30/2020:  Stenosis in the right internal carotid artery (50-69%). Stenosis in the  right external carotid artery (<50%).  Stenosis in the left internal carotid artery (16-49%). Stenosis in the  left external carotid artery (<50%).  Antegrade right vertebral artery flow. Antegrade left vertebral artery  flow.  No significant change since 02/05/2020. Follow up in six months is  appropriate if clinically indicated.       Assessment/Plan   65 y.o. Caucasian female  with prior hypertension, hyperlipidemia, uncontrolled type 2 DM, CAD ( MI, LAD PCI 2017), ischemic cardiomyopathy, admitted with acute on chronic systolic heart failure   Acute on chronic systolic heart failure: Initial presentation with acute hypoxic respiratory failure, pulmonary edema Brief period of hypotension requiring norepinephrine on 01/01/2021. Lactic acid stayed normal suggesting that she wa snot in shock. Now off norepinephrine. Maintaining MAP>70 mmHg. Strict I/O Plan for right and left heart catheterization, given likely etiology being ischemic cardiomyopathy on 9/19.  I will minimize contrast use. If she needs intervention, will need to stage it. Recheck Cr later this morning.  Hold Entresto and Coreg for now.  Holding lasix until after cath, given AKI  CAD: Prior MI and LAD PCI 2017. I suspect she may have some further obstructive CAD now. HS  trop 100s-300s, likely type 2 MI. Continue Aspirin, statin.  Coreg on hold due to acute decompensated heart failure.  Plan for right and left heart catheterization, given likely etiology being ischemic cardiomyopathy on 9/19.   AKI: Likely pre-renal due to hypotension on 9/18. Maintaining good urine output. I expect cr to improve.  Hold lasix for now until after cath   Uncontrolled DM: A1C 8.9% On 70-30 insulin at home, reportedly takes 40 U bid, but has notation regarding 50 U in am and 60 U in on, if BG>90. Will use the latter dosing given her hyperglycemia. Additional HS correctional insulin, as needed    Abdominal pain: Generalized pain, no specific focal tenderness. Suspect this is due to congestive heart failure.    Sonia Negus, MD Pager: [phone] Office: [phone]

## 2021-01-02 NOTE — Progress Notes (Signed)
Heart Failure Nurse Navigator Progress Note  Following for HV TOC readiness. Planned L/RHC today. Off Levophed since 0300, last BP 98/60 (71). Resting in bed with family at bedside. Pt very tired, states "Don't feel well". Spouse states she just started not feeling well for about a week. Will complete interview process in near future.   ECHO: 9/22 30-35%, G1DD 2017 20-25%  Ozella Rocks, MSN, RN Heart Failure Nurse Navigator [phone]

## 2021-01-02 NOTE — Progress Notes (Signed)
ANTICOAGULATION CONSULT NOTE - Initial Consult  Pharmacy Consult for Heparin Indication: chest pain/ACS  No Known Allergies  Patient Measurements: Height: 5\' 3"  (160 cm) Weight: 86.2 kg (190 lb 0.6 oz) IBW/kg (Calculated) : 52.4 Heparin Dosing Weight: 71.7   Vital Signs: BP: 92/49 (09/19 1540) Pulse Rate: 91 (09/19 1540)  Labs: Recent Labs    12/31/20 2115 01/01/21 0138 01/02/21 0140 01/02/21 1141  HGB 11.7*  --   --   --   HCT 36.3  --   --   --   PLT 234  --   --   --   CREATININE 1.16* 1.02* 1.41* 1.21*    Estimated Creatinine Clearance: 49.5 mL/min (A) (by C-G formula based on SCr of 1.21 mg/dL (H)).   Medical History: Past Medical History:  Diagnosis Date   Acute anterior wall MI (HCC) 10/04/2015   Acute MI anterior wall first episode care [facility]) 10/04/2015   Coronary angiogram 10/04/2015: Occluded proximal LAD, superior takeoff of ramus intermediate, large circumflex, moderate to large RCA with mild diffuse disease. S/P thrombectomy, overlapping stents to the proximal LAD with 3.5 x 18, 3.5 x 12 mm Xience DES, 100% to 0%.    CAD (coronary artery disease), native coronary artery 10/11/2015   Coronary angiogram 10/04/2015: Left main mild calcification. LAD tortuous origin, occluded in the proximal segment. Diagonal 2 is moderate-sized, has proximal 50% stenosis. S/P thrombectomy & DES overlapping 3.5 x 18 and 3.5 x 12 mm Xience Alpine stents,   Ramus intermediate has a tortuous origin. Circumflex mild disease. Right coronary artery mild diffuse disease   Colitis    DM (diabetes mellitus), type 2, uncontrolled with complications (HCC) 06/12/2018   H/O tobacco use, presenting hazards to health 06/12/2018   Quit in 2017 after MI   Hypercholesteremia 06/12/2018    Medications:  Infusions:   sodium chloride 250 mL (01/02/21 1415)   sodium chloride 20 mL/hr at 01/02/21 0600   sodium chloride     norepinephrine (LEVOPHED) Adult infusion Stopped (01/02/21 01/04/21)     Assessment: 65 yo F presenting with acute on chronic HF. R/LHC on 9/19 showing severe distal LM stenosis and compensated ischemic cardiomyopathy. No documented anticoagulant prior to admission.  9/19 @1700  Sheath removed  Goal of Therapy:  Heparin level 0.3-0.7 units/ml Monitor platelets by anticoagulation protocol: Yes   Plan:  Patient received 4000 unit bolus during procedure Will start 900 units/hr 8 hours (1am) after sheath has removed Heparin level ordered for 9/20 @0700  Daily heparin level and CBC ordered Continue to monitor H&H and platelets  , PharmD Clinical Pharmacist  Please check AMION for all [facility] Pharmacy numbers After 10:00 PM, call Main Pharmacy [phone]

## 2021-01-02 NOTE — Plan of Care (Signed)

## 2021-01-02 NOTE — Progress Notes (Signed)
Plan of care includes heart cath for today. This RN prepped sites and CHG bath given. Precath asa given. Patient states she does not understand risks and outcomes of procedure. Unsigned consent placed on chart for completion.

## 2021-01-02 NOTE — Progress Notes (Signed)
Site area: Right groin a 7 french venous sheath was removed  Site Prior to Removal:  Level 0  Pressure Applied For 15 MINUTES    Bedrest Beginning at 1550 x 2 hours  Manual:   Yes.    Patient Status During Pull:  stable  Post Pull Groin Site:  Level 0  Post Pull Instructions Given:  Yes.    Post Pull Pulses Present:  Yes.    Dressing Applied:  Yes.    Comments:

## 2021-01-03 ENCOUNTER — Inpatient Hospital Stay (HOSPITAL_COMMUNITY): Payer: Medicare Other

## 2021-01-03 DIAGNOSIS — Z01810 Encounter for preprocedural cardiovascular examination: Secondary | ICD-10-CM | POA: Diagnosis not present

## 2021-01-03 LAB — GLUCOSE, CAPILLARY
Glucose-Capillary: 216 mg/dL — ABNORMAL HIGH (ref 70–99)
Glucose-Capillary: 144 mg/dL — ABNORMAL HIGH (ref 70–99)
Glucose-Capillary: 126 mg/dL — ABNORMAL HIGH (ref 70–99)
Glucose-Capillary: 97 mg/dL (ref 70–99)

## 2021-01-03 LAB — BASIC METABOLIC PANEL
Anion gap: 11 (ref 5–15)
BUN: 32 mg/dL — ABNORMAL HIGH (ref 8–23)
CO2: 25 mmol/L (ref 22–32)
Calcium: 9.4 mg/dL (ref 8.9–10.3)
Chloride: 98 mmol/L (ref 98–111)
Creatinine, Ser: 0.85 mg/dL (ref 0.44–1.00)
GFR, Estimated: >60 mL/min (ref >60)
Glucose, Bld: 195 mg/dL — ABNORMAL HIGH (ref 70–99)
Potassium: 4 mmol/L (ref 3.5–5.1)
Sodium: 134 mmol/L — ABNORMAL LOW (ref 135–145)

## 2021-01-03 LAB — HEPARIN LEVEL (UNFRACTIONATED)
Heparin Unfractionated: 0.23 IU/mL — ABNORMAL LOW (ref 0.30–0.70)
Heparin Unfractionated: 0.32 IU/mL (ref 0.30–0.70)

## 2021-01-03 MED ORDER — METOPROLOL TARTRATE 25 MG PO TABS
25.0000 mg | ORAL_TABLET | Freq: Two times a day (BID) | ORAL | Status: DC
  Administered 2021-01-03 – 2021-01-05 (×5): 25 mg via ORAL
  Filled 2021-01-03 (×6): qty 1

## 2021-01-03 MED ORDER — MORPHINE SULFATE (PF) 4 MG/ML IV SOLN
4.0000 mg | INTRAVENOUS | Status: DC | PRN
Start: 2021-01-03 — End: 2021-01-06
  Administered 2021-01-03 – 2021-01-05 (×6): 4 mg via INTRAVENOUS
  Filled 2021-01-03 (×6): qty 1

## 2021-01-03 NOTE — Progress Notes (Signed)
Heart Failure Nurse Navigator Progress Note  Continue to follow progression of hospitalization. Pt currently being evaluated by TCTS for possible CABG.  Will plan to see post op for education and potential need for HV TOC appt.  Ozella Rocks, MSN, RN Heart Failure Nurse Navigator [phone]

## 2021-01-03 NOTE — Progress Notes (Addendum)
ANTICOAGULATION CONSULT NOTE - Initial Consult  Pharmacy Consult for Heparin Indication: chest pain/ACS  No Known Allergies  Patient Measurements: Height: 5\' 3"  (160 cm) Weight: 86.2 kg (190 lb 0.6 oz) IBW/kg (Calculated) : 52.4 Heparin Dosing Weight: 71.7   Vital Signs: Temp: 98.5 F (36.9 C) (09/20 0834) Temp Source: Oral (09/20 0400) BP: 120/71 (09/20 0600) Pulse Rate: 121 (09/20 0600)  Labs: Recent Labs    12/31/20 2115 01/01/21 0138 01/02/21 0140 01/02/21 1141 01/02/21 1432 01/02/21 1433 01/02/21 1437 01/03/21 0153 01/03/21 0737  HGB 11.7*  --   --   --  12.9 12.2 12.6  --   --   HCT 36.3  --   --   --  38.0 36.0 37.0  --   --   PLT 234  --   --   --   --   --   --   --   --   HEPARINUNFRC  --   --   --   --   --   --   --   --  0.23*  CREATININE 1.16*   < > 1.41* 1.21*  --   --   --  0.85  --    < > = values in this interval not displayed.     Estimated Creatinine Clearance: 70.5 mL/min (by C-G formula based on SCr of 0.85 mg/dL).   Medical History: Past Medical History:  Diagnosis Date   Acute anterior wall MI (HCC) 10/04/2015   Acute MI anterior wall first episode care [facility]) 10/04/2015   Coronary angiogram 10/04/2015: Occluded proximal LAD, superior takeoff of ramus intermediate, large circumflex, moderate to large RCA with mild diffuse disease. S/P thrombectomy, overlapping stents to the proximal LAD with 3.5 x 18, 3.5 x 12 mm Xience DES, 100% to 0%.    CAD (coronary artery disease), native coronary artery 10/11/2015   Coronary angiogram 10/04/2015: Left main mild calcification. LAD tortuous origin, occluded in the proximal segment. Diagonal 2 is moderate-sized, has proximal 50% stenosis. S/P thrombectomy & DES overlapping 3.5 x 18 and 3.5 x 12 mm Xience Alpine stents,   Ramus intermediate has a tortuous origin. Circumflex mild disease. Right coronary artery mild diffuse disease   Colitis    DM (diabetes mellitus), type 2, uncontrolled with complications (HCC)  06/12/2018   H/O tobacco use, presenting hazards to health 06/12/2018   Quit in 2017 after MI   Hypercholesteremia 06/12/2018    Medications:  Infusions:   sodium chloride 250 mL (01/02/21 1415)   sodium chloride 250 mL (01/02/21 1827)   sodium chloride     heparin 900 Units/hr (01/03/21 0500)   norepinephrine (LEVOPHED) Adult infusion Stopped (01/02/21 01/04/21)    Assessment: 65 yo F presenting with acute on chronic HF. R/LHC on 9/19 showing severe distal LM stenosis and compensated ischemic cardiomyopathy. No documented anticoagulant prior to admission   On IV heparin while awaiting CABG assessment. Currently subtherapeutic this morning at 0.23 post sheath removal last night at 1am.   Hgb/Hct stable.  Goal of Therapy:  Heparin level 0.3-0.7 units/ml Monitor platelets by anticoagulation protocol: Yes   Plan:  Increase infusion to 1100 U/hr. Monitor CBC and heparin levels in 8 hours. Continue to monitor H&H and platelets   10/19  Pharm D. Candidate  [facility]   Please check AMION for all [facility] Pharmacy numbers After 10:00 PM, call Main Pharmacy [phone]

## 2021-01-03 NOTE — Progress Notes (Signed)
ANTICOAGULATION CONSULT NOTE - Follow Up Consult  Pharmacy Consult for Heparin Indication: chest pain/ACS  No Known Allergies  Patient Measurements: Height: 5\' 3"  (160 cm) Weight: 85.5 kg (188 lb 7.9 oz) IBW/kg (Calculated) : 52.4 Heparin Dosing Weight: 71.7   Vital Signs: Temp: 98.6 F (37 C) (09/20 2030) Temp Source: Oral (09/20 2030) BP: 157/137 (09/20 2030) Pulse Rate: 84 (09/20 2030)  Labs: Recent Labs    01/02/21 0140 01/02/21 1141 01/02/21 1432 01/02/21 1432 01/02/21 1433 01/02/21 1437 01/03/21 0153 01/03/21 0737 01/03/21 1953  HGB  --   --  12.9   < > 12.2 12.6  --   --   --   HCT  --   --  38.0  --  36.0 37.0  --   --   --   HEPARINUNFRC  --   --   --   --   --   --   --  0.23* 0.32  CREATININE 1.41* 1.21*  --   --   --   --  0.85  --   --    < > = values in this interval not displayed.     Estimated Creatinine Clearance: 70.2 mL/min (by C-G formula based on SCr of 0.85 mg/dL).   Medical History: Past Medical History:  Diagnosis Date   Acute anterior wall MI (HCC) 10/04/2015   Acute MI anterior wall first episode care [facility]) 10/04/2015   Coronary angiogram 10/04/2015: Occluded proximal LAD, superior takeoff of ramus intermediate, large circumflex, moderate to large RCA with mild diffuse disease. S/P thrombectomy, overlapping stents to the proximal LAD with 3.5 x 18, 3.5 x 12 mm Xience DES, 100% to 0%.    CAD (coronary artery disease), native coronary artery 10/11/2015   Coronary angiogram 10/04/2015: Left main mild calcification. LAD tortuous origin, occluded in the proximal segment. Diagonal 2 is moderate-sized, has proximal 50% stenosis. S/P thrombectomy & DES overlapping 3.5 x 18 and 3.5 x 12 mm Xience Alpine stents,   Ramus intermediate has a tortuous origin. Circumflex mild disease. Right coronary artery mild diffuse disease   Colitis    DM (diabetes mellitus), type 2, uncontrolled with complications (HCC) 06/12/2018   H/O tobacco use, presenting hazards to  health 06/12/2018   Quit in 2017 after MI   Hypercholesteremia 06/12/2018    Medications:  Infusions:   sodium chloride 250 mL (01/02/21 1415)   sodium chloride 250 mL (01/02/21 1827)   sodium chloride     heparin 1,100 Units/hr (01/03/21 1219)    Assessment: 65 yo F presenting with acute on chronic HF. R/LHC on 9/19 showing severe distal LM stenosis and compensated ischemic cardiomyopathy. No documented anticoagulant prior to admission   On IV heparin while awaiting CABG assessment.   Heparin level of 0.32 on heparin 1100 units/hr is therapeutic.   Goal of Therapy:  Heparin level 0.3-0.7 units/ml Monitor platelets by anticoagulation protocol: Yes   Plan:  Continue heparin 1100 units/hr  Next heparin level with morning labs   10/19, PharmD, BCPS Clinical Pharmacist 01/03/2021 9:37 PM

## 2021-01-03 NOTE — Progress Notes (Signed)
Pre cabg has been completed.   Preliminary results in CV Proc.   Vonita Calloway Razan Siler 01/03/2021 11:13 AM

## 2021-01-03 NOTE — Progress Notes (Signed)
CARDIAC REHAB PHASE I    Preop education completed with pt and family. Pt given IS, able to demonstrate ~1000. Reviewed importance of IS use, walks, and sternal precautions. Pt given cardiac surgery booklet along with in-the-tube sheet and [facility] care guide. Pt and family deny questions or concerns at this time. Will continue to follow throughout her hospital stay.  [id number] Reynold Bowen, RN BSN 01/03/2021 2:40 PM

## 2021-01-04 ENCOUNTER — Encounter (HOSPITAL_COMMUNITY): Payer: Medicare Other

## 2021-01-04 ENCOUNTER — Other Ambulatory Visit (HOSPITAL_COMMUNITY): Payer: Medicare Other

## 2021-01-04 ENCOUNTER — Encounter (HOSPITAL_COMMUNITY): Payer: Self-pay | Admitting: Cardiology

## 2021-01-04 LAB — BASIC METABOLIC PANEL
Anion gap: 13 (ref 5–15)
BUN: 39 mg/dL — ABNORMAL HIGH (ref 8–23)
CO2: 21 mmol/L — ABNORMAL LOW (ref 22–32)
Calcium: 9.2 mg/dL (ref 8.9–10.3)
Chloride: 102 mmol/L (ref 98–111)
Creatinine, Ser: 1 mg/dL (ref 0.44–1.00)
GFR, Estimated: >60 mL/min (ref >60)
Glucose, Bld: 92 mg/dL (ref 70–99)
Potassium: 4.6 mmol/L (ref 3.5–5.1)
Sodium: 136 mmol/L (ref 135–145)

## 2021-01-04 LAB — GLUCOSE, CAPILLARY
Glucose-Capillary: 124 mg/dL — ABNORMAL HIGH (ref 70–99)
Glucose-Capillary: 132 mg/dL — ABNORMAL HIGH (ref 70–99)
Glucose-Capillary: 71 mg/dL (ref 70–99)

## 2021-01-04 LAB — CBC
HCT: 36.9 % (ref 36.0–46.0)
Hemoglobin: 12 g/dL (ref 12.0–15.0)
MCH: 30 pg (ref 26.0–34.0)
MCHC: 32.5 g/dL (ref 30.0–36.0)
MCV: 92.3 fL (ref 80.0–100.0)
Platelets: 229 10*3/uL (ref 150–400)
RBC: 4 MIL/uL (ref 3.87–5.11)
RDW: 13.3 % (ref 11.5–15.5)
WBC: 9 10*3/uL (ref 4.0–10.5)
nRBC: 0 % (ref 0.0–0.2)

## 2021-01-04 LAB — PREPARE RBC (CROSSMATCH)

## 2021-01-04 LAB — SURGICAL PCR SCREEN
MRSA, PCR: NEGATIVE
Staphylococcus aureus: NEGATIVE

## 2021-01-04 LAB — SARS CORONAVIRUS 2 (TAT 6-24 HRS): SARS Coronavirus 2: NEGATIVE

## 2021-01-04 LAB — ABO/RH: ABO/RH(D): A NEG

## 2021-01-04 LAB — HEPARIN LEVEL (UNFRACTIONATED): Heparin Unfractionated: 0.31 IU/mL (ref 0.30–0.70)

## 2021-01-04 MED ORDER — TRANEXAMIC ACID (OHS) BOLUS VIA INFUSION
15.0000 mg/kg | INTRAVENOUS | Status: DC
  Filled 2021-01-04: qty 1284

## 2021-01-04 MED ORDER — PHENYLEPHRINE HCL-NACL 20-0.9 MG/250ML-% IV SOLN
30.0000 ug/min | INTRAVENOUS | Status: DC
Start: 2021-01-05 — End: 2021-01-05
  Filled 2021-01-04: qty 250

## 2021-01-04 MED ORDER — ONDANSETRON HCL 4 MG/2ML IJ SOLN
INTRAMUSCULAR | Status: AC
  Filled 2021-01-04: qty 2

## 2021-01-04 MED ORDER — PLASMA-LYTE A IV SOLN
INTRAVENOUS | Status: DC
  Filled 2021-01-04: qty 5

## 2021-01-04 MED ORDER — MILRINONE LACTATE IN DEXTROSE 20-5 MG/100ML-% IV SOLN
0.3000 ug/kg/min | INTRAVENOUS | Status: DC
  Filled 2021-01-04: qty 100

## 2021-01-04 MED ORDER — FENTANYL CITRATE (PF) 250 MCG/5ML IJ SOLN
INTRAMUSCULAR | Status: AC
  Filled 2021-01-04: qty 20

## 2021-01-04 MED ORDER — DEXMEDETOMIDINE HCL IN NACL 400 MCG/100ML IV SOLN
0.1000 ug/kg/h | INTRAVENOUS | Status: DC
Start: 2021-01-05 — End: 2021-01-05
  Filled 2021-01-04: qty 100

## 2021-01-04 MED ORDER — CHLORHEXIDINE GLUCONATE 0.12 % MT SOLN: 15.0000 mL | Freq: Once | OROMUCOSAL | Status: DC

## 2021-01-04 MED ORDER — CHLORHEXIDINE GLUCONATE CLOTH 2 % EX PADS
6.0000 | MEDICATED_PAD | Freq: Once | CUTANEOUS | Status: AC
  Administered 2021-01-04: 6 via TOPICAL

## 2021-01-04 MED ORDER — TRANEXAMIC ACID (OHS) PUMP PRIME SOLUTION
2.0000 mg/kg | INTRAVENOUS | Status: DC
  Filled 2021-01-04: qty 1.71

## 2021-01-04 MED ORDER — CEFAZOLIN SODIUM-DEXTROSE 2-4 GM/100ML-% IV SOLN
2.0000 g | INTRAVENOUS | Status: DC
  Filled 2021-01-04: qty 100

## 2021-01-04 MED ORDER — TEMAZEPAM 15 MG PO CAPS: 15.0000 mg | ORAL_CAPSULE | Freq: Once | ORAL | Status: DC | PRN

## 2021-01-04 MED ORDER — TRANEXAMIC ACID 1000 MG/10ML IV SOLN
1.5000 mg/kg/h | INTRAVENOUS | Status: DC
  Filled 2021-01-04: qty 25

## 2021-01-04 MED ORDER — POTASSIUM CHLORIDE 2 MEQ/ML IV SOLN
80.0000 meq | INTRAVENOUS | Status: DC
  Filled 2021-01-04: qty 40

## 2021-01-04 MED ORDER — EPINEPHRINE HCL 5 MG/250ML IV SOLN IN NS
0.0000 ug/min | INTRAVENOUS | Status: DC
  Filled 2021-01-04: qty 250

## 2021-01-04 MED ORDER — PROPOFOL 10 MG/ML IV BOLUS
INTRAVENOUS | Status: AC
  Filled 2021-01-04: qty 20

## 2021-01-04 MED ORDER — NITROGLYCERIN IN D5W 200-5 MCG/ML-% IV SOLN
2.0000 ug/min | INTRAVENOUS | Status: DC
  Filled 2021-01-04: qty 250

## 2021-01-04 MED ORDER — VANCOMYCIN HCL 1500 MG/300ML IV SOLN
1500.0000 mg | INTRAVENOUS | Status: DC
  Filled 2021-01-04: qty 300

## 2021-01-04 MED ORDER — NITROGLYCERIN IN D5W 200-5 MCG/ML-% IV SOLN
2.0000 ug/min | INTRAVENOUS | Status: DC
  Administered 2021-01-04: 5 ug/min via INTRAVENOUS
  Filled 2021-01-04: qty 250

## 2021-01-04 MED ORDER — PHENYLEPHRINE 40 MCG/ML (10ML) SYRINGE FOR IV PUSH (FOR BLOOD PRESSURE SUPPORT)
PREFILLED_SYRINGE | INTRAVENOUS | Status: AC
  Filled 2021-01-04: qty 10

## 2021-01-04 MED ORDER — FENTANYL CITRATE (PF) 250 MCG/5ML IJ SOLN
INTRAMUSCULAR | Status: AC
  Filled 2021-01-04: qty 5

## 2021-01-04 MED ORDER — NOREPINEPHRINE 4 MG/250ML-% IV SOLN
0.0000 ug/min | INTRAVENOUS | Status: DC
  Filled 2021-01-04 (×2): qty 250

## 2021-01-04 MED ORDER — METOPROLOL TARTRATE 12.5 MG HALF TABLET: 12.5000 mg | ORAL_TABLET | Freq: Once | ORAL | Status: DC

## 2021-01-04 MED ORDER — MIDAZOLAM HCL (PF) 10 MG/2ML IJ SOLN
INTRAMUSCULAR | Status: AC
  Filled 2021-01-04: qty 2

## 2021-01-04 MED ORDER — INSULIN REGULAR(HUMAN) IN NACL 100-0.9 UT/100ML-% IV SOLN
INTRAVENOUS | Status: DC
  Filled 2021-01-04: qty 100

## 2021-01-04 MED ORDER — MANNITOL 20 % IV SOLN
INTRAVENOUS | Status: DC
  Filled 2021-01-04: qty 13

## 2021-01-04 MED ORDER — HEPARIN 30,000 UNITS/1000 ML (OHS) CELLSAVER SOLUTION
Status: DC
  Filled 2021-01-04: qty 1000

## 2021-01-04 MED ORDER — BISACODYL 5 MG PO TBEC
5.0000 mg | DELAYED_RELEASE_TABLET | Freq: Once | ORAL | Status: AC
  Administered 2021-01-04: 5 mg via ORAL
  Filled 2021-01-04: qty 1

## 2021-01-04 NOTE — Care Management Important Message (Signed)
Important Message  Patient Details  Name: Sonia Morales MRN: [id number] Date of Birth: 1955/05/02   Medicare Important Message Given:  Yes     Dorena Bodo 01/04/2021, 11:39 AM

## 2021-01-04 NOTE — Progress Notes (Addendum)
Heart Failure Nurse Navigator Progress Note  Education Assessment and Provision:  Detailed education and instructions provided on heart failure disease management including the following:  Signs and symptoms of Heart Failure When to call the physician Importance of daily weights Low sodium diet Fluid restriction Medication management Anticipated future follow-up appointments  Patient education given on each of the above topics.  Patient acknowledges understanding via teach back method and acceptance of all instructions.  Education Materials:  "Living Better With Heart Failure" Booklet, HF zone tool, & Daily Weight Tracker Tool.  Patient has scale at home: yes Patient has pill box at home: yes  Completed [facility] information, no needs noted at this time. Pt states medications are affordable. Has patient assistance for one medication through manufacturer. Plan to visit Friday 9/23 post op day 2 for continued education and scheduled HV TOC appointment 10/4 @ 11AM to assist with GDMT/pt assistance and bridge back to [facility].   Ozella Rocks, MSN, RN Heart Failure Nurse Navigator [phone]

## 2021-01-04 NOTE — Progress Notes (Signed)
Subjective:  Feels tired.  She has had recurrence of chest tightness this morning in the form of burning sensation and also abdominal discomfort.  States that it is mild.  No nausea or vomiting.  Intake/Output from previous day:  I/O last 3 completed shifts: In: 509.9 [P.O.:420; I.V.:89.9] Out: 800 [Urine:800] No intake/output data recorded.  Blood pressure 96/67, pulse 79, temperature 98.6 F (37 C), temperature source Oral, resp. rate 18, height $RemoveBe'5\' 3"'ongmjVoOy$  (1.6 m), weight 85.6 kg, SpO2 92 %. Body mass index is 33.44 kg/m.   Vitals with BMI 01/04/2021 01/03/2021 01/03/2021  Height - - -  Weight 188 lbs 13 oz - -  BMI 57.32 - -  Systolic 96 80 94  Diastolic 67 55 56  Pulse 79 77 -    Physical Exam Constitutional:      General: She is not in acute distress.    Appearance: She is obese.  Eyes:     Conjunctiva/sclera: Conjunctivae normal.  Neck:     Vascular: No carotid bruit or JVD.  Cardiovascular:     Rate and Rhythm: Regular rhythm. Tachycardia present.     Pulses: Intact distal pulses.     Heart sounds: Normal heart sounds. No murmur heard.   No gallop.  Pulmonary:     Effort: Pulmonary effort is normal.     Breath sounds: Normal breath sounds.  Abdominal:     General: Bowel sounds are normal.     Palpations: Abdomen is soft.  Musculoskeletal:        General: No swelling.  Skin:    General: Skin is warm.     Capillary Refill: Capillary refill takes less than 2 seconds.  Neurological:     General: No focal deficit present.     Mental Status: She is alert.    Lab Results: BMP BNP (last 3 results) No results for input(s): BNP in the last 8760 hours.  ProBNP (last 3 results) No results for input(s): PROBNP in the last 8760 hours. BMP Latest Ref Rng & Units 01/04/2021 01/03/2021 01/02/2021  Glucose 70 - 99 mg/dL 92 195(H) -  BUN 8 - 23 mg/dL 39(H) 32(H) -  Creatinine 0.44 - 1.00 mg/dL 1.00 0.85 -  BUN/Creat Ratio 12 - 28 - - -  Sodium 135 - 145 mmol/L 136 134(L) 138   Potassium 3.5 - 5.1 mmol/L 4.6 4.0 3.8  Chloride 98 - 111 mmol/L 102 98 -  CO2 22 - 32 mmol/L 21(L) 25 -  Calcium 8.9 - 10.3 mg/dL 9.2 9.4 -   Hepatic Function Latest Ref Rng & Units 12/31/2020 01/13/2019  Total Protein 6.5 - 8.1 g/dL 6.6 6.7  Albumin 3.5 - 5.0 g/dL 3.5 4.1  AST 15 - 41 U/L 18 14  ALT 0 - 44 U/L 19 12  Alk Phosphatase 38 - 126 U/L 59 107  Total Bilirubin 0.3 - 1.2 mg/dL 0.6 <0.2   CBC Latest Ref Rng & Units 01/04/2021 01/02/2021 01/02/2021  WBC 4.0 - 10.5 K/uL 9.0 - -  Hemoglobin 12.0 - 15.0 g/dL 12.0 12.6 12.2  Hematocrit 36.0 - 46.0 % 36.9 37.0 36.0  Platelets 150 - 400 K/uL 229 - -   Lipid Panel     Component Value Date/Time   CHOL 115 12/31/2020 2115   CHOL 121 01/13/2019 1349   TRIG 84 12/31/2020 2115   HDL 46 12/31/2020 2115   HDL 46 01/13/2019 1349   CHOLHDL 2.5 12/31/2020 2115   VLDL 17 12/31/2020 2115   LDLCALC 52  12/31/2020 2115   LDLCALC 59 01/13/2019 1349   LDLDIRECT 57 01/13/2019 1349   Cardiac Panel (last 3 results) No results for input(s): CKTOTAL, CKMB, TROPONINI, RELINDX in the last 72 hours.  HEMOGLOBIN A1C Lab Results  Component Value Date   HGBA1C 8.9 (H) 12/31/2020   MPG 208.73 12/31/2020   TSH Recent Labs    12/31/20 2034  TSH 1.444    Peak proBNP 12/30/2020: 2505.  Cardiac Panel (last 3 results)  Peak high-sensitivity troponin on 12/30/2020 = 312.   Cardiac Studies:  Coronary angiogram 10/04/2015:  Left main mild calcification. LAD tortuous origin, occluded in the proximal segment. Diagonal 2 is moderate-sized, has proximal 50% stenosis. S/P thrombectomy & DES overlapping 3.5 x 18 and 3.5 x 12 mm Xience Alpine stents, 100% reduced to 0%. Ramus intermediate has a tortuous origin. Circumflex mild disease. Right coronary artery mild diffuse disease. Markedly elevated LVEDP, 31 mmHg.   Carotid artery duplex 08/30/2020:  Stenosis in the right internal carotid artery (50-69%). Stenosis in the  right external carotid artery  (<50%).  Stenosis in the left internal carotid artery (16-49%). Stenosis in the  left external carotid artery (<50%).  Antegrade right vertebral artery flow. Antegrade left vertebral artery  flow.  No significant change since 02/05/2020. Follow up in six months is  appropriate if clinically indicated.  Echocardiogram 01/01/2021:  1. Left ventricular ejection fraction, by estimation, is 30 to 35%. The left ventricle has moderately decreased function. The left ventricle demonstrates regional wall motion abnormalities (see scoring diagram/findings for description). Left ventricular  diastolic parameters are consistent with Grade I diastolic dysfunction (impaired relaxation). There is severe hypokinesis of the left ventricular, entire anteroseptal wall and anterior wall.  2. Right ventricular systolic function is normal. The right ventricular size is normal.  3. Left atrial size was mildly dilated.  4. A small pericardial effusion is present. The pericardial effusion is anterior to the right ventricle.  5. No significant valvular abnormality.  6. The inferior vena cava is normal in size with greater than 50% respiratory variability, suggesting right atrial pressure of 3 mmHg.  7. Compared to previous outpatient study in 03/2020, LVEF reduced from 45-50%. WMA is new.  Right and Left Heart Catheterization 01/02/21:  Right Heart Pressures RA: 1 mmHg RV: 21/0 mmHg PA: 25/3 mmHg, mPAP 14 mmHg PCW: 3 mmHg  CO: 4/5 L/min CI: 2.4 L/min/m2    LM: Distal LM bifurcation calcific 95% stenosis (1,0,1) LAD: Patent ostial LAD stent         Mid diffuse 30% disease Lcx: Ostial calcific 80% stenosis RCA: Mid 20% disease   Severe distal LM stenosis (1,0,1) Compensated ischemic cardiomyopathy  EKG:   EKG 01/03/2021: Sinus tachycardia at a rate of 104 bpm, left axis deviation, left anterior fascicular block.  Poor R wave progression, cannot exclude anterolateral infarct old.  ST-T abnormality, cannot  exclude high lateral ischemia.  No significant change from prior EKG on 01/01/2021.  EKG 01/01/2021: Normal sinus rhythm at rate of 93 bpm, left axis deviation, left anterior fascicular block.  Poor R wave progression, cannot exclude anteroseptal infarct old.  ST-T wave abnormality, lateral ischemia.  Compared to 02/25/2020, ST depressions in 1 and aVL and V5 V6 is new.   Scheduled Meds:  aspirin  81 mg Oral Daily   atorvastatin  80 mg Oral QHS   Chlorhexidine Gluconate Cloth  6 each Topical Daily   FLUoxetine  40 mg Oral Daily   gabapentin  300 mg Oral Daily  insulin aspart  0-20 Units Subcutaneous TID WC   insulin aspart  0-5 Units Subcutaneous QHS   insulin aspart protamine- aspart  50 Units Subcutaneous BID WC   metoprolol tartrate  25 mg Oral BID   sodium chloride flush  3 mL Intravenous Q12H   traZODone  50 mg Oral QHS   Continuous Infusions:  sodium chloride 250 mL (01/02/21 1415)   sodium chloride 250 mL (01/02/21 1827)   sodium chloride     heparin 1,100 Units/hr (01/04/21 0004)   Assessment/Plan:  1.  NSTEMI with recurrence of chest pain this morning and abdominal discomfort which is anginal equivalent. 2.  Acute systolic heart failure 3.  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 4.  Uncontrolled diabetes mellitus on insulin with acute on chronic kidney disease stage IIIb.  Recommendation: Patient awaiting CABG.  Had abdominal discomfort suggestive of angina pectoris and this morning she is still having chest discomfort that started earlier.  She has been evaluated by CT surgery and in view of ongoing chest pain, she has been scheduled for CABG today. Husband present and we extensively discussed regarding her coronary anatomy, need for lifestyle modification, need for cardiac rehab.  Heart failure symptoms are stable, she is now able to lay down flat without dyspnea.   She is already on high-dose statin,  continue the same.  She is not on an ACE inhibitor or an ARB due to contrast exposure, low blood pressure and cardiogenic shock however we will try to reinitiate this once she is hemodynamically stable.    I spent 30 minutes in review of the chart, discussion with the patient and her husband and greater than 50% of the time was spent with face-to-face encounter.   Adrian Prows, MD, [facility] 01/04/2021, 7:38 AM Office: [phone] Fax: [phone] Pager: [phone]

## 2021-01-04 NOTE — Progress Notes (Signed)
CARDIAC REHAB PHASE I   Checked on pt. Pt for OR this afternoon, c/o consistent epigastric pain/heartburn. Pt and family deny questions or concerns regarding yesterdays education. Provided support and encouragement. Will continue to follow throughout her stay.  [id number] Reynold Bowen, RN BSN 01/04/2021 11:12 AM

## 2021-01-04 NOTE — Progress Notes (Signed)
ANTICOAGULATION CONSULT NOTE - Follow Up Consult  Pharmacy Consult for Heparin Indication: chest pain/ACS  No Known Allergies  Patient Measurements: Height: 5\' 3"  (160 cm) Weight: 85.6 kg (188 lb 12.8 oz) IBW/kg (Calculated) : 52.4 Heparin Dosing Weight: 71.7   Vital Signs: Temp: 98.6 F (37 C) (09/21 0443) Temp Source: Oral (09/21 0443) BP: 96/67 (09/21 0443) Pulse Rate: 79 (09/21 0443)  Labs: Recent Labs    01/02/21 1141 01/02/21 1432 01/02/21 1433 01/02/21 1437 01/03/21 0153 01/03/21 0737 01/03/21 1953 01/04/21 0328  HGB  --    < > 12.2 12.6  --   --   --  12.0  HCT  --    < > 36.0 37.0  --   --   --  36.9  PLT  --   --   --   --   --   --   --  229  HEPARINUNFRC  --   --   --   --   --  0.23* 0.32 0.31  CREATININE 1.21*  --   --   --  0.85  --   --  1.00   < > = values in this interval not displayed.     Estimated Creatinine Clearance: 59.7 mL/min (by C-G formula based on SCr of 1 mg/dL).   Medical History: Past Medical History:  Diagnosis Date   Acute anterior wall MI (HCC) 10/04/2015   Acute MI anterior wall first episode care [facility]) 10/04/2015   Coronary angiogram 10/04/2015: Occluded proximal LAD, superior takeoff of ramus intermediate, large circumflex, moderate to large RCA with mild diffuse disease. S/P thrombectomy, overlapping stents to the proximal LAD with 3.5 x 18, 3.5 x 12 mm Xience DES, 100% to 0%.    CAD (coronary artery disease), native coronary artery 10/11/2015   Coronary angiogram 10/04/2015: Left main mild calcification. LAD tortuous origin, occluded in the proximal segment. Diagonal 2 is moderate-sized, has proximal 50% stenosis. S/P thrombectomy & DES overlapping 3.5 x 18 and 3.5 x 12 mm Xience Alpine stents,   Ramus intermediate has a tortuous origin. Circumflex mild disease. Right coronary artery mild diffuse disease   Colitis    DM (diabetes mellitus), type 2, uncontrolled with complications (HCC) 06/12/2018   H/O tobacco use, presenting  hazards to health 06/12/2018   Quit in 2017 after MI   Hypercholesteremia 06/12/2018    Medications:  Infusions:   sodium chloride 250 mL (01/02/21 1415)   sodium chloride 250 mL (01/02/21 1827)   sodium chloride     heparin 1,100 Units/hr (01/04/21 0004)    Assessment: 65 yo F presenting with acute on chronic HF. R/LHC on 9/19 showing severe distal LM stenosis and compensated ischemic cardiomyopathy. No documented anticoagulant prior to admission.  On IV heparin while awaiting CABG assessment. No bleeding or infusion issues noted. Heparin level at the low end of therapeutic this morning at 0.31 units/ml on heparin @1100  units/hr.   Goal of Therapy:  Heparin level 0.3-0.7 units/ml Monitor platelets by anticoagulation protocol: Yes   Plan:  Increase heparin slightly to 1150 units/hr  Check anti-Xa level daily while on heparin Continue to monitor H&H and platelets   Thank you for allowing 10/19 to participate in this patients care. , PharmD 01/04/2021 7:17 AM  **Pharmacist phone directory can be found on [email or web address] listed under [facility]**

## 2021-01-05 ENCOUNTER — Encounter (HOSPITAL_COMMUNITY)
Admission: AD | Disposition: A | Discharge status: Home / Self Care | Payer: Self-pay | Source: Other Acute Inpatient Hospital | Attending: Thoracic Surgery (Cardiothoracic Vascular Surgery)

## 2021-01-05 ENCOUNTER — Inpatient Hospital Stay (HOSPITAL_COMMUNITY): Payer: Medicare Other

## 2021-01-05 DIAGNOSIS — I200 Unstable angina: Secondary | ICD-10-CM

## 2021-01-05 DIAGNOSIS — R570 Cardiogenic shock: Secondary | ICD-10-CM

## 2021-01-05 HISTORY — PX: IABP INSERTION: CATH118242

## 2021-01-05 LAB — GLUCOSE, CAPILLARY
Glucose-Capillary: 115 mg/dL — ABNORMAL HIGH (ref 70–99)
Glucose-Capillary: 244 mg/dL — ABNORMAL HIGH (ref 70–99)
Glucose-Capillary: 119 mg/dL — ABNORMAL HIGH (ref 70–99)
Glucose-Capillary: 223 mg/dL — ABNORMAL HIGH (ref 70–99)

## 2021-01-05 LAB — CBC
HCT: 34.4 % — ABNORMAL LOW (ref 36.0–46.0)
HCT: 34.6 % — ABNORMAL LOW (ref 36.0–46.0)
Hemoglobin: 11.3 g/dL — ABNORMAL LOW (ref 12.0–15.0)
Hemoglobin: 11.5 g/dL — ABNORMAL LOW (ref 12.0–15.0)
MCH: 30.3 pg (ref 26.0–34.0)
MCH: 30.3 pg (ref 26.0–34.0)
MCHC: 32.8 g/dL (ref 30.0–36.0)
MCHC: 33.2 g/dL (ref 30.0–36.0)
MCV: 91.3 fL (ref 80.0–100.0)
MCV: 92.2 fL (ref 80.0–100.0)
Platelets: 211 K/uL (ref 150–400)
Platelets: 214 10*3/uL (ref 150–400)
RBC: 3.73 MIL/uL — ABNORMAL LOW (ref 3.87–5.11)
RBC: 3.79 MIL/uL — ABNORMAL LOW (ref 3.87–5.11)
RDW: 13 % (ref 11.5–15.5)
RDW: 13.2 % (ref 11.5–15.5)
WBC: 8 K/uL (ref 4.0–10.5)
WBC: 9.7 10*3/uL (ref 4.0–10.5)
nRBC: 0 % (ref 0.0–0.2)
nRBC: 0 % (ref 0.0–0.2)

## 2021-01-05 LAB — LACTIC ACID, PLASMA
Lactic Acid, Venous: 0.9 mmol/L (ref 0.5–1.9)
Lactic Acid, Venous: 1.2 mmol/L (ref 0.5–1.9)

## 2021-01-05 LAB — BASIC METABOLIC PANEL
Anion gap: 8 (ref 5–15)
Anion gap: 6 (ref 5–15)
BUN: 33 mg/dL — ABNORMAL HIGH (ref 8–23)
BUN: 22 mg/dL (ref 8–23)
CO2: 25 mmol/L (ref 22–32)
CO2: 26 mmol/L (ref 22–32)
Calcium: 8.9 mg/dL (ref 8.9–10.3)
Calcium: 8.9 mg/dL (ref 8.9–10.3)
Chloride: 102 mmol/L (ref 98–111)
Chloride: 102 mmol/L (ref 98–111)
Creatinine, Ser: 1.26 mg/dL — ABNORMAL HIGH (ref 0.44–1.00)
Creatinine, Ser: 0.91 mg/dL (ref 0.44–1.00)
GFR, Estimated: 48 mL/min — ABNORMAL LOW (ref >60)
GFR, Estimated: >60 mL/min (ref >60)
Glucose, Bld: 92 mg/dL (ref 70–99)
Glucose, Bld: 196 mg/dL — ABNORMAL HIGH (ref 70–99)
Potassium: 4.3 mmol/L (ref 3.5–5.1)
Potassium: 4.5 mmol/L (ref 3.5–5.1)
Sodium: 135 mmol/L (ref 135–145)
Sodium: 134 mmol/L — ABNORMAL LOW (ref 135–145)

## 2021-01-05 LAB — HEPARIN LEVEL (UNFRACTIONATED): Heparin Unfractionated: 0.25 IU/mL — ABNORMAL LOW (ref 0.30–0.70)

## 2021-01-05 SURGERY — IABP INSERTION
Anesthesia: LOCAL

## 2021-01-05 MED ORDER — HEPARIN SODIUM (PORCINE) 1000 UNIT/ML IJ SOLN
INTRAMUSCULAR | Status: DC
Start: 2021-01-06 — End: 2021-01-06
  Filled 2021-01-05: qty 5

## 2021-01-05 MED ORDER — FENTANYL CITRATE (PF) 100 MCG/2ML IJ SOLN
INTRAMUSCULAR | Status: DC | PRN
  Administered 2021-01-05: 25 ug via INTRAVENOUS

## 2021-01-05 MED ORDER — LIDOCAINE HCL (PF) 1 % IJ SOLN
INTRAMUSCULAR | Status: DC | PRN
  Administered 2021-01-05: 15 mL

## 2021-01-05 MED ORDER — PLASMA-LYTE A IV SOLN
INTRAVENOUS | Status: DC
  Filled 2021-01-05: qty 5

## 2021-01-05 MED ORDER — CEFAZOLIN SODIUM-DEXTROSE 2-4 GM/100ML-% IV SOLN
2.0000 g | INTRAVENOUS | Status: AC
  Administered 2021-01-06 (×2): 2 g via INTRAVENOUS
  Filled 2021-01-05: qty 100

## 2021-01-05 MED ORDER — SODIUM CHLORIDE 0.9% FLUSH: 3.0000 mL | INTRAVENOUS | Status: DC | PRN

## 2021-01-05 MED ORDER — EPINEPHRINE HCL 5 MG/250ML IV SOLN IN NS
0.0000 ug/min | INTRAVENOUS | Status: DC
  Filled 2021-01-05: qty 250

## 2021-01-05 MED ORDER — CEFAZOLIN SODIUM-DEXTROSE 2-4 GM/100ML-% IV SOLN
2.0000 g | INTRAVENOUS | Status: DC
Start: 2021-01-06 — End: 2021-01-05
  Filled 2021-01-05: qty 100

## 2021-01-05 MED ORDER — NITROGLYCERIN IN D5W 200-5 MCG/ML-% IV SOLN
2.0000 ug/min | INTRAVENOUS | Status: AC
  Administered 2021-01-06: 10 ug/min via INTRAVENOUS
  Filled 2021-01-05: qty 250

## 2021-01-05 MED ORDER — SODIUM CHLORIDE 0.9% FLUSH
3.0000 mL | Freq: Two times a day (BID) | INTRAVENOUS | Status: DC
  Administered 2021-01-05: 3 mL via INTRAVENOUS

## 2021-01-05 MED ORDER — MIDAZOLAM HCL 2 MG/2ML IJ SOLN
INTRAMUSCULAR | Status: DC | PRN
  Administered 2021-01-05: 2 mg via INTRAVENOUS

## 2021-01-05 MED ORDER — CEFAZOLIN SODIUM-DEXTROSE 2-4 GM/100ML-% IV SOLN
2.0000 g | INTRAVENOUS | Status: DC
  Filled 2021-01-05: qty 100

## 2021-01-05 MED ORDER — NOREPINEPHRINE 4 MG/250ML-% IV SOLN
0.0000 ug/min | INTRAVENOUS | Status: AC
  Administered 2021-01-06: 1 ug/min via INTRAVENOUS
  Filled 2021-01-05: qty 250

## 2021-01-05 MED ORDER — VANCOMYCIN HCL 1500 MG/300ML IV SOLN
1500.0000 mg | INTRAVENOUS | Status: DC
Start: 2021-01-06 — End: 2021-01-05
  Filled 2021-01-05: qty 300

## 2021-01-05 MED ORDER — HEPARIN (PORCINE) IN NACL 1000-0.9 UT/500ML-% IV SOLN
INTRAVENOUS | Status: DC | PRN
  Administered 2021-01-05: 500 mL

## 2021-01-05 MED ORDER — DEXMEDETOMIDINE HCL IN NACL 400 MCG/100ML IV SOLN
0.1000 ug/kg/h | INTRAVENOUS | Status: AC
Start: 2021-01-06 — End: 2021-01-07
  Administered 2021-01-06: .5 ug/kg/h via INTRAVENOUS
  Filled 2021-01-05: qty 100

## 2021-01-05 MED ORDER — LIDOCAINE HCL (PF) 1 % IJ SOLN
INTRAMUSCULAR | Status: AC
  Filled 2021-01-05: qty 30

## 2021-01-05 MED ORDER — SODIUM CHLORIDE 0.9 % IV SOLN
250.0000 mL | INTRAVENOUS | Status: DC
Start: 2021-01-05 — End: 2021-01-06

## 2021-01-05 MED ORDER — MILRINONE LACTATE IN DEXTROSE 20-5 MG/100ML-% IV SOLN
0.3000 ug/kg/min | INTRAVENOUS | Status: DC
  Filled 2021-01-05: qty 100

## 2021-01-05 MED ORDER — SODIUM CHLORIDE 0.9 % IV SOLN: INTRAVENOUS | Status: DC

## 2021-01-05 MED ORDER — MAGNESIUM SULFATE 50 % IJ SOLN
40.0000 meq | INTRAMUSCULAR | Status: DC
  Filled 2021-01-05: qty 9.85

## 2021-01-05 MED ORDER — TRANEXAMIC ACID (OHS) PUMP PRIME SOLUTION
2.0000 mg/kg | INTRAVENOUS | Status: DC
  Filled 2021-01-05: qty 1.72

## 2021-01-05 MED ORDER — NITROGLYCERIN 0.4 MG SL SUBL: 0.4000 mg | SUBLINGUAL_TABLET | SUBLINGUAL | Status: DC | PRN

## 2021-01-05 MED ORDER — DEXMEDETOMIDINE HCL IN NACL 400 MCG/100ML IV SOLN
0.1000 ug/kg/h | INTRAVENOUS | Status: DC
  Filled 2021-01-05: qty 100

## 2021-01-05 MED ORDER — TRANEXAMIC ACID (OHS) BOLUS VIA INFUSION
15.0000 mg/kg | INTRAVENOUS | Status: DC
Start: 2021-01-06 — End: 2021-01-05
  Filled 2021-01-05: qty 1289

## 2021-01-05 MED ORDER — TRANEXAMIC ACID (OHS) BOLUS VIA INFUSION
15.0000 mg/kg | INTRAVENOUS | Status: AC
  Administered 2021-01-06: 1288.5 mg via INTRAVENOUS
  Filled 2021-01-05: qty 1289

## 2021-01-05 MED ORDER — NOREPINEPHRINE 4 MG/250ML-% IV SOLN
0.0000 ug/min | INTRAVENOUS | Status: DC
  Filled 2021-01-05: qty 250

## 2021-01-05 MED ORDER — HEPARIN 30,000 UNITS/1000 ML (OHS) CELLSAVER SOLUTION
Status: DC
  Filled 2021-01-05: qty 1000

## 2021-01-05 MED ORDER — MANNITOL 20 % IV SOLN
INTRAVENOUS | Status: DC
  Filled 2021-01-05: qty 13

## 2021-01-05 MED ORDER — INSULIN REGULAR(HUMAN) IN NACL 100-0.9 UT/100ML-% IV SOLN
INTRAVENOUS | Status: AC
  Administered 2021-01-06: 10.5 [IU]/h via INTRAVENOUS
  Filled 2021-01-05: qty 100

## 2021-01-05 MED ORDER — PHENYLEPHRINE HCL-NACL 20-0.9 MG/250ML-% IV SOLN
30.0000 ug/min | INTRAVENOUS | Status: DC
  Filled 2021-01-05: qty 250

## 2021-01-05 MED ORDER — FENTANYL CITRATE (PF) 100 MCG/2ML IJ SOLN
INTRAMUSCULAR | Status: AC
  Filled 2021-01-05: qty 2

## 2021-01-05 MED ORDER — TRANEXAMIC ACID 1000 MG/10ML IV SOLN
1.5000 mg/kg/h | INTRAVENOUS | Status: AC
  Administered 2021-01-06: 1.5 mg/kg/h via INTRAVENOUS
  Filled 2021-01-05: qty 25

## 2021-01-05 MED ORDER — POTASSIUM CHLORIDE 2 MEQ/ML IV SOLN
80.0000 meq | INTRAVENOUS | Status: DC
  Filled 2021-01-05: qty 40

## 2021-01-05 MED ORDER — INSULIN REGULAR(HUMAN) IN NACL 100-0.9 UT/100ML-% IV SOLN
INTRAVENOUS | Status: DC
Start: 2021-01-06 — End: 2021-01-05
  Filled 2021-01-05: qty 100

## 2021-01-05 MED ORDER — MILRINONE LACTATE IN DEXTROSE 20-5 MG/100ML-% IV SOLN
0.3000 ug/kg/min | INTRAVENOUS | Status: DC
Start: 2021-01-06 — End: 2021-01-05
  Filled 2021-01-05: qty 100

## 2021-01-05 MED ORDER — MIDAZOLAM HCL 2 MG/2ML IJ SOLN
INTRAMUSCULAR | Status: AC
  Filled 2021-01-05: qty 2

## 2021-01-05 MED ORDER — TRANEXAMIC ACID 1000 MG/10ML IV SOLN
1.5000 mg/kg/h | INTRAVENOUS | Status: DC
  Filled 2021-01-05: qty 25

## 2021-01-05 MED ORDER — VANCOMYCIN HCL 1500 MG/300ML IV SOLN
1500.0000 mg | INTRAVENOUS | Status: AC
  Administered 2021-01-06: 1500 mg via INTRAVENOUS
  Filled 2021-01-05: qty 300

## 2021-01-05 MED ORDER — ORAL CARE MOUTH RINSE
15.0000 mL | Freq: Two times a day (BID) | OROMUCOSAL | Status: DC
  Administered 2021-01-05: 15 mL via OROMUCOSAL

## 2021-01-05 MED ORDER — NITROGLYCERIN IN D5W 200-5 MCG/ML-% IV SOLN
2.0000 ug/min | INTRAVENOUS | Status: DC
  Filled 2021-01-05: qty 250

## 2021-01-05 MED ORDER — NOREPINEPHRINE 4 MG/250ML-% IV SOLN
2.0000 ug/min | INTRAVENOUS | Status: DC
  Administered 2021-01-05: 2 ug/min via INTRAVENOUS

## 2021-01-05 MED ORDER — SODIUM CHLORIDE 0.9 % IV SOLN: 250.0000 mL | INTRAVENOUS | Status: DC | PRN

## 2021-01-05 MED FILL — Tranexamic Acid IV Soln 1000 MG/10ML (100 MG/ML): INTRAVENOUS | Qty: 1000 | Status: AC

## 2021-01-05 MED FILL — Heparin Sodium (Porcine) Inj 1000 Unit/ML: INTRAMUSCULAR | Qty: 5000 | Status: AC

## 2021-01-05 MED FILL — Lidocaine HCl Local Preservative Free (PF) Inj 2%: INTRAMUSCULAR | Qty: 15 | Status: AC

## 2021-01-05 MED FILL — Heparin Sodium (Porcine) Inj 1000 Unit/ML: Qty: 1000 | Status: AC

## 2021-01-05 SURGICAL SUPPLY — 9 items
BALLN IABP SENSA PLUS 7.5F 40C (BALLOONS) ×2
BALLOON IABP SENS PLUS 7.5F40C (BALLOONS) IMPLANT
KIT MICROPUNCTURE NIT STIFF (SHEATH) ×1 IMPLANT
MAT PREVALON FULL STRYKER (MISCELLANEOUS) ×1 IMPLANT
PACK CARDIAC CATHETERIZATION (CUSTOM PROCEDURE TRAY) ×2 IMPLANT
SHEATH PROBE COVER 6X72 (BAG) ×1 IMPLANT
TRANSDUCER W/MONITORING KIT (MISCELLANEOUS) ×1 IMPLANT
TUBING ART PRESS 72  MALE/FEM (TUBING) ×2
TUBING ART PRESS 72 MALE/FEM (TUBING) IMPLANT

## 2021-01-05 NOTE — Progress Notes (Signed)
Patient continues to have SBP in the 80s; unable to titrate NTG gtt for CP due to low BP.  Have been giving pain medications to help control CP as well.  Notified Dr. Evert Kohl who immediately came to patients bedside.  Orders received to initiate levophed gtt in order to titrate NTG for CP control.  IV watch placed on left AC IV for levophed infusion.  Pt to go for IABP this afternoon.  Dr. Cliffton Asters updated.

## 2021-01-05 NOTE — Anesthesia Preprocedure Evaluation (Addendum)
Anesthesia Evaluation  Patient identified by MRN, date of birth, ID band Patient awake    Reviewed: Allergy & Precautions, NPO status , Patient's Chart, lab work & pertinent test results  History of Anesthesia Complications Negative for: history of anesthetic complications  Airway Mallampati: III  TM Distance: >3 FB Neck ROM: Full    Dental no notable dental hx. (+) Dental Advisory Given   Pulmonary neg pulmonary ROS, former smoker,    Pulmonary exam normal        Cardiovascular + CAD and +CHF  Normal cardiovascular exam  Narrative LM: Distal LM bifurcation calcific 95% stenosis (1,0,1) LAD: Patent ostial LAD stent    Mid diffuse 30% disease Lcx: Ostial calcific 80% stenosis RCA: Mid 20% disease  Severe distal LM stenosis (1,0,1) Compensated ischemic cardiomyopathy   Neuro/Psych negative neurological ROS     GI/Hepatic negative GI ROS, Neg liver ROS,   Endo/Other  diabetes  Renal/GU negative Renal ROS     Musculoskeletal negative musculoskeletal ROS (+)   Abdominal   Peds  Hematology negative hematology ROS (+)   Anesthesia Other Findings   Reproductive/Obstetrics                            Anesthesia Physical Anesthesia Plan  ASA: 4  Anesthesia Plan: General   Post-op Pain Management:    Induction: Intravenous  PONV Risk Score and Plan: 4 or greater and Ondansetron, Midazolam, Dexamethasone and Treatment may vary due to age or medical condition  Airway Management Planned: Oral ETT  Additional Equipment: Arterial line, 3D TEE, PA Cath and Ultrasound Guidance Line Placement  Intra-op Plan:   Post-operative Plan: Post-operative intubation/ventilation  Informed Consent: I have reviewed the patients History and Physical, chart, labs and discussed the procedure including the risks, benefits and alternatives for the proposed anesthesia with the patient or authorized  representative who has indicated his/her understanding and acceptance.     Dental advisory given  Plan Discussed with: Anesthesiologist, CRNA and Surgeon  Anesthesia Plan Comments:        Anesthesia Quick Evaluation

## 2021-01-05 NOTE — Progress Notes (Signed)
ANTICOAGULATION CONSULT NOTE - Follow Up Consult  Pharmacy Consult for Heparin Indication: chest pain/ACS  No Known Allergies  Patient Measurements: Height: 5\' 3"  (160 cm) Weight: 85.9 kg (189 lb 6 oz) IBW/kg (Calculated) : 52.4 Heparin Dosing Weight: 71.7   Vital Signs: Temp: 98.2 F (36.8 C) (09/22 0820) Temp Source: Oral (09/22 0820) BP: 76/40 (09/22 1623) Pulse Rate: 72 (09/22 1622)  Labs: Recent Labs    01/03/21 0153 01/03/21 0737 01/03/21 1953 01/04/21 0328 01/05/21 0054  HGB  --   --   --  12.0 11.3*  HCT  --   --   --  36.9 34.4*  PLT  --   --   --  229 211  HEPARINUNFRC  --    < > 0.32 0.31 0.25*  CREATININE 0.85  --   --  1.00 1.26*   < > = values in this interval not displayed.     Estimated Creatinine Clearance: 47.5 mL/min (A) (by C-G formula based on SCr of 1.26 mg/dL (H)).   Medical History: Past Medical History:  Diagnosis Date   Acute anterior wall MI (HCC) 10/04/2015   Acute MI anterior wall first episode care [facility]) 10/04/2015   Coronary angiogram 10/04/2015: Occluded proximal LAD, superior takeoff of ramus intermediate, large circumflex, moderate to large RCA with mild diffuse disease. S/P thrombectomy, overlapping stents to the proximal LAD with 3.5 x 18, 3.5 x 12 mm Xience DES, 100% to 0%.    CAD (coronary artery disease), native coronary artery 10/11/2015   Coronary angiogram 10/04/2015: Left main mild calcification. LAD tortuous origin, occluded in the proximal segment. Diagonal 2 is moderate-sized, has proximal 50% stenosis. S/P thrombectomy & DES overlapping 3.5 x 18 and 3.5 x 12 mm Xience Alpine stents,   Ramus intermediate has a tortuous origin. Circumflex mild disease. Right coronary artery mild diffuse disease   Colitis    DM (diabetes mellitus), type 2, uncontrolled with complications (HCC) 06/12/2018   H/O tobacco use, presenting hazards to health 06/12/2018   Quit in 2017 after MI   Hypercholesteremia 06/12/2018    Medications:   Infusions:   sodium chloride 250 mL (01/02/21 1415)   sodium chloride 250 mL (01/02/21 1827)   sodium chloride     sodium chloride     heparin 1,250 Units/hr (01/05/21 1641)   nitroGLYCERIN 5 mcg/min (01/05/21 1200)   norepinephrine (LEVOPHED) Adult infusion 2 mcg/min (01/05/21 1340)    Assessment: 65 yo F presenting with acute on chronic HF. R/LHC on 9/19 showing severe distal LM stenosis and compensated ischemic cardiomyopathy. No documented anticoagulant prior to admission.  On IV heparin while awaiting CABG assessment. She is now s/p IABP and heparin was restarted in cath lab.  Goal of Therapy:  Heparin level 0.3-05 Monitor platelets by anticoagulation protocol: Yes   Plan:  -Continue heparin at 1250 units/hr -Heparin level in 6 hours and daily wth CBC daily  03-08-1988, PharmD Clinical Pharmacist **Pharmacist phone directory can now be found on [email or web address] ([email or web address]).  Listed under [facility].

## 2021-01-05 NOTE — Interval H&P Note (Signed)
History and Physical Interval Note:  01/05/2021 3:20 PM  Sonia Morales  has presented today for surgery, with the diagnosis of chest pain.  The various methods of treatment have been discussed with the patient and family. After consideration of risks, benefits and other options for treatment, the patient has consented to  Procedure(s): IABP INSERTION (N/A) as a surgical intervention.  The patient's history has been reviewed, patient examined, no change in status, stable for surgery.  I have reviewed the patient's chart and labs.  Questions were answered to the patient's satisfaction.     Yates Decamp

## 2021-01-05 NOTE — Progress Notes (Signed)
Subjective:  Had an episode of emotional distress while bathing this morning Has had chest pain since then  MAPS around 60 mmHg Awaiting surgery on 01/06/2021  Objective:  Vital Signs in the last 24 hours: Temp:  [97.9 F (36.6 C)-98.3 F (36.8 C)] 98.2 F (36.8 C) (09/22 0820) Pulse Rate:  [74-107] 76 (09/22 1300) Resp:  [11-26] 15 (09/22 1300) BP: (77-119)/(38-99) 85/50 (09/22 1300) SpO2:  [87 %-99 %] 92 % (09/22 1300) Weight:  [85.9 kg] 85.9 kg (09/22 0500)  Intake/Output from previous day: 09/21 0701 - 09/22 0700 In: 644.1 [P.O.:118; I.V.:526.1] Out: 700 [Urine:700]   Physical Exam Vitals and nursing note reviewed.  Constitutional:      General: She is not in acute distress.    Appearance: She is well-developed.  HENT:     Head: Normocephalic and atraumatic.  Eyes:     Conjunctiva/sclera: Conjunctivae normal.     Pupils: Pupils are equal, round, and reactive to light.  Neck:     Vascular: No JVD.  Cardiovascular:     Rate and Rhythm: Normal rate and regular rhythm.     Pulses: Normal pulses and intact distal pulses.     Heart sounds: No murmur heard. Pulmonary:     Effort: Pulmonary effort is normal.     Breath sounds: Normal breath sounds. No wheezing or rales.  Abdominal:     General: Bowel sounds are normal.     Palpations: Abdomen is soft.     Tenderness: There is no rebound.  Musculoskeletal:        General: No tenderness. Normal range of motion.     Right lower leg: No edema.     Left lower leg: No edema.  Lymphadenopathy:     Cervical: No cervical adenopathy.  Skin:    General: Skin is warm and dry.  Neurological:     Mental Status: She is alert and oriented to person, place, and time.     Cranial Nerves: No cranial nerve deficit.     Lab Results: BMP Recent Labs    01/03/21 0153 01/04/21 0328 01/05/21 0054  NA 134* 136 135  K 4.0 4.6 4.3  CL 98 102 102  CO2 25 21* 25  GLUCOSE 195* 92 92  BUN 32* 39* 33*  CREATININE 0.85 1.00 1.26*   CALCIUM 9.4 9.2 8.9  GFRNONAA >60 >60 48*     CBC Recent Labs  Lab 12/31/20 2115 01/02/21 1432 01/05/21 0054  WBC 10.8*   < > 8.0  RBC 3.91   < > 3.73*  HGB 11.7*   < > 11.3*  HCT 36.3   < > 34.4*  PLT 234   < > 211  MCV 92.8   < > 92.2  MCH 29.9   < > 30.3  MCHC 32.2   < > 32.8  RDW 13.9   < > 13.2  LYMPHSABS 1.8  --   --   MONOABS 0.7  --   --   EOSABS 0.0  --   --   BASOSABS 0.0  --   --    < > = values in this interval not displayed.     HEMOGLOBIN A1C Lab Results  Component Value Date   HGBA1C 8.9 (H) 12/31/2020   MPG 208.73 12/31/2020    Cardiac Panel (last 3 results) (9/16-9/17 2022): HS Trop 175-->312-->261-->171 Pro BNP 2505-->4098  Lipid Panel     Component Value Date/Time   CHOL 115 12/31/2020 2115   CHOL 121  01/13/2019 1349   TRIG 84 12/31/2020 2115   HDL 46 12/31/2020 2115   HDL 46 01/13/2019 1349   CHOLHDL 2.5 12/31/2020 2115   VLDL 17 12/31/2020 2115   LDLCALC 52 12/31/2020 2115   LDLCALC 59 01/13/2019 1349   LDLDIRECT 57 01/13/2019 1349     Hepatic Function Panel Recent Labs    12/31/20 2115  PROT 6.6  ALBUMIN 3.5  AST 18  ALT 19  ALKPHOS 59  BILITOT 0.6    Chest Xray 01/01/2021: 1. Mild pulmonary interstitial edema is not significantly changed. 2. The left costophrenic angle is not imaged; a left pleural effusion can therefore not be excluded.    EKG 12/30/2020: Sinus rhythm Old anterior infarct Later T wave inversion, consider ischemia   Echocardiogram 03/29/2020:  Mildly depressed LV systolic function with visual EF 45-50%. Left  ventricle cavity is normal in size. Normal global wall motion. Doppler  evidence of grade I (impaired) diastolic dysfunction, elevated LAP.  No significant valvular abnormalities.  Compared to prior study dated 04/14/2019 no significant change.   Lexiscan Myoview Stress Test 12/29/2018: Lexiscan stress test was performed. Stress EKG is non-diagnostic, as this is pharmacological stress  test. Perfusion images reveal a very large size anterior, anteroseptal, apical and apical inferior transmural scar with moderate amount of ischemia especially at the basal septal region from base to mid ventricle.  Dynamic gated images reveal anterior, anteroseptal and apical akinesis. Stress LV EF is severely dysfunctional 29%.  High risk study. No prior studies for comparision.   Coronary angiogram 10/04/2015:  Left main mild calcification. LAD tortuous origin, occluded in the proximal segment. Diagonal 2 is moderate-sized, has proximal 50% stenosis. S/P thrombectomy & DES overlapping 3.5 x 18 and 3.5 x 12 mm Xience Alpine stents, 100% reduced to 0%. Ramus intermediate has a tortuous origin. Circumflex mild disease. Right coronary artery mild diffuse disease. Markedly elevated LVEDP, 31 mmHg.   Carotid artery duplex 08/30/2020:  Stenosis in the right internal carotid artery (50-69%). Stenosis in the  right external carotid artery (<50%).  Stenosis in the left internal carotid artery (16-49%). Stenosis in the  left external carotid artery (<50%).  Antegrade right vertebral artery flow. Antegrade left vertebral artery  flow.  No significant change since 02/05/2020. Follow up in six months is  appropriate if clinically indicated.       Assessment/Plan   64 y.o. Caucasian female  with prior hypertension, hyperlipidemia, uncontrolled type 2 DM, CAD ( MI, LAD PCI 2017), ischemic cardiomyopathy, admitted with acute on chronic systolic heart failure   Acute on chronic systolic heart failure: Initial presentation with acute hypoxic respiratory failure, pulmonary edema Multivessel CAD. MAP around 60 mmHg. Impending shock. Also contributed by her baseline pain meds. Will start norepinehrine to allow for use of nitroglycerin. Check lactic acid. Monitor I/O. (360 cc on bladder scan at this time). If lactic acid elevated, MAP stay <55-60 mmHg, if chest pain continues, if UOP <50 cc/hr, will need to  consider IABP placement as temporizing measure until CABG tomorrow.  Her initial RHC did not suggest that she will need any higher support with Impella etc  CAD: Prior MI and LAD PCI 2017. I suspect she may have some further obstructive CAD now. HS trop 100s-300s, likely type 2 MI. Continue Aspirin, statin, nitro.  Coreg on hold due to acute decompensated heart failure.  CABG on 9/23.  AKI: Likely pre-renal due to hypotension  Monitor urine output Avoid nephrotoxic agents  Uncontrolled DM: A1C 8.9% On  70-30 insulin at home, reportedly takes 40 U bid, but has notation regarding 50 U in am and 60 U in on, if BG>90. Will use the latter dosing given her hyperglycemia. Additional HS correctional insulin, as needed    Abdominal pain: Resolved  CRITICAL CARE Performed by: Truett Mainland   Total critical care time: 35 minutes   Critical care time was exclusive of separately billable procedures and treating other patients.   Critical care was necessary to treat or prevent imminent or life-threatening deterioration.   Critical care was time spent personally by me on the following activities: development of treatment plan with patient and/or surrogate as well as nursing, discussions with consultants, evaluation of patient's response to treatment, examination of patient, obtaining history from patient or surrogate, ordering and performing treatments and interventions, ordering and review of laboratory studies, ordering and review of radiographic studies, pulse oximetry and re-evaluation of patient's condition.       Elder Negus, MD Pager: [phone] Office: [phone]

## 2021-01-05 NOTE — Progress Notes (Signed)
ANTICOAGULATION CONSULT NOTE - Follow Up Consult  Pharmacy Consult for Heparin Indication: chest pain/ACS  No Known Allergies  Patient Measurements: Height: 5\' 3"  (160 cm) Weight: 85.9 kg (189 lb 6 oz) IBW/kg (Calculated) : 52.4 Heparin Dosing Weight: 71.7   Vital Signs: Temp: 98.2 F (36.8 C) (09/22 0820) Temp Source: Oral (09/22 0820) BP: 97/61 (09/22 0820) Pulse Rate: 79 (09/22 0820)  Labs: Recent Labs    01/02/21 1437 01/03/21 0153 01/03/21 0737 01/03/21 1953 01/04/21 0328 01/05/21 0054  HGB 12.6  --   --   --  12.0 11.3*  HCT 37.0  --   --   --  36.9 34.4*  PLT  --   --   --   --  229 211  HEPARINUNFRC  --   --    < > 0.32 0.31 0.25*  CREATININE  --  0.85  --   --  1.00 1.26*   < > = values in this interval not displayed.     Estimated Creatinine Clearance: 47.5 mL/min (A) (by C-G formula based on SCr of 1.26 mg/dL (H)).   Medical History: Past Medical History:  Diagnosis Date   Acute anterior wall MI (HCC) 10/04/2015   Acute MI anterior wall first episode care [facility]) 10/04/2015   Coronary angiogram 10/04/2015: Occluded proximal LAD, superior takeoff of ramus intermediate, large circumflex, moderate to large RCA with mild diffuse disease. S/P thrombectomy, overlapping stents to the proximal LAD with 3.5 x 18, 3.5 x 12 mm Xience DES, 100% to 0%.    CAD (coronary artery disease), native coronary artery 10/11/2015   Coronary angiogram 10/04/2015: Left main mild calcification. LAD tortuous origin, occluded in the proximal segment. Diagonal 2 is moderate-sized, has proximal 50% stenosis. S/P thrombectomy & DES overlapping 3.5 x 18 and 3.5 x 12 mm Xience Alpine stents,   Ramus intermediate has a tortuous origin. Circumflex mild disease. Right coronary artery mild diffuse disease   Colitis    DM (diabetes mellitus), type 2, uncontrolled with complications (HCC) 06/12/2018   H/O tobacco use, presenting hazards to health 06/12/2018   Quit in 2017 after MI   Hypercholesteremia  06/12/2018    Medications:  Infusions:   sodium chloride 250 mL (01/02/21 1415)   sodium chloride 250 mL (01/02/21 1827)   sodium chloride      ceFAZolin (ANCEF) IV      ceFAZolin (ANCEF) IV     [START ON 01/06/2021]  ceFAZolin (ANCEF) IV     [START ON 01/06/2021]  ceFAZolin (ANCEF) IV     dexmedetomidine     [START ON 01/06/2021] dexmedetomidine     heparin 30,000 units/NS 1000 mL solution for CELLSAVER     [START ON 01/06/2021] heparin 30,000 units/NS 1000 mL solution for CELLSAVER     heparin 1,150 Units/hr (01/05/21 0700)   milrinone     [START ON 01/06/2021] milrinone     nitroGLYCERIN 5 mcg/min (01/05/21 0700)   nitroGLYCERIN     [START ON 01/06/2021] nitroGLYCERIN     norepinephrine     [START ON 01/06/2021] norepinephrine     tranexamic acid (CYKLOKAPRON) infusion ([facility])     [START ON 01/06/2021] tranexamic acid (CYKLOKAPRON) infusion ([facility])     vancomycin     [START ON 01/06/2021] vancomycin      Assessment: 65 yo F presenting with acute on chronic HF. R/LHC on 9/19 showing severe distal LM stenosis and compensated ischemic cardiomyopathy. No documented anticoagulant prior to admission.  On IV heparin while awaiting  CABG assessment. Heparin level slightly below goal at 0.25, CBC stable.  Goal of Therapy:  Heparin level 0.3-0.7 units/ml Monitor platelets by anticoagulation protocol: Yes   Plan:  Increase heparin to 1250 units/h Daily heparin level and CBC  Fredonia Highland, PharmD, [place], [facility] Clinical Pharmacist [phone] Please check AMION for all [facility] Pharmacy numbers 01/05/2021

## 2021-01-05 NOTE — H&P (View-Only) (Signed)
Subjective:  Had an episode of emotional distress while bathing this morning Has had chest pain since then  MAPS around 60 mmHg Awaiting surgery on 01/06/2021  Objective:  Vital Signs in the last 24 hours: Temp:  [97.9 F (36.6 C)-98.3 F (36.8 C)] 98.2 F (36.8 C) (09/22 0820) Pulse Rate:  [74-107] 76 (09/22 1300) Resp:  [11-26] 15 (09/22 1300) BP: (77-119)/(38-99) 85/50 (09/22 1300) SpO2:  [87 %-99 %] 92 % (09/22 1300) Weight:  [85.9 kg] 85.9 kg (09/22 0500)  Intake/Output from previous day: 09/21 0701 - 09/22 0700 In: 644.1 [P.O.:118; I.V.:526.1] Out: 700 [Urine:700]   Physical Exam Vitals and nursing note reviewed.  Constitutional:      General: She is not in acute distress.    Appearance: She is well-developed.  HENT:     Head: Normocephalic and atraumatic.  Eyes:     Conjunctiva/sclera: Conjunctivae normal.     Pupils: Pupils are equal, round, and reactive to light.  Neck:     Vascular: No JVD.  Cardiovascular:     Rate and Rhythm: Normal rate and regular rhythm.     Pulses: Normal pulses and intact distal pulses.     Heart sounds: No murmur heard. Pulmonary:     Effort: Pulmonary effort is normal.     Breath sounds: Normal breath sounds. No wheezing or rales.  Abdominal:     General: Bowel sounds are normal.     Palpations: Abdomen is soft.     Tenderness: There is no rebound.  Musculoskeletal:        General: No tenderness. Normal range of motion.     Right lower leg: No edema.     Left lower leg: No edema.  Lymphadenopathy:     Cervical: No cervical adenopathy.  Skin:    General: Skin is warm and dry.  Neurological:     Mental Status: She is alert and oriented to person, place, and time.     Cranial Nerves: No cranial nerve deficit.     Lab Results: BMP Recent Labs    01/03/21 0153 01/04/21 0328 01/05/21 0054  NA 134* 136 135  K 4.0 4.6 4.3  CL 98 102 102  CO2 25 21* 25  GLUCOSE 195* 92 92  BUN 32* 39* 33*  CREATININE 0.85 1.00 1.26*   CALCIUM 9.4 9.2 8.9  GFRNONAA >60 >60 48*     CBC Recent Labs  Lab 12/31/20 2115 01/02/21 1432 01/05/21 0054  WBC 10.8*   < > 8.0  RBC 3.91   < > 3.73*  HGB 11.7*   < > 11.3*  HCT 36.3   < > 34.4*  PLT 234   < > 211  MCV 92.8   < > 92.2  MCH 29.9   < > 30.3  MCHC 32.2   < > 32.8  RDW 13.9   < > 13.2  LYMPHSABS 1.8  --   --   MONOABS 0.7  --   --   EOSABS 0.0  --   --   BASOSABS 0.0  --   --    < > = values in this interval not displayed.     HEMOGLOBIN A1C Lab Results  Component Value Date   HGBA1C 8.9 (H) 12/31/2020   MPG 208.73 12/31/2020    Cardiac Panel (last 3 results) (9/16-9/17 2022): HS Trop 175-->312-->261-->171 Pro BNP 2505-->4098  Lipid Panel     Component Value Date/Time   CHOL 115 12/31/2020 2115   CHOL 121   01/13/2019 1349   TRIG 84 12/31/2020 2115   HDL 46 12/31/2020 2115   HDL 46 01/13/2019 1349   CHOLHDL 2.5 12/31/2020 2115   VLDL 17 12/31/2020 2115   LDLCALC 52 12/31/2020 2115   LDLCALC 59 01/13/2019 1349   LDLDIRECT 57 01/13/2019 1349     Hepatic Function Panel Recent Labs    12/31/20 2115  PROT 6.6  ALBUMIN 3.5  AST 18  ALT 19  ALKPHOS 59  BILITOT 0.6    Chest Xray 01/01/2021: 1. Mild pulmonary interstitial edema is not significantly changed. 2. The left costophrenic angle is not imaged; a left pleural effusion can therefore not be excluded.    EKG 12/30/2020: Sinus rhythm Old anterior infarct Later T wave inversion, consider ischemia   Echocardiogram 03/29/2020:  Mildly depressed LV systolic function with visual EF 45-50%. Left  ventricle cavity is normal in size. Normal global wall motion. Doppler  evidence of grade I (impaired) diastolic dysfunction, elevated LAP.  No significant valvular abnormalities.  Compared to prior study dated 04/14/2019 no significant change.   Lexiscan Myoview Stress Test 12/29/2018: Lexiscan stress test was performed. Stress EKG is non-diagnostic, as this is pharmacological stress  test. Perfusion images reveal a very large size anterior, anteroseptal, apical and apical inferior transmural scar with moderate amount of ischemia especially at the basal septal region from base to mid ventricle.  Dynamic gated images reveal anterior, anteroseptal and apical akinesis. Stress LV EF is severely dysfunctional 29%.  High risk study. No prior studies for comparision.   Coronary angiogram 10/04/2015:  Left main mild calcification. LAD tortuous origin, occluded in the proximal segment. Diagonal 2 is moderate-sized, has proximal 50% stenosis. S/P thrombectomy & DES overlapping 3.5 x 18 and 3.5 x 12 mm Xience Alpine stents, 100% reduced to 0%. Ramus intermediate has a tortuous origin. Circumflex mild disease. Right coronary artery mild diffuse disease. Markedly elevated LVEDP, 31 mmHg.   Carotid artery duplex 08/30/2020:  Stenosis in the right internal carotid artery (50-69%). Stenosis in the  right external carotid artery (<50%).  Stenosis in the left internal carotid artery (16-49%). Stenosis in the  left external carotid artery (<50%).  Antegrade right vertebral artery flow. Antegrade left vertebral artery  flow.  No significant change since 02/05/2020. Follow up in six months is  appropriate if clinically indicated.       Assessment/Plan   64 y.o. Caucasian female  with prior hypertension, hyperlipidemia, uncontrolled type 2 DM, CAD ( MI, LAD PCI 2017), ischemic cardiomyopathy, admitted with acute on chronic systolic heart failure   Acute on chronic systolic heart failure: Initial presentation with acute hypoxic respiratory failure, pulmonary edema Multivessel CAD. MAP around 60 mmHg. Impending shock. Also contributed by her baseline pain meds. Will start norepinehrine to allow for use of nitroglycerin. Check lactic acid. Monitor I/O. (360 cc on bladder scan at this time). If lactic acid elevated, MAP stay <55-60 mmHg, if chest pain continues, if UOP <50 cc/hr, will need to  consider IABP placement as temporizing measure until CABG tomorrow.  Her initial RHC did not suggest that she will need any higher support with Impella etc  CAD: Prior MI and LAD PCI 2017. I suspect she may have some further obstructive CAD now. HS trop 100s-300s, likely type 2 MI. Continue Aspirin, statin, nitro.  Coreg on hold due to acute decompensated heart failure.  CABG on 9/23.  AKI: Likely pre-renal due to hypotension  Monitor urine output Avoid nephrotoxic agents  Uncontrolled DM: A1C 8.9% On  70-30 insulin at home, reportedly takes 40 U bid, but has notation regarding 50 U in am and 60 U in on, if BG>90. Will use the latter dosing given her hyperglycemia. Additional HS correctional insulin, as needed    Abdominal pain: Resolved  CRITICAL CARE Performed by: Kasheena Sambrano   Total critical care time: 35 minutes   Critical care time was exclusive of separately billable procedures and treating other patients.   Critical care was necessary to treat or prevent imminent or life-threatening deterioration.   Critical care was time spent personally by me on the following activities: development of treatment plan with patient and/or surrogate as well as nursing, discussions with consultants, evaluation of patient's response to treatment, examination of patient, obtaining history from patient or surrogate, ordering and performing treatments and interventions, ordering and review of laboratory studies, ordering and review of radiographic studies, pulse oximetry and re-evaluation of patient's condition.       Shloimy Michalski J Atley Scarboro, MD Pager: [phone] Office: [phone]  

## 2021-01-05 NOTE — Progress Notes (Signed)
Notified Dr. Jacinto Halim that IABP transduced pressure was dampened and fiberoptic pressures were appropriate on IABP.  CXR ordered for placement. Dr. Jacinto Halim to bedside, no further orders given.

## 2021-01-06 ENCOUNTER — Inpatient Hospital Stay (HOSPITAL_COMMUNITY): Payer: Medicare Other

## 2021-01-06 ENCOUNTER — Inpatient Hospital Stay (HOSPITAL_COMMUNITY): Payer: Medicare Other | Admitting: Certified Registered Nurse Anesthetist

## 2021-01-06 ENCOUNTER — Other Ambulatory Visit: Payer: Self-pay

## 2021-01-06 ENCOUNTER — Encounter (HOSPITAL_COMMUNITY): Payer: Self-pay | Admitting: Cardiology

## 2021-01-06 ENCOUNTER — Inpatient Hospital Stay (HOSPITAL_COMMUNITY)
Admission: AD | Disposition: A | Discharge status: Home / Self Care | Payer: Self-pay | Source: Other Acute Inpatient Hospital | Attending: Thoracic Surgery (Cardiothoracic Vascular Surgery)

## 2021-01-06 DIAGNOSIS — Z951 Presence of aortocoronary bypass graft: Secondary | ICD-10-CM

## 2021-01-06 DIAGNOSIS — I25110 Atherosclerotic heart disease of native coronary artery with unstable angina pectoris: Secondary | ICD-10-CM | POA: Diagnosis not present

## 2021-01-06 DIAGNOSIS — I214 Non-ST elevation (NSTEMI) myocardial infarction: Secondary | ICD-10-CM

## 2021-01-06 HISTORY — PX: CORONARY ARTERY BYPASS GRAFT: SHX141

## 2021-01-06 HISTORY — PX: TEE WITHOUT CARDIOVERSION: SHX5443

## 2021-01-06 LAB — POCT I-STAT 7, (LYTES, BLD GAS, ICA,H+H)
Acid-Base Excess: 3 mmol/L — ABNORMAL HIGH (ref 0.0–2.0)
Acid-Base Excess: 0 mmol/L (ref 0.0–2.0)
Acid-base deficit: 3 mmol/L — ABNORMAL HIGH (ref 0.0–2.0)
Acid-base deficit: 4 mmol/L — ABNORMAL HIGH (ref 0.0–2.0)
Acid-base deficit: 2 mmol/L (ref 0.0–2.0)
Bicarbonate: 26.4 mmol/L (ref 20.0–28.0)
Bicarbonate: 24.1 mmol/L (ref 20.0–28.0)
Bicarbonate: 23 mmol/L (ref 20.0–28.0)
Bicarbonate: 21.4 mmol/L (ref 20.0–28.0)
Bicarbonate: 23.2 mmol/L (ref 20.0–28.0)
Calcium, Ion: 1.03 mmol/L — ABNORMAL LOW (ref 1.15–1.40)
Calcium, Ion: 1.34 mmol/L (ref 1.15–1.40)
Calcium, Ion: 1.29 mmol/L (ref 1.15–1.40)
Calcium, Ion: 1.19 mmol/L (ref 1.15–1.40)
Calcium, Ion: 1.19 mmol/L (ref 1.15–1.40)
HCT: 24 % — ABNORMAL LOW (ref 36.0–46.0)
HCT: 23 % — ABNORMAL LOW (ref 36.0–46.0)
HCT: 25 % — ABNORMAL LOW (ref 36.0–46.0)
HCT: 25 % — ABNORMAL LOW (ref 36.0–46.0)
HCT: 27 % — ABNORMAL LOW (ref 36.0–46.0)
Hemoglobin: 8.2 g/dL — ABNORMAL LOW (ref 12.0–15.0)
Hemoglobin: 7.8 g/dL — ABNORMAL LOW (ref 12.0–15.0)
Hemoglobin: 8.5 g/dL — ABNORMAL LOW (ref 12.0–15.0)
Hemoglobin: 8.5 g/dL — ABNORMAL LOW (ref 12.0–15.0)
Hemoglobin: 9.2 g/dL — ABNORMAL LOW (ref 12.0–15.0)
O2 Saturation: 100 %
O2 Saturation: 100 %
O2 Saturation: 95 %
O2 Saturation: 93 %
O2 Saturation: 89 %
Patient temperature: 36.4
Patient temperature: 36.7
Patient temperature: 37.4
Potassium: 4 mmol/L (ref 3.5–5.1)
Potassium: 4.5 mmol/L (ref 3.5–5.1)
Potassium: 3.6 mmol/L (ref 3.5–5.1)
Potassium: 3.8 mmol/L (ref 3.5–5.1)
Potassium: 4.1 mmol/L (ref 3.5–5.1)
Sodium: 136 mmol/L (ref 135–145)
Sodium: 135 mmol/L (ref 135–145)
Sodium: 139 mmol/L (ref 135–145)
Sodium: 140 mmol/L (ref 135–145)
Sodium: 136 mmol/L (ref 135–145)
TCO2: 27 mmol/L (ref 22–32)
TCO2: 25 mmol/L (ref 22–32)
TCO2: 24 mmol/L (ref 22–32)
TCO2: 23 mmol/L (ref 22–32)
TCO2: 24 mmol/L (ref 22–32)
pCO2 arterial: 36 mmHg (ref 32.0–48.0)
pCO2 arterial: 36.4 mmHg (ref 32.0–48.0)
pCO2 arterial: 42 mmHg (ref 32.0–48.0)
pCO2 arterial: 40.2 mmHg (ref 32.0–48.0)
pCO2 arterial: 42 mmHg (ref 32.0–48.0)
pH, Arterial: 7.474 — ABNORMAL HIGH (ref 7.350–7.450)
pH, Arterial: 7.429 (ref 7.350–7.450)
pH, Arterial: 7.344 — ABNORMAL LOW (ref 7.350–7.450)
pH, Arterial: 7.331 — ABNORMAL LOW (ref 7.350–7.450)
pH, Arterial: 7.352 (ref 7.350–7.450)
pO2, Arterial: 317 mmHg — ABNORMAL HIGH (ref 83.0–108.0)
pO2, Arterial: 243 mmHg — ABNORMAL HIGH (ref 83.0–108.0)
pO2, Arterial: 80 mmHg — ABNORMAL LOW (ref 83.0–108.0)
pO2, Arterial: 71 mmHg — ABNORMAL LOW (ref 83.0–108.0)
pO2, Arterial: 61 mmHg — ABNORMAL LOW (ref 83.0–108.0)

## 2021-01-06 LAB — POCT I-STAT EG7
Acid-Base Excess: 0 mmol/L (ref 0.0–2.0)
Bicarbonate: 24.7 mmol/L (ref 20.0–28.0)
Calcium, Ion: 1.03 mmol/L — ABNORMAL LOW (ref 1.15–1.40)
HCT: 21 % — ABNORMAL LOW (ref 36.0–46.0)
Hemoglobin: 7.1 g/dL — ABNORMAL LOW (ref 12.0–15.0)
O2 Saturation: 76 %
Potassium: 5.1 mmol/L (ref 3.5–5.1)
Sodium: 134 mmol/L — ABNORMAL LOW (ref 135–145)
TCO2: 26 mmol/L (ref 22–32)
pCO2, Ven: 38.8 mmHg — ABNORMAL LOW (ref 44.0–60.0)
pH, Ven: 7.411 (ref 7.250–7.430)
pO2, Ven: 40 mmHg (ref 32.0–45.0)

## 2021-01-06 LAB — BASIC METABOLIC PANEL
Anion gap: 7 (ref 5–15)
Anion gap: 8 (ref 5–15)
BUN: 23 mg/dL (ref 8–23)
BUN: 14 mg/dL (ref 8–23)
CO2: 26 mmol/L (ref 22–32)
CO2: 20 mmol/L — ABNORMAL LOW (ref 22–32)
Calcium: 9 mg/dL (ref 8.9–10.3)
Calcium: 8.2 mg/dL — ABNORMAL LOW (ref 8.9–10.3)
Chloride: 101 mmol/L (ref 98–111)
Chloride: 106 mmol/L (ref 98–111)
Creatinine, Ser: 0.81 mg/dL (ref 0.44–1.00)
Creatinine, Ser: 0.73 mg/dL (ref 0.44–1.00)
GFR, Estimated: >60 mL/min (ref >60)
GFR, Estimated: >60 mL/min (ref >60)
Glucose, Bld: 257 mg/dL — ABNORMAL HIGH (ref 70–99)
Glucose, Bld: 127 mg/dL — ABNORMAL HIGH (ref 70–99)
Potassium: 4.4 mmol/L (ref 3.5–5.1)
Potassium: 4.3 mmol/L (ref 3.5–5.1)
Sodium: 134 mmol/L — ABNORMAL LOW (ref 135–145)
Sodium: 134 mmol/L — ABNORMAL LOW (ref 135–145)

## 2021-01-06 LAB — CBC
HCT: 28.6 % — ABNORMAL LOW (ref 36.0–46.0)
HCT: 29.5 % — ABNORMAL LOW (ref 36.0–46.0)
Hemoglobin: 9.5 g/dL — ABNORMAL LOW (ref 12.0–15.0)
Hemoglobin: 9.8 g/dL — ABNORMAL LOW (ref 12.0–15.0)
MCH: 30.8 pg (ref 26.0–34.0)
MCH: 30.7 pg (ref 26.0–34.0)
MCHC: 33.2 g/dL (ref 30.0–36.0)
MCHC: 33.2 g/dL (ref 30.0–36.0)
MCV: 92.9 fL (ref 80.0–100.0)
MCV: 92.5 fL (ref 80.0–100.0)
Platelets: 128 10*3/uL — ABNORMAL LOW (ref 150–400)
Platelets: 144 10*3/uL — ABNORMAL LOW (ref 150–400)
RBC: 3.08 MIL/uL — ABNORMAL LOW (ref 3.87–5.11)
RBC: 3.19 MIL/uL — ABNORMAL LOW (ref 3.87–5.11)
RDW: 13.2 % (ref 11.5–15.5)
RDW: 13.2 % (ref 11.5–15.5)
WBC: 9.4 10*3/uL (ref 4.0–10.5)
WBC: 12.5 10*3/uL — ABNORMAL HIGH (ref 4.0–10.5)
nRBC: 0 % (ref 0.0–0.2)
nRBC: 0 % (ref 0.0–0.2)

## 2021-01-06 LAB — PROTIME-INR
INR: 1.3 — ABNORMAL HIGH (ref 0.8–1.2)
Prothrombin Time: 16.5 seconds — ABNORMAL HIGH (ref 11.4–15.2)

## 2021-01-06 LAB — POCT I-STAT, CHEM 8
BUN: 19 mg/dL (ref 8–23)
BUN: 17 mg/dL (ref 8–23)
BUN: 17 mg/dL (ref 8–23)
BUN: 17 mg/dL (ref 8–23)
Calcium, Ion: 1.27 mmol/L (ref 1.15–1.40)
Calcium, Ion: 1.2 mmol/L (ref 1.15–1.40)
Calcium, Ion: 1 mmol/L — ABNORMAL LOW (ref 1.15–1.40)
Calcium, Ion: 1.33 mmol/L (ref 1.15–1.40)
Chloride: 102 mmol/L (ref 98–111)
Chloride: 102 mmol/L (ref 98–111)
Chloride: 101 mmol/L (ref 98–111)
Chloride: 100 mmol/L (ref 98–111)
Creatinine, Ser: 0.7 mg/dL (ref 0.44–1.00)
Creatinine, Ser: 0.6 mg/dL (ref 0.44–1.00)
Creatinine, Ser: 0.6 mg/dL (ref 0.44–1.00)
Creatinine, Ser: 0.6 mg/dL (ref 0.44–1.00)
Glucose, Bld: 209 mg/dL — ABNORMAL HIGH (ref 70–99)
Glucose, Bld: 229 mg/dL — ABNORMAL HIGH (ref 70–99)
Glucose, Bld: 180 mg/dL — ABNORMAL HIGH (ref 70–99)
Glucose, Bld: 197 mg/dL — ABNORMAL HIGH (ref 70–99)
HCT: 34 % — ABNORMAL LOW (ref 36.0–46.0)
HCT: 31 % — ABNORMAL LOW (ref 36.0–46.0)
HCT: 22 % — ABNORMAL LOW (ref 36.0–46.0)
HCT: 23 % — ABNORMAL LOW (ref 36.0–46.0)
Hemoglobin: 11.6 g/dL — ABNORMAL LOW (ref 12.0–15.0)
Hemoglobin: 10.5 g/dL — ABNORMAL LOW (ref 12.0–15.0)
Hemoglobin: 7.5 g/dL — ABNORMAL LOW (ref 12.0–15.0)
Hemoglobin: 7.8 g/dL — ABNORMAL LOW (ref 12.0–15.0)
Potassium: 4.5 mmol/L (ref 3.5–5.1)
Potassium: 4.2 mmol/L (ref 3.5–5.1)
Potassium: 5.8 mmol/L — ABNORMAL HIGH (ref 3.5–5.1)
Potassium: 4.5 mmol/L (ref 3.5–5.1)
Sodium: 135 mmol/L (ref 135–145)
Sodium: 136 mmol/L (ref 135–145)
Sodium: 134 mmol/L — ABNORMAL LOW (ref 135–145)
Sodium: 134 mmol/L — ABNORMAL LOW (ref 135–145)
TCO2: 26 mmol/L (ref 22–32)
TCO2: 26 mmol/L (ref 22–32)
TCO2: 25 mmol/L (ref 22–32)
TCO2: 24 mmol/L (ref 22–32)

## 2021-01-06 LAB — ECHO INTRAOPERATIVE TEE
AR max vel: 2.08 cm2
AV Area VTI: 2.04 cm2
AV Area mean vel: 1.94 cm2
AV Mean grad: 2 mmHg
AV Peak grad: 5 mmHg
Ao pk vel: 1.12 m/s
Area-P 1/2: 3.74 cm2
Height: 63 in
MV VTI: 1.74 cm2
Weight: 3030 oz

## 2021-01-06 LAB — GLUCOSE, CAPILLARY
Glucose-Capillary: 101 mg/dL — ABNORMAL HIGH (ref 70–99)
Glucose-Capillary: 107 mg/dL — ABNORMAL HIGH (ref 70–99)
Glucose-Capillary: 109 mg/dL — ABNORMAL HIGH (ref 70–99)
Glucose-Capillary: 112 mg/dL — ABNORMAL HIGH (ref 70–99)
Glucose-Capillary: 119 mg/dL — ABNORMAL HIGH (ref 70–99)
Glucose-Capillary: 127 mg/dL — ABNORMAL HIGH (ref 70–99)
Glucose-Capillary: 130 mg/dL — ABNORMAL HIGH (ref 70–99)
Glucose-Capillary: 138 mg/dL — ABNORMAL HIGH (ref 70–99)
Glucose-Capillary: 145 mg/dL — ABNORMAL HIGH (ref 70–99)
Glucose-Capillary: 148 mg/dL — ABNORMAL HIGH (ref 70–99)
Glucose-Capillary: 194 mg/dL — ABNORMAL HIGH (ref 70–99)
Glucose-Capillary: 197 mg/dL — ABNORMAL HIGH (ref 70–99)

## 2021-01-06 LAB — LACTIC ACID, PLASMA: Lactic Acid, Venous: 0.8 mmol/L (ref 0.5–1.9)

## 2021-01-06 LAB — HEMOGLOBIN AND HEMATOCRIT, BLOOD
HCT: 21 % — ABNORMAL LOW (ref 36.0–46.0)
Hemoglobin: 6.9 g/dL — CL (ref 12.0–15.0)

## 2021-01-06 LAB — MAGNESIUM: Magnesium: 2.6 mg/dL — ABNORMAL HIGH (ref 1.7–2.4)

## 2021-01-06 LAB — APTT: aPTT: 32 seconds (ref 24–36)

## 2021-01-06 LAB — PLATELET COUNT: Platelets: 121 10*3/uL — ABNORMAL LOW (ref 150–400)

## 2021-01-06 LAB — HEPARIN LEVEL (UNFRACTIONATED): Heparin Unfractionated: 0.28 IU/mL — ABNORMAL LOW (ref 0.30–0.70)

## 2021-01-06 SURGERY — CORONARY ARTERY BYPASS GRAFTING (CABG)
Anesthesia: General | Site: Chest

## 2021-01-06 MED ORDER — METOPROLOL TARTRATE 25 MG/10 ML ORAL SUSPENSION: 12.5000 mg | Freq: Two times a day (BID) | ORAL | Status: DC

## 2021-01-06 MED ORDER — PROTAMINE SULFATE 10 MG/ML IV SOLN
INTRAVENOUS | Status: DC | PRN
  Administered 2021-01-06: 260 mg via INTRAVENOUS

## 2021-01-06 MED ORDER — ETOMIDATE 2 MG/ML IV SOLN
INTRAVENOUS | Status: AC
  Filled 2021-01-06: qty 10

## 2021-01-06 MED ORDER — DEXTROSE 50 % IV SOLN: 0.0000 mL | INTRAVENOUS | Status: DC | PRN

## 2021-01-06 MED ORDER — CHLORHEXIDINE GLUCONATE 0.12 % MT SOLN
15.0000 mL | OROMUCOSAL | Status: AC
  Administered 2021-01-06: 15 mL via OROMUCOSAL

## 2021-01-06 MED ORDER — SODIUM CHLORIDE 0.9% FLUSH: 3.0000 mL | INTRAVENOUS | Status: DC | PRN

## 2021-01-06 MED ORDER — FAMOTIDINE 20 MG IN NS 100 ML IVPB
20.0000 mg | Freq: Two times a day (BID) | INTRAVENOUS | Status: AC
  Administered 2021-01-06 (×2): 20 mg via INTRAVENOUS
  Filled 2021-01-06 (×2): qty 100

## 2021-01-06 MED ORDER — DOBUTAMINE IN D5W 4-5 MG/ML-% IV SOLN: 0.0000 ug/kg/min | INTRAVENOUS | Status: DC

## 2021-01-06 MED ORDER — SODIUM CHLORIDE 0.9 % IV SOLN: INTRAVENOUS | Status: DC | PRN

## 2021-01-06 MED ORDER — FENTANYL CITRATE (PF) 250 MCG/5ML IJ SOLN
INTRAMUSCULAR | Status: AC
  Filled 2021-01-06: qty 5

## 2021-01-06 MED ORDER — PROTAMINE SULFATE 10 MG/ML IV SOLN
INTRAVENOUS | Status: AC
  Filled 2021-01-06: qty 25

## 2021-01-06 MED ORDER — SODIUM CHLORIDE 0.9 % IV SOLN
INTRAVENOUS | Status: DC
  Administered 2021-01-06: 10 mL via INTRAVENOUS

## 2021-01-06 MED ORDER — MORPHINE SULFATE (PF) 2 MG/ML IV SOLN
1.0000 mg | INTRAVENOUS | Status: DC | PRN
Start: 2021-01-06 — End: 2021-01-12
  Administered 2021-01-06: 4 mg via INTRAVENOUS
  Administered 2021-01-06: 2 mg via INTRAVENOUS
  Administered 2021-01-06: 1 mg via INTRAVENOUS
  Administered 2021-01-07 (×3): 4 mg via INTRAVENOUS
  Administered 2021-01-07 – 2021-01-08 (×2): 2 mg via INTRAVENOUS
  Administered 2021-01-10: 1 mg via INTRAVENOUS
  Administered 2021-01-10: 2 mg via INTRAVENOUS
  Administered 2021-01-11: 4 mg via INTRAVENOUS
  Filled 2021-01-06: qty 1
  Filled 2021-01-06: qty 2
  Filled 2021-01-06 (×3): qty 1
  Filled 2021-01-06 (×2): qty 2
  Filled 2021-01-06 (×2): qty 1
  Filled 2021-01-06 (×2): qty 2

## 2021-01-06 MED ORDER — METOPROLOL TARTRATE 12.5 MG HALF TABLET
12.5000 mg | ORAL_TABLET | Freq: Two times a day (BID) | ORAL | Status: DC
  Administered 2021-01-07 – 2021-01-12 (×7): 12.5 mg via ORAL
  Filled 2021-01-06 (×9): qty 1

## 2021-01-06 MED ORDER — METOPROLOL TARTRATE 5 MG/5ML IV SOLN: 2.5000 mg | INTRAVENOUS | Status: DC | PRN

## 2021-01-06 MED ORDER — MIDAZOLAM HCL (PF) 5 MG/ML IJ SOLN
INTRAMUSCULAR | Status: DC | PRN
  Administered 2021-01-06 (×2): 2 mg via INTRAVENOUS
  Administered 2021-01-06: 3 mg via INTRAVENOUS
  Administered 2021-01-06: 2 mg via INTRAVENOUS
  Administered 2021-01-06: 1 mg via INTRAVENOUS
  Administered 2021-01-06: 2 mg via INTRAVENOUS

## 2021-01-06 MED ORDER — ONDANSETRON HCL 4 MG/2ML IJ SOLN: 4.0000 mg | Freq: Four times a day (QID) | INTRAMUSCULAR | Status: DC | PRN

## 2021-01-06 MED ORDER — BISACODYL 10 MG RE SUPP
10.0000 mg | Freq: Every day | RECTAL | Status: DC
  Administered 2021-01-08: 10 mg via RECTAL
  Filled 2021-01-06 (×2): qty 1

## 2021-01-06 MED ORDER — HEPARIN SODIUM (PORCINE) 1000 UNIT/ML IJ SOLN
INTRAMUSCULAR | Status: AC
  Filled 2021-01-06: qty 1

## 2021-01-06 MED ORDER — PLASMA-LYTE A IV SOLN
INTRAVENOUS | Status: DC | PRN
  Administered 2021-01-06: 1000 mL

## 2021-01-06 MED ORDER — SODIUM CHLORIDE 0.9 % IV SOLN: 250.0000 mL | INTRAVENOUS | Status: DC

## 2021-01-06 MED ORDER — PROPOFOL 10 MG/ML IV BOLUS
INTRAVENOUS | Status: DC | PRN
  Administered 2021-01-06 (×3): 50 mg via INTRAVENOUS

## 2021-01-06 MED ORDER — SODIUM CHLORIDE (PF) 0.9 % IJ SOLN
OROMUCOSAL | Status: DC | PRN
  Administered 2021-01-06: 12 mL via TOPICAL

## 2021-01-06 MED ORDER — MAGNESIUM SULFATE 4 GM/100ML IV SOLN
4.0000 g | Freq: Once | INTRAVENOUS | Status: AC
  Administered 2021-01-06: 4 g via INTRAVENOUS
  Filled 2021-01-06: qty 100

## 2021-01-06 MED ORDER — SODIUM CHLORIDE 0.9% FLUSH
3.0000 mL | Freq: Two times a day (BID) | INTRAVENOUS | Status: DC
  Administered 2021-01-07 – 2021-01-09 (×4): 3 mL via INTRAVENOUS
  Administered 2021-01-10: 10 mL via INTRAVENOUS
  Administered 2021-01-11 – 2021-01-12 (×3): 3 mL via INTRAVENOUS

## 2021-01-06 MED ORDER — LACTATED RINGERS IV SOLN: 500.0000 mL | Freq: Once | INTRAVENOUS | Status: DC | PRN

## 2021-01-06 MED ORDER — PROPOFOL 10 MG/ML IV BOLUS
INTRAVENOUS | Status: AC
  Filled 2021-01-06: qty 20

## 2021-01-06 MED ORDER — VANCOMYCIN HCL IN DEXTROSE 1-5 GM/200ML-% IV SOLN
1000.0000 mg | Freq: Once | INTRAVENOUS | Status: AC
  Administered 2021-01-06: 1000 mg via INTRAVENOUS
  Filled 2021-01-06: qty 200

## 2021-01-06 MED ORDER — SUCCINYLCHOLINE CHLORIDE 200 MG/10ML IV SOSY
PREFILLED_SYRINGE | INTRAVENOUS | Status: DC | PRN
  Administered 2021-01-06: 140 mg via INTRAVENOUS

## 2021-01-06 MED ORDER — ACETAMINOPHEN 160 MG/5ML PO SOLN
1000.0000 mg | Freq: Four times a day (QID) | ORAL | Status: DC
  Administered 2021-01-07: 1000 mg

## 2021-01-06 MED ORDER — MIDAZOLAM HCL 2 MG/2ML IJ SOLN
INTRAMUSCULAR | Status: AC
  Filled 2021-01-06: qty 2

## 2021-01-06 MED ORDER — PANTOPRAZOLE SODIUM 40 MG PO TBEC
40.0000 mg | DELAYED_RELEASE_TABLET | Freq: Every day | ORAL | Status: DC
  Administered 2021-01-08 – 2021-01-12 (×5): 40 mg via ORAL
  Filled 2021-01-06 (×5): qty 1

## 2021-01-06 MED ORDER — NITROGLYCERIN IN D5W 200-5 MCG/ML-% IV SOLN
0.0000 ug/min | INTRAVENOUS | Status: DC
Start: 2021-01-06 — End: 2021-01-08

## 2021-01-06 MED ORDER — ASPIRIN 81 MG PO CHEW: 324.0000 mg | CHEWABLE_TABLET | Freq: Every day | ORAL | Status: DC

## 2021-01-06 MED ORDER — ALBUMIN HUMAN 5 % IV SOLN
250.0000 mL | INTRAVENOUS | Status: AC | PRN
Start: 2021-01-06 — End: 2021-01-07
  Administered 2021-01-06 – 2021-01-07 (×4): 12.5 g via INTRAVENOUS
  Filled 2021-01-06 (×2): qty 250

## 2021-01-06 MED ORDER — ACETAMINOPHEN 650 MG RE SUPP: 650.0000 mg | Freq: Once | RECTAL | Status: AC

## 2021-01-06 MED ORDER — 0.9 % SODIUM CHLORIDE (POUR BTL) OPTIME
TOPICAL | Status: DC | PRN
  Administered 2021-01-06: 5000 mL

## 2021-01-06 MED ORDER — TRAMADOL HCL 50 MG PO TABS
50.0000 mg | ORAL_TABLET | ORAL | Status: DC | PRN
  Administered 2021-01-07 – 2021-01-12 (×8): 100 mg via ORAL
  Filled 2021-01-06 (×8): qty 2

## 2021-01-06 MED ORDER — NICARDIPINE HCL IN NACL 20-0.86 MG/200ML-% IV SOLN: 0.0000 mg/h | INTRAVENOUS | Status: DC

## 2021-01-06 MED ORDER — FENTANYL CITRATE (PF) 250 MCG/5ML IJ SOLN
INTRAMUSCULAR | Status: AC
  Filled 2021-01-06: qty 25

## 2021-01-06 MED ORDER — ROCURONIUM BROMIDE 10 MG/ML (PF) SYRINGE
PREFILLED_SYRINGE | INTRAVENOUS | Status: DC | PRN
  Administered 2021-01-06: 50 mg via INTRAVENOUS
  Administered 2021-01-06: 100 mg via INTRAVENOUS
  Administered 2021-01-06: 50 mg via INTRAVENOUS

## 2021-01-06 MED ORDER — INSULIN REGULAR(HUMAN) IN NACL 100-0.9 UT/100ML-% IV SOLN
INTRAVENOUS | Status: DC
  Administered 2021-01-06: 3.4 [IU]/h via INTRAVENOUS
  Administered 2021-01-07: 1.3 [IU]/h via INTRAVENOUS
  Filled 2021-01-06: qty 100

## 2021-01-06 MED ORDER — ASPIRIN EC 325 MG PO TBEC
325.0000 mg | DELAYED_RELEASE_TABLET | Freq: Every day | ORAL | Status: DC
  Administered 2021-01-07 – 2021-01-09 (×3): 325 mg via ORAL
  Filled 2021-01-06 (×3): qty 1

## 2021-01-06 MED ORDER — LIDOCAINE 2% (20 MG/ML) 5 ML SYRINGE
INTRAMUSCULAR | Status: DC | PRN
  Administered 2021-01-06: 100 mg via INTRAVENOUS

## 2021-01-06 MED ORDER — LACTATED RINGERS IV SOLN: INTRAVENOUS | Status: DC | PRN

## 2021-01-06 MED ORDER — BISACODYL 5 MG PO TBEC
10.0000 mg | DELAYED_RELEASE_TABLET | Freq: Every day | ORAL | Status: DC
  Administered 2021-01-07 – 2021-01-12 (×4): 10 mg via ORAL
  Filled 2021-01-06 (×5): qty 2

## 2021-01-06 MED ORDER — MIDAZOLAM HCL 2 MG/2ML IJ SOLN: 2.0000 mg | INTRAMUSCULAR | Status: DC | PRN

## 2021-01-06 MED ORDER — OXYCODONE HCL 5 MG PO TABS
5.0000 mg | ORAL_TABLET | ORAL | Status: DC | PRN
  Administered 2021-01-06 – 2021-01-08 (×7): 10 mg via ORAL
  Administered 2021-01-08: 5 mg via ORAL
  Administered 2021-01-08: 10 mg via ORAL
  Administered 2021-01-09 – 2021-01-10 (×3): 5 mg via ORAL
  Administered 2021-01-10 – 2021-01-11 (×5): 10 mg via ORAL
  Administered 2021-01-12: 5 mg via ORAL
  Administered 2021-01-12: 10 mg via ORAL
  Filled 2021-01-06: qty 1
  Filled 2021-01-06 (×4): qty 2
  Filled 2021-01-06: qty 1
  Filled 2021-01-06 (×5): qty 2
  Filled 2021-01-06 (×2): qty 1
  Filled 2021-01-06 (×3): qty 2
  Filled 2021-01-06: qty 1
  Filled 2021-01-06 (×3): qty 2

## 2021-01-06 MED ORDER — ETOMIDATE 2 MG/ML IV SOLN
INTRAVENOUS | Status: DC | PRN
  Administered 2021-01-06 (×2): 20 mg via INTRAVENOUS

## 2021-01-06 MED ORDER — NOREPINEPHRINE 4 MG/250ML-% IV SOLN
0.0000 ug/min | INTRAVENOUS | Status: DC
  Administered 2021-01-07: 5 ug/min via INTRAVENOUS
  Administered 2021-01-08: 2 ug/min via INTRAVENOUS
  Filled 2021-01-06 (×3): qty 250

## 2021-01-06 MED ORDER — SODIUM CHLORIDE 0.45 % IV SOLN: INTRAVENOUS | Status: DC | PRN

## 2021-01-06 MED ORDER — DOBUTAMINE INFUSION FOR EP/ECHO/NUC (1000 MCG/ML)
INTRAVENOUS | Status: DC | PRN
  Administered 2021-01-06: 2.5 ug/kg/min via INTRAVENOUS

## 2021-01-06 MED ORDER — MIDAZOLAM HCL (PF) 10 MG/2ML IJ SOLN
INTRAMUSCULAR | Status: AC
  Filled 2021-01-06: qty 2

## 2021-01-06 MED ORDER — LIDOCAINE HCL (PF) 2 % IJ SOLN
INTRAMUSCULAR | Status: AC
  Filled 2021-01-06: qty 5

## 2021-01-06 MED ORDER — PHENYLEPHRINE 40 MCG/ML (10ML) SYRINGE FOR IV PUSH (FOR BLOOD PRESSURE SUPPORT)
PREFILLED_SYRINGE | INTRAVENOUS | Status: DC | PRN
  Administered 2021-01-06 (×2): 80 ug via INTRAVENOUS
  Administered 2021-01-06: 120 ug via INTRAVENOUS
  Administered 2021-01-06: 80 ug via INTRAVENOUS
  Administered 2021-01-06 (×3): 120 ug via INTRAVENOUS

## 2021-01-06 MED ORDER — ROCURONIUM BROMIDE 10 MG/ML (PF) SYRINGE
PREFILLED_SYRINGE | INTRAVENOUS | Status: AC
  Filled 2021-01-06: qty 10

## 2021-01-06 MED ORDER — SODIUM CHLORIDE (PF) 0.9 % IJ SOLN
INTRAMUSCULAR | Status: AC
  Filled 2021-01-06: qty 10

## 2021-01-06 MED ORDER — CHLORHEXIDINE GLUCONATE CLOTH 2 % EX PADS
6.0000 | MEDICATED_PAD | Freq: Every day | CUTANEOUS | Status: DC
  Administered 2021-01-06 – 2021-01-10 (×5): 6 via TOPICAL

## 2021-01-06 MED ORDER — LACTATED RINGERS IV SOLN: INTRAVENOUS | Status: DC

## 2021-01-06 MED ORDER — FENTANYL CITRATE (PF) 250 MCG/5ML IJ SOLN
INTRAMUSCULAR | Status: DC | PRN
  Administered 2021-01-06 (×3): 250 ug via INTRAVENOUS
  Administered 2021-01-06: 100 ug via INTRAVENOUS
  Administered 2021-01-06 (×3): 250 ug via INTRAVENOUS
  Administered 2021-01-06: 150 ug via INTRAVENOUS

## 2021-01-06 MED ORDER — ACETAMINOPHEN 160 MG/5ML PO SOLN
650.0000 mg | Freq: Once | ORAL | Status: AC
  Administered 2021-01-06: 650 mg

## 2021-01-06 MED ORDER — CEFAZOLIN SODIUM-DEXTROSE 2-4 GM/100ML-% IV SOLN
2.0000 g | Freq: Three times a day (TID) | INTRAVENOUS | Status: AC
Start: 2021-01-06 — End: 2021-01-08
  Administered 2021-01-06 – 2021-01-08 (×6): 2 g via INTRAVENOUS
  Filled 2021-01-06 (×7): qty 100

## 2021-01-06 MED ORDER — POTASSIUM CHLORIDE 10 MEQ/50ML IV SOLN
10.0000 meq | INTRAVENOUS | Status: AC
Start: 2021-01-06 — End: 2021-01-07
  Administered 2021-01-06 (×3): 10 meq via INTRAVENOUS

## 2021-01-06 MED ORDER — DOCUSATE SODIUM 100 MG PO CAPS
200.0000 mg | ORAL_CAPSULE | Freq: Every day | ORAL | Status: DC
  Administered 2021-01-07 – 2021-01-12 (×5): 200 mg via ORAL
  Filled 2021-01-06 (×6): qty 2

## 2021-01-06 MED ORDER — HEPARIN SODIUM (PORCINE) 1000 UNIT/ML IJ SOLN
INTRAMUSCULAR | Status: DC | PRN
  Administered 2021-01-06: 26000 [IU] via INTRAVENOUS

## 2021-01-06 MED ORDER — DEXMEDETOMIDINE HCL IN NACL 400 MCG/100ML IV SOLN: 0.0000 ug/kg/h | INTRAVENOUS | Status: DC

## 2021-01-06 MED ORDER — ACETAMINOPHEN 500 MG PO TABS
1000.0000 mg | ORAL_TABLET | Freq: Four times a day (QID) | ORAL | Status: AC
  Administered 2021-01-06 – 2021-01-11 (×16): 1000 mg via ORAL
  Filled 2021-01-06 (×19): qty 2

## 2021-01-06 MED FILL — Potassium Chloride Inj 2 mEq/ML: INTRAVENOUS | Qty: 40 | Status: AC

## 2021-01-06 MED FILL — Heparin Sodium (Porcine) Inj 1000 Unit/ML: Qty: 1000 | Status: AC

## 2021-01-06 MED FILL — Lidocaine HCl Local Preservative Free (PF) Inj 2%: INTRAMUSCULAR | Qty: 15 | Status: AC

## 2021-01-06 SURGICAL SUPPLY — 78 items
ADAPTER MULTI PERFUSION 15 (ADAPTER) ×3 IMPLANT
BAG DECANTER FOR FLEXI CONT (MISCELLANEOUS) ×3 IMPLANT
BLADE CLIPPER SURG (BLADE) ×3 IMPLANT
BLADE STERNUM SYSTEM 6 (BLADE) ×3 IMPLANT
BNDG ELASTIC 4X5.8 VLCR STR LF (GAUZE/BANDAGES/DRESSINGS) ×6 IMPLANT
BNDG ELASTIC 6X5.8 VLCR STR LF (GAUZE/BANDAGES/DRESSINGS) ×6 IMPLANT
BNDG GAUZE ELAST 4 BULKY (GAUZE/BANDAGES/DRESSINGS) ×6 IMPLANT
CABLE SURGICAL S-101-97-12 (CABLE) ×3 IMPLANT
CANISTER SUCT 3000ML PPV (MISCELLANEOUS) ×3 IMPLANT
CANNULA MC2 2 STG 29/37 NON-V (CANNULA) ×2 IMPLANT
CANNULA MC2 TWO STAGE (CANNULA) ×1
CANNULA NON VENT 20FR 12 (CANNULA) ×3 IMPLANT
CATH ROBINSON RED A/P 18FR (CATHETERS) ×6 IMPLANT
CONN ST 1/2X1/2  BEN (MISCELLANEOUS) ×2
CONN ST 1/2X1/2 BEN (MISCELLANEOUS) ×2 IMPLANT
CONNECTOR BLAKE 2:1 CARIO BLK (MISCELLANEOUS) ×3 IMPLANT
CONTAINER PROTECT SURGISLUSH (MISCELLANEOUS) ×3 IMPLANT
DRAIN CHANNEL 19F RND (DRAIN) ×9 IMPLANT
DRAPE CARDIOVASCULAR INCISE (DRAPES) ×3
DRAPE SRG 135X102X78XABS (DRAPES) ×2 IMPLANT
DRAPE WARM FLUID 44X44 (DRAPES) ×3 IMPLANT
DRESSING AQUACEL AG SP 3.5X10 (GAUZE/BANDAGES/DRESSINGS) ×2 IMPLANT
DRSG AQUACEL AG ADV 3.5X10 (GAUZE/BANDAGES/DRESSINGS) ×3 IMPLANT
DRSG AQUACEL AG SP 3.5X10 (GAUZE/BANDAGES/DRESSINGS) ×3
ELECT BLADE 4.0 EZ CLEAN MEGAD (MISCELLANEOUS) ×3
ELECT REM PT RETURN 9FT ADLT (ELECTROSURGICAL) ×6
ELECTRODE BLDE 4.0 EZ CLN MEGD (MISCELLANEOUS) ×2 IMPLANT
ELECTRODE REM PT RTRN 9FT ADLT (ELECTROSURGICAL) ×4 IMPLANT
FELT TEFLON 1X6 (MISCELLANEOUS) ×3 IMPLANT
GAUZE SPONGE 4X4 12PLY STRL (GAUZE/BANDAGES/DRESSINGS) ×3 IMPLANT
GAUZE SPONGE 4X4 12PLY STRL LF (GAUZE/BANDAGES/DRESSINGS) ×9 IMPLANT
GLOVE SURG ENC MOIS LTX SZ7 (GLOVE) ×6 IMPLANT
GLOVE SURG ENC TEXT LTX SZ7.5 (GLOVE) ×6 IMPLANT
GOWN STRL REUS W/ TWL LRG LVL3 (GOWN DISPOSABLE) ×8 IMPLANT
GOWN STRL REUS W/ TWL XL LVL3 (GOWN DISPOSABLE) ×4 IMPLANT
GOWN STRL REUS W/TWL LRG LVL3 (GOWN DISPOSABLE) ×12
GOWN STRL REUS W/TWL XL LVL3 (GOWN DISPOSABLE) ×6
HEMOSTAT POWDER SURGIFOAM 1G (HEMOSTASIS) ×9 IMPLANT
INSERT SUTURE HOLDER (MISCELLANEOUS) ×3 IMPLANT
KIT BASIN OR (CUSTOM PROCEDURE TRAY) ×3 IMPLANT
KIT SUCTION CATH 14FR (SUCTIONS) ×3 IMPLANT
KIT TURNOVER KIT B (KITS) ×3 IMPLANT
KIT VASOVIEW HEMOPRO 2 VH 4000 (KITS) ×3 IMPLANT
LINE EXTENSION DELIVERY (MISCELLANEOUS) ×3 IMPLANT
MARKER GRAFT CORONARY BYPASS (MISCELLANEOUS) ×6 IMPLANT
NS IRRIG 1000ML POUR BTL (IV SOLUTION) ×15 IMPLANT
PACK ACCESSORY CANNULA KIT (KITS) ×3 IMPLANT
PACK E OPEN HEART (SUTURE) ×3 IMPLANT
PACK OPEN HEART (CUSTOM PROCEDURE TRAY) ×3 IMPLANT
PAD ARMBOARD 7.5X6 YLW CONV (MISCELLANEOUS) ×6 IMPLANT
PAD ELECT DEFIB RADIOL ZOLL (MISCELLANEOUS) ×3 IMPLANT
PENCIL BUTTON HOLSTER BLD 10FT (ELECTRODE) ×3 IMPLANT
POSITIONER HEAD DONUT 9IN (MISCELLANEOUS) ×3 IMPLANT
PUNCH AORTIC ROTATE 4.0MM (MISCELLANEOUS) ×3 IMPLANT
SET MPS 3-ND DEL (MISCELLANEOUS) ×6 IMPLANT
SOL ANTI FOG 6CC (MISCELLANEOUS) ×2 IMPLANT
SOLUTION ANTI FOG 6CC (MISCELLANEOUS) ×1
SUPPORT HEART JANKE-BARRON (MISCELLANEOUS) ×3 IMPLANT
SUT BONE WAX W31G (SUTURE) ×3 IMPLANT
SUT ETHIBOND X763 2 0 SH 1 (SUTURE) ×6 IMPLANT
SUT MNCRL AB 3-0 PS2 18 (SUTURE) ×6 IMPLANT
SUT PDS AB 1 CTX 36 (SUTURE) ×6 IMPLANT
SUT PROLENE 4 0 SH DA (SUTURE) ×3 IMPLANT
SUT PROLENE 5 0 C 1 36 (SUTURE) ×9 IMPLANT
SUT PROLENE 7 0 BV1 MDA (SUTURE) ×3 IMPLANT
SUT STEEL 6MS V (SUTURE) ×6 IMPLANT
SUT VIC AB 2-0 CT1 27 (SUTURE) ×3
SUT VIC AB 2-0 CT1 TAPERPNT 27 (SUTURE) ×2 IMPLANT
SUT VIC AB 3-0 X1 27 (SUTURE) ×6 IMPLANT
SYSTEM SAHARA CHEST DRAIN ATS (WOUND CARE) ×3 IMPLANT
TAPE CLOTH SURG 4X10 WHT LF (GAUZE/BANDAGES/DRESSINGS) ×3 IMPLANT
TAPE PAPER 2X10 WHT MICROPORE (GAUZE/BANDAGES/DRESSINGS) ×3 IMPLANT
TOWEL GREEN STERILE (TOWEL DISPOSABLE) ×3 IMPLANT
TOWEL GREEN STERILE FF (TOWEL DISPOSABLE) ×3 IMPLANT
TRAY FOLEY SLVR 14FR TEMP STAT (SET/KITS/TRAYS/PACK) ×3 IMPLANT
TUBING LAP HI FLOW INSUFFLATIO (TUBING) ×3 IMPLANT
UNDERPAD 30X36 HEAVY ABSORB (UNDERPADS AND DIAPERS) ×3 IMPLANT
WATER STERILE IRR 1000ML POUR (IV SOLUTION) ×6 IMPLANT

## 2021-01-06 NOTE — Op Note (Signed)
[address]       [place] [address]             [phone]                                          01/06/2021 Patient:  Sonia Morales Pre-Op Dx: NSTEMI LM CAD Cardiogenic shock Diabetes Mellitus Obesity  HTN   Post-op Dx:  same Procedure: CABG X 2.  LIMA LAD, RSVG OM1   Endoscopic greater saphenous vein harvest on the right   Surgeon and Role:      * Jasmyne Lodato, Eliezer Lofts, MD - Primary    Webb Laws, PA-C - assisting  Anesthesia  general EBL:  Blood Administration: none Xclamp Time:  32 min Pump Time:   Drains: 19 F blake drain:  L, mediastinal  Wires: none Counts: correct   Indications: 65 yo female who presented with severe LM disease, and reduced EF.  During the hospitalization, she had ongoing anginal symptoms and hypotension.  She was taken back to the cath lab for placement of an IABP with improvement in her symptoms.  She comes to the OR today for CABG 2. Findings: Calcified LAD.  Good OM.  Small vein, small LIMA.  Operative Technique: All invasive lines were placed in pre-op holding.  After the risks, benefits and alternatives were thoroughly discussed, the patient was brought to the operative theatre.  Anesthesia was induced, and the patient was prepped and draped in normal sterile fashion.  An appropriate surgical pause was performed, and pre-operative antibiotics were dosed accordingly.  We began with simultaneous incisions along the right leg for harvesting of the greater saphenous vein and the chest for the sternotomy.  In regards to the sternotomy, this was carried down with bovie cautery, and the sternum was divided with a reciprocating saw.  Meticulous hemostasis was obtained.  The left internal thoracic artery was exposed and harvested in in pedicled fashion.  The patient was systemically heparinized, and the artery was divided distally, and placed in a papaverine sponge.    The sternal elevator was removed, and a  retractor was placed.  The pericardium was divided in the midline and fashioned into a cradle with pericardial stitches.   After we confirmed an appropriate ACT, the ascending aorta was cannulated in standard fashion.  The right atrial appendage was used for venous cannulation site.  Cardiopulmonary bypass was initiated, and the heart retractor was placed. The cross clamp was applied, and a dose of anterograde cardioplegia was given with good arrest of the heart.  Next we exposed the lateral wall, and found a good target on the OM.  An end to side anastomosis with the vein graft was then created.  Finally, we exposed a good target on the LAD, and fashioned an end to side anastomosis between it and the LITA.  We began to re-warm, and a re-animation dose of cardioplegia was given.  The heart was de-aired, and the cross clamp was removed.  Meticulous hemostasis was obtained.    A partial occludding clamp was then placed on the ascending aorta, and we created an end to side anastomosis between it and the proximal vein graft.  The proximal site was marked with a ring.  Hemostasis was obtained, and we separated from cardiopulmonary bypass without event on 1:1 with the IABP.  The heparin was reversed with protamine.  Chest tubes and wires were placed, and the sternum was re-approximated with sternal wires.  The soft tissue and skin were re-approximated wth absorbable suture.    The patient tolerated the procedure without any immediate complications, and was transferred to the ICU in guarded condition.  Lorely Bubb Keane Scrape

## 2021-01-06 NOTE — Anesthesia Postprocedure Evaluation (Signed)
Anesthesia Post Note  Patient: Sonia Morales  Procedure(s) Performed: CORONARY ARTERY BYPASS GRAFTING (CABG), ON PUMP, TIMES TWO USING LEFT INTERNAL MAMMARY ARTERY AND RIGHT ENDOSCOPICALLY HARVESTED GREATER SAPHENOUS VEIN (Chest) TRANSESOPHAGEAL ECHOCARDIOGRAM (TEE)     Patient location during evaluation: SICU Anesthesia Type: General Level of consciousness: sedated Pain management: pain level controlled Vital Signs Assessment: post-procedure vital signs reviewed and stable Respiratory status: patient remains intubated per anesthesia plan Cardiovascular status: stable Postop Assessment: no apparent nausea or vomiting Anesthetic complications: no   No notable events documented.  Last Vitals:  Vitals:   01/06/21 1455 01/06/21 1500  BP:    Pulse:  94  Resp:    Temp:  (!) 36.4 C  SpO2: 100% 98%    Last Pain:  Vitals:   01/06/21 0800  TempSrc:   PainSc: 4                  Jaequan Propes DANIEL

## 2021-01-06 NOTE — Progress Notes (Signed)
     [address]       [place] [address]             [phone]       No events overnight Vitals:   01/06/21 0645 01/06/21 0700  BP: (!) 117/57 (!) 115/58  Pulse: 79 82  Resp: 15 16  Temp:    SpO2: 96% 96%   Alert NAD Sinus Levo 1. Good aug on IABP  Creat trending down  OR today for CABG 2  Sonia Morales O Sonia Morales

## 2021-01-06 NOTE — Progress Notes (Signed)
ANTICOAGULATION CONSULT NOTE - Follow Up Consult  Pharmacy Consult for Heparin Indication: chest pain/ACS  No Known Allergies  Patient Measurements: Height: 5\' 3"  (160 cm) Weight: 85.9 kg (189 lb 6 oz) IBW/kg (Calculated) : 52.4 Heparin Dosing Weight: 71.7   Vital Signs: Temp: 99 F (37.2 C) (09/23 0000) Temp Source: Oral (09/23 0000) BP: 100/50 (09/23 0015) Pulse Rate: 71 (09/23 0015)  Labs: Recent Labs    01/04/21 0328 01/05/21 0054 01/05/21 2004 01/05/21 2314 01/05/21 2321  HGB 12.0 11.3*  --   --  11.5*  HCT 36.9 34.4*  --   --  34.6*  PLT 229 211  --   --  214  HEPARINUNFRC 0.31 0.25*  --  0.28*  --   CREATININE 1.00 1.26* 0.91  --   --      Estimated Creatinine Clearance: 65.7 mL/min (by C-G formula based on SCr of 0.91 mg/dL).   Medical History: Past Medical History:  Diagnosis Date   Acute anterior wall MI (HCC) 10/04/2015   Acute MI anterior wall first episode care [facility]) 10/04/2015   Coronary angiogram 10/04/2015: Occluded proximal LAD, superior takeoff of ramus intermediate, large circumflex, moderate to large RCA with mild diffuse disease. S/P thrombectomy, overlapping stents to the proximal LAD with 3.5 x 18, 3.5 x 12 mm Xience DES, 100% to 0%.    CAD (coronary artery disease), native coronary artery 10/11/2015   Coronary angiogram 10/04/2015: Left main mild calcification. LAD tortuous origin, occluded in the proximal segment. Diagonal 2 is moderate-sized, has proximal 50% stenosis. S/P thrombectomy & DES overlapping 3.5 x 18 and 3.5 x 12 mm Xience Alpine stents,   Ramus intermediate has a tortuous origin. Circumflex mild disease. Right coronary artery mild diffuse disease   Colitis    DM (diabetes mellitus), type 2, uncontrolled with complications (HCC) 06/12/2018   H/O tobacco use, presenting hazards to health 06/12/2018   Quit in 2017 after MI   Hypercholesteremia 06/12/2018    Medications:  Infusions:   sodium chloride 250 mL (01/02/21 1415)   sodium  chloride 250 mL (01/02/21 1827)   sodium chloride 10 mL/hr at 01/06/21 0000   sodium chloride      ceFAZolin (ANCEF) IV      ceFAZolin (ANCEF) IV     dexmedetomidine     heparin 30,000 units/NS 1000 mL solution for CELLSAVER     heparin 1,250 Units/hr (01/06/21 0000)   milrinone     nitroGLYCERIN 10 mcg/min (01/06/21 0000)   nitroGLYCERIN     norepinephrine (LEVOPHED) Adult infusion 4 mcg/min (01/06/21 0000)   norepinephrine     tranexamic acid (CYKLOKAPRON) infusion ([facility])     vancomycin      Assessment: 65 yo F presenting with acute on chronic HF. R/LHC on 9/19 showing severe distal LM stenosis and compensated ischemic cardiomyopathy. No documented anticoagulant prior to admission.  On IV heparin while awaiting CABG assessment. She is now s/p IABP and heparin was restarted in cath lab.  9/23 AM update:  Heparin level just below goal CABG later today  Goal of Therapy:  Heparin level 0.3-0.5 units/mL Monitor platelets by anticoagulation protocol: Yes   Plan:  Inc heparin slightly to 1300 units/hr F/U after CABG  10/23, PharmD, BCPS Clinical Pharmacist Phone: [phone];

## 2021-01-06 NOTE — Hospital Course (Addendum)
History of Present Illness: AT TIME OF CONSULTATION     Patient is a 65 year old female with history of multiple cardiac comorbidities including hypertension, hyperlipidemia, uncontrolled type 2 diabetes mellitus and known coronary artery disease.  She is status post myocardial infarction and LAD PCI in 2017.  She has known ischemic cardiomyopathy.  She was admitted to the hospital with acute on chronic systolic heart failure.  She presented with acute hypoxic respiratory failure with pulmonary edema to [facility] on 12/30/2020 with symptoms most notable for acute shortness of breath.  She was found to be in acute hypoxic respiratory failure.  She was treated with BiPAP and IV diuresis with improvement and was subsequently transferred to [facility] for further evaluation and treatment.  Her previous cardiology treatment has been by Dr. Jacinto Halim.  The patient also has noted that she has been having a burning sensation in her retrosternal region for approximately 1 week prior to presentation.  She also notes progressive exertional dyspnea and orthopnea and  mild lower extremity edema.  She was recently treated with antibiotics and inhaler by her PCP with no relief.  She was admitted for further evaluation and treatment has subsequently undergone cardiac catheterization and echocardiogram.  She is noted to have severe distal left main 95% stenosis with low normal pressures.  High sensitive troponins have been in the 100-300 range consistent with likely type II myocardial infarction.  She is continued on aspirin and statin but Coreg has been placed on hold.  It is noted that her A1c has historically been greater than 11%.  Hemoglobin A1c on 12/31/2020 was 8.9.  She does take insulin 70/30 at home reportedly at 40 units twice daily the full reports are listed below.  We are asked to see the patient in cardiothoracic surgical consultation for consideration of coronary artery surgical revascularization.  It appears her  creatinine peaked at 1.41 with a BUN of 43 but is currently trending down.   Hospital course:  The patient had continued medical management with the cardiology service.  Including placing a preoperative intra-aortic balloon pump when she developed further symptoms of chest pain and some hemodynamic instability.  She was felt however to be medically stable for proceeding surgery and was taken the OR neurologically 01/06/2021.  She underwent CABG x2 by Dr. Cliffton Asters.  She tolerated the procedure well and was taken to the surgical intensive care unit in stable condition. She was extubated the evening of surgery without difficulty. She was weaned off Levophed drip. Theone Murdoch, a line, chest tubes and foley were all removed early in her post operative course. IABP remained and then was removed on 09/24. She was weaned off the Insulin drip. Her pre op HGA1C was 8.9. She will need close medical follow up after discharge. She had expected post op blood loss anemia. Her last H and H was **. She also had thrombocytopenia. Her platelet count went as low as 92,000. It was later up to 188,000.  She was transfused 1 unit of packed red blood cells on postoperative day #3 for hemoglobin of 7.3.  Hemodynamics continue to improve and by postop day 3 she was off vasopressin as well as all inotropic support.  She was diuresed aggressively with IV Lasix initially and this was transitioned to oral Lasix on postop day 4.  Orders were placed for transfer to 4 E. progressive care.  She developed mild hyponatremia was monitored closely.  Chest tubes were left in place for a few extra days following  surgery for drainage.  Plavix was initiated since she presented with acute myocardial infarction.  By the fourth postoperative day, she was ambulating in the hall with the rehab team a distance of 500 feet.  Her chest tube was removed on 9/27.  Follow up CXR showed no pneumothorax.  She remains volume overloaded and diuresed accordingly.  We  had difficulty weaning the patients oxygen and she will likely require home use, which will be arranged.

## 2021-01-06 NOTE — Transfer of Care (Signed)
Immediate Anesthesia Transfer of Care Note  Patient: Sonia Morales  Procedure(s) Performed: CORONARY ARTERY BYPASS GRAFTING (CABG), ON PUMP, TIMES TWO USING LEFT INTERNAL MAMMARY ARTERY AND RIGHT ENDOSCOPICALLY HARVESTED GREATER SAPHENOUS VEIN (Chest) TRANSESOPHAGEAL ECHOCARDIOGRAM (TEE)  Patient Location: ICU  Anesthesia Type:General  Level of Consciousness: Patient remains intubated per anesthesia plan  Airway & Oxygen Therapy: Patient remains intubated per anesthesia plan and Patient placed on Ventilator (see vital sign flow sheet for setting)  Post-op Assessment: Report given to RN and Post -op Vital signs reviewed and stable  Post vital signs: Reviewed and stable  Last Vitals:  Vitals Value Taken Time  BP 89/71 01/06/21 1446  Temp    Pulse 94 01/06/21 1451  Resp 11 01/06/21 1451  SpO2 100 % 01/06/21 1451  Vitals shown include unvalidated device data.  Last Pain:  Vitals:   01/06/21 0800  TempSrc:   PainSc: 4       Patients Stated Pain Goal: 4 (01/06/21 0800)  Complications: No notable events documented.

## 2021-01-06 NOTE — Procedures (Signed)
Extubation Procedure Note  Patient Details:   Name: Sonia Morales DOB: March 13, 1956 MRN: [id number]   Airway Documentation:    Vent end date: 01/06/21 Vent end time: 1845   Evaluation  O2 sats: stable throughout Complications: No apparent complications Patient did tolerate procedure well. Bilateral Breath Sounds: Diminished (coarse)   Patient extubated per rapid wean protocol & placed on 4L [place]. Patient passed weaning parameters with NIF -25, VC- 750 & positive cuff leak. Patient able to speak & cough post extubation.  Jacqulynn Cadet 01/06/2021, 6:46 PM

## 2021-01-06 NOTE — Plan of Care (Signed)
Stable during shift. Prepped for CABG this am. Alert, oriented, calm and cooperative but expresses some fears about surgery, responded well to education and reassurance.   Remains on IABP, 1:1 trigger with good augmentation, waveform on console appropriate but pressure tracing on bedside monitor dampened (MD on dayshift aware/assessed). Following mobility restrictions, feet warm, doppler pulses. NSR 70-80. Heparin gtt, trending Hep xa. Lactic acid < 2 x 3.   NTG gtt @ 10 mcg/min, denies chest pain. Low dose levo - presently at 1 mcg/min (ranged from 1-5 mcg/min during shift) for goal of SBP > 90 and MAP > 65. Using IV Watch for peripheral infusion of levo, no complications.   Abdominal discomfort continues, + bowel sounds, miralax po and oxy po x 1. Purewick, good output off approx 1.8 L/24 hr.   Husband and granddaughter at bedside this morning, updated on pt condition and plan.    Problem: Education: Goal: Knowledge of General Education information will improve Description: Including pain rating scale, medication(s)/side effects and non-pharmacologic comfort measures Outcome: Progressing   Problem: Clinical Measurements: Goal: Ability to maintain clinical measurements within normal limits will improve Outcome: Progressing Goal: Will remain free from infection Outcome: Progressing   Problem: Coping: Goal: Level of anxiety will decrease Outcome: Progressing   Problem: Elimination: Goal: Will not experience complications related to urinary retention Outcome: Progressing   Problem: Pain Managment: Goal: General experience of comfort will improve Outcome: Progressing   Problem: Safety: Goal: Ability to remain free from injury will improve Outcome: Progressing   Problem: Skin Integrity: Goal: Risk for impaired skin integrity will decrease Outcome: Progressing

## 2021-01-06 NOTE — Anesthesia Procedure Notes (Signed)
Arterial Line Insertion Start/End9/23/2022 10:38 AM Performed by: Drema Pry, CRNA, CRNA  Patient location: OR. Preanesthetic checklist: patient identified, IV checked, monitors and equipment checked and pre-op evaluation Left, radial was placed Catheter size: 20 G Hand hygiene performed  and maximum sterile barriers used   Attempts: 1 Procedure performed without using ultrasound guided technique. Following insertion, dressing applied and Biopatch. Post procedure assessment: normal  Patient tolerated the procedure well with no immediate complications.

## 2021-01-06 NOTE — Progress Notes (Signed)
Pt aware of NPO status for CABG in am. Expresses some fear/worry about surgery, education and pre surgical teaching reinforced.   Remains on IABP. Denies CP. NTG gtt @ 10 mcg/min, levo gtt being titrated (presently 5 mcg/min) for SBP>90, MAP > 65.

## 2021-01-06 NOTE — Progress Notes (Signed)
Subjective:  No chest pain this morning Feels okay  IABP 1:1 Augmented pressure waveform slightly dampened but cuff MAPs are 70s  Cr improved  Objective:  Vital Signs in the last 24 hours: Temp:  [98.1 F (36.7 C)-99 F (37.2 C)] 98.3 F (36.8 C) (09/23 0715) Pulse Rate:  [69-168] 81 (09/23 0800) Resp:  [8-27] 15 (09/23 0800) BP: (76-139)/(38-99) 105/56 (09/23 0800) SpO2:  [87 %-100 %] 95 % (09/23 0800)  Intake/Output from previous day: 09/22 0701 - 09/23 0700 In: 1019.6 [P.O.:420; I.V.:599.6] Out: 1825 [Urine:1825]   Physical Exam Vitals and nursing note reviewed.  Constitutional:      General: She is not in acute distress.    Appearance: She is well-developed.  HENT:     Head: Normocephalic and atraumatic.  Eyes:     Conjunctiva/sclera: Conjunctivae normal.     Pupils: Pupils are equal, round, and reactive to light.  Neck:     Vascular: No JVD.  Cardiovascular:     Rate and Rhythm: Normal rate and regular rhythm.     Pulses: Intact distal pulses.          Dorsalis pedis pulses are 0 on the right side and 0 on the left side.       Posterior tibial pulses are 1+ on the right side and 1+ on the left side.     Heart sounds: No murmur heard.    Comments: IABP in place Pulmonary:     Effort: Pulmonary effort is normal.     Breath sounds: Normal breath sounds. No wheezing or rales.  Abdominal:     General: Bowel sounds are normal.     Palpations: Abdomen is soft.     Tenderness: There is no rebound.  Musculoskeletal:        General: No tenderness. Normal range of motion.     Right lower leg: No edema.     Left lower leg: No edema.  Lymphadenopathy:     Cervical: No cervical adenopathy.  Skin:    General: Skin is warm and dry.  Neurological:     Mental Status: She is alert and oriented to person, place, and time.     Cranial Nerves: No cranial nerve deficit.     Lab Results: BMP Recent Labs    01/05/21 0054 01/05/21 2004 01/05/21 2321  NA 135 134*  134*  K 4.3 4.5 4.4  CL 102 102 101  CO2 25 26 26   GLUCOSE 92 196* 257*  BUN 33* 22 23  CREATININE 1.26* 0.91 0.81  CALCIUM 8.9 8.9 9.0  GFRNONAA 48* >60 >60     CBC Recent Labs  Lab 12/31/20 2115 01/02/21 1432 01/05/21 2321  WBC 10.8*   < > 9.7  RBC 3.91   < > 3.79*  HGB 11.7*   < > 11.5*  HCT 36.3   < > 34.6*  PLT 234   < > 214  MCV 92.8   < > 91.3  MCH 29.9   < > 30.3  MCHC 32.2   < > 33.2  RDW 13.9   < > 13.0  LYMPHSABS 1.8  --   --   MONOABS 0.7  --   --   EOSABS 0.0  --   --   BASOSABS 0.0  --   --    < > = values in this interval not displayed.     HEMOGLOBIN A1C Lab Results  Component Value Date   HGBA1C 8.9 (H) 12/31/2020  MPG 208.73 12/31/2020    Cardiac Panel (last 3 results) (9/16-9/17 2022): HS Trop 175-->312-->261-->171 Pro BNP 2505-->4098  Lipid Panel     Component Value Date/Time   CHOL 115 12/31/2020 2115   CHOL 121 01/13/2019 1349   TRIG 84 12/31/2020 2115   HDL 46 12/31/2020 2115   HDL 46 01/13/2019 1349   CHOLHDL 2.5 12/31/2020 2115   VLDL 17 12/31/2020 2115   LDLCALC 52 12/31/2020 2115   LDLCALC 59 01/13/2019 1349   LDLDIRECT 57 01/13/2019 1349     Hepatic Function Panel Recent Labs    12/31/20 2115  PROT 6.6  ALBUMIN 3.5  AST 18  ALT 19  ALKPHOS 59  BILITOT 0.6    Chest Xray 01/01/2021: 1. Mild pulmonary interstitial edema is not significantly changed. 2. The left costophrenic angle is not imaged; a left pleural effusion can therefore not be excluded.    EKG 12/30/2020: Sinus rhythm Old anterior infarct Later T wave inversion, consider ischemia   Echocardiogram 03/29/2020:  Mildly depressed LV systolic function with visual EF 45-50%. Left  ventricle cavity is normal in size. Normal global wall motion. Doppler  evidence of grade I (impaired) diastolic dysfunction, elevated LAP.  No significant valvular abnormalities.  Compared to prior study dated 04/14/2019 no significant change.   Lexiscan Myoview Stress  Test 12/29/2018: Lexiscan stress test was performed. Stress EKG is non-diagnostic, as this is pharmacological stress test. Perfusion images reveal a very large size anterior, anteroseptal, apical and apical inferior transmural scar with moderate amount of ischemia especially at the basal septal region from base to mid ventricle.  Dynamic gated images reveal anterior, anteroseptal and apical akinesis. Stress LV EF is severely dysfunctional 29%.  High risk study. No prior studies for comparision.   Coronary angiogram 10/04/2015:  Left main mild calcification. LAD tortuous origin, occluded in the proximal segment. Diagonal 2 is moderate-sized, has proximal 50% stenosis. S/P thrombectomy & DES overlapping 3.5 x 18 and 3.5 x 12 mm Xience Alpine stents, 100% reduced to 0%. Ramus intermediate has a tortuous origin. Circumflex mild disease. Right coronary artery mild diffuse disease. Markedly elevated LVEDP, 31 mmHg.   Carotid artery duplex 08/30/2020:  Stenosis in the right internal carotid artery (50-69%). Stenosis in the  right external carotid artery (<50%).  Stenosis in the left internal carotid artery (16-49%). Stenosis in the  left external carotid artery (<50%).  Antegrade right vertebral artery flow. Antegrade left vertebral artery  flow.  No significant change since 02/05/2020. Follow up in six months is  appropriate if clinically indicated.       Assessment/Plan   65 y.o. Caucasian female  with prior hypertension, hyperlipidemia, uncontrolled type 2 DM, CAD ( MI, LAD PCI 2017), ischemic cardiomyopathy, admitted with acute on chronic systolic heart failure   Acute on chronic systolic heart failure: Initial presentation with acute hypoxic respiratory failure, pulmonary edema Multivessel CAD. Cr down, good urine output IABP in place Awaiting CABG today  CAD: Prior MI and LAD PCI 2017. I suspect she may have some further obstructive CAD now. HS trop 100s-300s, likely type 2  MI. Continue Aspirin, statin, nitro.  Coreg on hold due to acute decompensated heart failure.  CABG on 9/23.  AKI: Likely pre-renal due to hypotension  Monitor urine output Avoid nephrotoxic agents  Uncontrolled DM: A1C 8.9% Correctional insulin this morning. 70-30 held.   CRITICAL CARE Performed by: Truett Mainland   Total critical care time: 38 minutes   Critical care time was exclusive of separately  billable procedures and treating other patients.   Critical care was necessary to treat or prevent imminent or life-threatening deterioration.   Critical care was time spent personally by me on the following activities: development of treatment plan with patient and/or surrogate as well as nursing, discussions with consultants, evaluation of patient's response to treatment, examination of patient, obtaining history from patient or surrogate, ordering and performing treatments and interventions, ordering and review of laboratory studies, ordering and review of radiographic studies, pulse oximetry and re-evaluation of patient's condition.       Elder Negus, MD Pager: [phone] Office: [phone]

## 2021-01-06 NOTE — Anesthesia Procedure Notes (Signed)
Procedure Name: Intubation Date/Time: 01/06/2021 10:30 AM Performed by: Janace Litten, CRNA Pre-anesthesia Checklist: Patient identified, Emergency Drugs available, Suction available and Patient being monitored Patient Re-evaluated:Patient Re-evaluated prior to induction Oxygen Delivery Method: Circle System Utilized Preoxygenation: Pre-oxygenation with 100% oxygen Induction Type: IV induction Ventilation: Mask ventilation without difficulty Laryngoscope Size: Mac and 3 Grade View: Grade I Tube type: Oral Tube size: 7.5 mm Number of attempts: 1 Airway Equipment and Method: Stylet and Oral airway Placement Confirmation: ETT inserted through vocal cords under direct vision, positive ETCO2 and breath sounds checked- equal and bilateral Secured at: 21 cm Tube secured with: Tape Dental Injury: Teeth and Oropharynx as per pre-operative assessment

## 2021-01-06 NOTE — Brief Op Note (Signed)
12/31/2020 - 01/06/2021  7:33 AM  PATIENT:  Sonia Morales  65 y.o. female  PRE-OPERATIVE DIAGNOSIS:  Coronary Artery Disease  POST-OPERATIVE DIAGNOSIS:  Coronary Artery Disease  PROCEDURE:  Procedure(s): CORONARY ARTERY BYPASS GRAFTING (CABG), ON PUMP, TIMES TWO USING LEFT INTERNAL MAMMARY ARTERY AND RIGHT ENDOSCOPICALLY HARVESTED GREATER SAPHENOUS VEIN (N/A) TRANSESOPHAGEAL ECHOCARDIOGRAM (TEE) (N/A) LIMA-LAD SVG-OM EVH 55 MIN SURGEON:  Surgeon(s) and Role:    * Lightfoot, Eliezer Lofts, MD - Primary  PHYSICIAN ASSISTANT: Makaylie Dedeaux PA-C  ANESTHESIA:   general  EBL:  441 mL   BLOOD ADMINISTERED:none  DRAINS:  MEDIASTINAL AND LEFT PLEURAL CHEST TUBES    LOCAL MEDICATIONS USED:  NONE  SPECIMEN:  No Specimen  DISPOSITION OF SPECIMEN:  N/A  COUNTS:  YES  TOURNIQUET:  * No tourniquets in log *  DICTATION: .Dragon Dictation  PLAN OF CARE: Admit to inpatient   PATIENT DISPOSITION:  ICU - intubated and hemodynamically stable.   Delay start of Pharmacological VTE agent (>24hrs) due to surgical blood loss or risk of bleeding: yes  COMPLICATIONS: NO KNOWN

## 2021-01-07 ENCOUNTER — Inpatient Hospital Stay (HOSPITAL_COMMUNITY): Payer: Medicare Other

## 2021-01-07 LAB — CBC
HCT: 27.9 % — ABNORMAL LOW (ref 36.0–46.0)
HCT: 24.7 % — ABNORMAL LOW (ref 36.0–46.0)
Hemoglobin: 9 g/dL — ABNORMAL LOW (ref 12.0–15.0)
Hemoglobin: 8 g/dL — ABNORMAL LOW (ref 12.0–15.0)
MCH: 29.9 pg (ref 26.0–34.0)
MCH: 30.3 pg (ref 26.0–34.0)
MCHC: 32.3 g/dL (ref 30.0–36.0)
MCHC: 32.4 g/dL (ref 30.0–36.0)
MCV: 92.7 fL (ref 80.0–100.0)
MCV: 93.6 fL (ref 80.0–100.0)
Platelets: 121 10*3/uL — ABNORMAL LOW (ref 150–400)
Platelets: 97 10*3/uL — ABNORMAL LOW (ref 150–400)
RBC: 3.01 MIL/uL — ABNORMAL LOW (ref 3.87–5.11)
RBC: 2.64 MIL/uL — ABNORMAL LOW (ref 3.87–5.11)
RDW: 13.2 % (ref 11.5–15.5)
RDW: 13.3 % (ref 11.5–15.5)
WBC: 12.5 10*3/uL — ABNORMAL HIGH (ref 4.0–10.5)
WBC: 12.3 10*3/uL — ABNORMAL HIGH (ref 4.0–10.5)
nRBC: 0 % (ref 0.0–0.2)
nRBC: 0.7 % — ABNORMAL HIGH (ref 0.0–0.2)

## 2021-01-07 LAB — GLUCOSE, CAPILLARY
Glucose-Capillary: 108 mg/dL — ABNORMAL HIGH (ref 70–99)
Glucose-Capillary: 114 mg/dL — ABNORMAL HIGH (ref 70–99)
Glucose-Capillary: 114 mg/dL — ABNORMAL HIGH (ref 70–99)
Glucose-Capillary: 114 mg/dL — ABNORMAL HIGH (ref 70–99)
Glucose-Capillary: 115 mg/dL — ABNORMAL HIGH (ref 70–99)
Glucose-Capillary: 117 mg/dL — ABNORMAL HIGH (ref 70–99)
Glucose-Capillary: 123 mg/dL — ABNORMAL HIGH (ref 70–99)
Glucose-Capillary: 127 mg/dL — ABNORMAL HIGH (ref 70–99)
Glucose-Capillary: 131 mg/dL — ABNORMAL HIGH (ref 70–99)
Glucose-Capillary: 137 mg/dL — ABNORMAL HIGH (ref 70–99)
Glucose-Capillary: 155 mg/dL — ABNORMAL HIGH (ref 70–99)
Glucose-Capillary: 227 mg/dL — ABNORMAL HIGH (ref 70–99)

## 2021-01-07 LAB — BASIC METABOLIC PANEL
Anion gap: 7 (ref 5–15)
Anion gap: 7 (ref 5–15)
BUN: 11 mg/dL (ref 8–23)
BUN: 10 mg/dL (ref 8–23)
CO2: 22 mmol/L (ref 22–32)
CO2: 21 mmol/L — ABNORMAL LOW (ref 22–32)
Calcium: 8.4 mg/dL — ABNORMAL LOW (ref 8.9–10.3)
Calcium: 8.1 mg/dL — ABNORMAL LOW (ref 8.9–10.3)
Chloride: 103 mmol/L (ref 98–111)
Chloride: 103 mmol/L (ref 98–111)
Creatinine, Ser: 0.73 mg/dL (ref 0.44–1.00)
Creatinine, Ser: 0.83 mg/dL (ref 0.44–1.00)
GFR, Estimated: >60 mL/min (ref >60)
GFR, Estimated: >60 mL/min (ref >60)
Glucose, Bld: 125 mg/dL — ABNORMAL HIGH (ref 70–99)
Glucose, Bld: 198 mg/dL — ABNORMAL HIGH (ref 70–99)
Potassium: 4.4 mmol/L (ref 3.5–5.1)
Potassium: 4.2 mmol/L (ref 3.5–5.1)
Sodium: 132 mmol/L — ABNORMAL LOW (ref 135–145)
Sodium: 131 mmol/L — ABNORMAL LOW (ref 135–145)

## 2021-01-07 LAB — MAGNESIUM
Magnesium: 2 mg/dL (ref 1.7–2.4)
Magnesium: 1.7 mg/dL (ref 1.7–2.4)

## 2021-01-07 LAB — POCT ACTIVATED CLOTTING TIME: Activated Clotting Time: 126 seconds

## 2021-01-07 MED ORDER — VASOPRESSIN 20 UNITS/100 ML INFUSION FOR SHOCK
0.0000 [IU]/min | INTRAVENOUS | Status: DC
  Administered 2021-01-07: 0.04 [IU]/min via INTRAVENOUS
  Administered 2021-01-08: 0.03 [IU]/min via INTRAVENOUS
  Administered 2021-01-08: 0.04 [IU]/min via INTRAVENOUS
  Filled 2021-01-07 (×3): qty 100

## 2021-01-07 MED ORDER — INSULIN ASPART 100 UNIT/ML IJ SOLN
0.0000 [IU] | INTRAMUSCULAR | Status: DC
  Administered 2021-01-07: 8 [IU] via SUBCUTANEOUS
  Administered 2021-01-07: 2 [IU] via SUBCUTANEOUS
  Administered 2021-01-08: 4 [IU] via SUBCUTANEOUS
  Administered 2021-01-08 (×4): 8 [IU] via SUBCUTANEOUS
  Administered 2021-01-08: 4 [IU] via SUBCUTANEOUS

## 2021-01-07 MED ORDER — ENOXAPARIN SODIUM 40 MG/0.4ML IJ SOSY
40.0000 mg | PREFILLED_SYRINGE | Freq: Every day | INTRAMUSCULAR | Status: DC
  Administered 2021-01-07 – 2021-01-10 (×4): 40 mg via SUBCUTANEOUS
  Filled 2021-01-07 (×4): qty 0.4

## 2021-01-07 NOTE — Progress Notes (Signed)
[address]       [place] [address]             [phone]                 1 Day Post-Op Procedure(s) (LRB): CORONARY ARTERY BYPASS GRAFTING (CABG), ON PUMP, TIMES TWO USING LEFT INTERNAL MAMMARY ARTERY AND RIGHT ENDOSCOPICALLY HARVESTED GREATER SAPHENOUS VEIN (N/A) TRANSESOPHAGEAL ECHOCARDIOGRAM (TEE) (N/A)   Events: No events _______________________________________________________________ Vitals: BP (!) 85/67   Pulse 99   Temp 99.5 F (37.5 C)   Resp 18   Ht 5\' 3"  (1.6 m)   Wt 90.5 kg   SpO2 93%   BMI 35.34 kg/m  Filed Weights   01/04/21 0443 01/05/21 0500 01/07/21 0500  Weight: 85.6 kg 85.9 kg 90.5 kg     - Neuro: alert NAD  - Cardiovascular: sinus  Drips: levo 5.   PAP: (25-73)/(10-42) 46/21 CVP:  [0 mmHg-54 mmHg] 54 mmHg CO:  [3.5 L/min-7.4 L/min] 7.3 L/min CI:  [1.9 L/min/m2-3.9 L/min/m2] 3.9 L/min/m2 IABP in pace with good aug.  Switched to 1:3 with no significant change in hemodynamics - Pulm: EWOB   ABG    Component Value Date/Time   PHART 7.352 01/06/2021 1940   PCO2ART 42.0 01/06/2021 1940   PO2ART 61 (L) 01/06/2021 1940   HCO3 23.2 01/06/2021 1940   TCO2 24 01/06/2021 1940   ACIDBASEDEF 2.0 01/06/2021 1940   O2SAT 89.0 01/06/2021 1940    - Abd: soft - Extremity: warm  .Intake/Output      09/23 0701 09/24 0700 09/24 0701 09/25 0700   P.O. 0 50   I.V. (mL/kg) 2890.3 (31.9) 84.7 (0.9)   Blood 250    IV Piggyback 1097.5 20   Total Intake(mL/kg) 4237.8 (46.8) 154.7 (1.7)   Urine (mL/kg/hr) 2650 (1.2) 175 (0.6)   Blood 441    Chest Tube 420 30   Total Output 3511 205   Net +726.8 -50.3           _______________________________________________________________ Labs: CBC Latest Ref Rng & Units 01/07/2021 01/06/2021 01/06/2021  WBC 4.0 - 10.5 K/uL 12.5(H) 12.5(H) -  Hemoglobin 12.0 - 15.0 g/dL 9.0(L) 9.8(L) 9.2(L)  Hematocrit 36.0 - 46.0 % 27.9(L) 29.5(L) 27.0(L)  Platelets 150 - 400 K/uL 121(L) 144(L) -    CMP Latest Ref Rng & Units 01/07/2021 01/06/2021 01/06/2021  Glucose 70 - 99 mg/dL 01/08/2021) 387(F) -  BUN 8 - 23 mg/dL 11 14 -  Creatinine 643(P - 1.00 mg/dL 2.95 1.88 -  Sodium 4.16 - 145 mmol/L 132(L) 134(L) 136  Potassium 3.5 - 5.1 mmol/L 4.4 4.3 4.1  Chloride 98 - 111 mmol/L 103 106 -  CO2 22 - 32 mmol/L 22 20(L) -  Calcium 8.9 - 10.3 mg/dL 606) 3.0(Z) -  Total Protein 6.5 - 8.1 g/dL - - -  Total Bilirubin 0.3 - 1.2 mg/dL - - -  Alkaline Phos 38 - 126 U/L - - -  AST 15 - 41 U/L - - -  ALT 0 - 44 U/L - - -    CXR: stable  _______________________________________________________________  Assessment and Plan: POD 1 s/p CABG  Neuro: will improve pain control CV: will remove IABP.  Will give more volume, and wean levo.  Will remove swan later today Pulm: continue pulm toilet Renal: creat stable GI: will advance diet once IABP out Heme: stable ID: low dose fever.  Will watch Endo: SSI Dispo: continue ICU care  Corliss Skains 01/07/2021 10:09 AM

## 2021-01-07 NOTE — Progress Notes (Signed)
Called MD as patients need for norepi has increased. I had the norepi down to 2, and have had to increase norepi back to 6 mcg. Pt  urine output has decreased to 30 ml each hour for the past two hours. SV is 94. SVR is 432. CO is 10.1 CI is 5.3. Hard to maintain maps of 70 as pt's diastolic high 30's to mid 40's. MD verbal order to start vasopressin at 0.04, and use systolic numbers for titrating. Awaiting pm labs to result. Monitoring closely.   Delories Heinz, RN

## 2021-01-07 NOTE — Progress Notes (Signed)
IABP removed from right femoral artery and pressure held x 30 minutes.  Site looks good with no hematoma.  Vitals stable throughout hold.  Dressed with 4x4 and tegaderm.  Instructions given for bedrest.  Distal dp pulses still able to doppler. RN will continue to monitor site.

## 2021-01-07 NOTE — Plan of Care (Signed)

## 2021-01-07 NOTE — Plan of Care (Signed)
  Problem: Education: Goal: Will demonstrate proper wound care and an understanding of methods to prevent future damage Outcome: Progressing Goal: Knowledge of disease or condition will improve Outcome: Progressing Goal: Knowledge of the prescribed therapeutic regimen will improve Outcome: Progressing Goal: Individualized Educational Video(s) Outcome: Progressing   Problem: Activity: Goal: Risk for activity intolerance will decrease Outcome: Progressing   Problem: Cardiac: Goal: Will achieve and/or maintain hemodynamic stability Outcome: Progressing   Problem: Clinical Measurements: Goal: Postoperative complications will be avoided or minimized Outcome: Progressing   Problem: Respiratory: Goal: Respiratory status will improve Outcome: Progressing   Problem: Skin Integrity: Goal: Risk for impaired skin integrity will decrease Outcome: Progressing   Problem: Urinary Elimination: Goal: Ability to achieve and maintain adequate renal perfusion and functioning will improve Outcome: Progressing   Problem: Education: Goal: Will demonstrate proper wound care and an understanding of methods to prevent future damage Outcome: Progressing Goal: Knowledge of disease or condition will improve Outcome: Progressing Goal: Knowledge of the prescribed therapeutic regimen will improve Outcome: Progressing Goal: Individualized Educational Video(s) Outcome: Progressing   Problem: Cardiac: Goal: Will achieve and/or maintain hemodynamic stability Outcome: Progressing   Problem: Clinical Measurements: Goal: Postoperative complications will be avoided or minimized Outcome: Progressing   Problem: Respiratory: Goal: Respiratory status will improve Outcome: Progressing   Problem: Skin Integrity: Goal: Wound healing without signs and symptoms of infection Outcome: Progressing   Problem: Urinary Elimination: Goal: Ability to achieve and maintain adequate renal perfusion and functioning  will improve Outcome: Progressing

## 2021-01-08 ENCOUNTER — Inpatient Hospital Stay (HOSPITAL_COMMUNITY): Payer: Medicare Other

## 2021-01-08 LAB — GLUCOSE, CAPILLARY
Glucose-Capillary: 182 mg/dL — ABNORMAL HIGH (ref 70–99)
Glucose-Capillary: 194 mg/dL — ABNORMAL HIGH (ref 70–99)
Glucose-Capillary: 201 mg/dL — ABNORMAL HIGH (ref 70–99)
Glucose-Capillary: 219 mg/dL — ABNORMAL HIGH (ref 70–99)
Glucose-Capillary: 234 mg/dL — ABNORMAL HIGH (ref 70–99)
Glucose-Capillary: 243 mg/dL — ABNORMAL HIGH (ref 70–99)

## 2021-01-08 LAB — CBC
HCT: 24.9 % — ABNORMAL LOW (ref 36.0–46.0)
Hemoglobin: 8.1 g/dL — ABNORMAL LOW (ref 12.0–15.0)
MCH: 30.5 pg (ref 26.0–34.0)
MCHC: 32.5 g/dL (ref 30.0–36.0)
MCV: 93.6 fL (ref 80.0–100.0)
Platelets: 92 10*3/uL — ABNORMAL LOW (ref 150–400)
RBC: 2.66 MIL/uL — ABNORMAL LOW (ref 3.87–5.11)
RDW: 13 % (ref 11.5–15.5)
WBC: 12 10*3/uL — ABNORMAL HIGH (ref 4.0–10.5)
nRBC: 0 % (ref 0.0–0.2)

## 2021-01-08 LAB — TYPE AND SCREEN
ABO/RH(D): A NEG
Antibody Screen: NEGATIVE
Unit division: 0
Unit division: 0

## 2021-01-08 LAB — BASIC METABOLIC PANEL
Anion gap: 8 (ref 5–15)
BUN: 10 mg/dL (ref 8–23)
CO2: 21 mmol/L — ABNORMAL LOW (ref 22–32)
Calcium: 8.2 mg/dL — ABNORMAL LOW (ref 8.9–10.3)
Chloride: 99 mmol/L (ref 98–111)
Creatinine, Ser: 0.71 mg/dL (ref 0.44–1.00)
GFR, Estimated: >60 mL/min (ref >60)
Glucose, Bld: 213 mg/dL — ABNORMAL HIGH (ref 70–99)
Potassium: 4.4 mmol/L (ref 3.5–5.1)
Sodium: 128 mmol/L — ABNORMAL LOW (ref 135–145)

## 2021-01-08 LAB — BPAM RBC
Blood Product Expiration Date: 202209282359
Blood Product Expiration Date: 202210012359
ISSUE DATE / TIME: 202209081015
Unit Type and Rh: 600
Unit Type and Rh: 600

## 2021-01-08 MED ORDER — MIDODRINE HCL 5 MG PO TABS
10.0000 mg | ORAL_TABLET | Freq: Three times a day (TID) | ORAL | Status: DC
  Administered 2021-01-08 – 2021-01-12 (×12): 10 mg via ORAL
  Filled 2021-01-08 (×12): qty 2

## 2021-01-08 MED ORDER — FUROSEMIDE 10 MG/ML IJ SOLN
40.0000 mg | Freq: Once | INTRAMUSCULAR | Status: AC
  Administered 2021-01-08: 40 mg via INTRAVENOUS
  Filled 2021-01-08: qty 4

## 2021-01-08 MED ORDER — INSULIN ASPART 100 UNIT/ML IJ SOLN: 0.0000 [IU] | INTRAMUSCULAR | Status: DC

## 2021-01-08 MED ORDER — INSULIN DETEMIR 100 UNIT/ML ~~LOC~~ SOLN
10.0000 [IU] | Freq: Every day | SUBCUTANEOUS | Status: DC
  Administered 2021-01-08 – 2021-01-09 (×2): 10 [IU] via SUBCUTANEOUS
  Filled 2021-01-08 (×3): qty 0.1

## 2021-01-08 MED ORDER — INSULIN ASPART 100 UNIT/ML IJ SOLN
0.0000 [IU] | Freq: Three times a day (TID) | INTRAMUSCULAR | Status: DC
  Administered 2021-01-09: 4 [IU] via SUBCUTANEOUS
  Administered 2021-01-09: 2 [IU] via SUBCUTANEOUS
  Administered 2021-01-09 – 2021-01-10 (×2): 8 [IU] via SUBCUTANEOUS
  Administered 2021-01-10 (×3): 4 [IU] via SUBCUTANEOUS
  Administered 2021-01-11: 8 [IU] via SUBCUTANEOUS
  Administered 2021-01-11: 12 [IU] via SUBCUTANEOUS
  Administered 2021-01-11: 2 [IU] via SUBCUTANEOUS
  Administered 2021-01-11: 12 [IU] via SUBCUTANEOUS
  Administered 2021-01-12: 4 [IU] via SUBCUTANEOUS

## 2021-01-08 NOTE — Progress Notes (Signed)
      [address]       [place] [address]             [phone]                 2 Days Post-Op Procedure(s) (LRB): CORONARY ARTERY BYPASS GRAFTING (CABG), ON PUMP, TIMES TWO USING LEFT INTERNAL MAMMARY ARTERY AND RIGHT ENDOSCOPICALLY HARVESTED GREATER SAPHENOUS VEIN (N/A) TRANSESOPHAGEAL ECHOCARDIOGRAM (TEE) (N/A)   Events: No events IABP removed _______________________________________________________________ Vitals: BP (!) 112/56 (BP Location: Right Arm)   Pulse 86   Temp 99.9 F (37.7 C)   Resp 15   Ht 5\' 3"  (1.6 m)   Wt 91.2 kg   SpO2 93%   BMI 35.62 kg/m  Filed Weights   01/05/21 0500 01/07/21 0500 01/08/21 0500  Weight: 85.9 kg 90.5 kg 91.2 kg     - Neuro: alert NAD  - Cardiovascular: sinus  Drips: vaso 0.03  PAP: (37-65)/(12-35) 49/24 CO:  [5.9 L/min-8.4 L/min] 5.9 L/min CI:  [3 L/min/m2-4.4 L/min/m2] 3.1 L/min/m2  - Pulm: EWOB   ABG    Component Value Date/Time   PHART 7.352 01/06/2021 1940   PCO2ART 42.0 01/06/2021 1940   PO2ART 61 (L) 01/06/2021 1940   HCO3 23.2 01/06/2021 1940   TCO2 24 01/06/2021 1940   ACIDBASEDEF 2.0 01/06/2021 1940   O2SAT 89.0 01/06/2021 1940    - Abd: soft - Extremity: warm  .Intake/Output      09/24 0701 09/25 0700 09/25 0701 09/26 0700   P.Sonia. 925    I.V. (mL/kg) 799.8 (8.8) 48.2 (0.5)   Blood     Other 0    IV Piggyback 261.8 70.5   Total Intake(mL/kg) 1986.7 (21.8) 118.7 (1.3)   Urine (mL/kg/hr) 1465 (0.7) 187 (0.6)   Stool 0    Blood     Chest Tube 485 50   Total Output 1950 237   Net +36.7 -118.3           _______________________________________________________________ Labs: CBC Latest Ref Rng & Units 01/08/2021 01/07/2021 01/07/2021  WBC 4.0 - 10.5 K/uL 12.0(H) 12.3(H) 12.5(H)  Hemoglobin 12.0 - 15.0 g/dL 8.1(L) 8.0(L) 9.0(L)  Hematocrit 36.0 - 46.0 % 24.9(L) 24.7(L) 27.9(L)  Platelets 150 - 400 K/uL 92(L) 97(L) 121(L)   CMP Latest Ref Rng & Units 01/08/2021 01/07/2021  01/07/2021  Glucose 70 - 99 mg/dL 01/09/2021) 440(H) 474(Q)  BUN 8 - 23 mg/dL 10 10 11   Creatinine 0.44 - 1.00 mg/dL 595(G 3.87  Sodium 135 - 145 mmol/L 128(L) 131(L) 132(L)  Potassium 3.5 - 5.1 mmol/L 4.4 4.2 4.4  Chloride 98 - 111 mmol/L 99 103 103  CO2 22 - 32 mmol/L 21(L) 21(L) 22  Calcium 8.9 - 10.3 mg/dL 8.2(L) 8.1(L) 8.4(L)  Total Protein 6.5 - 8.1 g/dL - - -  Total Bilirubin 0.3 - 1.2 mg/dL - - -  Alkaline Phos 38 - 126 U/L - - -  AST 15 - 41 U/L - - -  ALT 0 - 44 U/L - - -    CXR: stable  _______________________________________________________________  Assessment and Plan: POD 2 s/p CABG  Neuro: will improve pain control CV: adding midodrine.  Will wean vaso Pulm: continue pulm toilet Renal: creat stable.  Gentle diuresis GI: will advance diet Heme: stable ID: afebrile Endo: SSI.  Adding levemir Dispo: continue ICU care   Sonia Morales Sonia Morales 01/08/2021 10:20 AM

## 2021-01-08 NOTE — Plan of Care (Signed)

## 2021-01-09 ENCOUNTER — Inpatient Hospital Stay (HOSPITAL_COMMUNITY): Payer: Medicare Other

## 2021-01-09 ENCOUNTER — Encounter (HOSPITAL_COMMUNITY): Payer: Self-pay | Admitting: Thoracic Surgery (Cardiothoracic Vascular Surgery)

## 2021-01-09 LAB — GLUCOSE, CAPILLARY
Glucose-Capillary: 116 mg/dL — ABNORMAL HIGH (ref 70–99)
Glucose-Capillary: 225 mg/dL — ABNORMAL HIGH (ref 70–99)
Glucose-Capillary: 130 mg/dL — ABNORMAL HIGH (ref 70–99)
Glucose-Capillary: 195 mg/dL — ABNORMAL HIGH (ref 70–99)

## 2021-01-09 LAB — CBC
HCT: 22.4 % — ABNORMAL LOW (ref 36.0–46.0)
Hemoglobin: 7.3 g/dL — ABNORMAL LOW (ref 12.0–15.0)
MCH: 29.8 pg (ref 26.0–34.0)
MCHC: 32.6 g/dL (ref 30.0–36.0)
MCV: 91.4 fL (ref 80.0–100.0)
Platelets: 104 10*3/uL — ABNORMAL LOW (ref 150–400)
RBC: 2.45 MIL/uL — ABNORMAL LOW (ref 3.87–5.11)
RDW: 13.1 % (ref 11.5–15.5)
WBC: 9.1 10*3/uL (ref 4.0–10.5)
nRBC: 0 % (ref 0.0–0.2)

## 2021-01-09 LAB — BASIC METABOLIC PANEL
Anion gap: 7 (ref 5–15)
BUN: 14 mg/dL (ref 8–23)
CO2: 25 mmol/L (ref 22–32)
Calcium: 8.3 mg/dL — ABNORMAL LOW (ref 8.9–10.3)
Chloride: 96 mmol/L — ABNORMAL LOW (ref 98–111)
Creatinine, Ser: 0.68 mg/dL (ref 0.44–1.00)
GFR, Estimated: >60 mL/min (ref >60)
Glucose, Bld: 129 mg/dL — ABNORMAL HIGH (ref 70–99)
Potassium: 3.9 mmol/L (ref 3.5–5.1)
Sodium: 128 mmol/L — ABNORMAL LOW (ref 135–145)

## 2021-01-09 LAB — POCT ACTIVATED CLOTTING TIME: Activated Clotting Time: 126 seconds

## 2021-01-09 LAB — HEMOGLOBIN AND HEMATOCRIT, BLOOD
HCT: 28.7 % — ABNORMAL LOW (ref 36.0–46.0)
Hemoglobin: 9.7 g/dL — ABNORMAL LOW (ref 12.0–15.0)

## 2021-01-09 LAB — PREPARE RBC (CROSSMATCH)

## 2021-01-09 MED ORDER — CLOPIDOGREL BISULFATE 75 MG PO TABS
75.0000 mg | ORAL_TABLET | Freq: Every day | ORAL | Status: DC
  Administered 2021-01-09 – 2021-01-12 (×4): 75 mg via ORAL
  Filled 2021-01-09 (×4): qty 1

## 2021-01-09 MED ORDER — ASPIRIN EC 81 MG PO TBEC
81.0000 mg | DELAYED_RELEASE_TABLET | Freq: Every day | ORAL | Status: DC
  Administered 2021-01-10 – 2021-01-12 (×3): 81 mg via ORAL
  Filled 2021-01-09 (×3): qty 1

## 2021-01-09 MED ORDER — POTASSIUM CHLORIDE CRYS ER 20 MEQ PO TBCR
20.0000 meq | EXTENDED_RELEASE_TABLET | Freq: Once | ORAL | Status: AC
  Administered 2021-01-09: 20 meq via ORAL
  Filled 2021-01-09: qty 1

## 2021-01-09 MED ORDER — FUROSEMIDE 10 MG/ML IJ SOLN
40.0000 mg | Freq: Once | INTRAMUSCULAR | Status: AC
  Administered 2021-01-09: 40 mg via INTRAVENOUS
  Filled 2021-01-09: qty 4

## 2021-01-09 NOTE — Discharge Summary (Signed)
[address]       [place] [address]             [phone]    Physician Discharge Summary  Patient ID: SVETLANA BAGBY MRN: [id number] DOB/AGE: November 16, 1955 65 y.o.  Admit date: 12/31/2020 Discharge date: 01/12/2021  Admission Diagnoses:  Patient Active Problem List   Diagnosis Date Noted   Cardiogenic shock [facility])    Unstable angina (HCC)    Acute on chronic systolic heart failure (HCC) 12/31/2020   Acute on chronic systolic (congestive) heart failure (HCC) 12/31/2020   Dyspnea on exertion 01/07/2019   Abnormal nuclear stress test 01/07/2019   DM (diabetes mellitus), type 2, uncontrolled with complications (HCC) 06/12/2018   Hypercholesteremia 06/12/2018   H/O tobacco use, presenting hazards to health 06/12/2018   CAD (coronary artery disease), native coronary artery 10/11/2015   Acute myocardial infarction of anterior wall (HCC) 10/04/2015   Discharge Diagnoses:  Patient Active Problem List   Diagnosis Date Noted   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   Cardiogenic shock (HCC)    Unstable angina (HCC)    Acute on chronic systolic heart failure (HCC) 12/31/2020   Acute on chronic systolic (congestive) heart failure (HCC) 12/31/2020   Dyspnea on exertion 01/07/2019   Abnormal nuclear stress test 01/07/2019   DM (diabetes mellitus), type 2, uncontrolled with complications (HCC) 06/12/2018   Hypercholesteremia 06/12/2018   H/O tobacco use, presenting hazards to health 06/12/2018   CAD (coronary artery disease), native coronary artery 10/11/2015   Acute myocardial infarction of anterior wall (HCC) 10/04/2015     Discharged Condition: good    History of Present Illness: AT TIME OF CONSULTATION     Patient is a 65 year old female with history of multiple cardiac comorbidities including hypertension, hyperlipidemia, uncontrolled type 2 diabetes mellitus and known coronary artery disease.  She is status post myocardial infarction and LAD PCI in 2017.  She has known  ischemic cardiomyopathy.  She was admitted to the hospital with acute on chronic systolic heart failure.  She presented with acute hypoxic respiratory failure with pulmonary edema to [facility] on 12/30/2020 with symptoms most notable for acute shortness of breath.  She was found to be in acute hypoxic respiratory failure.  She was treated with BiPAP and IV diuresis with improvement and was subsequently transferred to [facility] for further evaluation and treatment.  Her previous cardiology treatment has been by Dr. Jacinto Halim.  The patient also has noted that she has been having a burning sensation in her retrosternal region for approximately 1 week prior to presentation.  She also notes progressive exertional dyspnea and orthopnea and  mild lower extremity edema.  She was recently treated with antibiotics and inhaler by her PCP with no relief.  She was admitted for further evaluation and treatment has subsequently undergone cardiac catheterization and echocardiogram.  She is noted to have severe distal left main 95% stenosis with low normal pressures.  High sensitive troponins have been in the 100-300 range consistent with likely type II myocardial infarction.  She is continued on aspirin and statin but Coreg has been placed on hold.  It is noted that her A1c has historically been greater than 11%.  Hemoglobin A1c on 12/31/2020 was 8.9.  She does take insulin 70/30 at home reportedly at 40 units twice daily the full reports are listed below.  We are asked to see the patient in cardiothoracic surgical consultation for consideration of coronary artery surgical revascularization.  It appears her creatinine peaked at 1.41 with a BUN of 43 but is currently trending down.   Hospital course:  The patient had continued medical management with the cardiology service.  Including placing a preoperative intra-aortic balloon pump when she developed further symptoms of chest pain and some hemodynamic instability.  She was  felt however to be medically stable for proceeding surgery and was taken the OR neurologically 01/06/2021.  She underwent CABG x2 by Dr. Cliffton Asters.  She tolerated the procedure well and was taken to the surgical intensive care unit in stable condition. She was extubated the evening of surgery without difficulty. She was weaned off Levophed drip. Theone Murdoch, a line, chest tubes and foley were all removed early in her post operative course. IABP remained and then was removed on 09/24. She was weaned off the Insulin drip. Her pre op HGA1C was 8.9. She will need close medical follow up after discharge. She had expected post op blood loss anemia. Her last H and H was **. She also had thrombocytopenia. Her platelet count went as low as 92,000. It was later up to 188,000.  She was transfused 1 unit of packed red blood cells on postoperative day #3 for hemoglobin of 7.3.  Hemodynamics continue to improve and by postop day 3 she was off vasopressin as well as all inotropic support.  She was diuresed aggressively with IV Lasix initially and this was transitioned to oral Lasix on postop day 4.  Orders were placed for transfer to 4 E. progressive care.  She developed mild hyponatremia was monitored closely.  Chest tubes were left in place for a few extra days following surgery for drainage.  Plavix was initiated since she presented with acute myocardial infarction.  By the fourth postoperative day, she was ambulating in the hall with the rehab team a distance of 500 feet.  Her chest tube was removed on 9/27.  Follow up CXR showed no pneumothorax.  She remains volume overloaded and diuresed accordingly.  We had difficulty weaning the patients oxygen and she will likely require home use, which will be arranged.  Consults: None  Significant Diagnostic Studies: angiography:   LM: Distal LM bifurcation calcific 95% stenosis (1,0,1) LAD: Patent ostial LAD stent         Mid diffuse 30% disease Lcx: Ostial calcific 80%  stenosis RCA: Mid 20% disease   Severe distal LM stenosis (1,0,1) Compensated ischemic cardiomyopathy  Treatments: surgery:   01/06/2021 Patient:  Cyndie Mull Pre-Op Dx: NSTEMI LM CAD Cardiogenic shock Diabetes Mellitus Obesity             HTN   Post-op Dx:  same Procedure: CABG X 2.  LIMA LAD, RSVG OM1   Endoscopic greater saphenous vein harvest on the right     Surgeon and Role:      * Lightfoot, Eliezer Lofts, MD - Primary    Webb Laws, PA-C - assisting   Discharge Exam: Blood pressure 131/60, pulse 98, temperature 97.9 F (36.6 C), temperature source Oral, resp. rate 18, height 5\' 3"  (1.6 m), weight 87.4 kg, SpO2 91 %.  General appearance: alert, cooperative, and no distress Heart: regular rate and rhythm Lungs: clear to auscultation bilaterally Abdomen: soft, non-tender; bowel sounds normal; no masses,  no organomegaly Extremities: edema trace Wound: clean and dry   Discharge Medications:  The patient has been discharged on:   1.Beta Blocker:  Yes [  x ]  No   [   ]                              If No, reason:  2.Ace Inhibitor/ARB: Yes [   ]                                     No  [ x   ]                                     If No, reason: labile BP  3.Statin:   Yes [ X  ]                  No  [   ]                  If No, reason:  4.Ecasa:  Yes  [ X  ]                  No   [   ]                  If No, reason:  Patient had ACS upon admission:  Plavix/P2Y12 inhibitor: Yes [   ]                                      No  [ x  ]   Discharge Instructions     Amb Referral to Cardiac Rehabilitation   Complete by: As directed    Diagnosis: CABG   CABG X ___: 2   After initial evaluation and assessments completed: Virtual Based Care may be provided alone or in conjunction with Phase 2 Cardiac Rehab based on patient barriers.: Yes      Allergies as of 01/12/2021   No Known Allergies      Medication List     STOP  taking these medications    carvedilol 6.25 MG tablet Commonly known as: COREG   meloxicam 15 MG tablet Commonly known as: MOBIC   sacubitril-valsartan 97-103 MG Commonly known as: ENTRESTO       TAKE these medications    Accu-Chek Guide test strip Generic drug: glucose blood Use as instructed to monitor glucose 4 times daily   Accu-Chek Softclix Lancets lancets Use as instructed to monitor glucose 4 times daily   aspirin 81 MG EC tablet Take 1 tablet (81 mg total) by mouth daily. Swallow whole.   atorvastatin 80 MG tablet Commonly known as: LIPITOR Take 1 tablet (80 mg total) by mouth daily at 6 PM. What changed: when to take this   clopidogrel 75 MG tablet Commonly known as: PLAVIX Take 1 tablet (75 mg total) by mouth daily.   DHEA 50 PO Take 1 capsule by mouth daily.   FLUoxetine 40 MG capsule Commonly known as: PROZAC Take 40 mg by mouth daily.   furosemide 40 MG tablet Commonly known as: LASIX Take 1 tablet (40 mg total) by mouth daily.   gabapentin 300 MG capsule Commonly known as: NEURONTIN Take 300 mg by mouth at bedtime.   insulin aspart protamine- aspart (70-30) 100 UNIT/ML injection Commonly known as: NOVOLOG MIX 70/30 Inject  40 Units into the skin 2 (two) times daily. Inject 50 units with breakfast and 60 units with supper if glucose is above 90 and she is eating   metoprolol tartrate 25 MG tablet Commonly known as: LOPRESSOR Take 0.5 tablets (12.5 mg total) by mouth 2 (two) times daily.   midodrine 10 MG tablet Commonly known as: PROAMATINE Take 1 tablet (10 mg total) by mouth 2 (two) times daily with a meal.   multivitamin tablet Take 1 tablet by mouth daily.   nitroGLYCERIN 0.4 MG SL tablet Commonly known as: NITROSTAT Place 1 tablet (0.4 mg total) under the tongue every 5 (five) minutes as needed for up to 25 days for chest pain.   Oxycodone HCl 10 MG Tabs Take 10-20 mg by mouth See admin instructions. Five times daily PRN for  pain   potassium chloride SA 20 MEQ tablet Commonly known as: KLOR-CON Take 1 tablet (20 mEq total) by mouth daily.   traZODone 50 MG tablet Commonly known as: DESYREL Take 50 mg by mouth at bedtime.               Durable Medical Equipment  (From admission, onward)           Start     Ordered   01/11/21 1509  For home use only DME oxygen  Once       Question Answer Comment  Length of Need 6 Months   Mode or (Route) Nasal cannula   Liters per Minute 2   Frequency Continuous (stationary and portable oxygen unit needed)   Oxygen delivery system Gas      01/11/21 1508            Follow-up Information     Yates Decamp, MD Follow up.   Specialty: Cardiology Why: Appointment to see Dr. Jacinto Halim on January 26, 2021 at 10:15 AM Contact information: [address] [place] [address] [phone]         Corliss Skains, MD Follow up.   Specialty: Cardiothoracic Surgery Why: Please see discharge paperwork for appointment to see Dr. Cliffton Asters.  Your first appointment will be a telephone appointment so do not come to the office.  A subsequent appointment will be made following that to see Dr. Cliffton Asters in the office. Contact information: [address] [place] [address] [phone]                 Signed:  Lowella Dandy, PA-C  01/12/2021, 7:33 AM

## 2021-01-09 NOTE — Progress Notes (Signed)
      [address]       [place] [address]             [phone]      POD # 3  BP 97/66   Pulse 80   Temp 99.1 F (37.3 C)   Resp 20   Ht 5\' 3"  (1.6 m)   Wt 91.2 kg   SpO2 96%   BMI 35.62 kg/m    Intake/Output Summary (Last 24 hours) at 01/09/2021 1802 Last data filed at 01/09/2021 1700 Gross per 24 hour  Intake 971.55 ml  Output 1725 ml  Net -753.45 ml    Stable day Continue current Rx  Lorianna Spadaccini C. 01/11/2021, MD [facility] [phone]

## 2021-01-09 NOTE — Progress Notes (Addendum)
[facility] DAILY ICU PROGRESS NOTE                   [address]            [place] [address]          [phone]   3 Days Post-Op Procedure(s) (LRB): CORONARY ARTERY BYPASS GRAFTING (CABG), ON PUMP, TIMES TWO USING LEFT INTERNAL MAMMARY ARTERY AND RIGHT ENDOSCOPICALLY HARVESTED GREATER SAPHENOUS VEIN (N/A) TRANSESOPHAGEAL ECHOCARDIOGRAM (TEE) (N/A)  Total Length of Stay:  LOS: 9 days   Subjective: Feeling better , minor sputum production , clear Minor incisional discomfort  Objective: Vital signs in last 24 hours: Temp:  [82.9 F (28.3 C)-100.4 F (38 C)] 98.8 F (37.1 C) (09/26 0700) Pulse Rate:  [72-91] 87 (09/26 0700) Cardiac Rhythm: Normal sinus rhythm;Heart block (09/26 0400) Resp:  [10-29] 21 (09/26 0700) BP: (84-129)/(44-96) 98/64 (09/26 0700) SpO2:  [85 %-100 %] 97 % (09/26 0700) Arterial Line BP: (81-130)/(44-63) 111/62 (09/26 0700) Weight:  [91.2 kg] 91.2 kg (09/26 0452)  Filed Weights   01/07/21 0500 01/08/21 0500 01/09/21 0452  Weight: 90.5 kg 91.2 kg 91.2 kg    Weight change: 0 kg   Hemodynamic parameters for last 24 hours: CO:  [6.2 L/min] 6.2 L/min CI:  [3.3 L/min/m2] 3.3 L/min/m2  Intake/Output from previous day: 09/25 0701 - 09/26 0700 In: 1087.2 [P.O.:840; I.V.:176.8; IV Piggyback:70.5] Out: 7672 [CNOBS:9628; Chest Tube:130]  Intake/Output this shift: No intake/output data recorded.  Current Meds: Scheduled Meds:  acetaminophen  1,000 mg Oral Q6H   Or   acetaminophen (TYLENOL) oral liquid 160 mg/5 mL  1,000 mg Per Tube Q6H   aspirin EC  325 mg Oral Daily   Or   aspirin  324 mg Per Tube Daily   atorvastatin  80 mg Oral QHS   bisacodyl  10 mg Oral Daily   Or   bisacodyl  10 mg Rectal Daily   Chlorhexidine Gluconate Cloth  6 each Topical Daily   docusate sodium  200 mg Oral Daily   enoxaparin (LOVENOX) injection  40 mg Subcutaneous QHS   FLUoxetine  40 mg Oral Daily   furosemide  40 mg Intravenous Once   gabapentin  300  mg Oral Daily   insulin aspart  0-24 Units Subcutaneous TID AC & HS   insulin detemir  10 Units Subcutaneous Daily   metoprolol tartrate  12.5 mg Oral BID   Or   metoprolol tartrate  12.5 mg Per Tube BID   midodrine  10 mg Oral TID WC   pantoprazole  40 mg Oral Daily   sodium chloride flush  3 mL Intravenous Q12H   Continuous Infusions:  sodium chloride 20 mL/hr at 01/06/21 1634   sodium chloride 10 mL (01/06/21 1545)   lactated ringers     lactated ringers Stopped (01/07/21 2227)   lactated ringers     vasopressin Stopped (01/09/21 0255)   PRN Meds:.sodium chloride, dextrose, lactated ringers, metoprolol tartrate, midazolam, morphine injection, ondansetron (ZOFRAN) IV, oxyCODONE, sodium chloride flush, traMADol  General appearance: alert, cooperative, fatigued, and no distress Heart: regular rate and rhythm Lungs: fair air exchange throughout Abdomen: soft, non tender Extremities: no LE edema Wound: RLE evh site ok, dressings on other incisions clean and dry  Lab Results: CBC: Recent Labs    01/08/21 0500 01/09/21 0513  WBC 12.0* 9.1  HGB 8.1* 7.3*  HCT 24.9* 22.4*  PLT 92* 104*   BMET:  Recent Labs  01/08/21 0500 01/09/21 0513  NA 128* 128*  K 4.4 3.9  CL 99 96*  CO2 21* 25  GLUCOSE 213* 129*  BUN 10 14  CREATININE 0.71 0.68  CALCIUM 8.2* 8.3*    CMET: Lab Results  Component Value Date   WBC 9.1 01/09/2021   HGB 7.3 (L) 01/09/2021   HCT 22.4 (L) 01/09/2021   PLT 104 (L) 01/09/2021   GLUCOSE 129 (H) 01/09/2021   CHOL 115 12/31/2020   TRIG 84 12/31/2020   HDL 46 12/31/2020   LDLDIRECT 57 01/13/2019   LDLCALC 52 12/31/2020   ALT 19 12/31/2020   AST 18 12/31/2020   NA 128 (L) 01/09/2021   K 3.9 01/09/2021   CL 96 (L) 01/09/2021   CREATININE 0.68 01/09/2021   BUN 14 01/09/2021   CO2 25 01/09/2021   TSH 1.444 12/31/2020   INR 1.3 (H) 01/06/2021   HGBA1C 8.9 (H) 12/31/2020      PT/INR:  Recent Labs    01/06/21 1451  LABPROT 16.5*  INR  1.3*   Radiology: [facility] Chest Port 1 View  Result Date: 01/09/2021 CLINICAL DATA:  65 year old female postoperative day 3 status post CABG. EXAM: PORTABLE CHEST 1 VIEW COMPARISON:  Portable chest 01/08/2021 and earlier. FINDINGS: Portable AP semi upright view at 0508 hours. Right IJ introducer sheath, inferior approach Swan-Ganz catheter, and left chest tube appears stable. Stable lung volumes and mediastinal contours. No pneumothorax identified. Patchy and confluent left perihilar opacity persists, not significantly changed. Stable ventilation elsewhere. Small if any pleural effusions. No overt edema. Negative visible bowel gas pattern. Stable visualized osseous structures. Prior anterior and posterior cervical fusion. IMPRESSION: 1. Stable lines and tubes. No pneumothorax. 2. Ongoing asymmetric and confluent left perihilar opacity, greater than that typical of atelectasis. Asymmetric pulmonary edema might be possible, but aspiration or pneumonia not excluded. Electronically Signed   By: Odessa Fleming M.D.   On: 01/09/2021 05:57     Assessment/Plan: S/P Procedure(s) (LRB): CORONARY ARTERY BYPASS GRAFTING (CABG), ON PUMP, TIMES TWO USING LEFT INTERNAL MAMMARY ARTERY AND RIGHT ENDOSCOPICALLY HARVESTED GREATER SAPHENOUS VEIN (N/A) TRANSESOPHAGEAL ECHOCARDIOGRAM (TEE) (N/A)  POD#3 1 tmax 100.4, sBP 84-129, aline correlates, mostly 100's /50's diastolic- sinus rhythm, vasopressin stopped this am- no pressors 2 sats ok on 4 liters 3 hyponatremia- trending lower, watch on diuretics, partially pseudohyponatremia with elevated BS's. Normal renal fxn,UOP is reasonable but weight up about 6 KG.  BS variable 116 - 240's range 4 anemia now in transfusion threshold- to receive 1 unit PRBC's 5 no leukocytosis (resolved) 6 thrombocytopenia trend improved, now 104K 7 CXR left sided ASD, d/w with Dr Cliffton Asters,  will follow closely and hopefully she will be able to mobilize better today- she has diuretic ordered. Push pulm  toilet as able 8 CT- 130 cc/24 h- keeping for now     Rowe Clack PA-C Pager [phone] 01/09/2021 7:33 AM   Agree with above. Doing well.  Weaned off all pressor support. We will remove arterial line. Chest x-ray shows opacity in the left lung. Gentle diuresis today after the patient receives 1 unit of packed red blood cells Ambulation today. We will keep chest tubes for now  Marleni Gallardo O Liliya Fullenwider

## 2021-01-09 NOTE — Plan of Care (Signed)

## 2021-01-10 ENCOUNTER — Inpatient Hospital Stay (HOSPITAL_COMMUNITY): Payer: Medicare Other

## 2021-01-10 LAB — BPAM RBC
Blood Product Expiration Date: 202209282359
ISSUE DATE / TIME: 202209261057
Unit Type and Rh: 600

## 2021-01-10 LAB — CBC
HCT: 30.2 % — ABNORMAL LOW (ref 36.0–46.0)
Hemoglobin: 9.9 g/dL — ABNORMAL LOW (ref 12.0–15.0)
MCH: 30.3 pg (ref 26.0–34.0)
MCHC: 32.8 g/dL (ref 30.0–36.0)
MCV: 92.4 fL (ref 80.0–100.0)
Platelets: 188 10*3/uL (ref 150–400)
RBC: 3.27 MIL/uL — ABNORMAL LOW (ref 3.87–5.11)
RDW: 13.5 % (ref 11.5–15.5)
WBC: 8.5 10*3/uL (ref 4.0–10.5)
nRBC: 0 % (ref 0.0–0.2)

## 2021-01-10 LAB — BASIC METABOLIC PANEL
Anion gap: 8 (ref 5–15)
BUN: 14 mg/dL (ref 8–23)
CO2: 25 mmol/L (ref 22–32)
Calcium: 8.6 mg/dL — ABNORMAL LOW (ref 8.9–10.3)
Chloride: 102 mmol/L (ref 98–111)
Creatinine, Ser: 0.84 mg/dL (ref 0.44–1.00)
GFR, Estimated: >60 mL/min (ref >60)
Glucose, Bld: 187 mg/dL — ABNORMAL HIGH (ref 70–99)
Potassium: 3.8 mmol/L (ref 3.5–5.1)
Sodium: 135 mmol/L (ref 135–145)

## 2021-01-10 LAB — GLUCOSE, CAPILLARY
Glucose-Capillary: 177 mg/dL — ABNORMAL HIGH (ref 70–99)
Glucose-Capillary: 229 mg/dL — ABNORMAL HIGH (ref 70–99)
Glucose-Capillary: 180 mg/dL — ABNORMAL HIGH (ref 70–99)

## 2021-01-10 LAB — TYPE AND SCREEN
ABO/RH(D): A NEG
Antibody Screen: NEGATIVE
Unit division: 0

## 2021-01-10 MED ORDER — FUROSEMIDE 40 MG PO TABS
40.0000 mg | ORAL_TABLET | Freq: Every day | ORAL | Status: DC
  Administered 2021-01-10 – 2021-01-12 (×3): 40 mg via ORAL
  Filled 2021-01-10 (×3): qty 1

## 2021-01-10 MED ORDER — SODIUM CHLORIDE 0.9% FLUSH: 3.0000 mL | INTRAVENOUS | Status: DC | PRN

## 2021-01-10 MED ORDER — ~~LOC~~ CARDIAC SURGERY, PATIENT & FAMILY EDUCATION: Freq: Once | Status: AC

## 2021-01-10 MED ORDER — POTASSIUM CHLORIDE CRYS ER 20 MEQ PO TBCR
40.0000 meq | EXTENDED_RELEASE_TABLET | Freq: Every day | ORAL | Status: DC
  Administered 2021-01-10 – 2021-01-12 (×3): 40 meq via ORAL
  Filled 2021-01-10 (×3): qty 2

## 2021-01-10 MED ORDER — SODIUM CHLORIDE 0.9% FLUSH
3.0000 mL | Freq: Two times a day (BID) | INTRAVENOUS | Status: DC
  Administered 2021-01-10: 3 mL via INTRAVENOUS

## 2021-01-10 MED ORDER — INSULIN DETEMIR 100 UNIT/ML ~~LOC~~ SOLN
12.0000 [IU] | Freq: Two times a day (BID) | SUBCUTANEOUS | Status: DC
  Administered 2021-01-10 (×2): 12 [IU] via SUBCUTANEOUS
  Filled 2021-01-10 (×4): qty 0.12

## 2021-01-10 MED ORDER — SODIUM CHLORIDE 0.9 % IV SOLN: 250.0000 mL | INTRAVENOUS | Status: DC | PRN

## 2021-01-10 MED FILL — Sodium Chloride IV Soln 0.9%: INTRAVENOUS | Qty: 2000 | Status: AC

## 2021-01-10 MED FILL — Electrolyte-R (PH 7.4) Solution: INTRAVENOUS | Qty: 4000 | Status: AC

## 2021-01-10 MED FILL — Mannitol IV Soln 20%: INTRAVENOUS | Qty: 500 | Status: AC

## 2021-01-10 MED FILL — Calcium Chloride Inj 10%: INTRAVENOUS | Qty: 10 | Status: AC

## 2021-01-10 MED FILL — Heparin Sodium (Porcine) Inj 1000 Unit/ML: INTRAMUSCULAR | Qty: 10 | Status: AC

## 2021-01-10 MED FILL — Sodium Bicarbonate IV Soln 8.4%: INTRAVENOUS | Qty: 50 | Status: AC

## 2021-01-10 NOTE — Progress Notes (Signed)
[address]       [place] [address]             [phone]      4 Days Post-Op  Procedure(s) (LRB): CORONARY ARTERY BYPASS GRAFTING (CABG), ON PUMP, TIMES TWO USING LEFT INTERNAL MAMMARY ARTERY AND RIGHT ENDOSCOPICALLY HARVESTED GREATER SAPHENOUS VEIN (N/A) TRANSESOPHAGEAL ECHOCARDIOGRAM (TEE) (N/A)   Total Length of Stay:  LOS: 10 days    SUBJECTIVE: Says she has had a good day, just returned from a walk.  No new problems.   Vitals:   01/10/21 0800 01/10/21 1200  BP: (!) 89/50 105/88  Pulse: 94 78  Resp: (!) 22 18  Temp: 97.9 F (36.6 C) 97.6 F (36.4 C)  SpO2: 93% 94%    Intake/Output      09/26 0701 09/27 0700 09/27 0701 09/28 0700   P.O. 120 240   I.V. (mL/kg)     Blood 315    Other     IV Piggyback     Total Intake(mL/kg) 435 (7.2) 240 (4)   Urine (mL/kg/hr) 3155 (2.2)    Other  100   Stool     Chest Tube 250    Total Output 3405 100   Net -2970 +140            sodium chloride 20 mL/hr at 01/06/21 1634   sodium chloride 10 mL (01/06/21 1545)   sodium chloride     lactated ringers     lactated ringers Stopped (01/07/21 2227)   lactated ringers      CBC    Component Value Date/Time   WBC 8.5 01/10/2021 0823   RBC 3.27 (L) 01/10/2021 0823   HGB 9.9 (L) 01/10/2021 0823   HGB 10.8 (L) 01/13/2019 1349   HCT 30.2 (L) 01/10/2021 0823   HCT 33.6 (L) 01/13/2019 1349   PLT 188 01/10/2021 0823   PLT 222 01/13/2019 1349   MCV 92.4 01/10/2021 0823   MCV 88 01/13/2019 1349   MCH 30.3 01/10/2021 0823   MCHC 32.8 01/10/2021 0823   RDW 13.5 01/10/2021 0823   RDW 12.4 01/13/2019 1349   LYMPHSABS 1.8 12/31/2020 2115   MONOABS 0.7 12/31/2020 2115   EOSABS 0.0 12/31/2020 2115   BASOSABS 0.0 12/31/2020 2115   CMP     Component Value Date/Time   NA 135 01/10/2021 0823   NA 137 01/13/2019 1349   K 3.8 01/10/2021 0823   CL 102 01/10/2021 0823   CO2 25 01/10/2021 0823   GLUCOSE 187 (H) 01/10/2021 0823   BUN 14 01/10/2021  0823   BUN 24 (A) 08/31/2020 0000   CREATININE 0.84 01/10/2021 0823   CALCIUM 8.6 (L) 01/10/2021 0823   PROT 6.6 12/31/2020 2115   PROT 6.7 01/13/2019 1349   ALBUMIN 3.5 12/31/2020 2115   ALBUMIN 4.1 01/13/2019 1349   AST 18 12/31/2020 2115   ALT 19 12/31/2020 2115   ALKPHOS 59 12/31/2020 2115   BILITOT 0.6 12/31/2020 2115   BILITOT <0.2 01/13/2019 1349   GFRNONAA >60 01/10/2021 0823   GFRAA 61 01/13/2019 1349   ABG    Component Value Date/Time   PHART 7.352 01/06/2021 1940   PCO2ART 42.0 01/06/2021 1940   PO2ART 61 (L) 01/06/2021 1940   HCO3 23.2 01/06/2021 1940   TCO2 24 01/06/2021 1940   ACIDBASEDEF 2.0 01/06/2021 1940   O2SAT 89.0 01/06/2021 1940   CBG (last 3)  Recent Labs    01/09/21 2143 01/10/21  0800 01/10/21 1152  GLUCAP 195* 177* 229*     ASSESSMENT:  -Stable VS and cardiac rhythm. Advancing with mobility.  Awaiting bed for transfer to 4E.    Leary Roca, PA-C

## 2021-01-10 NOTE — Progress Notes (Deleted)
Patient pulling on Bipap face mask because she cannot breathe. This nurse removed the Bipap as patient requested and placed nasal cannula to administer 3 liters of oxygen. Patient refuse to have the Bipap restarted at this time.

## 2021-01-10 NOTE — Progress Notes (Signed)
CARDIAC REHAB PHASE I   PRE:  Rate/Rhythm: 76 SR  BP:  Sitting: 115/74      SaO2: 95 3L  MODE:  Ambulation: 500 ft   POST:  Rate/Rhythm: 88 SR  BP:  Sitting: 113/89    SaO2: 95 3L   Pt agreeable to ambulate. Pt assisted to BR, than ambulated 548ft in hallway. Pt states increased strength and motivated to increase distance. Pt checking to see if she has an available walker at home. Will continue to follow.  [id number] Reynold Bowen, RN BSN 01/10/2021 3:16 PM

## 2021-01-10 NOTE — Progress Notes (Addendum)
TCTS DAILY ICU PROGRESS NOTE                   [address]            [place] [address]          [phone]   4 Days Post-Op Procedure(s) (LRB): CORONARY ARTERY BYPASS GRAFTING (CABG), ON PUMP, TIMES TWO USING LEFT INTERNAL MAMMARY ARTERY AND RIGHT ENDOSCOPICALLY HARVESTED GREATER SAPHENOUS VEIN (N/A) TRANSESOPHAGEAL ECHOCARDIOGRAM (TEE) (N/A)  Total Length of Stay:  LOS: 10 days   Subjective: Feeling better but still having some discomfort related to the chest tube.  Tolerating PO's. BM since surgery.   Objective: Vital signs in last 24 hours: Temp:  [97.3 F (36.3 C)-99.5 F (37.5 C)] 98.2 F (36.8 C) (09/27 0400) Pulse Rate:  [61-88] 83 (09/27 0400) Cardiac Rhythm: Normal sinus rhythm;Heart block (09/27 0400) Resp:  [11-27] 15 (09/27 0400) BP: (78-177)/(44-145) 96/55 (09/27 0400) SpO2:  [91 %-99 %] 98 % (09/27 0400) Arterial Line BP: (81-119)/(46-80) 81/74 (09/26 1300) Weight:  [60.3 kg] 60.3 kg (09/27 0700)  Filed Weights   01/08/21 0500 01/09/21 0452 01/10/21 0700  Weight: 91.2 kg 91.2 kg 60.3 kg    Weight change: -30.9 kg    Intake/Output from previous day: 09/26 0701 - 09/27 0700 In: 435 [P.O.:120; Blood:315] Out: 3405 [Urine:3155; Chest Tube:250]  Intake/Output this shift: No intake/output data recorded.  Current Meds: Scheduled Meds:  acetaminophen  1,000 mg Oral Q6H   Or   acetaminophen (TYLENOL) oral liquid 160 mg/5 mL  1,000 mg Per Tube Q6H   aspirin EC  81 mg Oral Daily   atorvastatin  80 mg Oral QHS   bisacodyl  10 mg Oral Daily   Or   bisacodyl  10 mg Rectal Daily   Chlorhexidine Gluconate Cloth  6 each Topical Daily   clopidogrel  75 mg Oral Daily    Cardiac Surgery, Patient & Family Education   Does not apply Once   docusate sodium  200 mg Oral Daily   enoxaparin (LOVENOX) injection  40 mg Subcutaneous QHS   FLUoxetine  40 mg Oral Daily   furosemide  40 mg Oral Daily   gabapentin  300 mg Oral Daily    insulin aspart  0-24 Units Subcutaneous TID AC & HS   insulin detemir  10 Units Subcutaneous Daily   metoprolol tartrate  12.5 mg Oral BID   Or   metoprolol tartrate  12.5 mg Per Tube BID   midodrine  10 mg Oral TID WC   pantoprazole  40 mg Oral Daily   potassium chloride  40 mEq Oral Daily   sodium chloride flush  3 mL Intravenous Q12H   sodium chloride flush  3 mL Intravenous Q12H   Continuous Infusions:  sodium chloride 20 mL/hr at 01/06/21 1634   sodium chloride 10 mL (01/06/21 1545)   sodium chloride     lactated ringers     lactated ringers Stopped (01/07/21 2227)   lactated ringers     PRN Meds:.sodium chloride, sodium chloride, dextrose, lactated ringers, metoprolol tartrate, midazolam, morphine injection, ondansetron (ZOFRAN) IV, oxyCODONE, sodium chloride flush, sodium chloride flush, traMADol  General appearance: alert, cooperative, no distress Heart: regular rate and rhythm, rare ventricular arrhythmias Lungs: breath sounds clear, shallow in bases Abdomen: soft, non tender Extremities: no LE edema, both feet warm and well perfused.  Wound: RLE evh site ok, dressings on other incisions clean and dry  Lab Results: CBC:  Recent Labs    01/08/21 0500 01/09/21 0513 01/09/21 1731  WBC 12.0* 9.1  --   HGB 8.1* 7.3* 9.7*  HCT 24.9* 22.4* 28.7*  PLT 92* 104*  --     BMET:  Recent Labs    01/08/21 0500 01/09/21 0513  NA 128* 128*  K 4.4 3.9  CL 99 96*  CO2 21* 25  GLUCOSE 213* 129*  BUN 10 14  CREATININE 0.71 0.68  CALCIUM 8.2* 8.3*     CMET: Lab Results  Component Value Date   WBC 9.1 01/09/2021   HGB 9.7 (L) 01/09/2021   HCT 28.7 (L) 01/09/2021   PLT 104 (L) 01/09/2021   GLUCOSE 129 (H) 01/09/2021   CHOL 115 12/31/2020   TRIG 84 12/31/2020   HDL 46 12/31/2020   LDLDIRECT 57 01/13/2019   LDLCALC 52 12/31/2020   ALT 19 12/31/2020   AST 18 12/31/2020   NA 128 (L) 01/09/2021   K 3.9 01/09/2021   CL 96 (L) 01/09/2021   CREATININE 0.68 01/09/2021    BUN 14 01/09/2021   CO2 25 01/09/2021   TSH 1.444 12/31/2020   INR 1.3 (H) 01/06/2021   HGBA1C 8.9 (H) 12/31/2020      PT/INR:  No results for input(s): LABPROT, INR in the last 72 hours.  Radiology: No results found.   Assessment/Plan: S/P Procedure(s) (LRB): CORONARY ARTERY BYPASS GRAFTING (CABG), ON PUMP, TIMES TWO USING LEFT INTERNAL MAMMARY ARTERY AND RIGHT ENDOSCOPICALLY HARVESTED GREATER SAPHENOUS VEIN (N/A) TRANSESOPHAGEAL ECHOCARDIOGRAM (TEE) (N/A)  POD#4 CABG CV- SBP ~ 100, on Midodrine. Mostly SR with few PVC's. Plavix started for acute MI pre-op.   PULM- sats ok on 4 liters, CXR showing improved aeration in the left base. Continue working on pulmonary hygiene. CT drained 267ml/24 hours, leaving in place today  HEME- Expected acute blood loss anemia, transfused 1 unit PRBC's yesterday with appropriate response. Monitor.   ENDO- pre-op A1C 8.9. Fair control on SSI and Levemir.  Will increase the Levemir since oral intake has improved.   VOLUME EXCESS- net 3L diuresis yesterday, convert to po Lasix. Na++ 128 and stable, monitor  DVT PPX- on daily enoxaparin.   Disposition- TXF to 4E when bed available.    Leary Roca PA-C Pager [phone] 01/10/2021 7:52 AM    Agree with above Doing well CXR improved Will transfer to floor Will remove chest tubes tomorrow  Corliss Skains

## 2021-01-11 ENCOUNTER — Inpatient Hospital Stay (HOSPITAL_COMMUNITY): Payer: Medicare Other

## 2021-01-11 LAB — CBC
HCT: 28.4 % — ABNORMAL LOW (ref 36.0–46.0)
Hemoglobin: 9.3 g/dL — ABNORMAL LOW (ref 12.0–15.0)
MCH: 30.5 pg (ref 26.0–34.0)
MCHC: 32.7 g/dL (ref 30.0–36.0)
MCV: 93.1 fL (ref 80.0–100.0)
Platelets: 213 10*3/uL (ref 150–400)
RBC: 3.05 MIL/uL — ABNORMAL LOW (ref 3.87–5.11)
RDW: 13.6 % (ref 11.5–15.5)
WBC: 8.1 10*3/uL (ref 4.0–10.5)
nRBC: 0.2 % (ref 0.0–0.2)

## 2021-01-11 LAB — GLUCOSE, CAPILLARY
Glucose-Capillary: 155 mg/dL — ABNORMAL HIGH (ref 70–99)
Glucose-Capillary: 270 mg/dL — ABNORMAL HIGH (ref 70–99)
Glucose-Capillary: 214 mg/dL — ABNORMAL HIGH (ref 70–99)
Glucose-Capillary: 142 mg/dL — ABNORMAL HIGH (ref 70–99)
Glucose-Capillary: 283 mg/dL — ABNORMAL HIGH (ref 70–99)

## 2021-01-11 LAB — BASIC METABOLIC PANEL
Anion gap: 8 (ref 5–15)
BUN: 19 mg/dL (ref 8–23)
CO2: 26 mmol/L (ref 22–32)
Calcium: 8.6 mg/dL — ABNORMAL LOW (ref 8.9–10.3)
Chloride: 102 mmol/L (ref 98–111)
Creatinine, Ser: 0.83 mg/dL (ref 0.44–1.00)
GFR, Estimated: >60 mL/min (ref >60)
Glucose, Bld: 203 mg/dL — ABNORMAL HIGH (ref 70–99)
Potassium: 4.3 mmol/L (ref 3.5–5.1)
Sodium: 136 mmol/L (ref 135–145)

## 2021-01-11 MED ORDER — INSULIN DETEMIR 100 UNIT/ML ~~LOC~~ SOLN
14.0000 [IU] | Freq: Two times a day (BID) | SUBCUTANEOUS | Status: DC
  Administered 2021-01-11 – 2021-01-12 (×3): 14 [IU] via SUBCUTANEOUS
  Filled 2021-01-11 (×4): qty 0.14

## 2021-01-11 NOTE — Progress Notes (Addendum)
TCTS DAILY ICU PROGRESS NOTE                   [address]            [place] [address]          [phone]   5 Days Post-Op Procedure(s) (LRB): CORONARY ARTERY BYPASS GRAFTING (CABG), ON PUMP, TIMES TWO USING LEFT INTERNAL MAMMARY ARTERY AND RIGHT ENDOSCOPICALLY HARVESTED GREATER SAPHENOUS VEIN (N/A) TRANSESOPHAGEAL ECHOCARDIOGRAM (TEE) (N/A)  Total Length of Stay:  LOS: 11 days   Subjective:  No new complaints.  Pain is improved after removal of chest tube.  Still requiring oxygen.    Objective: Vital signs in last 24 hours: Temp:  [97.6 F (36.4 C)-98.4 F (36.9 C)] 98.2 F (36.8 C) (09/28 0500) Pulse Rate:  [75-86] 78 (09/27 2200) Resp:  [13-25] 19 (09/28 0800) BP: (98-153)/(49-115) 108/67 (09/28 0333) SpO2:  [85 %-100 %] 98 % (09/28 0333) Weight:  [61.6 kg] 61.6 kg (09/28 0400)  Filed Weights   01/09/21 0452 01/10/21 0700 01/11/21 0400  Weight: 91.2 kg 60.3 kg 61.6 kg    Weight change: 1.271 kg   Intake/Output from previous day: 09/27 0701 - 09/28 0700 In: 240 [P.O.:240] Out: 100   Intake/Output this shift: No intake/output data recorded.  Current Meds: Scheduled Meds:  acetaminophen  1,000 mg Oral Q6H   aspirin EC  81 mg Oral Daily   atorvastatin  80 mg Oral QHS   bisacodyl  10 mg Oral Daily   Or   bisacodyl  10 mg Rectal Daily   Chlorhexidine Gluconate Cloth  6 each Topical Daily   clopidogrel  75 mg Oral Daily   docusate sodium  200 mg Oral Daily   enoxaparin (LOVENOX) injection  40 mg Subcutaneous QHS   FLUoxetine  40 mg Oral Daily   furosemide  40 mg Oral Daily   gabapentin  300 mg Oral Daily   insulin aspart  0-24 Units Subcutaneous TID AC & HS   insulin detemir  14 Units Subcutaneous BID   metoprolol tartrate  12.5 mg Oral BID   Or   metoprolol tartrate  12.5 mg Per Tube BID   midodrine  10 mg Oral TID WC   pantoprazole  40 mg Oral Daily   potassium chloride  40 mEq Oral Daily   sodium chloride flush  3 mL Intravenous  Q12H   Continuous Infusions: PRN Meds:.dextrose, metoprolol tartrate, midazolam, morphine injection, ondansetron (ZOFRAN) IV, oxyCODONE, sodium chloride flush, traMADol  General appearance: alert, cooperative, and no distress Heart: regular rate and rhythm Lungs: diminished breath sounds bibasilar Abdomen: soft, non-tender; bowel sounds normal; no masses,  no organomegaly Extremities: edema trace Wound: clean and dry  Lab Results: CBC: Recent Labs    01/10/21 0823 01/11/21 0045  WBC 8.5 8.1  HGB 9.9* 9.3*  HCT 30.2* 28.4*  PLT 188 213   BMET:  Recent Labs    01/10/21 0823 01/11/21 0045  NA 135 136  K 3.8 4.3  CL 102 102  CO2 25 26  GLUCOSE 187* 203*  BUN 14 19  CREATININE 0.84 0.83  CALCIUM 8.6* 8.6*    CMET: Lab Results  Component Value Date   WBC 8.1 01/11/2021   HGB 9.3 (L) 01/11/2021   HCT 28.4 (L) 01/11/2021   PLT 213 01/11/2021   GLUCOSE 203 (H) 01/11/2021   CHOL 115 12/31/2020   TRIG 84 12/31/2020   HDL 46 12/31/2020   LDLDIRECT 57 01/13/2019  LDLCALC 52 12/31/2020   ALT 19 12/31/2020   AST 18 12/31/2020   NA 136 01/11/2021   K 4.3 01/11/2021   CL 102 01/11/2021   CREATININE 0.83 01/11/2021   BUN 19 01/11/2021   CO2 26 01/11/2021   TSH 1.444 12/31/2020   INR 1.3 (H) 01/06/2021   HGBA1C 8.9 (H) 12/31/2020      PT/INR: No results for input(s): LABPROT, INR in the last 72 hours. Radiology: No results found.   Assessment/Plan: S/P Procedure(s) (LRB): CORONARY ARTERY BYPASS GRAFTING (CABG), ON PUMP, TIMES TWO USING LEFT INTERNAL MAMMARY ARTERY AND RIGHT ENDOSCOPICALLY HARVESTED GREATER SAPHENOUS VEIN (N/A) TRANSESOPHAGEAL ECHOCARDIOGRAM (TEE) (N/A)  CV- NSR with PVCs- BP stable- on Lopressor 25 mg BID, Midodrine 10 mg TID, will wean as BP improves Pulm- unable to wean oxygen, will continue to wean as tolerated, I have asked cardiac rehab to perform a walk test to possibly qualify for home oxygen Renal- creatinine is WNL, weight in compute  is not accurate, she remains volume overloaded, continue Lasix, potassium Expected post operative blood loss anemia, Hgb 9.3 stable DM- sugars mostly controlled Dispo- patient stable, continue to wean oxygen, but may require home use, will await walk test, continue diuretics, patient is awaiting bed on 4E   Erin Barrett, PA-C 01/11/2021 8:16 AM  Agree with above. Overall doing well. Currently on 2 L of oxygen.  We will hopefully diurese more. On dispo planning.  Anne-Marie Genson Keane Scrape

## 2021-01-11 NOTE — Progress Notes (Signed)
SATURATION QUALIFICATIONS: (This note is used to comply with regulatory documentation for home oxygen)  Patient Saturations on Room Air at Rest = 93%  Patient Saturations on Room Air while Ambulating = 85%  Patient Saturations on 2 Liters of oxygen while Ambulating = 94%  Please briefly explain why patient needs home oxygen: yes, pt need oxygen to maintain sats while ambulating.

## 2021-01-11 NOTE — TOC Initial Note (Signed)
Transition of Care [facility]) - Initial/Assessment Note    Patient Details  Name: Sonia Morales MRN: [id number] Date of Birth: June 12, 1955  Transition of Care [facility]) CM/SW Contact:    Harriet Masson, RN Phone Number: 01/11/2021, 3:59 PM  Clinical Narrative:              Orders for home 02. Spoke to patient and gave choice. Patient agreeable to in house provider, [facility]. Spoke to Burke with [employer] and referral accepted.   Portable tank to be delivered to room for discharge needs. No other needs noted.    Expected Discharge Plan: Home/Self Care Barriers to Discharge: Barriers Resolved   Patient Goals and CMS Choice Patient states their goals for this hospitalization and ongoing recovery are:: return home CMS Medicare.gov Compare Post Acute Care list provided to:: Patient Choice offered to / list presented to : Patient  Expected Discharge Plan and Services Expected Discharge Plan: Home/Self Care   Discharge Planning Services: CM Consult Post Acute Care Choice: Durable Medical Equipment Living arrangements for the past 2 months: Single Family Home                 DME Arranged: Oxygen DME Agency: [employer] Date DME Agency Contacted: 01/11/21 Time DME Agency Contacted: 1556 Representative spoke with at DME Agency: Zack            Prior Living Arrangements/Services Living arrangements for the past 2 months: Single Family Home Lives with:: Self                   Activities of Daily Living Home Assistive Devices/Equipment: None ADL Screening (condition at time of admission) Patient's cognitive ability adequate to safely complete daily activities?: Yes Is the patient deaf or have difficulty hearing?: No Does the patient have difficulty seeing, even when wearing glasses/contacts?: No Does the patient have difficulty concentrating, remembering, or making decisions?: No Patient able to express need for assistance with ADLs?: No Does the patient have difficulty dressing or  bathing?: No Independently performs ADLs?: Yes (appropriate for developmental age) Does the patient have difficulty walking or climbing stairs?: Yes Weakness of Legs: None Weakness of Arms/Hands: None  Permission Sought/Granted   Permission granted to share information with : Yes, Verbal Permission Granted     Permission granted to share info w AGENCY: DME        Emotional Assessment              Admission diagnosis:  Pulmonary edema [J81.1] NSTEMI (non-ST elevated myocardial infarction) (HCC) [I21.4] Acute on chronic systolic (congestive) heart failure (HCC) [I50.23] 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.1] Patient Active Problem List   Diagnosis Date Noted   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   Cardiogenic shock (HCC)    Unstable angina (HCC)    Acute on chronic systolic heart failure (HCC) 12/31/2020   Acute on chronic systolic (congestive) heart failure (HCC) 12/31/2020   Dyspnea on exertion 01/07/2019   Abnormal nuclear stress test 01/07/2019   DM (diabetes mellitus), type 2, uncontrolled with complications (HCC) 06/12/2018   Hypercholesteremia 06/12/2018   H/O tobacco use, presenting hazards to health 06/12/2018   CAD (coronary artery disease), native coronary artery 10/11/2015   Acute myocardial infarction of anterior wall (HCC) 10/04/2015   PCP:  Richardean Chimera, MD Pharmacy:   [facility] - [place], [place] - [address] [place] [address] Phone: [phone] Fax: [phone]     Social Determinants of Health (SDOH) Interventions Food Insecurity  Interventions: Intervention Not Indicated Financial Strain Interventions: Intervention Not Indicated Housing Interventions: Intervention Not Indicated Transportation Interventions: Intervention Not Indicated  Readmission Risk Interventions No flowsheet data found.

## 2021-01-11 NOTE — Progress Notes (Signed)
Patients family turning off alarms on monitor again.  I entered the room and explained why they cannot mess with our cardiac monitors.  I also addressed with them that masks are required even in the patients room.

## 2021-01-11 NOTE — Progress Notes (Signed)
Observed patients husband turning off red alarm on monitor in the room from camera at charge desk.  I entered the room and educated him on why he should not touch monitors.

## 2021-01-11 NOTE — Progress Notes (Signed)
CARDIAC REHAB PHASE I   PRE:  Rate/Rhythm: 76 SR  BP:  Sitting: 120/63      SaO2: 93 RA  MODE:  Ambulation: 370 ft   POST:  Rate/Rhythm: 95 SR  BP:  Sitting: 114/83    SaO2: 85 RA --> 94 2L --> 95 RA   Pt ambulated 393ft in hallway standby assist with front wheel walker. Pt sats decreased on RA during ambulation, see ambulating O2 note. Pt returned to recliner. D/c education completed with pt and husband with granddaughter via the phone. Pt educated on importance of site care and monitoring incisions daily. Encouraged continued IS use, walks, and sternal precautions. Pt given in-the-tube sheet along with heart healthy and diabetic diets. Reviewed restrictions and exercise guidelines. Will refer to CRP II [place]. Pt hopeful to d/c today. Will f/u for questions if pt in-house tomorrow.  [id number] Reynold Bowen, RN BSN 01/11/2021 2:43 PM

## 2021-01-11 NOTE — Progress Notes (Signed)
      [address]       [place] [address]             [phone]      5 Days Post-Op  Procedure(s) (LRB): CORONARY ARTERY BYPASS GRAFTING (CABG), ON PUMP, TIMES TWO USING LEFT INTERNAL MAMMARY ARTERY AND RIGHT ENDOSCOPICALLY HARVESTED GREATER SAPHENOUS VEIN (N/A) TRANSESOPHAGEAL ECHOCARDIOGRAM (TEE) (N/A)   Total Length of Stay:  LOS: 11 days    SUBJECTIVE:  Vitals:   01/11/21 1200 01/11/21 1300  BP:    Pulse: 69 81  Resp: 20 14  Temp:    SpO2: 90% 90%    Intake/Output      09/27 0701 09/28 0700 09/28 0701 09/29 0700   P.O. 240    Blood     Total Intake(mL/kg) 240 (3.9)    Urine (mL/kg/hr) 0 (0)    Other 100    Chest Tube     Total Output 100    Net +140         Urine Occurrence 1 x 2 x   Stool Occurrence  1 x        CBC    Component Value Date/Time   WBC 8.1 01/11/2021 0045   RBC 3.05 (L) 01/11/2021 0045   HGB 9.3 (L) 01/11/2021 0045   HGB 10.8 (L) 01/13/2019 1349   HCT 28.4 (L) 01/11/2021 0045   HCT 33.6 (L) 01/13/2019 1349   PLT 213 01/11/2021 0045   PLT 222 01/13/2019 1349   MCV 93.1 01/11/2021 0045   MCV 88 01/13/2019 1349   MCH 30.5 01/11/2021 0045   MCHC 32.7 01/11/2021 0045   RDW 13.6 01/11/2021 0045   RDW 12.4 01/13/2019 1349   LYMPHSABS 1.8 12/31/2020 2115   MONOABS 0.7 12/31/2020 2115   EOSABS 0.0 12/31/2020 2115   BASOSABS 0.0 12/31/2020 2115   CMP     Component Value Date/Time   NA 136 01/11/2021 0045   NA 137 01/13/2019 1349   K 4.3 01/11/2021 0045   CL 102 01/11/2021 0045   CO2 26 01/11/2021 0045   GLUCOSE 203 (H) 01/11/2021 0045   BUN 19 01/11/2021 0045   BUN 24 (A) 08/31/2020 0000   CREATININE 0.83 01/11/2021 0045   CALCIUM 8.6 (L) 01/11/2021 0045   PROT 6.6 12/31/2020 2115   PROT 6.7 01/13/2019 1349   ALBUMIN 3.5 12/31/2020 2115   ALBUMIN 4.1 01/13/2019 1349   AST 18 12/31/2020 2115   ALT 19 12/31/2020 2115   ALKPHOS 59 12/31/2020 2115   BILITOT 0.6 12/31/2020 2115   BILITOT <0.2  01/13/2019 1349   GFRNONAA >60 01/11/2021 0045   GFRAA 61 01/13/2019 1349   ABG    Component Value Date/Time   PHART 7.352 01/06/2021 1940   PCO2ART 42.0 01/06/2021 1940   PO2ART 61 (L) 01/06/2021 1940   HCO3 23.2 01/06/2021 1940   TCO2 24 01/06/2021 1940   ACIDBASEDEF 2.0 01/06/2021 1940   O2SAT 89.0 01/06/2021 1940   CBG (last 3)  Recent Labs    01/11/21 0637 01/11/21 0849 01/11/21 1139  GLUCAP 155* 270* 214*    ASSESSMENT:  Just ambulated with cardiac rehab, did well... Sats were 85 on RA, placed order for home oxygen  Maintaining NSR, awaiting bed on 4E  Possibly for d/c in AM if remains stable  Lowella Dandy, PA-C 01/11/21

## 2021-01-11 NOTE — Progress Notes (Signed)
Heart Failure Nurse Navigator Progress Note  Visited pt to assess needs post op prior to DC. Pt stated she had none, briefly reviewed HF education. Reminded about prior HV TOC appt upon DC scheduled for 10/4. Pt states cardiology has appt scheduled for 10/13. Pt stated cardiologist stated "I don't need to see anyone else". Navigator encouraged both appts to benefit the patient post-hospitalization, medication optimization and social support/medication assistance. Canceled appt at pt request.   Ozella Rocks, MSN, RN Heart Failure Nurse Navigator [phone]

## 2021-01-11 NOTE — Plan of Care (Signed)

## 2021-01-12 ENCOUNTER — Encounter (HOSPITAL_COMMUNITY): Payer: Medicare Other

## 2021-01-12 LAB — BASIC METABOLIC PANEL
Anion gap: 9 (ref 5–15)
BUN: 18 mg/dL (ref 8–23)
CO2: 25 mmol/L (ref 22–32)
Calcium: 8.8 mg/dL — ABNORMAL LOW (ref 8.9–10.3)
Chloride: 101 mmol/L (ref 98–111)
Creatinine, Ser: 0.95 mg/dL (ref 0.44–1.00)
GFR, Estimated: >60 mL/min (ref >60)
Glucose, Bld: 240 mg/dL — ABNORMAL HIGH (ref 70–99)
Potassium: 4.2 mmol/L (ref 3.5–5.1)
Sodium: 135 mmol/L (ref 135–145)

## 2021-01-12 LAB — GLUCOSE, CAPILLARY: Glucose-Capillary: 178 mg/dL — ABNORMAL HIGH (ref 70–99)

## 2021-01-12 MED ORDER — METOPROLOL TARTRATE 25 MG PO TABS: 12.5000 mg | ORAL_TABLET | Freq: Two times a day (BID) | ORAL | 3 refills | Status: DC

## 2021-01-12 MED ORDER — CLOPIDOGREL BISULFATE 75 MG PO TABS: 75.0000 mg | ORAL_TABLET | Freq: Every day | ORAL | 3 refills | Status: DC

## 2021-01-12 MED ORDER — FUROSEMIDE 40 MG PO TABS: 40.0000 mg | ORAL_TABLET | Freq: Every day | ORAL | 0 refills | Status: DC

## 2021-01-12 MED ORDER — MIDODRINE HCL 10 MG PO TABS: 10.0000 mg | ORAL_TABLET | Freq: Two times a day (BID) | ORAL | 1 refills | Status: DC

## 2021-01-12 MED ORDER — ASPIRIN 81 MG PO TBEC: 81.0000 mg | DELAYED_RELEASE_TABLET | Freq: Every day | ORAL | 11 refills | Status: DC

## 2021-01-12 MED ORDER — POTASSIUM CHLORIDE CRYS ER 20 MEQ PO TBCR: 20.0000 meq | EXTENDED_RELEASE_TABLET | Freq: Every day | ORAL | 0 refills | Status: DC

## 2021-01-12 NOTE — Progress Notes (Signed)
      [address]       [place] [address]             [phone]      6 Days Post-Op Procedure(s) (LRB): CORONARY ARTERY BYPASS GRAFTING (CABG), ON PUMP, TIMES TWO USING LEFT INTERNAL MAMMARY ARTERY AND RIGHT ENDOSCOPICALLY HARVESTED GREATER SAPHENOUS VEIN (N/A) TRANSESOPHAGEAL ECHOCARDIOGRAM (TEE) (N/A)  Subjective:  Patient has no complaints.  Asking how soon she can get out of here.  + ambulation   Objective: Vital signs in last 24 hours: Temp:  [97.9 F (36.6 C)-98.4 F (36.9 C)] 97.9 F (36.6 C) (09/29 0645) Pulse Rate:  [69-98] 98 (09/29 0450) Cardiac Rhythm: Normal sinus rhythm (09/28 2000) Resp:  [9-25] 18 (09/29 0450) BP: (108-143)/(60-110) 131/60 (09/29 0450) SpO2:  [90 %-97 %] 91 % (09/29 0450) Weight:  [87.4 kg] 87.4 kg (09/29 0600)  Intake/Output from previous day: 09/28 0701 - 09/29 0700 In: 240 [P.O.:240] Out: -   General appearance: alert, cooperative, and no distress Heart: regular rate and rhythm Lungs: clear to auscultation bilaterally Abdomen: soft, non-tender; bowel sounds normal; no masses,  no organomegaly Extremities: edema trace Wound: clean and dry  Lab Results: Recent Labs    01/10/21 0823 01/11/21 0045  WBC 8.5 8.1  HGB 9.9* 9.3*  HCT 30.2* 28.4*  PLT 188 213   BMET:  Recent Labs    01/11/21 0045 01/12/21 0026  NA 136 135  K 4.3 4.2  CL 102 101  CO2 26 25  GLUCOSE 203* 240*  BUN 19 18  CREATININE 0.83 0.95  CALCIUM 8.6* 8.8*    PT/INR: No results for input(s): LABPROT, INR in the last 72 hours. ABG    Component Value Date/Time   PHART 7.352 01/06/2021 1940   HCO3 23.2 01/06/2021 1940   TCO2 24 01/06/2021 1940   ACIDBASEDEF 2.0 01/06/2021 1940   O2SAT 89.0 01/06/2021 1940   CBG (last 3)  Recent Labs    01/11/21 1537 01/11/21 2105 01/12/21 0648  GLUCAP 142* 283* 178*    Assessment/Plan: S/P Procedure(s) (LRB): CORONARY ARTERY BYPASS GRAFTING (CABG), ON PUMP, TIMES TWO USING LEFT  INTERNAL MAMMARY ARTERY AND RIGHT ENDOSCOPICALLY HARVESTED GREATER SAPHENOUS VEIN (N/A) TRANSESOPHAGEAL ECHOCARDIOGRAM (TEE) (N/A)  CV- NSR, BP improved up to 140 this morning- continue Lopressor at 12.5 mg BID, will decrease Midodrine to 10 mg BID Pulm- no acute issues, continues to desaturate with ambulation, home oxygen has been arranged Renal- creatinine is normal at 0.95, K is at 4.2, continue Lasix, and potassium for next 7 days Dm- sugars mostly controlled resume home insulin regimen with close follow up with PCP Dispo- patient stable, she is medically stable for discharge home today   LOS: 12 days    Lowella Dandy, PA-C 01/12/2021

## 2021-01-12 NOTE — Progress Notes (Signed)
Pt ambulated to bathroom without oxygen on. Pt became SOB, O2 checked, 82 %, 2 liters placed on pt and oxygen increased to 94%.

## 2021-01-12 NOTE — Progress Notes (Signed)
CARDIAC REHAB PHASE I   F/ with pt and spouse. Pt denies questions/concerns. Anxious to d/c. Reinforced importance of site care, restrictions, and exercise guidelines. Referred to CRP II [place]. Waiting on home O2.   [id number] Reynold Bowen, RN BSN 01/12/2021 8:26 AM

## 2021-01-12 NOTE — Discharge Instructions (Signed)
Information about your medication: Plavix (anti-platelet agent)  Generic Name (Brand): clopidogrel (Plavix), once daily medication  PURPOSE: You are taking this medication along with aspirin to lower your chance of having a heart attack, stroke, or blood clots in your heart stent. These can be fatal. Plavix and aspirin help prevent platelets from sticking together and forming a clot that can block an artery.   Common SIDE EFFECTS you may experience include: bruising or bleeding more easily, shortness of breath  Do not stop taking PLAVIX without talking to the doctor who prescribes it for you. People who are treated with a stent and stop taking Plavix too soon, have a higher risk of getting a blood clot in the stent, having a heart attack, or dying. If you stop Plavix because of bleeding, or for other reasons, your risk of a heart attack or stroke may increase.   Avoid taking NSAID agents or anti-inflammatory medications such as ibuprofen, naproxen given increased bleed risk with plavix - can use acetaminophen (Tylenol) if needed for pain.  Avoid taking over the counter stomach medications omeprazole (Prilosec) or esomeprazole (Nexium) since these do interact and make plavix less effective - ask your pharmacist or doctor for alterative agents if needed for heartburn or GERD.   Tell all of your doctors and dentists that you are taking Plavix. They should talk to the doctor who prescribed Plavix for you before you have any surgery or invasive procedure.   Contact your health care provider if you experience: severe or uncontrollable bleeding, pink/red/brown urine, vomiting blood or vomit that looks like "coffee grounds", red or black stools (looks like tar), coughing up blood or blood clots ----------------------------------------------------------------------------------------------------------------------

## 2021-01-13 ENCOUNTER — Telehealth: Payer: Medicare Other | Admitting: Thoracic Surgery (Cardiothoracic Vascular Surgery)

## 2021-01-17 ENCOUNTER — Encounter (HOSPITAL_COMMUNITY): Payer: Medicare Other

## 2021-01-18 DIAGNOSIS — G894 Chronic pain syndrome: Secondary | ICD-10-CM | POA: Diagnosis not present

## 2021-01-18 DIAGNOSIS — M47812 Spondylosis without myelopathy or radiculopathy, cervical region: Secondary | ICD-10-CM | POA: Diagnosis not present

## 2021-01-18 DIAGNOSIS — G4700 Insomnia, unspecified: Secondary | ICD-10-CM | POA: Diagnosis not present

## 2021-01-18 DIAGNOSIS — M47816 Spondylosis without myelopathy or radiculopathy, lumbar region: Secondary | ICD-10-CM | POA: Diagnosis not present

## 2021-01-23 ENCOUNTER — Ambulatory Visit: Payer: Medicare Other | Admitting: Nurse Practitioner

## 2021-01-23 DIAGNOSIS — I10 Essential (primary) hypertension: Secondary | ICD-10-CM

## 2021-01-23 DIAGNOSIS — R300 Dysuria: Secondary | ICD-10-CM | POA: Diagnosis not present

## 2021-01-23 DIAGNOSIS — E782 Mixed hyperlipidemia: Secondary | ICD-10-CM

## 2021-01-23 DIAGNOSIS — F17210 Nicotine dependence, cigarettes, uncomplicated: Secondary | ICD-10-CM | POA: Diagnosis not present

## 2021-01-23 DIAGNOSIS — E1122 Type 2 diabetes mellitus with diabetic chronic kidney disease: Secondary | ICD-10-CM

## 2021-01-25 NOTE — Progress Notes (Signed)
Primary Physician/Referring:  Caryl Bis, MD  Patient ID: Sonia Morales, female    DOB: Apr 28, 1955, 65 y.o.   MRN: [id number]  Chief Complaint  Patient presents with   Coronary Artery Disease   Follow-up   Leg Swelling   HPI:    Sonia Morales  is a 65 y.o. Caucasian female  with CAD, history of LAD stenting in 2017, tobacco use disorder in the past quit since angioplasty, asymptomatic bilateral carotid artery stenosis, hypertension, hyperlipidemia, uncontrolled diabetes mellitus insulin dependent and ischemic cardiomyopathy.  Patient underwent nuclear stress testing in September 2020 which revealed large anterior and apical and inferior transmural scar with moderate amount of peri-infarct ischemia and EF of 29%.   Patient presented to [facility] on 12/30/2020 with acute hypoxic respiratory failure and pulmonary edema, she was subsequently transferred to [facility] where she subsequently underwent cardiac catheterization revealing severe left main and LCx stenosis.  She therefore underwent CABG x2 (LIMA-LAD, SVG-OM) by Dr. Kipp Brood.  She was diuresed following surgery and weaned off pressors and discharged with aspirin and Plavix.  She has follow-up scheduled with Dr. Kipp Brood tomorrow.  Patient states overall she is feeling well, just recuperating slowly.  Her primary concern is bilateral feet and ankle swelling for the last 1 week.  She was discharged with Lasix 40 mg once daily for 7 days, however she has noticed worsening swelling since running out of this medication.  She also reports chest pain, particularly with deep breaths.  This pain is in the central chest.  When I entered the room patient was having mild nosebleed which resolved about 10 minutes into our office visit today.  Denies blood in her urine or stool.  Patient is accompanied by her husband at today's office visit.  Past Medical History:  Diagnosis Date   Acute anterior wall MI ([place]) 10/04/2015   Acute MI  anterior wall first episode care [facility]) 10/04/2015   Coronary angiogram 10/04/2015: Occluded proximal LAD, superior takeoff of ramus intermediate, large circumflex, moderate to large RCA with mild diffuse disease. S/P thrombectomy, overlapping stents to the proximal LAD with 3.5 x 18, 3.5 x 12 mm Xience DES, 100% to 0%.    CAD (coronary artery disease), native coronary artery 10/11/2015   Coronary angiogram 10/04/2015: Left main mild calcification. LAD tortuous origin, occluded in the proximal segment. Diagonal 2 is moderate-sized, has proximal 50% stenosis. S/P thrombectomy & DES overlapping 3.5 x 18 and 3.5 x 12 mm Xience Alpine stents,   Ramus intermediate has a tortuous origin. Circumflex mild disease. Right coronary artery mild diffuse disease   Colitis    DM (diabetes mellitus), type 2, uncontrolled with complications 0/31/5945   H/O tobacco use, presenting hazards to health 06/12/2018   Quit in 2017 after MI   Hypercholesteremia 06/12/2018   Past Surgical History:  Procedure Laterality Date   CARDIAC CATHETERIZATION N/A 10/04/2015   Procedure: Left Heart Cath and Coronary Angiography;  Surgeon: Adrian Prows, MD;  Location: [place] [facility];  Service: Cardiovascular;  Laterality: N/A;   CARDIAC CATHETERIZATION  10/04/2015   Procedure: Coronary Stent Intervention;  Surgeon: Adrian Prows, MD;  Location: [place] [facility];  Service: Cardiovascular;;   CORONARY ARTERY BYPASS GRAFT N/A 01/06/2021   Procedure: CORONARY ARTERY BYPASS GRAFTING (CABG), ON PUMP, TIMES TWO USING LEFT INTERNAL MAMMARY ARTERY AND RIGHT ENDOSCOPICALLY HARVESTED GREATER SAPHENOUS VEIN;  Surgeon: Lajuana Matte, MD;  Location: [place];  Service: Open Heart Surgery;  Laterality: N/A;  IABP INSERTION N/A 01/05/2021   Procedure: IABP INSERTION;  Surgeon: Adrian Prows, MD;  Location: [place] [facility];  Service: Cardiovascular;  Laterality: N/A;   PERIPHERAL VASCULAR CATHETERIZATION  10/04/2015   Procedure: Thrombectomy;  Surgeon: Adrian Prows, MD;  Location: [place] [facility];  Service: Cardiovascular;;   RIGHT/LEFT HEART CATH AND CORONARY ANGIOGRAPHY N/A 01/02/2021   Procedure: RIGHT/LEFT HEART CATH AND CORONARY ANGIOGRAPHY;  Surgeon: Nigel Mormon, MD;  Location: [place] [facility];  Service: Cardiovascular;  Laterality: N/A;   TEE WITHOUT CARDIOVERSION N/A 01/06/2021   Procedure: TRANSESOPHAGEAL ECHOCARDIOGRAM (TEE);  Surgeon: Lajuana Matte, MD;  Location: [place];  Service: Open Heart Surgery;  Laterality: N/A;   Family History  Problem Relation Age of Onset   Heart attack Mother    Cancer Brother    Social History   Tobacco Use   Smoking status: Former    Packs/day: 1.00    Years: 30.00    Pack years: 30.00    Types: Cigarettes    Quit date: 2017    Years since quitting: 5.7   Smokeless tobacco: Never  Substance Use Topics   Alcohol use: Never   Marital Status: Married   ROS  Review of Systems  Constitutional: Negative for malaise/fatigue and weight gain.  Cardiovascular:  Positive for leg swelling. Negative for chest pain, claudication, dyspnea on exertion, near-syncope, orthopnea, palpitations, paroxysmal nocturnal dyspnea and syncope.  Respiratory:  Negative for shortness of breath.   Hematologic/Lymphatic: Does not bruise/bleed easily.  Musculoskeletal:  Positive for back pain, joint pain and neck pain.  Gastrointestinal:  Negative for constipation and melena.  Neurological:  Positive for paresthesias (feet). Negative for dizziness and weakness.  Psychiatric/Behavioral:  Positive for depression.   All other systems reviewed and are negative. Objective   Vitals with BMI 01/26/2021 01/12/2021 01/12/2021  Height _0  - -  Weight 196 lbs 13 oz - -  BMI 02.77 - -  Systolic 412 878 676  Diastolic 49 83 72  Pulse 71 102 98    Orthostatic VS for the past 72 hrs (Last 3 readings):  Patient Position BP Location Cuff Size  01/26/21 1010 Sitting Left Arm Large    Physical Exam Vitals  reviewed.  Constitutional:      General: She is not in acute distress.    Appearance: She is well-developed.     Comments: Mildly obese  HENT:     Head: Normocephalic and atraumatic.  Cardiovascular:     Rate and Rhythm: Normal rate and regular rhythm.     Pulses:          Carotid pulses are  on the right side with bruit and  on the left side with bruit.      Femoral pulses are 1+ on the right side and 1+ on the left side with bruit.      Popliteal pulses are 2+ on the right side and 2+ on the left side.       Dorsalis pedis pulses are 2+ on the right side and 1+ on the left side.       Posterior tibial pulses are 0 on the right side and 0 on the left side.     Heart sounds: S1 normal and S2 normal. Murmur heard.  Midsystolic murmur is present with a grade of 2/6 at the upper right sternal border.    No gallop.     Comments: No edema. Neck scar from cervical fusion left seen. No  JVD. Sternotomy incision healing well without evidence of erythema, swelling, drainage.  Bilateral vein graft incisions healing well on calf. Pulmonary:     Effort: Pulmonary effort is normal. No respiratory distress.     Breath sounds: Normal breath sounds. No wheezing, rhonchi or rales.  Musculoskeletal:     Right lower leg: No edema.     Left lower leg: No edema.  Skin:    General: Skin is warm and dry.  Neurological:     Mental Status: She is alert.   Laboratory examination:    Recent Labs    01/10/21 0823 01/11/21 0045 01/12/21 0026  NA 135 136 135  K 3.8 4.3 4.2  CL 102 102 101  CO2 _0 GLUCOSE 187* 203* 240*  BUN _1 CREATININE 0.84 0.83 0.95  CALCIUM 8.6* 8.6* 8.8*  GFRNONAA >60 >60 >60   estimated creatinine clearance is 64.3 mL/min (by C-G formula based on SCr of 0.95 mg/dL).  CMP Latest Ref Rng & Units 01/12/2021 01/11/2021 01/10/2021  Glucose 70 - 99 mg/dL 240(H) 203(H) 187(H)  BUN 8 - 23 mg/dL _2 Creatinine 0.44 - 1.00 mg/dL 0.95 0.83 0.84  Sodium 135 - 145  mmol/L 135 136 135  Potassium 3.5 - 5.1 mmol/L 4.2 4.3 3.8  Chloride 98 - 111 mmol/L 101 102 102  CO2 22 - 32 mmol/L _3 Calcium 8.9 - 10.3 mg/dL 8.8(L) 8.6(L) 8.6(L)  Total Protein 6.5 - 8.1 g/dL - - -  Total Bilirubin 0.3 - 1.2 mg/dL - - -  Alkaline Phos 38 - 126 U/L - - -  AST 15 - 41 U/L - - -  ALT 0 - 44 U/L - - -   CBC Latest Ref Rng & Units 01/11/2021 01/10/2021 01/09/2021  WBC 4.0 - 10.5 K/uL 8.1 8.5 -  Hemoglobin 12.0 - 15.0 g/dL 9.3(L) 9.9(L) 9.7(L)  Hematocrit 36.0 - 46.0 % 28.4(L) 30.2(L) 28.7(L)  Platelets 150 - 400 K/uL 213 188 -   Lipid Panel     Component Value Date/Time   CHOL 115 12/31/2020 2115   CHOL 121 01/13/2019 1349   TRIG 84 12/31/2020 2115   HDL 46 12/31/2020 2115   HDL 46 01/13/2019 1349   CHOLHDL 2.5 12/31/2020 2115   VLDL 17 12/31/2020 2115   LDLCALC 52 12/31/2020 2115   LDLCALC 59 01/13/2019 1349   LDLDIRECT 57 01/13/2019 1349   HEMOGLOBIN A1C Lab Results  Component Value Date   HGBA1C 8.9 (H) 12/31/2020   MPG 208.73 12/31/2020   TSH Recent Labs    12/31/20 2034  TSH 1.444    External labs:  02/12/2020: HDL 51, total cholesterol 115, triglycerides 58, calculated LDL 52.4 A1c 13.4% BUN 13, creatinine 0.73, EGFR 89  A1C 11.600 % 11/10/2018 Allergies  No Known Allergies    Medications Prior to Visit:   Outpatient Medications Prior to Visit  Medication Sig Dispense Refill   Accu-Chek Softclix Lancets lancets Use as instructed to monitor glucose 4 times daily 100 each 12   aspirin EC 81 MG EC tablet Take 1 tablet (81 mg total) by mouth daily. Swallow whole. 30 tablet 11   atorvastatin (LIPITOR) 80 MG tablet Take 1 tablet (80 mg total) by mouth daily at 6 PM. (Patient taking differently: Take 80 mg by mouth at bedtime.) 30 tablet 0   clopidogrel (PLAVIX) 75 MG tablet Take 1 tablet (75 mg total) by mouth daily. 30 tablet 3   FLUoxetine (  PROZAC) 40 MG capsule Take 40 mg by mouth daily.     gabapentin (NEURONTIN) 300 MG capsule  Take 300 mg by mouth at bedtime.     glucose blood (ACCU-CHEK GUIDE) test strip Use as instructed to monitor glucose 4 times daily 100 each 12   insulin aspart protamine- aspart (NOVOLOG MIX 70/30) (70-30) 100 UNIT/ML injection Inject 40 Units into the skin 2 (two) times daily. Inject 50 units with breakfast and 60 units with supper if glucose is above 90 and she is eating     metoprolol tartrate (LOPRESSOR) 25 MG tablet Take 0.5 tablets (12.5 mg total) by mouth 2 (two) times daily. 30 tablet 3   midodrine (PROAMATINE) 10 MG tablet Take 1 tablet (10 mg total) by mouth 2 (two) times daily with a meal. 60 tablet 1   Multiple Vitamin (MULTIVITAMIN) tablet Take 1 tablet by mouth daily.     Oxycodone HCl 10 MG TABS Take 10-20 mg by mouth See admin instructions. Five times daily PRN for pain     Prasterone, DHEA, (DHEA 50 PO) Take 1 capsule by mouth daily.     sulfamethoxazole-trimethoprim (BACTRIM DS) 800-160 MG tablet Take 1 tablet by mouth 2 (two) times daily.     traZODone (DESYREL) 50 MG tablet Take 50 mg by mouth at bedtime.      furosemide (LASIX) 40 MG tablet Take 1 tablet (40 mg total) by mouth daily. 7 tablet 0   potassium chloride SA (KLOR-CON) 20 MEQ tablet Take 1 tablet (20 mEq total) by mouth daily. 7 tablet 0   nitroGLYCERIN (NITROSTAT) 0.4 MG SL tablet Place 1 tablet (0.4 mg total) under the tongue every 5 (five) minutes as needed for up to 25 days for chest pain. 25 tablet 3   No facility-administered medications prior to visit.   Final Medications at End of Visit    Current Meds  Medication Sig   Accu-Chek Softclix Lancets lancets Use as instructed to monitor glucose 4 times daily   aspirin EC 81 MG EC tablet Take 1 tablet (81 mg total) by mouth daily. Swallow whole.   atorvastatin (LIPITOR) 80 MG tablet Take 1 tablet (80 mg total) by mouth daily at 6 PM. (Patient taking differently: Take 80 mg by mouth at bedtime.)   clopidogrel (PLAVIX) 75 MG tablet Take 1 tablet (75 mg total) by  mouth daily.   FLUoxetine (PROZAC) 40 MG capsule Take 40 mg by mouth daily.   gabapentin (NEURONTIN) 300 MG capsule Take 300 mg by mouth at bedtime.   glucose blood (ACCU-CHEK GUIDE) test strip Use as instructed to monitor glucose 4 times daily   insulin aspart protamine- aspart (NOVOLOG MIX 70/30) (70-30) 100 UNIT/ML injection Inject 40 Units into the skin 2 (two) times daily. Inject 50 units with breakfast and 60 units with supper if glucose is above 90 and she is eating   metoprolol tartrate (LOPRESSOR) 25 MG tablet Take 0.5 tablets (12.5 mg total) by mouth 2 (two) times daily.   midodrine (PROAMATINE) 10 MG tablet Take 1 tablet (10 mg total) by mouth 2 (two) times daily with a meal.   Multiple Vitamin (MULTIVITAMIN) tablet Take 1 tablet by mouth daily.   Oxycodone HCl 10 MG TABS Take 10-20 mg by mouth See admin instructions. Five times daily PRN for pain   Prasterone, DHEA, (DHEA 50 PO) Take 1 capsule by mouth daily.   sulfamethoxazole-trimethoprim (BACTRIM DS) 800-160 MG tablet Take 1 tablet by mouth 2 (two) times daily.  traZODone (DESYREL) 50 MG tablet Take 50 mg by mouth at bedtime.    [DISCONTINUED] furosemide (LASIX) 40 MG tablet Take 1 tablet (40 mg total) by mouth daily.   [DISCONTINUED] potassium chloride SA (KLOR-CON) 20 MEQ tablet Take 1 tablet (20 mEq total) by mouth daily.   Radiology:  No results found.   Cardiac Studies:   Coronary angiogram 10/04/2015:  Left main mild calcification. LAD tortuous origin, occluded in the proximal segment. Diagonal 2 is moderate-sized, has proximal 50% stenosis. S/P thrombectomy & DES overlapping 3.5 x 18 and 3.5 x 12 mm Xience Alpine stents, 100% reduced to 0%. Ramus intermediate has a tortuous origin. Circumflex mild disease. Right coronary artery mild diffuse disease. Markedly elevated LVEDP, 31 mmHg.  Lower Extremity Arterial Duplex 01/19/2019: No hemodynamically significant stenoses are identified in the bilateral lower extremity  arterial system.  This exam reveals normal perfusion of the right (ABI 1.04) and left (ABI 1.00) lower extremity. Mildly abnormal waveform at the level of the ankle suggests mild small vessel disease.  Lexiscan Myoview Stress Test 12/29/2018: Lexiscan stress test was performed. Stress EKG is non-diagnostic, as this is pharmacological stress test. Perfusion images reveal a very large size anterior, anteroseptal, apical and apical inferior transmural scar with moderate amount of ischemia especially at the basal septal region from base to mid ventricle.  Dynamic gated images reveal anterior, anteroseptal and apical akinesis. Stress LV EF is severely dysfunctional 29%.  High risk study. No prior studies for comparision.  Echocardiogram 03/29/2020:  Mildly depressed LV systolic function with visual EF 45-50%. Left  ventricle cavity is normal in size. Normal global wall motion. Doppler  evidence of grade I (impaired) diastolic dysfunction, elevated LAP.  No significant valvular abnormalities.  Compared to prior study dated 04/14/2019 no significant change.  Carotid artery duplex 08/30/2020:  Stenosis in the right internal carotid artery (50-69%). Stenosis in the  right external carotid artery (<50%).  Stenosis in the left internal carotid artery (16-49%). Stenosis in the  left external carotid artery (<50%).  Antegrade right vertebral artery flow. Antegrade left vertebral artery flow.  No significant change since 02/05/2020. Follow up in six months is  appropriate if clinically indicated.  Right and left heart cath 01/02/2021: LM: Distal LM bifurcation calcific 95% stenosis (1,0,1) LAD: Patent ostial LAD stent         Mid diffuse 30% disease Lcx: Ostial calcific 80% stenosis RCA: Mid 20% disease   Severe distal LM stenosis (1,0,1) Compensated ischemic cardiomyopathy  CABG x2 01/06/2021: Dr. Kipp Brood LIMA-LAD SVG-OM  EKG  01/26/2021: Sinus rhythm at a rate of 70 bpm.  Left axis, left  anterior fascicular block.  Poor refreshing, cannot exclude anteroseptal infarct old.  LVH with secondary ST-T wave changes.  EKG 09/06/2020: Sinus rhythm at a rate of 77 bpm.  Left atrial enlargement.  Left axis, left anterior fascicular block.  Poor R wave progression, cannot exclude anteroseptal infarct old.  LVH by voltage criteria with secondary ST-T wave changes, cannot exclude lateral ischemia.  Compared to EKG 02/25/2020, ST-T wave changes more prominent in V6.  EKG 02/25/2020: Sinus rhythm at a rate of 84 bpm, left atrial enlargement, left axis deviation, left anterior fascicular block.  Poor R wave progression, cannot exclude anterior septal infarct old.  Nonspecific T wave abnormality.  No evidence of ischemia.  Compared to EKG 07/27/2019, no significant change.  Assessment     ICD-10-CM   1. Coronary artery disease involving native coronary artery of native heart without angina pectoris  I25.10 EKG 12-Lead    2. Hx of CABG - LIMA-LAD, SVG-OM  Z95.1 EKG 12-Lead    3. Ischemic cardiomyopathy  I25.5       Meds ordered this encounter  Medications   potassium chloride SA (KLOR-CON) 20 MEQ tablet    Sig: Take 1 tablet (20 mEq total) by mouth daily.    Dispense:  30 tablet    Refill:  3   furosemide (LASIX) 40 MG tablet    Sig: Take 1 tablet (40 mg total) by mouth daily.    Dispense:  30 tablet    Refill:  3   Medications Discontinued During This Encounter  Medication Reason   potassium chloride SA (KLOR-CON) 20 MEQ tablet Reorder   furosemide (LASIX) 40 MG tablet Reorder   Recommendations:    Sonia Morales  is a 65 y.o. Caucasian female  with CAD, history of LAD stenting in 2017, tobacco use disorder in the past quit since angioplasty, asymptomatic bilateral carotid artery stenosis, hypertension, hyperlipidemia, uncontrolled diabetes mellitus insulin dependent and ischemic cardiomyopathy.  Patient underwent nuclear stress testing in September 2020 which revealed large anterior  and apical and inferior transmural scar with moderate amount of peri-infarct ischemia and EF of 29%.  Patient presented to [facility] on 12/30/2020 with acute hypoxic respiratory failure and pulmonary edema, she was subsequently transferred to [facility] where she subsequently underwent cardiac catheterization revealing severe left main and LCx stenosis.  She therefore underwent CABG x2 (LIMA-LAD, SVG-OM) by Dr. Kipp Brood.  She was diuresed following surgery and weaned off pressors and discharged with aspirin and Plavix.  She has follow-up scheduled with Dr. Kipp Brood tomorrow.  Patient is recuperating relatively well.  She is tolerating present medications without issue and although blood pressure is soft patient is relatively asymptomatic.  She does have bilateral lower leg edema, will resume Lasix 40 mg once daily for the next 1 week, then advised patient to take as needed.  Counseled patient regarding as needed dosing of Lasix, she verbalized understanding agreement.  Patient has close follow-up scheduled with CT surgery.  We will follow-up in 3 months, sooner if needed.   Alethia Berthold, PA-C 01/26/2021, 1:49 PM Office: [phone]

## 2021-01-26 ENCOUNTER — Other Ambulatory Visit: Payer: Self-pay

## 2021-01-26 ENCOUNTER — Ambulatory Visit: Payer: Medicare Other | Admitting: Cardiology

## 2021-01-26 ENCOUNTER — Ambulatory Visit: Payer: Medicare Other | Admitting: Student

## 2021-01-26 ENCOUNTER — Encounter: Payer: Self-pay | Admitting: Student

## 2021-01-26 VITALS — BP 111/49 | HR 71 | Temp 98.1°F | Ht 63.0 in | Wt 196.8 lb

## 2021-01-26 DIAGNOSIS — Z951 Presence of aortocoronary bypass graft: Secondary | ICD-10-CM | POA: Diagnosis not present

## 2021-01-26 DIAGNOSIS — I255 Ischemic cardiomyopathy: Secondary | ICD-10-CM

## 2021-01-26 DIAGNOSIS — I2510 Atherosclerotic heart disease of native coronary artery without angina pectoris: Secondary | ICD-10-CM | POA: Diagnosis not present

## 2021-01-26 MED ORDER — FUROSEMIDE 40 MG PO TABS: 40.0000 mg | ORAL_TABLET | Freq: Every day | ORAL | 3 refills | Status: DC

## 2021-01-26 MED ORDER — POTASSIUM CHLORIDE CRYS ER 20 MEQ PO TBCR: 20.0000 meq | EXTENDED_RELEASE_TABLET | Freq: Every day | ORAL | 3 refills | Status: DC

## 2021-01-27 ENCOUNTER — Ambulatory Visit (INDEPENDENT_AMBULATORY_CARE_PROVIDER_SITE_OTHER): Payer: Self-pay | Admitting: Thoracic Surgery (Cardiothoracic Vascular Surgery)

## 2021-01-27 DIAGNOSIS — Z951 Presence of aortocoronary bypass graft: Secondary | ICD-10-CM

## 2021-01-27 NOTE — Progress Notes (Signed)
     [address]       [place] [address]             [phone]       Patient: Home Provider: Office Consent for Telemedicine visit obtained.  Today's visit was completed via a real-time telehealth (see specific modality noted below). The patient/authorized person provided oral consent at the time of the visit to engage in a telemedicine encounter with the present provider at [facility]. The patient/authorized person was informed of the potential benefits, limitations, and risks of telemedicine. The patient/authorized person expressed understanding that the laws that protect confidentiality also apply to telemedicine. The patient/authorized person acknowledged understanding that telemedicine does not provide emergency services and that he or she would need to call 911 or proceed to the nearest hospital for help if such a need arose.   Total time spent in the clinical discussion 10 minutes.  Telehealth Modality: Phone visit (audio only)  I had a telephone visit with Sonia Morales.  Overall has been doing well.  Her blood pressure has been stable.  She has had some pain at the vein harvest site.  Her blood sugars have been under good control.  Will f/u in 1 months with CXR  [name]

## 2021-01-31 ENCOUNTER — Ambulatory Visit: Payer: Medicare Other | Admitting: Nurse Practitioner

## 2021-01-31 DIAGNOSIS — E782 Mixed hyperlipidemia: Secondary | ICD-10-CM

## 2021-01-31 DIAGNOSIS — I10 Essential (primary) hypertension: Secondary | ICD-10-CM

## 2021-01-31 DIAGNOSIS — N1831 Chronic kidney disease, stage 3a: Secondary | ICD-10-CM

## 2021-02-09 ENCOUNTER — Other Ambulatory Visit: Payer: Self-pay | Admitting: Physician Assistant

## 2021-02-11 DIAGNOSIS — E7849 Other hyperlipidemia: Secondary | ICD-10-CM | POA: Diagnosis not present

## 2021-02-11 DIAGNOSIS — I219 Acute myocardial infarction, unspecified: Secondary | ICD-10-CM | POA: Diagnosis not present

## 2021-02-11 DIAGNOSIS — I10 Essential (primary) hypertension: Secondary | ICD-10-CM | POA: Diagnosis not present

## 2021-02-11 DIAGNOSIS — E1165 Type 2 diabetes mellitus with hyperglycemia: Secondary | ICD-10-CM | POA: Diagnosis not present

## 2021-02-15 DIAGNOSIS — M47812 Spondylosis without myelopathy or radiculopathy, cervical region: Secondary | ICD-10-CM | POA: Diagnosis not present

## 2021-02-15 DIAGNOSIS — G4700 Insomnia, unspecified: Secondary | ICD-10-CM | POA: Diagnosis not present

## 2021-02-15 DIAGNOSIS — G894 Chronic pain syndrome: Secondary | ICD-10-CM | POA: Diagnosis not present

## 2021-02-15 DIAGNOSIS — M47816 Spondylosis without myelopathy or radiculopathy, lumbar region: Secondary | ICD-10-CM | POA: Diagnosis not present

## 2021-02-16 ENCOUNTER — Other Ambulatory Visit: Payer: Medicare Other

## 2021-02-20 ENCOUNTER — Other Ambulatory Visit: Payer: Medicare Other

## 2021-02-21 ENCOUNTER — Other Ambulatory Visit: Payer: Self-pay | Admitting: Thoracic Surgery (Cardiothoracic Vascular Surgery)

## 2021-02-21 DIAGNOSIS — Z951 Presence of aortocoronary bypass graft: Secondary | ICD-10-CM

## 2021-02-22 ENCOUNTER — Other Ambulatory Visit: Payer: Self-pay | Admitting: Thoracic Surgery (Cardiothoracic Vascular Surgery)

## 2021-02-22 DIAGNOSIS — Z951 Presence of aortocoronary bypass graft: Secondary | ICD-10-CM

## 2021-02-23 ENCOUNTER — Other Ambulatory Visit: Payer: Self-pay

## 2021-02-23 ENCOUNTER — Ambulatory Visit (INDEPENDENT_AMBULATORY_CARE_PROVIDER_SITE_OTHER): Payer: Self-pay | Admitting: Physician Assistant

## 2021-02-23 ENCOUNTER — Ambulatory Visit
Admission: RE | Admit: 2021-02-23 | Discharge: 2021-02-23 | Disposition: A | Discharge status: Home / Self Care | Payer: Medicare Other | Source: Ambulatory Visit | Attending: Thoracic Surgery (Cardiothoracic Vascular Surgery) | Admitting: Thoracic Surgery (Cardiothoracic Vascular Surgery)

## 2021-02-23 VITALS — BP 95/61 | HR 80 | Resp 20 | Ht 60.0 in | Wt 188.0 lb

## 2021-02-23 DIAGNOSIS — I700 Atherosclerosis of aorta: Secondary | ICD-10-CM | POA: Diagnosis not present

## 2021-02-23 DIAGNOSIS — Z951 Presence of aortocoronary bypass graft: Secondary | ICD-10-CM

## 2021-02-23 DIAGNOSIS — Z981 Arthrodesis status: Secondary | ICD-10-CM | POA: Diagnosis not present

## 2021-02-23 NOTE — Progress Notes (Signed)
HPI: Patient returns for routine postoperative follow-up having undergone CABG x2 by Dr. Cliffton Asters on 01/06/2021.  She had an intra-aortic balloon pump placed prior to surgery for hemodynamic instability. Following revascularization, she was quite stable hemodynamically allowing for removal of the intra-aortic balloon pump on the first postoperative day.  She went on to make a progressive and uneventful recovery and was discharged home on postop day 6. Since hospital discharge the patient reports she has continued to make progress recovery.  She has been seen in follow-up by Elvin So, PA-C and is also had a virtual follow-up visit with Dr. Cliffton Asters. Ms. Lucien.  She has continued to progress since her discharge home.  She has some mild soreness and some bleeding in her skin over the left anterior chest wall.  She and her husband have been monitoring her blood pressure at home and had consistent readings around 110 systolic.   Current Outpatient Medications  Medication Sig Dispense Refill   Accu-Chek Softclix Lancets lancets Use as instructed to monitor glucose 4 times daily 100 each 12   aspirin EC 81 MG EC tablet Take 1 tablet (81 mg total) by mouth daily. Swallow whole. 30 tablet 11   atorvastatin (LIPITOR) 80 MG tablet Take 1 tablet (80 mg total) by mouth daily at 6 PM. (Patient taking differently: Take 80 mg by mouth at bedtime.) 30 tablet 0   clopidogrel (PLAVIX) 75 MG tablet Take 1 tablet (75 mg total) by mouth daily. 30 tablet 3   FLUoxetine (PROZAC) 40 MG capsule Take 40 mg by mouth daily.     furosemide (LASIX) 40 MG tablet Take 1 tablet (40 mg total) by mouth daily. 30 tablet 3   gabapentin (NEURONTIN) 300 MG capsule Take 300 mg by mouth at bedtime.     glucose blood (ACCU-CHEK GUIDE) test strip Use as instructed to monitor glucose 4 times daily 100 each 12   insulin aspart protamine- aspart (NOVOLOG MIX 70/30) (70-30) 100 UNIT/ML injection Inject 40 Units into the skin 2 (two)  times daily. Inject 50 units with breakfast and 60 units with supper if glucose is above 90 and she is eating     metoprolol tartrate (LOPRESSOR) 25 MG tablet Take 0.5 tablets (12.5 mg total) by mouth 2 (two) times daily. 30 tablet 3   midodrine (PROAMATINE) 10 MG tablet Take 1 tablet (10 mg total) by mouth 2 (two) times daily with a meal. 60 tablet 1   Multiple Vitamin (MULTIVITAMIN) tablet Take 1 tablet by mouth daily.     nitroGLYCERIN (NITROSTAT) 0.4 MG SL tablet Place 1 tablet (0.4 mg total) under the tongue every 5 (five) minutes as needed for up to 25 days for chest pain. 25 tablet 3   Oxycodone HCl 10 MG TABS Take 10-20 mg by mouth See admin instructions. Five times daily PRN for pain     potassium chloride SA (KLOR-CON) 20 MEQ tablet Take 1 tablet (20 mEq total) by mouth daily. 30 tablet 3   Prasterone, DHEA, (DHEA 50 PO) Take 1 capsule by mouth daily.     sulfamethoxazole-trimethoprim (BACTRIM DS) 800-160 MG tablet Take 1 tablet by mouth 2 (two) times daily.     traZODone (DESYREL) 50 MG tablet Take 50 mg by mouth at bedtime.      No current facility-administered medications for this visit.    Physical Exam: Vital signs: BP 95/61 Pulse 80 Respirations 20 SaO2 is 90% on room air  Heart: Regular rate and rhythm Chest: Breath sounds are clear  to auscultation.  Sternum is stable.  The chest incision is healing with no sign of complication.  Chest x-ray today shows lung fields to be clear and fully expanded.  There are no effusions. Extremities no edema.  Lower extremity incisions are healing with no sign of complication.  Diagnostic Tests: CLINICAL DATA:  Post 2 vessel CABG   EXAM: CHEST - 2 VIEW   COMPARISON:  01/11/2021   FINDINGS: Upper normal size of cardiac silhouette post CABG.   No acute abnormalities.   Mediastinal contours and pulmonary vascularity normal.   Atherosclerotic calcification aorta.   Lungs clear.   No pulmonary infiltrate, pleural effusion, or  pneumothorax.   Prior cervical fusion.   IMPRESSION: No acute abnormalities.     Electronically Signed   By: Ulyses Southward M.D.   On: 02/23/2021 15:00    Impression / Plan:  Patient is continues to make steady progress following two-vessel coronary bypass grafting in September of this year.  She remains on midodrine 10 mg twice daily.  I asked her to reduce the dose to 1/2 tablet twice daily until her current prescription is depleted then stop.  Other medications reviewed and no changes are made from our standpoint.  She is encouraged to gradually advance her activity while observing sternal precautions.  She may resume driving at this point.  Referral was made to cardiac rehab. She has scheduled follow-up with Dr. Verl Dicker office and is also scheduled to have an echocardiogram tomorrow. She can follow-up with [place] as needed.   Leary Roca, PA-C [facility] [phone]

## 2021-02-23 NOTE — Progress Notes (Signed)
      [address]       [place] [address]             [phone]       

## 2021-02-23 NOTE — Progress Notes (Signed)
Cardiac Rehab - referral placed

## 2021-02-23 NOTE — Patient Instructions (Signed)
May resume driving  Reduce the midodrine to 1/2 tablet twice daily until the current prescription is depleted in stop  Advance activity we will continue to observe sternal precautions with no lifting greater than 15 pounds other month.  Follow-up as needed

## 2021-02-24 ENCOUNTER — Other Ambulatory Visit: Payer: Medicare Other

## 2021-02-24 ENCOUNTER — Encounter: Payer: Medicare Other | Admitting: Thoracic Surgery (Cardiothoracic Vascular Surgery)

## 2021-03-02 ENCOUNTER — Ambulatory Visit: Payer: Medicare Other | Admitting: Cardiology

## 2021-03-02 ENCOUNTER — Other Ambulatory Visit: Payer: Medicare Other

## 2021-03-15 DIAGNOSIS — M47816 Spondylosis without myelopathy or radiculopathy, lumbar region: Secondary | ICD-10-CM | POA: Diagnosis not present

## 2021-03-15 DIAGNOSIS — E039 Hypothyroidism, unspecified: Secondary | ICD-10-CM | POA: Diagnosis not present

## 2021-03-15 DIAGNOSIS — K2100 Gastro-esophageal reflux disease with esophagitis, without bleeding: Secondary | ICD-10-CM | POA: Diagnosis not present

## 2021-03-15 DIAGNOSIS — G4700 Insomnia, unspecified: Secondary | ICD-10-CM | POA: Diagnosis not present

## 2021-03-15 DIAGNOSIS — M47812 Spondylosis without myelopathy or radiculopathy, cervical region: Secondary | ICD-10-CM | POA: Diagnosis not present

## 2021-03-15 DIAGNOSIS — E782 Mixed hyperlipidemia: Secondary | ICD-10-CM | POA: Diagnosis not present

## 2021-03-15 DIAGNOSIS — J441 Chronic obstructive pulmonary disease with (acute) exacerbation: Secondary | ICD-10-CM | POA: Diagnosis not present

## 2021-03-15 DIAGNOSIS — G894 Chronic pain syndrome: Secondary | ICD-10-CM | POA: Diagnosis not present

## 2021-03-15 DIAGNOSIS — E1165 Type 2 diabetes mellitus with hyperglycemia: Secondary | ICD-10-CM | POA: Diagnosis not present

## 2021-03-15 DIAGNOSIS — I10 Essential (primary) hypertension: Secondary | ICD-10-CM | POA: Diagnosis not present

## 2021-03-16 ENCOUNTER — Other Ambulatory Visit: Payer: Medicare Other

## 2021-03-21 ENCOUNTER — Ambulatory Visit: Payer: Medicare Other

## 2021-03-21 ENCOUNTER — Other Ambulatory Visit: Payer: Self-pay

## 2021-03-21 DIAGNOSIS — I6523 Occlusion and stenosis of bilateral carotid arteries: Secondary | ICD-10-CM

## 2021-03-23 ENCOUNTER — Encounter (HOSPITAL_COMMUNITY)
Admission: RE | Admit: 2021-03-23 | Discharge: 2021-03-23 | Disposition: A | Discharge status: Home / Self Care | Payer: Medicare Other | Source: Ambulatory Visit | Attending: Thoracic Surgery (Cardiothoracic Vascular Surgery) | Admitting: Thoracic Surgery (Cardiothoracic Vascular Surgery)

## 2021-03-23 ENCOUNTER — Other Ambulatory Visit: Payer: Self-pay

## 2021-03-23 VITALS — BP 105/52 | HR 73 | Ht 63.0 in | Wt 187.2 lb

## 2021-03-23 DIAGNOSIS — Z951 Presence of aortocoronary bypass graft: Secondary | ICD-10-CM

## 2021-03-23 NOTE — Progress Notes (Signed)
Cardiac Individual Treatment Plan  Patient Details  Name: Sonia Morales MRN: [id number] Date of Birth: 1955/10/29 Referring Provider:   Flowsheet Row CARDIAC REHAB PHASE II ORIENTATION from 03/23/2021 in [place]  Referring Provider Dr. Kipp Brood       Initial Encounter Date:  Flowsheet Row CARDIAC REHAB PHASE II ORIENTATION from 03/23/2021 in [place]  Date 03/23/21       Visit Diagnosis: S/P CABG x 2  Patient's Home Medications on Admission:  Current Outpatient Medications:    aspirin EC 81 MG EC tablet, Take 1 tablet (81 mg total) by mouth daily. Swallow whole., Disp: 30 tablet, Rfl: 11   atorvastatin (LIPITOR) 80 MG tablet, Take 1 tablet (80 mg total) by mouth daily at 6 PM. (Patient taking differently: Take 80 mg by mouth at bedtime.), Disp: 30 tablet, Rfl: 0   clopidogrel (PLAVIX) 75 MG tablet, Take 1 tablet (75 mg total) by mouth daily., Disp: 30 tablet, Rfl: 3   diclofenac Sodium (VOLTAREN) 1 % GEL, Apply 1 application topically daily as needed (pain)., Disp: , Rfl:    FLUoxetine (PROZAC) 40 MG capsule, Take 80 mg by mouth daily., Disp: , Rfl:    gabapentin (NEURONTIN) 300 MG capsule, Take 300 mg by mouth at bedtime., Disp: , Rfl:    insulin aspart protamine- aspart (NOVOLOG MIX 70/30) (70-30) 100 UNIT/ML injection, Inject 40 Units into the skin 2 (two) times daily., Disp: , Rfl:    metoprolol tartrate (LOPRESSOR) 25 MG tablet, Take 0.5 tablets (12.5 mg total) by mouth 2 (two) times daily., Disp: 30 tablet, Rfl: 3   midodrine (PROAMATINE) 10 MG tablet, Take 1 tablet (10 mg total) by mouth 2 (two) times daily with a meal. (Patient taking differently: Take 5 mg by mouth 2 (two) times daily with a meal.), Disp: 60 tablet, Rfl: 1   Multiple Vitamin (MULTIVITAMIN) tablet, Take 1 tablet by mouth daily., Disp: , Rfl:    nitroGLYCERIN (NITROSTAT) 0.4 MG SL tablet, Place 0.4 mg under the tongue every 5 (five) minutes as needed for chest  pain., Disp: , Rfl:    Oxycodone HCl 10 MG TABS, Take 10 mg by mouth 5 (five) times daily as needed (pain)., Disp: , Rfl:    Prasterone, DHEA, (DHEA 50 PO), Take 1 capsule by mouth daily., Disp: , Rfl:    traZODone (DESYREL) 50 MG tablet, Take 50 mg by mouth at bedtime. , Disp: , Rfl:    Accu-Chek Softclix Lancets lancets, Use as instructed to monitor glucose 4 times daily, Disp: 100 each, Rfl: 12   furosemide (LASIX) 40 MG tablet, Take 1 tablet (40 mg total) by mouth daily. (Patient not taking: Reported on 03/20/2021), Disp: 30 tablet, Rfl: 3   glucose blood (ACCU-CHEK GUIDE) test strip, Use as instructed to monitor glucose 4 times daily, Disp: 100 each, Rfl: 12   potassium chloride SA (KLOR-CON) 20 MEQ tablet, Take 1 tablet (20 mEq total) by mouth daily. (Patient not taking: Reported on 03/20/2021), Disp: 30 tablet, Rfl: 3  Past Medical History: Past Medical History:  Diagnosis Date   Acute anterior wall MI ([place]) 10/04/2015   Acute MI anterior wall first episode care [facility]) 10/04/2015   Coronary angiogram 10/04/2015: Occluded proximal LAD, superior takeoff of ramus intermediate, large circumflex, moderate to large RCA with mild diffuse disease. S/P thrombectomy, overlapping stents to the proximal LAD with 3.5 x 18, 3.5 x 12 mm Xience DES, 100% to 0%.    CAD (coronary artery disease),  native coronary artery 10/11/2015   Coronary angiogram 10/04/2015: Left main mild calcification. LAD tortuous origin, occluded in the proximal segment. Diagonal 2 is moderate-sized, has proximal 50% stenosis. S/P thrombectomy & DES overlapping 3.5 x 18 and 3.5 x 12 mm Xience Alpine stents,   Ramus intermediate has a tortuous origin. Circumflex mild disease. Right coronary artery mild diffuse disease   Colitis    DM (diabetes mellitus), type 2, uncontrolled with complications [id number]   H/O tobacco use, presenting hazards to health 06/12/2018   Quit in 2017 after MI   Hypercholesteremia 06/12/2018    Tobacco Use: Social  History   Tobacco Use  Smoking Status Former   Packs/day: 1.00   Years: 30.00   Pack years: 30.00   Types: Cigarettes   Quit date: 2017   Years since quitting: 5.9  Smokeless Tobacco Never    Labs: Recent Review Scientist, physiological     Labs for ITP Cardiac and Pulmonary Rehab Latest Ref Rng & Units 01/06/2021 01/06/2021 01/06/2021 01/06/2021 01/06/2021   Cholestrol 0 - 200 mg/dL - - - - -   LDLCALC 0 - 99 mg/dL - - - - -   LDLDIRECT 0 - 99 mg/dL - - - - -   HDL >40 mg/dL - - - - -   Trlycerides <150 mg/dL - - - - -   Hemoglobin A1c 4.8 - 5.6 % - - - - -   PHART 7.350 - 7.450 - 7.429 7.344(L) 7.331(L) 7.352   PCO2ART 32.0 - 48.0 mmHg - 36.4 42.0 40.2 42.0   HCO3 20.0 - 28.0 mmol/L - 24.1 23.0 21.4 23.2   TCO2 22 - 32 mmol/L 24 25 24 23 24    ACIDBASEDEF 0.0 - 2.0 mmol/L - - 3.0(H) 4.0(H) 2.0   O2SAT % - 100.0 95.0 93.0 89.0       Capillary Blood Glucose: Lab Results  Component Value Date   GLUCAP 178 (H) 01/12/2021   GLUCAP 283 (H) 01/11/2021   GLUCAP 142 (H) 01/11/2021   GLUCAP 214 (H) 01/11/2021   GLUCAP 270 (H) 01/11/2021    POCT Glucose     Row Name 03/23/21 1113             POCT Blood Glucose   Pre-Exercise 248 mg/dL                Exercise Target Goals: Exercise Program Goal: Individual exercise prescription set using results from initial 6 min walk test and THRR while considering  patient's activity barriers and safety.   Exercise Prescription Goal: Starting with aerobic activity 30 plus minutes a day, 3 days per week for initial exercise prescription. Provide home exercise prescription and guidelines that participant acknowledges understanding prior to discharge.  Activity Barriers & Risk Stratification:  Activity Barriers & Cardiac Risk Stratification - 03/23/21 0904       Activity Barriers & Cardiac Risk Stratification   Activity Barriers Arthritis;Back Problems;Neck/Spine Problems;Deconditioning;Shortness of Breath;History of Falls    Cardiac  Risk Stratification High             6 Minute Walk:  6 Minute Walk     Row Name 03/23/21 1019         6 Minute Walk   Phase Initial     Distance 1350 feet     Walk Time 6 minutes     # of Rest Breaks 0     MPH 2.6     METS 3.14     RPE 13  VO2 Peak 10.97     Symptoms Yes (comment)     Comments slight SOB     Resting HR 73 bpm     Resting BP 108/52     Resting Oxygen Saturation  96 %     Exercise Oxygen Saturation  during 6 min walk 92 %     Max Ex. HR 96 bpm     Max Ex. BP 155/65     2 Minute Post BP 120/58              Oxygen Initial Assessment:   Oxygen Re-Evaluation:   Oxygen Discharge (Final Oxygen Re-Evaluation):   Initial Exercise Prescription:  Initial Exercise Prescription - 03/23/21 1000       Date of Initial Exercise RX and Referring Provider   Date 03/23/21    Referring Provider Dr. Cliffton Asters    Expected Discharge Date 06/16/21      NuStep   Level 1    SPM 60    Minutes 22      Elliptical   Level 1    Speed 60    Minutes 17      Prescription Details   Frequency (times per week) 3    Duration Progress to 30 minutes of continuous aerobic without signs/symptoms of physical distress      Intensity   THRR 40-80% of Max Heartrate 63-126    Ratings of Perceived Exertion 11-13    Perceived Dyspnea 0-4      Resistance Training   Training Prescription Yes    Weight 3    Reps 10-15             Perform Capillary Blood Glucose checks as needed.  Exercise Prescription Changes:   Exercise Comments:   Exercise Goals and Review:   Exercise Goals     Row Name 03/23/21 1022             Exercise Goals   Increase Physical Activity Yes       Intervention Provide advice, education, support and counseling about physical activity/exercise needs.;Develop an individualized exercise prescription for aerobic and resistive training based on initial evaluation findings, risk stratification, comorbidities and participant's  personal goals.       Expected Outcomes Short Term: Attend rehab on a regular basis to increase amount of physical activity.;Long Term: Add in home exercise to make exercise part of routine and to increase amount of physical activity.;Long Term: Exercising regularly at least 3-5 days a week.       Increase Strength and Stamina Yes       Intervention Provide advice, education, support and counseling about physical activity/exercise needs.;Develop an individualized exercise prescription for aerobic and resistive training based on initial evaluation findings, risk stratification, comorbidities and participant's personal goals.       Expected Outcomes Short Term: Increase workloads from initial exercise prescription for resistance, speed, and METs.;Short Term: Perform resistance training exercises routinely during rehab and add in resistance training at home;Long Term: Improve cardiorespiratory fitness, muscular endurance and strength as measured by increased METs and functional capacity ( )       Able to understand and use rate of perceived exertion (RPE) scale Yes       Intervention Provide education and explanation on how to use RPE scale       Expected Outcomes Short Term: Able to use RPE daily in rehab to express subjective intensity level;Long Term:  Able to use RPE to guide intensity level when exercising independently  Knowledge and understanding of Target Heart Rate Range (THRR) Yes       Intervention Provide education and explanation of THRR including how the numbers were predicted and where they are located for reference       Expected Outcomes Short Term: Able to state/look up THRR;Long Term: Able to use THRR to govern intensity when exercising independently;Short Term: Able to use daily as guideline for intensity in rehab       Able to check pulse independently Yes       Intervention Provide education and demonstration on how to check pulse in carotid and radial arteries.;Review the  importance of being able to check your own pulse for safety during independent exercise       Expected Outcomes Short Term: Able to explain why pulse checking is important during independent exercise;Long Term: Able to check pulse independently and accurately       Understanding of Exercise Prescription Yes       Intervention Provide education, explanation, and written materials on patient's individual exercise prescription       Expected Outcomes Long Term: Able to explain home exercise prescription to exercise independently;Short Term: Able to explain program exercise prescription                Exercise Goals Re-Evaluation :    Discharge Exercise Prescription (Final Exercise Prescription Changes):   Nutrition:  Target Goals: Understanding of nutrition guidelines, daily intake of sodium 1500mg , cholesterol 200mg , calories 30% from fat and 7% or less from saturated fats, daily to have 5 or more servings of fruits and vegetables.  Biometrics:  Pre Biometrics - 03/23/21 1023       Pre Biometrics   Height 5\' 3"  (1.6 m)    Weight 187 lb 2.7 oz (84.9 kg)    Waist Circumference 44 inches    Hip Circumference 46 inches    Waist to Hip Ratio 0.96 %    BMI (Calculated) 33.16    Triceps Skinfold 28 mm    % Body Fat 45 %    Single Leg Stand 5 seconds              Nutrition Therapy Plan and Nutrition Goals:  Nutrition Therapy & Goals - 03/23/21 1114       Intervention Plan   Intervention Prescribe, educate and counsel regarding individualized specific dietary modifications aiming towards targeted core components such as weight, hypertension, lipid management, diabetes, heart failure and other comorbidities.;Nutrition handout(s) given to patient.    Expected Outcomes Short Term Goal: Understand basic principles of dietary content, such as calories, fat, sodium, cholesterol and nutrients.;Long Term Goal: Adherence to prescribed nutrition plan.;Short Term Goal: A plan has been  developed with personal nutrition goals set during dietitian appointment.             Nutrition Assessments:  Nutrition Assessments - 03/23/21 0910       MEDFICTS Scores   Pre Score 36            MEDIFICTS Score Key: ?70 Need to make dietary changes  40-70 Heart Healthy Diet ? 40 Therapeutic Level Cholesterol Diet   Picture Your Plate Scores: 14/08/22 Unhealthy dietary pattern with much room for improvement. 41-50 Dietary pattern unlikely to meet recommendations for good health and room for improvement. 51-60 More healthful dietary pattern, with some room for improvement.  >60 Healthy dietary pattern, although there may be some specific behaviors that could be improved.    Nutrition Goals Re-Evaluation:   Nutrition  Goals Discharge (Final Nutrition Goals Re-Evaluation):   Psychosocial: Target Goals: Acknowledge presence or absence of significant depression and/or stress, maximize coping skills, provide positive support system. Participant is able to verbalize types and ability to use techniques and skills needed for reducing stress and depression.  Initial Review & Psychosocial Screening:  Initial Psych Review & Screening - 03/23/21 0908       Initial Review   Current issues with Current Depression;Current Sleep Concerns   Treated with prozac for depression, and she wakes up several times a night to urinate which disrupts her sleep     [place]? Yes    Comments Her husband, children, and grand children are her support system.      Barriers   Psychosocial barriers to participate in program The patient should benefit from training in stress management and relaxation.      Screening Interventions   Interventions Encouraged to exercise;Provide feedback about the scores to participant    Expected Outcomes Long Term goal: The participant improves quality of Life and PHQ9 Scores as seen by post scores and/or verbalization of changes;Short Term  goal: Identification and review with participant of any Quality of Life or Depression concerns found by scoring the questionnaire.             Quality of Life Scores:  Quality of Life - 03/23/21 1023       Quality of Life   Select Quality of Life      Quality of Life Scores   Health/Function Pre 15.43 %    Socioeconomic Pre 27.64 %    Psych/Spiritual Pre 21.43 %    Family Pre 30 %    GLOBAL Pre 21.32 %            Scores of 19 and below usually indicate a poorer quality of life in these areas.  A difference of  2-3 points is a clinically meaningful difference.  A difference of 2-3 points in the total score of the Quality of Life Index has been associated with significant improvement in overall quality of life, self-image, physical symptoms, and general health in studies assessing change in quality of life.  PHQ-9: Recent Review Flowsheet Data     Depression screen [facility] 2/9 03/23/2021   Decreased Interest 0   Down, Depressed, Hopeless 1   PHQ - 2 Score 1   Altered sleeping 2    Tired, decreased energy 2   Change in appetite 1   Feeling bad or failure about yourself  2   Trouble concentrating 0   Moving slowly or fidgety/restless 0   Suicidal thoughts 0   PHQ-9 Score 8   Difficult doing work/chores Somewhat difficult      Interpretation of Total Score  Total Score Depression Severity:  1-4 = Minimal depression, 5-9 = Mild depression, 10-14 = Moderate depression, 15-19 = Moderately severe depression, 20-27 = Severe depression   Psychosocial Evaluation and Intervention:  Psychosocial Evaluation - 03/23/21 1004       Psychosocial Evaluation & Interventions   Interventions Stress management education;Relaxation education;Encouraged to exercise with the program and follow exercise prescription    Comments Pt has no barriers to completing rehab. She does have depression, and she is treated with Prozac. She reports that since her CABG surgery, she has been feeling more  depressed and her PHQ-9 score was a 8. She relates this to her lack of energy and not feeling like she is able to do as  much as she previously could. She also has chronic back pain that contributes to her depression as it limits the activities that she can do. She used to be very active, but is unable to be as active now. She reports that she has a good support system between her husband, children, and grandchildren. Her goals while in the program are to lose weight and to improve her strength and stamina. She is eager to start the program.    Expected Outcomes Pt's depression will continue to be treated with Prozac and no other psychosocial issues will be identified.    Continue Psychosocial Services  No Follow up required             Psychosocial Re-Evaluation:   Psychosocial Discharge (Final Psychosocial Re-Evaluation):   Vocational Rehabilitation: Provide vocational rehab assistance to qualifying candidates.   Vocational Rehab Evaluation & Intervention:  Vocational Rehab - 03/23/21 [id number]       Initial Vocational Rehab Evaluation & Intervention   Assessment shows need for Vocational Rehabilitation No             Education: Education Goals: Education classes will be provided on a weekly basis, covering required topics. Participant will state understanding/return demonstration of topics presented.  Learning Barriers/Preferences:  Learning Barriers/Preferences - 03/23/21 [id number]       Learning Barriers/Preferences   Learning Barriers None    Learning Preferences Audio;Computer/Internet;Group Instruction;Individual Instruction;Pictoral;Skilled Demonstration;Verbal Instruction;Video;Written Material             Education Topics: Hypertension, Hypertension Reduction -Define heart disease and high blood pressure. Discus how high blood pressure affects the body and ways to reduce high blood pressure.   Exercise and Your Heart -Discuss why it is important to exercise, the  FITT principles of exercise, normal and abnormal responses to exercise, and how to exercise safely.   Angina -Discuss definition of angina, causes of angina, treatment of angina, and how to decrease risk of having angina.   Cardiac Medications -Review what the following cardiac medications are used for, how they affect the body, and side effects that may occur when taking the medications.  Medications include Aspirin, Beta blockers, calcium channel blockers, ACE Inhibitors, angiotensin receptor blockers, diuretics, digoxin, and antihyperlipidemics.   Congestive Heart Failure -Discuss the definition of CHF, how to live with CHF, the signs and symptoms of CHF, and how keep track of weight and sodium intake.   Heart Disease and Intimacy -Discus the effect sexual activity has on the heart, how changes occur during intimacy as we age, and safety during sexual activity.   Smoking Cessation / COPD -Discuss different methods to quit smoking, the health benefits of quitting smoking, and the definition of COPD.   Nutrition I: Fats -Discuss the types of cholesterol, what cholesterol does to the heart, and how cholesterol levels can be controlled.   Nutrition II: Labels -Discuss the different components of food labels and how to read food label   Heart Parts/Heart Disease and PAD -Discuss the anatomy of the heart, the pathway of blood circulation through the heart, and these are affected by heart disease.   Stress I: Signs and Symptoms -Discuss the causes of stress, how stress may lead to anxiety and depression, and ways to limit stress.   Stress II: Relaxation -Discuss different types of relaxation techniques to limit stress.   Warning Signs of Stroke / TIA -Discuss definition of a stroke, what the signs and symptoms are of a stroke, and how to identify when someone is having  stroke.   Knowledge Questionnaire Score:  Knowledge Questionnaire Score - 03/23/21 0917        Knowledge Questionnaire Score   Pre Score 14/24             Core Components/Risk Factors/Patient Goals at Admission:  Personal Goals and Risk Factors at Admission - 03/23/21 0918       Core Components/Risk Factors/Patient Goals on Admission    Weight Management Yes;Obesity;Weight Loss    Intervention Weight Management: Develop a combined nutrition and exercise program designed to reach desired caloric intake, while maintaining appropriate intake of nutrient and fiber, sodium and fats, and appropriate energy expenditure required for the weight goal.;Weight Management: Provide education and appropriate resources to help participant work on and attain dietary goals.;Weight Management/Obesity: Establish reasonable short term and long term weight goals.;Obesity: Provide education and appropriate resources to help participant work on and attain dietary goals.    Admit Weight 170 lb (77.1 kg)    Goal Weight: Long Term 150 lb (68 kg)    Expected Outcomes Short Term: Continue to assess and modify interventions until short term weight is achieved;Long Term: Adherence to nutrition and physical activity/exercise program aimed toward attainment of established weight goal;Weight Loss: Understanding of general recommendations for a balanced deficit meal plan, which promotes 1-2 lb weight loss per week and includes a negative energy balance of [phone] kcal/d;Understanding recommendations for meals to include 15-35% energy as protein, 25-35% energy from fat, 35-60% energy from carbohydrates, less than 200mg  of dietary cholesterol, 20-35 gm of total fiber daily;Understanding of distribution of calorie intake throughout the day with the consumption of 4-5 meals/snacks    Improve shortness of breath with ADL's Yes    Intervention Provide education, individualized exercise plan and daily activity instruction to help decrease symptoms of SOB with activities of daily living.    Expected Outcomes Short Term: Improve  cardiorespiratory fitness to achieve a reduction of symptoms when performing ADLs;Long Term: Be able to perform more ADLs without symptoms or delay the onset of symptoms    Diabetes Yes    Intervention Provide education about signs/symptoms and action to take for hypo/hyperglycemia.;Provide education about proper nutrition, including hydration, and aerobic/resistive exercise prescription along with prescribed medications to achieve blood glucose in normal ranges: Fasting glucose 65-99 mg/dL    Expected Outcomes Short Term: Participant verbalizes understanding of the signs/symptoms and immediate care of hyper/hypoglycemia, proper foot care and importance of medication, aerobic/resistive exercise and nutrition plan for blood glucose control.;Long Term: Attainment of HbA1C < 7%.    Hypertension Yes    Intervention Provide education on lifestyle modifcations including regular physical activity/exercise, weight management, moderate sodium restriction and increased consumption of fresh fruit, vegetables, and low fat dairy, alcohol moderation, and smoking cessation.;Monitor prescription use compliance.    Expected Outcomes Short Term: Continued assessment and intervention until BP is < 140/53mm HG in hypertensive participants. < 130/57mm HG in hypertensive participants with diabetes, heart failure or chronic kidney disease.;Long Term: Maintenance of blood pressure at goal levels.    Lipids Yes    Intervention Provide education and support for participant on nutrition & aerobic/resistive exercise along with prescribed medications to achieve LDL 70mg , HDL >40mg .    Expected Outcomes Short Term: Participant states understanding of desired cholesterol values and is compliant with medications prescribed. Participant is following exercise prescription and nutrition guidelines.;Long Term: Cholesterol controlled with medications as prescribed, with individualized exercise RX and with personalized nutrition plan. Value  goals: LDL < 70mg , HDL > 40 mg.  Personal Goal Other Yes    Personal Goal improve stamina    Intervention Attend rehab and begin home exercise.    Expected Outcomes Pt will improve her stamina to her pre CABG levels.             Core Components/Risk Factors/Patient Goals Review:    Core Components/Risk Factors/Patient Goals at Discharge (Final Review):    ITP Comments:   Comments: Patient arrived for 1st visit/orientation/education at 0800. Patient was referred to CR by Dr. Kipp Brood due to S/P CABG x 2 (Z95.1). During orientation advised patient on arrival and appointment times what to wear, what to do before, during and after exercise. Reviewed attendance and class policy.  Pt is scheduled to return Cardiac Rehab on 03/27/2021 at 0930. Pt was advised to come to class 15 minutes before class starts.  Discussed RPE/Dpysnea scales. Patient participated in warm up stretches. Patient was able to complete 6 minute walk test.  Telemetry:NSR. Patient was measured for the equipment. Discussed equipment safety with patient. Took patient pre-anthropometric measurements. Patient finished visit at [place].

## 2021-03-27 ENCOUNTER — Encounter (HOSPITAL_COMMUNITY)
Admission: RE | Admit: 2021-03-27 | Discharge: 2021-03-27 | Disposition: A | Discharge status: Home / Self Care | Payer: Medicare Other | Source: Ambulatory Visit | Attending: Thoracic Surgery (Cardiothoracic Vascular Surgery) | Admitting: Thoracic Surgery (Cardiothoracic Vascular Surgery)

## 2021-03-27 DIAGNOSIS — Z951 Presence of aortocoronary bypass graft: Secondary | ICD-10-CM

## 2021-03-27 NOTE — Progress Notes (Signed)
Daily Session Note  Patient Details  Name: Sonia Morales MRN: [id number] Date of Birth: December 21, 1955 Referring Provider:   Flowsheet Row CARDIAC REHAB PHASE II ORIENTATION from 03/23/2021 in [place]  Referring Provider Dr. Kipp Brood       Encounter Date: 03/27/2021  Check In:  Session Check In - 03/27/21 0930       Check-In   Supervising physician immediately available to respond to emergencies [facility] MD immediately available    Physician(s) Dr. Domenic Polite    Location [facility]    Staff Present Hoy Register, MS, ACSM-CEP, Exercise Physiologist;Heather Otho Ket, BS, Exercise Physiologist;Debra Wynetta Emery, RN, BSN    Virtual Visit No    Medication changes reported     No    Fall or balance concerns reported    Yes    Comments Pt has fallen once in the past year. She is unsure what caused her to fall. She injured her shoulder and went to see her PCP.    Tobacco Cessation No Change    Warm-up and Cool-down Performed as group-led instruction    Resistance Training Performed Yes    VAD Patient? No    PAD/SET Patient? No      Pain Assessment   Currently in Pain? Yes    Pain Score 6     Pain Location Back    Pain Orientation Lower    Pain Descriptors / Indicators Aching;Sharp    Pain Type Chronic pain    Pain Radiating Towards down left leg    Pain Onset More than a month ago    Pain Frequency Constant    Aggravating Factors  sitting, walking    Pain Relieving Factors oxycodone 10 mg    Multiple Pain Sites No             Capillary Blood Glucose: No results found for this or any previous visit (from the past 24 hour(s)).    Social History   Tobacco Use  Smoking Status Former   Packs/day: 1.00   Years: 30.00   Pack years: 30.00   Types: Cigarettes   Quit date: 2017   Years since quitting: 5.9  Smokeless Tobacco Never    Goals Met:  Independence with exercise equipment Exercise tolerated well No report of concerns or  symptoms today Strength training completed today  Goals Unmet:  Not Applicable  Comments: checkout time is 1   Dr. Kathie Dike is Medical Director for [facility] Pul[facility].

## 2021-03-29 ENCOUNTER — Encounter (HOSPITAL_COMMUNITY)
Admission: RE | Admit: 2021-03-29 | Discharge: 2021-03-29 | Disposition: A | Discharge status: Home / Self Care | Payer: Medicare Other | Source: Ambulatory Visit | Attending: Thoracic Surgery (Cardiothoracic Vascular Surgery) | Admitting: Thoracic Surgery (Cardiothoracic Vascular Surgery)

## 2021-03-29 DIAGNOSIS — Z951 Presence of aortocoronary bypass graft: Secondary | ICD-10-CM

## 2021-03-29 NOTE — Progress Notes (Signed)
Daily Session Note  Patient Details  Name: Sonia Morales MRN: [id number] Date of Birth: 09-Aug-1955 Referring Provider:   Flowsheet Row CARDIAC REHAB PHASE II ORIENTATION from 03/23/2021 in [facility]  Referring Provider Dr. Kipp Brood       Encounter Date: 03/29/2021  Check In:  Session Check In - 03/29/21 0930       Check-In   Supervising physician immediately available to respond to emergencies [facility] MD immediately available    Physician(s) Dr. Johney Frame    Location [facility]    Staff Present Geanie Cooley, RN;Heather Otho Ket, BS, Exercise Physiologist;Konni Kesinger Wynetta Emery, RN, BSN    Virtual Visit No    Medication changes reported     No    Fall or balance concerns reported    Yes    Comments Pt has fallen once in the past year. She is unsure what caused her to fall. She injured her shoulder and went to see her PCP.    Tobacco Cessation No Change    Warm-up and Cool-down Performed as group-led instruction    Resistance Training Performed Yes    VAD Patient? No    PAD/SET Patient? No      Pain Assessment   Currently in Pain? No/denies    Pain Score 0-No pain    Multiple Pain Sites No             Capillary Blood Glucose: No results found for this or any previous visit (from the past 24 hour(s)).    Social History   Tobacco Use  Smoking Status Former   Packs/day: 1.00   Years: 30.00   Pack years: 30.00   Types: Cigarettes   Quit date: 2017   Years since quitting: 5.9  Smokeless Tobacco Never    Goals Met:  Independence with exercise equipment Exercise tolerated well No report of concerns or symptoms today Strength training completed today  Goals Unmet:  Not Applicable  Comments: Check out 1030.   Dr. Kathie Dike is Medical Director for [facility].

## 2021-03-31 ENCOUNTER — Encounter (HOSPITAL_COMMUNITY): Payer: Medicare Other

## 2021-04-03 ENCOUNTER — Encounter (HOSPITAL_COMMUNITY): Payer: Medicare Other

## 2021-04-05 ENCOUNTER — Encounter (HOSPITAL_COMMUNITY): Payer: Medicare Other

## 2021-04-07 ENCOUNTER — Encounter (HOSPITAL_COMMUNITY): Payer: Medicare Other

## 2021-04-10 ENCOUNTER — Encounter (HOSPITAL_COMMUNITY): Payer: Medicare Other

## 2021-04-12 ENCOUNTER — Encounter (HOSPITAL_COMMUNITY): Payer: Medicare Other

## 2021-04-13 DIAGNOSIS — M47812 Spondylosis without myelopathy or radiculopathy, cervical region: Secondary | ICD-10-CM | POA: Diagnosis not present

## 2021-04-13 DIAGNOSIS — G894 Chronic pain syndrome: Secondary | ICD-10-CM | POA: Diagnosis not present

## 2021-04-13 DIAGNOSIS — M47816 Spondylosis without myelopathy or radiculopathy, lumbar region: Secondary | ICD-10-CM | POA: Diagnosis not present

## 2021-04-13 DIAGNOSIS — G4700 Insomnia, unspecified: Secondary | ICD-10-CM | POA: Diagnosis not present

## 2021-04-14 ENCOUNTER — Encounter (HOSPITAL_COMMUNITY): Payer: Medicare Other

## 2021-04-17 ENCOUNTER — Encounter (HOSPITAL_COMMUNITY): Payer: Medicare Other

## 2021-04-18 ENCOUNTER — Other Ambulatory Visit: Payer: Self-pay | Admitting: Physician Assistant

## 2021-04-19 ENCOUNTER — Encounter (HOSPITAL_COMMUNITY): Payer: Medicare Other

## 2021-04-19 NOTE — Progress Notes (Signed)
Discharge Progress Report  Patient Details  Name: Sonia Morales MRN: [id number] Date of Birth: May 25, 1955 Referring Provider:   Flowsheet Row CARDIAC REHAB PHASE II ORIENTATION from 03/23/2021 in [facility]  Referring Provider Dr. Cliffton Asters        Number of Visits: 3  Reason for Discharge:  Early Exit:  Lack of attendance  Smoking History:  Social History   Tobacco Use  Smoking Status Former   Packs/day: 1.00   Years: 30.00   Pack years: 30.00   Types: Cigarettes   Quit date: 2017   Years since quitting: 6.0  Smokeless Tobacco Never    Diagnosis:  S/P CABG x 2  ADL UCSD:   Initial Exercise Prescription:  Initial Exercise Prescription - 03/23/21 1000       Date of Initial Exercise RX and Referring Provider   Date 03/23/21    Referring Provider Dr. Cliffton Asters    Expected Discharge Date 06/16/21      NuStep   Level 1    SPM 60    Minutes 22      Elliptical   Level 1    Speed 60    Minutes 17      Prescription Details   Frequency (times per week) 3    Duration Progress to 30 minutes of continuous aerobic without signs/symptoms of physical distress      Intensity   THRR 40-80% of Max Heartrate 63-126    Ratings of Perceived Exertion 11-13    Perceived Dyspnea 0-4      Resistance Training   Training Prescription Yes    Weight 3    Reps 10-15             Discharge Exercise Prescription (Final Exercise Prescription Changes):  Exercise Prescription Changes - 03/29/21 0930       Response to Exercise   Blood Pressure (Admit) 118/50    Blood Pressure (Exercise) 120/58    Blood Pressure (Exit) 115/58    Heart Rate (Admit) 71 bpm    Heart Rate (Exercise) 87 bpm    Heart Rate (Exit) 74 bpm    Rating of Perceived Exertion (Exercise) 13    Duration Continue with 30 min of aerobic exercise without signs/symptoms of physical distress.    Intensity THRR unchanged      Progression   Progression Continue to progress workloads  to maintain intensity without signs/symptoms of physical distress.      Resistance Training   Training Prescription Yes    Weight 3    Reps 10-15    Time 10 Minutes      NuStep   Level 1    SPM 67    Minutes 22    METs 1.65      Elliptical   Level 1    Speed 34    Minutes 17    METs 1.3             Functional Capacity:  6 Minute Walk     Row Name 03/23/21 1019         6 Minute Walk   Phase Initial     Distance 1350 feet     Walk Time 6 minutes     # of Rest Breaks 0     MPH 2.6     METS 3.14     RPE 13     VO2 Peak 10.97     Symptoms Yes (comment)     Comments slight SOB  Resting HR 73 bpm     Resting BP 108/52     Resting Oxygen Saturation  96 %     Exercise Oxygen Saturation  during 6 min walk 92 %     Max Ex. HR 96 bpm     Max Ex. BP 155/65     2 Minute Post BP 120/58              Psychological, QOL, Others - Outcomes: PHQ 2/9: Depression screen PHQ 2/9 03/23/2021  Decreased Interest 0  Down, Depressed, Hopeless 1  PHQ - 2 Score 1  Altered sleeping 2  Tired, decreased energy 2  Change in appetite 1  Feeling bad or failure about yourself  2  Trouble concentrating 0  Moving slowly or fidgety/restless 0  Suicidal thoughts 0  PHQ-9 Score 8  Difficult doing work/chores Somewhat difficult    Quality of Life:  Quality of Life - 03/23/21 1023       Quality of Life   Select Quality of Life      Quality of Life Scores   Health/Function Pre 15.43 %    Socioeconomic Pre 27.64 %    Psych/Spiritual Pre 21.43 %    Family Pre 30 %    GLOBAL Pre 21.32 %             Personal Goals: Goals established at orientation with interventions provided to work toward goal.  Personal Goals and Risk Factors at Admission - 03/23/21 0918       Core Components/Risk Factors/Patient Goals on Admission    Weight Management Yes;Obesity;Weight Loss    Intervention Weight Management: Develop a combined nutrition and exercise program designed to reach  desired caloric intake, while maintaining appropriate intake of nutrient and fiber, sodium and fats, and appropriate energy expenditure required for the weight goal.;Weight Management: Provide education and appropriate resources to help participant work on and attain dietary goals.;Weight Management/Obesity: Establish reasonable short term and long term weight goals.;Obesity: Provide education and appropriate resources to help participant work on and attain dietary goals.    Admit Weight 170 lb (77.1 kg)    Goal Weight: Long Term 150 lb (68 kg)    Expected Outcomes Short Term: Continue to assess and modify interventions until short term weight is achieved;Long Term: Adherence to nutrition and physical activity/exercise program aimed toward attainment of established weight goal;Weight Loss: Understanding of general recommendations for a balanced deficit meal plan, which promotes 1-2 lb weight loss per week and includes a negative energy balance of [phone] kcal/d;Understanding recommendations for meals to include 15-35% energy as protein, 25-35% energy from fat, 35-60% energy from carbohydrates, less than  of dietary cholesterol, 20-35 gm of total fiber daily;Understanding of distribution of calorie intake throughout the day with the consumption of 4-5 meals/snacks    Improve shortness of breath with ADL's Yes    Intervention Provide education, individualized exercise plan and daily activity instruction to help decrease symptoms of SOB with activities of daily living.    Expected Outcomes Short Term: Improve cardiorespiratory fitness to achieve a reduction of symptoms when performing ADLs;Long Term: Be able to perform more ADLs without symptoms or delay the onset of symptoms    Diabetes Yes    Intervention Provide education about signs/symptoms and action to take for hypo/hyperglycemia.;Provide education about proper nutrition, including hydration, and aerobic/resistive exercise prescription along with  prescribed medications to achieve blood glucose in normal ranges: Fasting glucose 65-99 mg/dL    Expected Outcomes Short Term: Participant verbalizes  understanding of the signs/symptoms and immediate care of hyper/hypoglycemia, proper foot care and importance of medication, aerobic/resistive exercise and nutrition plan for blood glucose control.;Long Term: Attainment of HbA1C < 7%.    Hypertension Yes    Intervention Provide education on lifestyle modifcations including regular physical activity/exercise, weight management, moderate sodium restriction and increased consumption of fresh fruit, vegetables, and low fat dairy, alcohol moderation, and smoking cessation.;Monitor prescription use compliance.    Expected Outcomes Short Term: Continued assessment and intervention until BP is < 140/66mm HG in hypertensive participants. < 130/64mm HG in hypertensive participants with diabetes, heart failure or chronic kidney disease.;Long Term: Maintenance of blood pressure at goal levels.    Lipids Yes    Intervention Provide education and support for participant on nutrition & aerobic/resistive exercise along with prescribed medications to achieve LDL 70mg , HDL >40mg .    Expected Outcomes Short Term: Participant states understanding of desired cholesterol values and is compliant with medications prescribed. Participant is following exercise prescription and nutrition guidelines.;Long Term: Cholesterol controlled with medications as prescribed, with individualized exercise RX and with personalized nutrition plan. Value goals: LDL < 70mg , HDL > 40 mg.    Personal Goal Other Yes    Personal Goal improve stamina    Intervention Attend rehab and begin home exercise.    Expected Outcomes Pt will improve her stamina to her pre CABG levels.              Personal Goals Discharge:  Goals and Risk Factor Review     Row Name 04/05/21 1554             Core Components/Risk Factors/Patient Goals Review    Personal Goals Review Weight Management/Obesity;Improve shortness of breath with ADL's;Diabetes;Lipids;Hypertension;Other       Review Patient was referred to [facility] with CABGx1. She has multiple risk factors for CAD and is participating in the program for risk modification. She has completed 3 sessions but has missed the last several sessions. Her last A1C on file was 12/31/20 at 8.9% down from 10% 5 years ago. She is on insulin for DM control. Her initial weight was 187.0 and her current weight is 188.0. Her blood pressure has been well controlled. Her personal goals for the program are to lose weight and improve her strength and stamina. We will continue to monitor her progress as she works towards meeting these goals.       Expected Outcomes Patient will complete the program meeting both personal and program goals.                Exercise Goals and Review:  Exercise Goals     Row Name 03/23/21 1022             Exercise Goals   Increase Physical Activity Yes       Intervention Provide advice, education, support and counseling about physical activity/exercise needs.;Develop an individualized exercise prescription for aerobic and resistive training based on initial evaluation findings, risk stratification, comorbidities and participant's personal goals.       Expected Outcomes Short Term: Attend rehab on a regular basis to increase amount of physical activity.;Long Term: Add in home exercise to make exercise part of routine and to increase amount of physical activity.;Long Term: Exercising regularly at least 3-5 days a week.       Increase Strength and Stamina Yes       Intervention Provide advice, education, support and counseling about physical activity/exercise needs.;Develop an individualized exercise prescription for aerobic and  resistive training based on initial evaluation findings, risk stratification, comorbidities and participant's personal goals.       Expected Outcomes Short Term:  Increase workloads from initial exercise prescription for resistance, speed, and METs.;Short Term: Perform resistance training exercises routinely during rehab and add in resistance training at home;Long Term: Improve cardiorespiratory fitness, muscular endurance and strength as measured by increased METs and functional capacity ( )       Able to understand and use rate of perceived exertion (RPE) scale Yes       Intervention Provide education and explanation on how to use RPE scale       Expected Outcomes Short Term: Able to use RPE daily in rehab to express subjective intensity level;Long Term:  Able to use RPE to guide intensity level when exercising independently       Knowledge and understanding of Target Heart Rate Range (THRR) Yes       Intervention Provide education and explanation of THRR including how the numbers were predicted and where they are located for reference       Expected Outcomes Short Term: Able to state/look up THRR;Long Term: Able to use THRR to govern intensity when exercising independently;Short Term: Able to use daily as guideline for intensity in rehab       Able to check pulse independently Yes       Intervention Provide education and demonstration on how to check pulse in carotid and radial arteries.;Review the importance of being able to check your own pulse for safety during independent exercise       Expected Outcomes Short Term: Able to explain why pulse checking is important during independent exercise;Long Term: Able to check pulse independently and accurately       Understanding of Exercise Prescription Yes       Intervention Provide education, explanation, and written materials on patient's individual exercise prescription       Expected Outcomes Long Term: Able to explain home exercise prescription to exercise independently;Short Term: Able to explain program exercise prescription                Exercise Goals Re-Evaluation:   Nutrition & Weight -  Outcomes:  Pre Biometrics - 03/23/21 1023       Pre Biometrics   Height  (1.6 m)    Weight 187 lb 2.7 oz (84.9 kg)    Waist Circumference 44 inches    Hip Circumference 46 inches    Waist to Hip Ratio 0.96 %    BMI (Calculated) 33.16    Triceps Skinfold 28 mm    % Body Fat 45 %    Single Leg Stand 5 seconds              Nutrition:  Nutrition Therapy & Goals - 04/05/21 1544       Personal Nutrition Goals   Comments Patient scored 36 on  her diet assessment. We offer 2 educational sessions on heart healthy nutrition with  handouts and assistance with RD referral if patient is interested.      Intervention Plan   Intervention Nutrition handout(s) given to patient.    Expected Outcomes Short Term Goal: Understand basic principles of dietary content, such as calories, fat, sodium, cholesterol and nutrients.             Nutrition Discharge:  Nutrition Assessments - 03/23/21 0910       MEDFICTS Scores   Pre Score 36  Education Questionnaire Score:  Knowledge Questionnaire Score - 03/23/21 0917       Knowledge Questionnaire Score   Pre Score 14/24            Pt discharged from cardiac rehab due to lack of attendance. She no call/no showed 6 visits in a row. She attended 3 sessions.

## 2021-04-20 DIAGNOSIS — E039 Hypothyroidism, unspecified: Secondary | ICD-10-CM | POA: Diagnosis not present

## 2021-04-20 DIAGNOSIS — I10 Essential (primary) hypertension: Secondary | ICD-10-CM | POA: Diagnosis not present

## 2021-04-20 DIAGNOSIS — E1165 Type 2 diabetes mellitus with hyperglycemia: Secondary | ICD-10-CM | POA: Diagnosis not present

## 2021-04-20 DIAGNOSIS — E782 Mixed hyperlipidemia: Secondary | ICD-10-CM | POA: Diagnosis not present

## 2021-04-20 DIAGNOSIS — K2100 Gastro-esophageal reflux disease with esophagitis, without bleeding: Secondary | ICD-10-CM | POA: Diagnosis not present

## 2021-04-20 DIAGNOSIS — E7849 Other hyperlipidemia: Secondary | ICD-10-CM | POA: Diagnosis not present

## 2021-04-21 ENCOUNTER — Encounter (HOSPITAL_COMMUNITY): Payer: Medicare Other

## 2021-04-24 ENCOUNTER — Encounter (HOSPITAL_COMMUNITY): Payer: Medicare Other

## 2021-04-26 ENCOUNTER — Encounter (HOSPITAL_COMMUNITY): Payer: Medicare Other

## 2021-04-27 DIAGNOSIS — Z6834 Body mass index (BMI) 34.0-34.9, adult: Secondary | ICD-10-CM | POA: Diagnosis not present

## 2021-04-27 DIAGNOSIS — E1142 Type 2 diabetes mellitus with diabetic polyneuropathy: Secondary | ICD-10-CM | POA: Diagnosis not present

## 2021-04-27 DIAGNOSIS — J329 Chronic sinusitis, unspecified: Secondary | ICD-10-CM | POA: Diagnosis not present

## 2021-04-27 DIAGNOSIS — I10 Essential (primary) hypertension: Secondary | ICD-10-CM | POA: Diagnosis not present

## 2021-04-27 DIAGNOSIS — I25111 Atherosclerotic heart disease of native coronary artery with angina pectoris with documented spasm: Secondary | ICD-10-CM | POA: Diagnosis not present

## 2021-04-27 DIAGNOSIS — E1165 Type 2 diabetes mellitus with hyperglycemia: Secondary | ICD-10-CM | POA: Diagnosis not present

## 2021-04-27 DIAGNOSIS — F331 Major depressive disorder, recurrent, moderate: Secondary | ICD-10-CM | POA: Diagnosis not present

## 2021-04-27 DIAGNOSIS — E7849 Other hyperlipidemia: Secondary | ICD-10-CM | POA: Diagnosis not present

## 2021-04-28 ENCOUNTER — Other Ambulatory Visit: Payer: Self-pay

## 2021-04-28 ENCOUNTER — Ambulatory Visit: Payer: Medicare Other | Admitting: Student

## 2021-04-28 ENCOUNTER — Encounter (HOSPITAL_COMMUNITY): Payer: Medicare Other

## 2021-05-01 ENCOUNTER — Ambulatory Visit: Payer: Medicare Other | Admitting: Student

## 2021-05-01 ENCOUNTER — Encounter (HOSPITAL_COMMUNITY): Payer: Medicare Other

## 2021-05-01 NOTE — Progress Notes (Incomplete)
Primary Physician/Referring:  Caryl Bis, MD  Patient ID: Sonia Morales, female    DOB: March 13, 1956, 66 y.o.   MRN: [id number]  No chief complaint on file.  HPI:    Sonia Morales  is a 66 y.o. Caucasian female  with CAD, history of LAD stenting in 2017, tobacco use disorder in the past quit since angioplasty, asymptomatic bilateral carotid artery stenosis, hypertension, hyperlipidemia, uncontrolled diabetes mellitus insulin dependent and ischemic cardiomyopathy.  Patient underwent nuclear stress testing in September 2020 which revealed large anterior and apical and inferior transmural scar with moderate amount of peri-infarct ischemia and EF of 29%.   Patient presented to [facility] on 12/30/2020 with acute hypoxic respiratory failure and pulmonary edema, she was subsequently transferred to [facility] where she subsequently underwent cardiac catheterization revealing severe left main and LCx stenosis.  She therefore underwent CABG x2 (LIMA-LAD, SVG-OM) by Dr. Kipp Brood.  She was diuresed following surgery and weaned off pressors and discharged with aspirin and Plavix.  She has follow-up scheduled with Dr. Kipp Brood tomorrow.  Patient states overall she is feeling well, just recuperating slowly.  Her primary concern is bilateral feet and ankle swelling for the last 1 week.  She was discharged with Lasix 40 mg once daily for 7 days, however she has noticed worsening swelling since running out of this medication.  She also reports chest pain, particularly with deep breaths.  This pain is in the central chest.  When I entered the room patient was having mild nosebleed which resolved about 10 minutes into our office visit today.  Denies blood in her urine or stool.  Patient is accompanied by her husband at today's office visit.  Past Medical History:  Diagnosis Date   Acute anterior wall MI ([place]) 10/04/2015   Acute MI anterior wall first episode care [facility]) 10/04/2015   Coronary angiogram  10/04/2015: Occluded proximal LAD, superior takeoff of ramus intermediate, large circumflex, moderate to large RCA with mild diffuse disease. S/P thrombectomy, overlapping stents to the proximal LAD with 3.5 x 18, 3.5 x 12 mm Xience DES, 100% to 0%.    CAD (coronary artery disease), native coronary artery 10/11/2015   Coronary angiogram 10/04/2015: Left main mild calcification. LAD tortuous origin, occluded in the proximal segment. Diagonal 2 is moderate-sized, has proximal 50% stenosis. S/P thrombectomy & DES overlapping 3.5 x 18 and 3.5 x 12 mm Xience Alpine stents,   Ramus intermediate has a tortuous origin. Circumflex mild disease. Right coronary artery mild diffuse disease   Colitis    DM (diabetes mellitus), type 2, uncontrolled with complications 3/33/5456   H/O tobacco use, presenting hazards to health 06/12/2018   Quit in 2017 after MI   Hypercholesteremia 06/12/2018   Past Surgical History:  Procedure Laterality Date   CARDIAC CATHETERIZATION N/A 10/04/2015   Procedure: Left Heart Cath and Coronary Angiography;  Surgeon: Adrian Prows, MD;  Location: [place] [facility];  Service: Cardiovascular;  Laterality: N/A;   CARDIAC CATHETERIZATION  10/04/2015   Procedure: Coronary Stent Intervention;  Surgeon: Adrian Prows, MD;  Location: [place] [facility];  Service: Cardiovascular;;   CORONARY ARTERY BYPASS GRAFT N/A 01/06/2021   Procedure: CORONARY ARTERY BYPASS GRAFTING (CABG), ON PUMP, TIMES TWO USING LEFT INTERNAL MAMMARY ARTERY AND RIGHT ENDOSCOPICALLY HARVESTED GREATER SAPHENOUS VEIN;  Surgeon: Lajuana Matte, MD;  Location: [place];  Service: Open Heart Surgery;  Laterality: N/A;   IABP INSERTION N/A 01/05/2021   Procedure: IABP INSERTION;  Surgeon: Adrian Prows, MD;  Location: [place] [facility];  Service: Cardiovascular;  Laterality: N/A;   PERIPHERAL VASCULAR CATHETERIZATION  10/04/2015   Procedure: Thrombectomy;  Surgeon: Adrian Prows, MD;  Location: [place] [facility];  Service:  Cardiovascular;;   RIGHT/LEFT HEART CATH AND CORONARY ANGIOGRAPHY N/A 01/02/2021   Procedure: RIGHT/LEFT HEART CATH AND CORONARY ANGIOGRAPHY;  Surgeon: Nigel Mormon, MD;  Location: [place] [facility];  Service: Cardiovascular;  Laterality: N/A;   TEE WITHOUT CARDIOVERSION N/A 01/06/2021   Procedure: TRANSESOPHAGEAL ECHOCARDIOGRAM (TEE);  Surgeon: Lajuana Matte, MD;  Location: [place];  Service: Open Heart Surgery;  Laterality: N/A;   Family History  Problem Relation Age of Onset   Heart attack Mother    Cancer Brother    Social History   Tobacco Use   Smoking status: Former    Packs/day: 1.00    Years: 30.00    Pack years: 30.00    Types: Cigarettes    Quit date: 2017    Years since quitting: 6.0   Smokeless tobacco: Never  Substance Use Topics   Alcohol use: Never   Marital Status: Married   ROS  Review of Systems  Constitutional: Negative for malaise/fatigue and weight gain.  Cardiovascular:  Positive for leg swelling. Negative for chest pain, claudication, dyspnea on exertion, near-syncope, orthopnea, palpitations, paroxysmal nocturnal dyspnea and syncope.  Respiratory:  Negative for shortness of breath.   Hematologic/Lymphatic: Does not bruise/bleed easily.  Musculoskeletal:  Positive for back pain, joint pain and neck pain.  Gastrointestinal:  Negative for constipation and melena.  Neurological:  Positive for paresthesias (feet). Negative for dizziness and weakness.  Psychiatric/Behavioral:  Positive for depression.   All other systems reviewed and are negative. Objective   Vitals with BMI 03/23/2021 02/23/2021 01/26/2021  Height 5' 3"  5' 0"  5' 3"   Weight 187 lbs 3 oz 188 lbs 196 lbs 13 oz  BMI 33.16 01.02 72.53  Systolic 664 95 403  Diastolic 52 61 49  Pulse 73 80 71    No data found.   Physical Exam Vitals reviewed.  Constitutional:      General: She is not in acute distress.    Appearance: She is well-developed.     Comments: Mildly  obese  HENT:     Head: Normocephalic and atraumatic.  Cardiovascular:     Rate and Rhythm: Normal rate and regular rhythm.     Pulses:          Carotid pulses are  on the right side with bruit and  on the left side with bruit.      Femoral pulses are 1+ on the right side and 1+ on the left side with bruit.      Popliteal pulses are 2+ on the right side and 2+ on the left side.       Dorsalis pedis pulses are 2+ on the right side and 1+ on the left side.       Posterior tibial pulses are 0 on the right side and 0 on the left side.     Heart sounds: S1 normal and S2 normal. Murmur heard.  Midsystolic murmur is present with a grade of 2/6 at the upper right sternal border.    No gallop.     Comments: No edema. Neck scar from cervical fusion left seen. No JVD. Sternotomy incision healing well without evidence of erythema, swelling, drainage.  Bilateral vein graft incisions healing well on calf. Pulmonary:     Effort: Pulmonary effort is normal. No  respiratory distress.     Breath sounds: Normal breath sounds. No wheezing, rhonchi or rales.  Musculoskeletal:     Right lower leg: No edema.     Left lower leg: No edema.  Skin:    General: Skin is warm and dry.  Neurological:     Mental Status: She is alert.   Laboratory examination:    Recent Labs    01/10/21 0823 01/11/21 0045 01/12/21 0026  NA 135 136 135  K 3.8 4.3 4.2  CL 102 102 101  CO2 25 26 25   GLUCOSE 187* 203* 240*  BUN 14 19 18   CREATININE 0.84 0.83 0.95  CALCIUM 8.6* 8.6* 8.8*  GFRNONAA >60 >60 >60    CrCl cannot be calculated (Patient's most recent lab result is older than the maximum 21 days allowed.).  CMP Latest Ref Rng & Units 01/12/2021 01/11/2021 01/10/2021  Glucose 70 - 99 mg/dL 240(H) 203(H) 187(H)  BUN 8 - 23 mg/dL 18 19 14   Creatinine 0.44 - 1.00 mg/dL 0.95 0.83 0.84  Sodium 135 - 145 mmol/L 135 136 135  Potassium 3.5 - 5.1 mmol/L 4.2 4.3 3.8  Chloride 98 - 111 mmol/L 101 102 102  CO2 22 - 32  mmol/L 25 26 25   Calcium 8.9 - 10.3 mg/dL 8.8(L) 8.6(L) 8.6(L)  Total Protein 6.5 - 8.1 g/dL - - -  Total Bilirubin 0.3 - 1.2 mg/dL - - -  Alkaline Phos 38 - 126 U/L - - -  AST 15 - 41 U/L - - -  ALT 0 - 44 U/L - - -   CBC Latest Ref Rng & Units 01/11/2021 01/10/2021 01/09/2021  WBC 4.0 - 10.5 K/uL 8.1 8.5 -  Hemoglobin 12.0 - 15.0 g/dL 9.3(L) 9.9(L) 9.7(L)  Hematocrit 36.0 - 46.0 % 28.4(L) 30.2(L) 28.7(L)  Platelets 150 - 400 K/uL 213 188 -   Lipid Panel     Component Value Date/Time   CHOL 115 12/31/2020 2115   CHOL 121 01/13/2019 1349   TRIG 84 12/31/2020 2115   HDL 46 12/31/2020 2115   HDL 46 01/13/2019 1349   CHOLHDL 2.5 12/31/2020 2115   VLDL 17 12/31/2020 2115   LDLCALC 52 12/31/2020 2115   LDLCALC 59 01/13/2019 1349   LDLDIRECT 57 01/13/2019 1349   HEMOGLOBIN A1C Lab Results  Component Value Date   HGBA1C 8.9 (H) 12/31/2020   MPG 208.73 12/31/2020   TSH Recent Labs    12/31/20 2034  TSH 1.444     External labs:  02/12/2020: HDL 51, total cholesterol 115, triglycerides 58, calculated LDL 52.4 A1c 13.4% BUN 13, creatinine 0.73, EGFR 89  A1C 11.600 % 11/10/2018 Allergies  No Known Allergies    Medications Prior to Visit:   Outpatient Medications Prior to Visit  Medication Sig Dispense Refill   Accu-Chek Softclix Lancets lancets Use as instructed to monitor glucose 4 times daily 100 each 12   aspirin EC 81 MG EC tablet Take 1 tablet (81 mg total) by mouth daily. Swallow whole. 30 tablet 11   atorvastatin (LIPITOR) 80 MG tablet Take 1 tablet (80 mg total) by mouth daily at 6 PM. (Patient taking differently: Take 80 mg by mouth at bedtime.) 30 tablet 0   clopidogrel (PLAVIX) 75 MG tablet Take 1 tablet (75 mg total) by mouth daily. 30 tablet 3   diclofenac Sodium (VOLTAREN) 1 % GEL Apply 1 application topically daily as needed (pain).     FLUoxetine (PROZAC) 40 MG capsule Take 80 mg by mouth  daily.     furosemide (LASIX) 40 MG tablet Take 1 tablet  (40 mg total) by mouth daily. (Patient not taking: Reported on 03/20/2021) 30 tablet 3   gabapentin (NEURONTIN) 300 MG capsule Take 300 mg by mouth at bedtime.     glucose blood (ACCU-CHEK GUIDE) test strip Use as instructed to monitor glucose 4 times daily 100 each 12   insulin aspart protamine- aspart (NOVOLOG MIX 70/30) (70-30) 100 UNIT/ML injection Inject 40 Units into the skin 2 (two) times daily.     metoprolol tartrate (LOPRESSOR) 25 MG tablet Take 0.5 tablets (12.5 mg total) by mouth 2 (two) times daily. 30 tablet 3   midodrine (PROAMATINE) 10 MG tablet Take 1 tablet (10 mg total) by mouth 2 (two) times daily with a meal. (Patient taking differently: Take 5 mg by mouth 2 (two) times daily with a meal.) 60 tablet 1   Multiple Vitamin (MULTIVITAMIN) tablet Take 1 tablet by mouth daily.     nitroGLYCERIN (NITROSTAT) 0.4 MG SL tablet Place 0.4 mg under the tongue every 5 (five) minutes as needed for chest pain.     Oxycodone HCl 10 MG TABS Take 10 mg by mouth 5 (five) times daily as needed (pain).     potassium chloride SA (KLOR-CON) 20 MEQ tablet Take 1 tablet (20 mEq total) by mouth daily. (Patient not taking: Reported on 03/20/2021) 30 tablet 3   Prasterone, DHEA, (DHEA 50 PO) Take 1 capsule by mouth daily.     traZODone (DESYREL) 50 MG tablet Take 50 mg by mouth at bedtime.      No facility-administered medications prior to visit.   Final Medications at End of Visit    No outpatient medications have been marked as taking for the 05/01/21 encounter (Appointment) with Rayetta Pigg, Olla Delancey C, PA-C.   Radiology:  No results found.   Cardiac Studies:   Coronary angiogram 10/04/2015:  Left main mild calcification. LAD tortuous origin, occluded in the proximal segment. Diagonal 2 is moderate-sized, has proximal 50% stenosis. S/P thrombectomy & DES overlapping 3.5 x 18 and 3.5 x 12 mm Xience Alpine stents, 100% reduced to 0%. Ramus intermediate has a tortuous origin. Circumflex mild  disease. Right coronary artery mild diffuse disease. Markedly elevated LVEDP, 31 mmHg.  Lower Extremity Arterial Duplex 01/19/2019: No hemodynamically significant stenoses are identified in the bilateral lower extremity arterial system.  This exam reveals normal perfusion of the right (ABI 1.04) and left (ABI 1.00) lower extremity. Mildly abnormal waveform at the level of the ankle suggests mild small vessel disease.  Lexiscan Myoview Stress Test 12/29/2018: Lexiscan stress test was performed. Stress EKG is non-diagnostic, as this is pharmacological stress test. Perfusion images reveal a very large size anterior, anteroseptal, apical and apical inferior transmural scar with moderate amount of ischemia especially at the basal septal region from base to mid ventricle.  Dynamic gated images reveal anterior, anteroseptal and apical akinesis. Stress LV EF is severely dysfunctional 29%.  High risk study. No prior studies for comparision.  Echocardiogram 03/29/2020:  Mildly depressed LV systolic function with visual EF 45-50%. Left  ventricle cavity is normal in size. Normal global wall motion. Doppler  evidence of grade I (impaired) diastolic dysfunction, elevated LAP.  No significant valvular abnormalities.  Compared to prior study dated 04/14/2019 no significant change.  Carotid artery duplex 08/30/2020:  Stenosis in the right internal carotid artery (50-69%). Stenosis in the  right external carotid artery (<50%).  Stenosis in the left internal carotid artery (16-49%). Stenosis in  the  left external carotid artery (<50%).  Antegrade right vertebral artery flow. Antegrade left vertebral artery flow.  No significant change since 02/05/2020. Follow up in six months is  appropriate if clinically indicated.  Right and left heart cath 01/02/2021: LM: Distal LM bifurcation calcific 95% stenosis (1,0,1) LAD: Patent ostial LAD stent         Mid diffuse 30% disease Lcx: Ostial calcific 80%  stenosis RCA: Mid 20% disease   Severe distal LM stenosis (1,0,1) Compensated ischemic cardiomyopathy  CABG x2 01/06/2021: Dr. Kipp Brood LIMA-LAD SVG-OM  EKG  01/26/2021: Sinus rhythm at a rate of 70 bpm.  Left axis, left anterior fascicular block.  Poor refreshing, cannot exclude anteroseptal infarct old.  LVH with secondary ST-T wave changes.  EKG 09/06/2020: Sinus rhythm at a rate of 77 bpm.  Left atrial enlargement.  Left axis, left anterior fascicular block.  Poor R wave progression, cannot exclude anteroseptal infarct old.  LVH by voltage criteria with secondary ST-T wave changes, cannot exclude lateral ischemia.  Compared to EKG 02/25/2020, ST-T wave changes more prominent in V6.  EKG 02/25/2020: Sinus rhythm at a rate of 84 bpm, left atrial enlargement, left axis deviation, left anterior fascicular block.  Poor R wave progression, cannot exclude anterior septal infarct old.  Nonspecific T wave abnormality.  No evidence of ischemia.  Compared to EKG 07/27/2019, no significant change.  Assessment   No diagnosis found.   No orders of the defined types were placed in this encounter.  There are no discontinued medications.  Recommendations:    Sonia Morales  is a 66 y.o. Caucasian female  with CAD, history of LAD stenting in 2017, tobacco use disorder in the past quit since angioplasty, asymptomatic bilateral carotid artery stenosis, hypertension, hyperlipidemia, uncontrolled diabetes mellitus insulin dependent and ischemic cardiomyopathy.  Patient underwent nuclear stress testing in September 2020 which revealed large anterior and apical and inferior transmural scar with moderate amount of peri-infarct ischemia and EF of 29%.  Patient presented to [facility] on 12/30/2020 with acute hypoxic respiratory failure and pulmonary edema, she was subsequently transferred to [facility] where she subsequently underwent cardiac catheterization revealing severe left main and LCx stenosis.  She  therefore underwent CABG x2 (LIMA-LAD, SVG-OM) by Dr. Kipp Brood.  She was diuresed following surgery and weaned off pressors and discharged with aspirin and Plavix.  She has follow-up scheduled with Dr. Kipp Brood tomorrow.  Patient is recuperating relatively well.  She is tolerating present medications without issue and although blood pressure is soft patient is relatively asymptomatic.  She does have bilateral lower leg edema, will resume Lasix 40 mg once daily for the next 1 week, then advised patient to take as needed.  Counseled patient regarding as needed dosing of Lasix, she verbalized understanding agreement.  Patient has close follow-up scheduled with CT surgery.  We will follow-up in 3 months, sooner if needed.   Alethia Berthold, PA-C 05/01/2021, 10:11 AM Office: [phone]

## 2021-05-03 ENCOUNTER — Encounter (HOSPITAL_COMMUNITY): Payer: Medicare Other

## 2021-05-05 ENCOUNTER — Encounter (HOSPITAL_COMMUNITY): Payer: Medicare Other

## 2021-05-05 NOTE — Progress Notes (Signed)
Primary Physician/Referring:  Caryl Bis, MD  Patient ID: Sonia Morales, female    DOB: [birth year], 66 y.o.   MRN: [id number]  Chief Complaint  Patient presents with   Coronary Artery Disease   Follow-up   Chest Pain   HPI:    Sonia Morales  is a 66 y.o. Caucasian female  with CAD, history of LAD stenting in 2017, tobacco use disorder in the past quit since angioplasty, asymptomatic bilateral carotid artery stenosis, hypertension, hyperlipidemia, uncontrolled diabetes mellitus insulin dependent and ischemic cardiomyopathy.  Patient underwent nuclear stress testing in September 2020 which revealed large anterior and apical and inferior transmural scar with moderate amount of peri-infarct ischemia and EF of 29%.   Patient presented to [facility] on 12/30/2020 with acute hypoxic respiratory failure and pulmonary edema, she was subsequently transferred to [facility] where she subsequently underwent cardiac catheterization revealing severe left main and LCx stenosis.  She therefore underwent CABG x2 (LIMA-LAD, SVG-OM) by Dr. Kipp Brood.  She was diuresed following surgery and weaned off pressors and discharged with aspirin and Plavix.    Following discharge patient has had follow-up with cardiothoracic surgery who advised her to reduce midodrine to half a tablet twice daily and to stop this when her current prescription ran out.  She is released by CT surgery and recommended for as needed follow-up.  She has started cardiac rehab.  She now presents to our office for 25-monthfollow-up.  Since last office visit patient underwent carotid artery duplex which showed no significant carotid disease, therefore no further follow-up was recommended.  Patient was doing well in cardiac rehab, however she developed the flu around the end of December 2022 and was symptomatic for 2 to 3 weeks.  She is therefore not been back to cardiac rehab since.  She continues to experience tenderness to palpation along  sternotomy scar.  She also reports mild dyspnea on exertion, which was getting better when she was in cardiac rehab.  Past Medical History:  Diagnosis Date   [place]   Acute MI anterior wall first episode care ([facility] 10/04/2015   Coronary angiogram 10/04/2015: Occluded proximal LAD, superior takeoff of ramus intermediate, large circumflex, moderate to large RCA with mild diffuse disease. S/P thrombectomy, overlapping stents to the proximal LAD with 3.5 x 18, 3.5 x 12 mm Xience DES, 100% to 0%.    CAD (coronary artery disease), native coronary artery 10/11/2015   Coronary angiogram 10/04/2015: Left main mild calcification. LAD tortuous origin, occluded in the proximal segment. Diagonal 2 is moderate-sized, has proximal 50% stenosis. S/P thrombectomy & DES overlapping 3.5 x 18 and 3.5 x 12 mm Xience Alpine stents,   Ramus intermediate has a tortuous origin. Circumflex mild disease. Right coronary artery mild diffuse disease   Colitis    DM (diabetes mellitus), type 2, uncontrolled with complications 25/72/6203  H/O tobacco use, presenting hazards to health 06/12/2018   Quit in 2017 after MI   Hypercholesteremia 06/12/2018   Past Surgical History:  Procedure Laterality Date   CARDIAC CATHETERIZATION N/A 10/04/2015   Procedure: Left Heart Cath and Coronary Angiography;  Surgeon: JAdrian Prows MD;  [place] [facility];  Service: Cardiovascular;  Laterality: N/A;   CARDIAC CATHETERIZATION  10/04/2015   Procedure: Coronary Stent Intervention;  Surgeon: JAdrian Prows MD;  [place] [facility];  Service: Cardiovascular;;   CORONARY ARTERY BYPASS GRAFT N/A 01/06/2021   Procedure: CORONARY ARTERY BYPASS GRAFTING (CABG), ON PUMP,  TIMES TWO USING LEFT INTERNAL MAMMARY ARTERY AND RIGHT ENDOSCOPICALLY HARVESTED GREATER SAPHENOUS VEIN;  Surgeon: Lajuana Matte, MD;  Location: [place];  Service: Open Heart Surgery;  Laterality: N/A;   IABP INSERTION N/A 01/05/2021    Procedure: IABP INSERTION;  Surgeon: Adrian Prows, MD;  Location: [place] [facility];  Service: Cardiovascular;  Laterality: N/A;   PERIPHERAL VASCULAR CATHETERIZATION  10/04/2015   Procedure: Thrombectomy;  Surgeon: Adrian Prows, MD;  Location: [place] [facility];  Service: Cardiovascular;;   RIGHT/LEFT HEART CATH AND CORONARY ANGIOGRAPHY N/A 01/02/2021   Procedure: RIGHT/LEFT HEART CATH AND CORONARY ANGIOGRAPHY;  Surgeon: Nigel Mormon, MD;  Location: [place] [facility];  Service: Cardiovascular;  Laterality: N/A;   TEE WITHOUT CARDIOVERSION N/A 01/06/2021   Procedure: TRANSESOPHAGEAL ECHOCARDIOGRAM (TEE);  Surgeon: Lajuana Matte, MD;  Location: [place];  Service: Open Heart Surgery;  Laterality: N/A;   Family History  Problem Relation Age of Onset   Heart attack Mother    Cancer Brother    Social History   Tobacco Use   Smoking status: Former    Packs/day: 1.00    Years: 30.00    Pack years: 30.00    Types: Cigarettes    Quit date: 2017    Years since quitting: 6.0   Smokeless tobacco: Never  Substance Use Topics   Alcohol use: Never   Marital Status: Married   ROS  Review of Systems  Constitutional: Negative for malaise/fatigue and weight gain.  Cardiovascular:  Positive for chest pain (along sternotomy incision). Negative for claudication, dyspnea on exertion, leg swelling, near-syncope, orthopnea, palpitations, paroxysmal nocturnal dyspnea and syncope.  Respiratory:  Negative for shortness of breath.   Hematologic/Lymphatic: Does not bruise/bleed easily.  Gastrointestinal:  Negative for constipation and melena.  Neurological:  Positive for paresthesias (feet). Negative for dizziness and weakness.  All other systems reviewed and are negative. Objective   Vitals with BMI 05/08/2021 03/23/2021 02/23/2021  Height _0  _1  _2   Weight 180 lbs 187 lbs 3 oz 188 lbs  BMI 31.89 88.91 69.45  Systolic 038 882 95  Diastolic 69 52 61  Pulse 79 73 80    Orthostatic VS  for the past 72 hrs (Last 3 readings):  Patient Position BP Location Cuff Size  05/08/21 1116 Sitting Left Arm Normal    Physical Exam Vitals reviewed.  Constitutional:      General: She is not in acute distress.    Appearance: She is well-developed.     Comments: Mildly obese  Cardiovascular:     Rate and Rhythm: Normal rate and regular rhythm.     Pulses:          Carotid pulses are  on the right side with bruit and  on the left side with bruit.      Femoral pulses are 1+ on the right side and 1+ on the left side with bruit.      Popliteal pulses are 2+ on the right side and 2+ on the left side.       Dorsalis pedis pulses are 2+ on the right side and 1+ on the left side.       Posterior tibial pulses are 0 on the right side and 0 on the left side.     Heart sounds: S1 normal and S2 normal. Murmur heard.  Midsystolic murmur is present with a grade of 2/6 at the upper right sternal border.    No gallop.  Comments: No edema. Neck scar from cervical fusion left seen. No JVD. Sternotomy incision well healed. Bilateral vein graft incisions well healed.  Pulmonary:     Effort: Pulmonary effort is normal. No respiratory distress.     Breath sounds: Normal breath sounds. No wheezing, rhonchi or rales.  Musculoskeletal:     Right lower leg: No edema.     Left lower leg: No edema.  Skin:    General: Skin is warm and dry.   Laboratory examination:    Recent Labs    01/10/21 0823 01/11/21 0045 01/12/21 0026  NA 135 136 135  K 3.8 4.3 4.2  CL 102 102 101  CO2 _0 GLUCOSE 187* 203* 240*  BUN _1 CREATININE 0.84 0.83 0.95  CALCIUM 8.6* 8.6* 8.8*  GFRNONAA >60 >60 >60   CrCl cannot be calculated (Patient's most recent lab result is older than the maximum 21 days allowed.).  CMP Latest Ref Rng & Units 01/12/2021 01/11/2021 01/10/2021  Glucose 70 - 99 mg/dL 240(H) 203(H) 187(H)  BUN 8 - 23 mg/dL _2 Creatinine 0.44 - 1.00 mg/dL 0.95 0.83 0.84  Sodium 135 -  145 mmol/L 135 136 135  Potassium 3.5 - 5.1 mmol/L 4.2 4.3 3.8  Chloride 98 - 111 mmol/L 101 102 102  CO2 22 - 32 mmol/L _3 Calcium 8.9 - 10.3 mg/dL 8.8(L) 8.6(L) 8.6(L)  Total Protein 6.5 - 8.1 g/dL - - -  Total Bilirubin 0.3 - 1.2 mg/dL - - -  Alkaline Phos 38 - 126 U/L - - -  AST 15 - 41 U/L - - -  ALT 0 - 44 U/L - - -   CBC Latest Ref Rng & Units 01/11/2021 01/10/2021 01/09/2021  WBC 4.0 - 10.5 K/uL 8.1 8.5 -  Hemoglobin 12.0 - 15.0 g/dL 9.3(L) 9.9(L) 9.7(L)  Hematocrit 36.0 - 46.0 % 28.4(L) 30.2(L) 28.7(L)  Platelets 150 - 400 K/uL 213 188 -   Lipid Panel     Component Value Date/Time   CHOL 115 12/31/2020 2115   CHOL 121 01/13/2019 1349   TRIG 84 12/31/2020 2115   HDL 46 12/31/2020 2115   HDL 46 01/13/2019 1349   CHOLHDL 2.5 12/31/2020 2115   VLDL 17 12/31/2020 2115   LDLCALC 52 12/31/2020 2115   LDLCALC 59 01/13/2019 1349   LDLDIRECT 57 01/13/2019 1349   HEMOGLOBIN A1C Lab Results  Component Value Date   HGBA1C 8.9 (H) 12/31/2020   MPG 208.73 12/31/2020   TSH Recent Labs    12/31/20 2034  TSH 1.444    External labs:  02/12/2020: HDL 51, total cholesterol 115, triglycerides 58, calculated LDL 52.4 A1c 13.4% BUN 13, creatinine 0.73, EGFR 89  A1C 11.600 % 11/10/2018  Allergies  No Known Allergies   Medications Prior to Visit:   Outpatient Medications Prior to Visit  Medication Sig Dispense Refill   Accu-Chek Softclix Lancets lancets Use as instructed to monitor glucose 4 times daily 100 each 12   aspirin EC 81 MG EC tablet Take 1 tablet (81 mg total) by mouth daily. Swallow whole. 30 tablet 11   atorvastatin (LIPITOR) 80 MG tablet Take 1 tablet (80 mg total) by mouth daily at 6 PM. (Patient taking differently: Take 80 mg by mouth at bedtime.) 30 tablet 0   clopidogrel (PLAVIX) 75 MG tablet Take 1 tablet (75 mg total) by mouth daily. 30 tablet 3   diclofenac Sodium (VOLTAREN) 1 % GEL Apply  1 application topically daily as needed (pain).      FLUoxetine (PROZAC) 40 MG capsule Take 80 mg by mouth daily.     gabapentin (NEURONTIN) 300 MG capsule Take 300 mg by mouth at bedtime.     glucose blood (ACCU-CHEK GUIDE) test strip Use as instructed to monitor glucose 4 times daily 100 each 12   insulin aspart protamine- aspart (NOVOLOG MIX 70/30) (70-30) 100 UNIT/ML injection Inject 40 Units into the skin 2 (two) times daily.     metoprolol tartrate (LOPRESSOR) 25 MG tablet Take 0.5 tablets (12.5 mg total) by mouth 2 (two) times daily. 30 tablet 3   Multiple Vitamin (MULTIVITAMIN) tablet Take 1 tablet by mouth daily.     nitroGLYCERIN (NITROSTAT) 0.4 MG SL tablet Place 0.4 mg under the tongue every 5 (five) minutes as needed for chest pain.     Oxycodone HCl 10 MG TABS Take 10 mg by mouth 5 (five) times daily as needed (pain).     potassium chloride SA (KLOR-CON) 20 MEQ tablet Take 1 tablet (20 mEq total) by mouth daily. 30 tablet 3   Prasterone, DHEA, (DHEA 50 PO) Take 1 capsule by mouth daily.     traZODone (DESYREL) 50 MG tablet Take 50 mg by mouth at bedtime.      midodrine (PROAMATINE) 10 MG tablet Take 1 tablet (10 mg total) by mouth 2 (two) times daily with a meal. (Patient taking differently: Take 5 mg by mouth 2 (two) times daily with a meal.) 60 tablet 1   furosemide (LASIX) 40 MG tablet Take 1 tablet (40 mg total) by mouth daily. (Patient not taking: Reported on 03/20/2021) 30 tablet 3   No facility-administered medications prior to visit.   Final Medications at End of Visit    Current Meds  Medication Sig   Accu-Chek Softclix Lancets lancets Use as instructed to monitor glucose 4 times daily   aspirin EC 81 MG EC tablet Take 1 tablet (81 mg total) by mouth daily. Swallow whole.   atorvastatin (LIPITOR) 80 MG tablet Take 1 tablet (80 mg total) by mouth daily at 6 PM. (Patient taking differently: Take 80 mg by mouth at bedtime.)   clopidogrel (PLAVIX) 75 MG tablet Take 1 tablet (75 mg total) by mouth daily.   diclofenac Sodium  (VOLTAREN) 1 % GEL Apply 1 application topically daily as needed (pain).   FLUoxetine (PROZAC) 40 MG capsule Take 80 mg by mouth daily.   gabapentin (NEURONTIN) 300 MG capsule Take 300 mg by mouth at bedtime.   glucose blood (ACCU-CHEK GUIDE) test strip Use as instructed to monitor glucose 4 times daily   insulin aspart protamine- aspart (NOVOLOG MIX 70/30) (70-30) 100 UNIT/ML injection Inject 40 Units into the skin 2 (two) times daily.   metoprolol tartrate (LOPRESSOR) 25 MG tablet Take 0.5 tablets (12.5 mg total) by mouth 2 (two) times daily.   Multiple Vitamin (MULTIVITAMIN) tablet Take 1 tablet by mouth daily.   nitroGLYCERIN (NITROSTAT) 0.4 MG SL tablet Place 0.4 mg under the tongue every 5 (five) minutes as needed for chest pain.   Oxycodone HCl 10 MG TABS Take 10 mg by mouth 5 (five) times daily as needed (pain).   potassium chloride SA (KLOR-CON) 20 MEQ tablet Take 1 tablet (20 mEq total) by mouth daily.   Prasterone, DHEA, (DHEA 50 PO) Take 1 capsule by mouth daily.   traZODone (DESYREL) 50 MG tablet Take 50 mg by mouth at bedtime.    [DISCONTINUED] midodrine (PROAMATINE) 10  MG tablet Take 1 tablet (10 mg total) by mouth 2 (two) times daily with a meal. (Patient taking differently: Take 5 mg by mouth 2 (two) times daily with a meal.)   Radiology:  No results found.   Cardiac Studies:   Coronary angiogram 10/04/2015:  Left main mild calcification. LAD tortuous origin, occluded in the proximal segment. Diagonal 2 is moderate-sized, has proximal 50% stenosis. S/P thrombectomy & DES overlapping 3.5 x 18 and 3.5 x 12 mm Xience Alpine stents, 100% reduced to 0%. Ramus intermediate has a tortuous origin. Circumflex mild disease. Right coronary artery mild diffuse disease. Markedly elevated LVEDP, 31 mmHg.  Lower Extremity Arterial Duplex 01/19/2019: No hemodynamically significant stenoses are identified in the bilateral lower extremity arterial system.  This exam reveals normal perfusion of  the right (ABI 1.04) and left (ABI 1.00) lower extremity. Mildly abnormal waveform at the level of the ankle suggests mild small vessel disease.  Lexiscan Myoview Stress Test 12/29/2018: Lexiscan stress test was performed. Stress EKG is non-diagnostic, as this is pharmacological stress test. Perfusion images reveal a very large size anterior, anteroseptal, apical and apical inferior transmural scar with moderate amount of ischemia especially at the basal septal region from base to mid ventricle.  Dynamic gated images reveal anterior, anteroseptal and apical akinesis. Stress LV EF is severely dysfunctional 29%.  High risk study. No prior studies for comparision.  Echocardiogram 03/29/2020:  Mildly depressed LV systolic function with visual EF 45-50%. Left  ventricle cavity is normal in size. Normal global wall motion. Doppler  evidence of grade I (impaired) diastolic dysfunction, elevated LAP.  No significant valvular abnormalities.  Compared to prior study dated 04/14/2019 no significant change.  Right and left heart cath 01/02/2021: LM: Distal LM bifurcation calcific 95% stenosis (1,0,1) LAD: Patent ostial LAD stent         Mid diffuse 30% disease Lcx: Ostial calcific 80% stenosis RCA: Mid 20% disease   Severe distal LM stenosis (1,0,1) Compensated ischemic cardiomyopathy  CABG x2 01/06/2021: Dr. Lowell Bouton SVG-OM  Carotid artery duplex 03/21/2021:  Duplex suggests stenosis in the right internal carotid artery (1-15%).  Duplex suggests stenosis in the left internal carotid artery (1-15%).  There is diffuse heterogenous plaque noted in bilateral common carotid and  internal carotid arteries.  Right vertebral artery flow is not visualized. Antegrade left vertebral  artery flow.  Compared to the study done on 08/30/2020, bilateral ICA stenosis of around  50% no longer present.  Further studies if clinically indicated.  EKG  05/08/2021: Sinus rhythm at a rate of 69 bpm.  Left  axis, left anterior fascicular block.  Borderline left atrial enlargement.  LVH with secondary ST-T wave changes.  Compared EKG 01/28/2021, no significant change.  EKG 09/06/2020: Sinus rhythm at a rate of 77 bpm.  Left atrial enlargement.  Left axis, left anterior fascicular block.  Poor R wave progression, cannot exclude anteroseptal infarct old.  LVH by voltage criteria with secondary ST-T wave changes, cannot exclude lateral ischemia.  Compared to EKG 02/25/2020, ST-T wave changes more prominent in V6.  EKG 02/25/2020: Sinus rhythm at a rate of 84 bpm, left atrial enlargement, left axis deviation, left anterior fascicular block.  Poor R wave progression, cannot exclude anterior septal infarct old.  Nonspecific T wave abnormality.  No evidence of ischemia.  Compared to EKG 07/27/2019, no significant change.  Assessment     ICD-10-CM   1. Coronary artery disease involving native coronary artery of native heart without angina pectoris  I25.10 EKG 12-Lead    2. Hx of CABG - LIMA-LAD, SVG-OM  Z95.1 EKG 12-Lead    3. Ischemic cardiomyopathy  I25.5     4. Essential hypertension  I10       Meds ordered this encounter  Medications   furosemide (LASIX) 40 MG tablet    Sig: Take 1 tablet (40 mg total) by mouth daily as needed for fluid or edema.    Dispense:  30 tablet    Refill:  0   Medications Discontinued During This Encounter  Medication Reason   midodrine (PROAMATINE) 10 MG tablet Discontinued by provider   furosemide (LASIX) 40 MG tablet Reorder   Recommendations:    Sonia Morales  is a 66 y.o. Caucasian female  with CAD, history of LAD stenting in 2017, tobacco use disorder in the past quit since angioplasty, asymptomatic bilateral carotid artery stenosis, hypertension, hyperlipidemia, uncontrolled diabetes mellitus insulin dependent and ischemic cardiomyopathy.  Patient underwent nuclear stress testing in September 2020 which revealed large anterior and apical and inferior transmural  scar with moderate amount of peri-infarct ischemia and EF of 29%.  Patient presented to [facility] on 12/30/2020 with acute hypoxic respiratory failure and pulmonary edema, she was subsequently transferred to [facility] where she subsequently underwent cardiac catheterization revealing severe left main and LCx stenosis.  She therefore underwent CABG x2 (LIMA-LAD, SVG-OM) by Dr. Kipp Brood.  She was diuresed following surgery and weaned off pressors and discharged with aspirin and Plavix.    Following discharge patient has had follow-up with cardiothoracic surgery who advised her to reduce midodrine to half a tablet twice daily and to stop this when her current prescription ran out.  She is released by CT surgery and recommended for as needed follow-up.  She has started cardiac rehab.  She now presents to our office for 29-monthfollow-up.  Since last office visit patient underwent carotid artery duplex which showed no significant carotid disease, therefore no further follow-up was recommended.  Viewed and discussed results of carotid artery duplex, details above.  Suspect patient's continued dyspnea on exertion is related to deconditioning as well as recent flu infection, and recovery from CABG.  I will resend referral to cardiac rehab so patient may begin this.  Notably patient's blood pressure is mildly elevated in the office today.  Will not make changes to medications at this time given history of hypertension, however encourage patient to monitor blood pressure on a regular basis at home and notify our office if it is >130/80 mmHg on average.  I have also refilled Lasix to be taken as needed.  Patient's physical exam and EKG remain unchanged.  Previous office visit.  Follow-up in 6 months, sooner if needed.   Sonia Berthold PA-C 05/08/2021, 11:53 AM Office: [phone]

## 2021-05-08 ENCOUNTER — Encounter (HOSPITAL_COMMUNITY): Payer: Medicare Other

## 2021-05-08 ENCOUNTER — Telehealth: Payer: Self-pay | Admitting: Student

## 2021-05-08 ENCOUNTER — Encounter: Payer: Self-pay | Admitting: Student

## 2021-05-08 ENCOUNTER — Ambulatory Visit: Payer: Medicare Other | Admitting: Student

## 2021-05-08 ENCOUNTER — Other Ambulatory Visit: Payer: Self-pay

## 2021-05-08 VITALS — BP 137/69 | HR 79 | Temp 97.8°F | Ht 63.0 in | Wt 180.0 lb

## 2021-05-08 DIAGNOSIS — I255 Ischemic cardiomyopathy: Secondary | ICD-10-CM

## 2021-05-08 DIAGNOSIS — I10 Essential (primary) hypertension: Secondary | ICD-10-CM

## 2021-05-08 DIAGNOSIS — I2510 Atherosclerotic heart disease of native coronary artery without angina pectoris: Secondary | ICD-10-CM

## 2021-05-08 DIAGNOSIS — Z951 Presence of aortocoronary bypass graft: Secondary | ICD-10-CM | POA: Diagnosis not present

## 2021-05-08 MED ORDER — FUROSEMIDE 40 MG PO TABS: 40.0000 mg | ORAL_TABLET | Freq: Every day | ORAL | 0 refills | Status: DC | PRN

## 2021-05-08 NOTE — Telephone Encounter (Signed)
Pt had an appointment today, says she was told to call back with her current list of medications. Please give her a call at [phone].

## 2021-05-10 ENCOUNTER — Telehealth: Payer: Self-pay

## 2021-05-10 ENCOUNTER — Other Ambulatory Visit: Payer: Self-pay

## 2021-05-10 ENCOUNTER — Encounter (HOSPITAL_COMMUNITY): Payer: Medicare Other

## 2021-05-10 MED ORDER — POTASSIUM CHLORIDE CRYS ER 20 MEQ PO TBCR: 20.0000 meq | EXTENDED_RELEASE_TABLET | Freq: Every day | ORAL | 3 refills | Status: DC

## 2021-05-10 NOTE — Telephone Encounter (Signed)
Spoke with patient, her granddaughter will be off from work at 3:30 and will be home close to 4pm and she is aware to call us back to reconcile all patients medications.

## 2021-05-10 NOTE — Telephone Encounter (Signed)
Pts daughter call back to go over medication list. Updated list in the [facility]. They wanted to know if the pt should start taking midodrine again. Please advise.

## 2021-05-10 NOTE — Telephone Encounter (Signed)
Called pt, pt stated she will have her granddaughter call back to go over medication list.

## 2021-05-11 DIAGNOSIS — M47812 Spondylosis without myelopathy or radiculopathy, cervical region: Secondary | ICD-10-CM | POA: Diagnosis not present

## 2021-05-11 DIAGNOSIS — Z79891 Long term (current) use of opiate analgesic: Secondary | ICD-10-CM | POA: Diagnosis not present

## 2021-05-11 DIAGNOSIS — G4700 Insomnia, unspecified: Secondary | ICD-10-CM | POA: Diagnosis not present

## 2021-05-11 DIAGNOSIS — M47816 Spondylosis without myelopathy or radiculopathy, lumbar region: Secondary | ICD-10-CM | POA: Diagnosis not present

## 2021-05-11 DIAGNOSIS — G894 Chronic pain syndrome: Secondary | ICD-10-CM | POA: Diagnosis not present

## 2021-05-11 NOTE — Telephone Encounter (Signed)
Patient's med list reviewed.

## 2021-05-12 ENCOUNTER — Encounter (HOSPITAL_COMMUNITY): Payer: Medicare Other

## 2021-05-12 NOTE — Telephone Encounter (Signed)
Called and spoke to pts daughter regarding medication list. She voiced understanding and will let the pt know.

## 2021-05-15 ENCOUNTER — Other Ambulatory Visit: Payer: Self-pay | Admitting: Physician Assistant

## 2021-05-15 ENCOUNTER — Encounter (HOSPITAL_COMMUNITY): Payer: Medicare Other

## 2021-05-17 ENCOUNTER — Encounter (HOSPITAL_COMMUNITY): Payer: Medicare Other

## 2021-05-19 ENCOUNTER — Encounter (HOSPITAL_COMMUNITY): Payer: Medicare Other

## 2021-05-22 ENCOUNTER — Encounter (HOSPITAL_COMMUNITY): Payer: Medicare Other

## 2021-05-23 ENCOUNTER — Telehealth: Payer: Self-pay | Admitting: Student

## 2021-05-23 ENCOUNTER — Other Ambulatory Visit: Payer: Self-pay

## 2021-05-23 MED ORDER — METOPROLOL TARTRATE 25 MG PO TABS: 12.5000 mg | ORAL_TABLET | Freq: Two times a day (BID) | ORAL | 3 refills | Status: DC

## 2021-05-23 MED ORDER — CLOPIDOGREL BISULFATE 75 MG PO TABS: 75.0000 mg | ORAL_TABLET | Freq: Every day | ORAL | 3 refills | Status: DC

## 2021-05-23 NOTE — Telephone Encounter (Signed)
Pt req refill, states pharmacy has sent req twice   metoprolol tartrate (LOPRESSOR) 25 MG tablet  clopidogrel (PLAVIX) 75 MG tablet  [facility] - [place], [place] - [phone]  [phone]

## 2021-05-24 ENCOUNTER — Encounter (HOSPITAL_COMMUNITY): Payer: Medicare Other

## 2021-05-26 ENCOUNTER — Encounter (HOSPITAL_COMMUNITY): Payer: Medicare Other

## 2021-05-29 ENCOUNTER — Encounter (HOSPITAL_COMMUNITY): Payer: Medicare Other

## 2021-05-31 ENCOUNTER — Encounter (HOSPITAL_COMMUNITY): Payer: Medicare Other

## 2021-06-02 ENCOUNTER — Encounter (HOSPITAL_COMMUNITY): Payer: Medicare Other

## 2021-06-05 ENCOUNTER — Encounter (HOSPITAL_COMMUNITY): Payer: Medicare Other

## 2021-06-07 ENCOUNTER — Encounter (HOSPITAL_COMMUNITY): Payer: Medicare Other

## 2021-06-08 DIAGNOSIS — G894 Chronic pain syndrome: Secondary | ICD-10-CM | POA: Diagnosis not present

## 2021-06-08 DIAGNOSIS — G4700 Insomnia, unspecified: Secondary | ICD-10-CM | POA: Diagnosis not present

## 2021-06-08 DIAGNOSIS — M47812 Spondylosis without myelopathy or radiculopathy, cervical region: Secondary | ICD-10-CM | POA: Diagnosis not present

## 2021-06-08 DIAGNOSIS — M47816 Spondylosis without myelopathy or radiculopathy, lumbar region: Secondary | ICD-10-CM | POA: Diagnosis not present

## 2021-06-09 ENCOUNTER — Encounter (HOSPITAL_COMMUNITY): Payer: Medicare Other

## 2021-06-12 ENCOUNTER — Encounter (HOSPITAL_COMMUNITY): Payer: Medicare Other

## 2021-06-14 ENCOUNTER — Encounter (HOSPITAL_COMMUNITY): Payer: Medicare Other

## 2021-06-16 ENCOUNTER — Encounter (HOSPITAL_COMMUNITY): Payer: Medicare Other

## 2021-07-03 ENCOUNTER — Telehealth: Payer: Self-pay

## 2021-07-03 NOTE — Telephone Encounter (Signed)
Can she see her PCP or see [place]. Hard to tell, most probably not carotid related

## 2021-07-03 NOTE — Telephone Encounter (Signed)
Pt called and stated that the right side of her neck is hurting. Pain is right behind her ear on the right side. She stated that it is a sharp pain. She has tried heat and ice to make the pain go away. No pain anywhere else. She stated that she googled it and is afraid that it may be her carotid artery.  ?

## 2021-07-04 NOTE — Telephone Encounter (Signed)
Called and spoke to pt, pt will be coming in to see Dr. Einar Gip 07/05/2021 at 10:15 am.

## 2021-07-05 ENCOUNTER — Ambulatory Visit: Payer: Medicare Other | Admitting: Cardiology

## 2021-07-05 ENCOUNTER — Encounter: Payer: Self-pay | Admitting: Cardiology

## 2021-07-05 ENCOUNTER — Other Ambulatory Visit: Payer: Self-pay

## 2021-07-05 VITALS — BP 98/61 | HR 77 | Temp 98.0°F | Resp 17 | Ht 63.0 in | Wt 190.2 lb

## 2021-07-05 DIAGNOSIS — I6523 Occlusion and stenosis of bilateral carotid arteries: Secondary | ICD-10-CM

## 2021-07-05 DIAGNOSIS — I255 Ischemic cardiomyopathy: Secondary | ICD-10-CM | POA: Diagnosis not present

## 2021-07-05 DIAGNOSIS — M542 Cervicalgia: Secondary | ICD-10-CM | POA: Diagnosis not present

## 2021-07-05 DIAGNOSIS — I2510 Atherosclerotic heart disease of native coronary artery without angina pectoris: Secondary | ICD-10-CM

## 2021-07-05 DIAGNOSIS — Z951 Presence of aortocoronary bypass graft: Secondary | ICD-10-CM

## 2021-07-05 MED ORDER — METHOCARBAMOL 500 MG PO TABS: 500.0000 mg | ORAL_TABLET | Freq: Four times a day (QID) | ORAL | 0 refills | Status: DC | PRN

## 2021-07-05 NOTE — Progress Notes (Signed)
? ?Primary Physician/Referring:  Caryl Bis, MD ? ?Patient ID: Sonia Morales, female    DOB: 15-Feb-1956, 66 y.o.   MRN: [id number] ? ?Chief Complaint  ?Patient presents with  ? RIGHT SIDED NECK PAIN  ? ?HPI:   ? ?Sonia Morales  is a 66 y.o. Caucasian female  with CAD, history of LAD stenting in 2017, tobacco use disorder in the past quit since angioplasty, asymptomatic bilateral carotid artery stenosis, hypertension, hyperlipidemia, uncontrolled diabetes mellitus insulin dependent and ischemic cardiomyopathy.  Presented with acute pulmonary edema and ACS on 12/30/2020, cardiac catheterization revealing severe left main disease, underwent urgent CABG. ? ?I am seeing her today for acute onset of right neck pain, but on further discussions, she has been having right-sided neck pain since CABG. she was concerned about carotid artery disease and wanted to discuss and to be seen by me. ? ?She has not returned back to cardiac rehab and would like to resume cardiac rehab as well if stable ? ? ? ?Past Medical History:  ?Diagnosis Date  ? Acute anterior wall MI ([place]) 10/04/2015  ? Acute MI anterior wall first episode care [facility]) 10/04/2015  ? Coronary angiogram 10/04/2015: Occluded proximal LAD, superior takeoff of ramus intermediate, large circumflex, moderate to large RCA with mild diffuse disease. S/P thrombectomy, overlapping stents to the proximal LAD with 3.5 x 18, 3.5 x 12 mm Xience DES, 100% to 0%.   ? CAD (coronary artery disease), native coronary artery 10/11/2015  ? Coronary angiogram 10/04/2015: Left main mild calcification. LAD tortuous origin, occluded in the proximal segment. Diagonal 2 is moderate-sized, has proximal 50% stenosis. S/P thrombectomy & DES overlapping 3.5 x 18 and 3.5 x 12 mm Xience Alpine stents,   Ramus intermediate has a tortuous origin. Circumflex mild disease. Right coronary artery mild diffuse disease  ? Colitis   ? DM (diabetes mellitus), type 2, uncontrolled with complications 06/14/6008  ?  H/O tobacco use, presenting hazards to health 06/12/2018  ? Quit in 2017 after MI  ? Hypercholesteremia 06/12/2018  ? ?Family History  ?Problem Relation Age of Onset  ? Heart attack Mother   ? Cancer Brother   ? ?Social History  ? ?Tobacco Use  ? Smoking status: Former  ?  Packs/day: 1.00  ?  Years: 30.00  ?  Pack years: 30.00  ?  Types: Cigarettes  ?  Quit date: 2017  ?  Years since quitting: 6.2  ? Smokeless tobacco: Never  ?Substance Use Topics  ? Alcohol use: Never  ? Marital Status: Married  ? ?ROS  ?Review of Systems  ?Cardiovascular:  Positive for dyspnea on exertion. Negative for chest pain and leg swelling.  ?Objective  ? ? ?  07/05/2021  ? 10:03 AM 05/08/2021  ? 11:16 AM 03/23/2021  ? 11:12 AM  ?Vitals with BMI  ?Height 5' 3"  5' 3"    ?Weight 190 lbs 3 oz 180 lbs   ?BMI 33.7 31.89   ?Systolic 98 932 355  ?Diastolic 61 69 52  ?Pulse 77 79 73  ?  ?Orthostatic VS for the past 72 hrs (Last 3 readings): ? Patient Position BP Location Cuff Size  ?07/05/21 1003 Sitting Left Arm Large  ? ?Physical Exam ?Constitutional:   ?   General: She is not in acute distress. ?   Appearance: She is well-developed.  ?   Comments: Mildly obese  ?Neck:  ?   Vascular: No JVD.  ?   Comments: Neck scar from cervical fusion left seen. ?Cardiovascular:  ?  Rate and Rhythm: Normal rate and regular rhythm.  ?   Pulses:     ?     Carotid pulses are  on the right side with bruit and  on the left side with bruit. ?     Femoral pulses are 1+ on the right side and 1+ on the left side with bruit. ?     Popliteal pulses are 2+ on the right side and 2+ on the left side.  ?     Dorsalis pedis pulses are 2+ on the right side and 1+ on the left side.  ?     Posterior tibial pulses are 0 on the right side and 0 on the left side.  ?   Heart sounds: S1 normal and S2 normal. Murmur heard.  ?Midsystolic murmur is present with a grade of 2/6 at the upper right sternal border.  ?  No gallop.  ?Pulmonary:  ?   Effort: Pulmonary effort is normal. No respiratory  distress.  ?   Breath sounds: Normal breath sounds. No wheezing, rhonchi or rales.  ?Abdominal:  ?   General: Bowel sounds are normal.  ?   Palpations: Abdomen is soft.  ?Musculoskeletal:  ?   Right lower leg: No edema.  ?   Left lower leg: No edema.  ? ?Laboratory examination:  ? ? ?Recent Labs  ?  01/10/21 ?0823 01/11/21 ?0045 01/12/21 ?0026  ?NA 135 136 135  ?K 3.8 4.3 4.2  ?CL 102 102 101  ?CO2 25 26 25   ?GLUCOSE 187* 203* 240*  ?BUN 14 19 18   ?CREATININE 0.84 0.83 0.95  ?CALCIUM 8.6* 8.6* 8.8*  ?GFRNONAA >60 >60 >60  ? ?CrCl cannot be calculated (Patient's most recent lab result is older than the maximum 21 days allowed.).  ? ?  Latest Ref Rng & Units 01/12/2021  ? 12:26 AM 01/11/2021  ? 12:45 AM 01/10/2021  ?  8:23 AM  ?CMP  ?Glucose 70 - 99 mg/dL 240   203   187    ?BUN 8 - 23 mg/dL 18   19   14     ?Creatinine 0.44 - 1.00 mg/dL 0.95   0.83   0.84    ?Sodium 135 - 145 mmol/L 135   136   135    ?Potassium 3.5 - 5.1 mmol/L 4.2   4.3   3.8    ?Chloride 98 - 111 mmol/L 101   102   102    ?CO2 22 - 32 mmol/L 25   26   25     ?Calcium 8.9 - 10.3 mg/dL 8.8   8.6   8.6    ? ? ?  Latest Ref Rng & Units 01/11/2021  ? 12:45 AM 01/10/2021  ?  8:23 AM 01/09/2021  ?  5:31 PM  ?CBC  ?WBC 4.0 - 10.5 K/uL 8.1   8.5     ?Hemoglobin 12.0 - 15.0 g/dL 9.3   9.9   9.7    ?Hematocrit 36.0 - 46.0 % 28.4   30.2   28.7    ?Platelets 150 - 400 K/uL 213   188     ? ?Lab Results  ?Component Value Date  ? CHOL 115 12/31/2020  ? HDL 46 12/31/2020  ? [place] 52 12/31/2020  ? LDLDIRECT 57 01/13/2019  ? TRIG 84 12/31/2020  ? CHOLHDL 2.5 12/31/2020  ?  ?HEMOGLOBIN A1C ?Lab Results  ?Component Value Date  ? HGBA1C 8.9 (H) 12/31/2020  ? MPG 208.73 12/31/2020  ? ?  TSH ?Recent Labs  ?  12/31/20 ?2034  ?TSH 1.444  ? ?External labs: ? ?02/12/2020: ?HDL 51, total cholesterol 115, triglycerides 58, calculated LDL 52.4 ?A1c 13.4% ?BUN 13, creatinine 0.73, EGFR 89 ? ?A1C 11.600 % 11/10/2018 ? ?Allergies  ?No Known Allergies  ? ?Final Medications at End of Visit    ? ? ?Current Outpatient Medications:  ?  Accu-Chek Softclix Lancets lancets, Use as instructed to monitor glucose 4 times daily, Disp: 100 each, Rfl: 12 ?  aspirin EC 81 MG EC tablet, Take 1 tablet (81 mg total) by mouth daily. Swallow whole., Disp: 30 tablet, Rfl: 11 ?  atorvastatin (LIPITOR) 80 MG tablet, Take 1 tablet (80 mg total) by mouth daily at 6 PM. (Patient taking differently: Take 80 mg by mouth at bedtime.), Disp: 30 tablet, Rfl: 0 ?  clopidogrel (PLAVIX) 75 MG tablet, Take 1 tablet (75 mg total) by mouth daily., Disp: 90 tablet, Rfl: 3 ?  diclofenac Sodium (VOLTAREN) 1 % GEL, Apply 1 application topically daily as needed (pain)., Disp: , Rfl:  ?  FLUoxetine (PROZAC) 40 MG capsule, Take 2 capsules (80 mg total) by mouth 2 (two) times daily., Disp: , Rfl:  ?  furosemide (LASIX) 40 MG tablet, Take 1 tablet (40 mg total) by mouth daily as needed for fluid or edema., Disp: 30 tablet, Rfl: 0 ?  gabapentin (NEURONTIN) 300 MG capsule, Take 1 capsule (300 mg total) by mouth every other day., Disp: , Rfl:  ?  glucose blood (ACCU-CHEK GUIDE) test strip, Use as instructed to monitor glucose 4 times daily, Disp: 100 each, Rfl: 12 ?  insulin aspart protamine- aspart (NOVOLOG MIX 70/30) (70-30) 100 UNIT/ML injection, Inject 40 Units into the skin 2 (two) times daily., Disp: , Rfl:  ?  methocarbamol (ROBAXIN) 500 MG tablet, Take 1 tablet (500 mg total) by mouth every 6 (six) hours as needed for muscle spasms., Disp: 30 tablet, Rfl: 0 ?  metoprolol tartrate (LOPRESSOR) 25 MG tablet, Take 0.5 tablets (12.5 mg total) by mouth 2 (two) times daily., Disp: 45 tablet, Rfl: 3 ?  Multiple Vitamin (MULTIVITAMIN) tablet, Take 1 tablet by mouth daily., Disp: , Rfl:  ?  nitroGLYCERIN (NITROSTAT) 0.4 MG SL tablet, Place 0.4 mg under the tongue every 5 (five) minutes as needed for chest pain., Disp: , Rfl:  ?  Oxycodone HCl 10 MG TABS, Take 10 mg by mouth 5 (five) times daily as needed (pain)., Disp: , Rfl:  ?  potassium chloride SA  (KLOR-CON M) 20 MEQ tablet, Take 1 tablet (20 mEq total) by mouth daily., Disp: 30 tablet, Rfl: 3 ?  Prasterone, DHEA, (DHEA 50 PO), Take 1 capsule by mouth daily., Disp: , Rfl:  ?  traZODone (DESYR

## 2021-07-06 DIAGNOSIS — G4700 Insomnia, unspecified: Secondary | ICD-10-CM | POA: Diagnosis not present

## 2021-07-06 DIAGNOSIS — M47812 Spondylosis without myelopathy or radiculopathy, cervical region: Secondary | ICD-10-CM | POA: Diagnosis not present

## 2021-07-06 DIAGNOSIS — G894 Chronic pain syndrome: Secondary | ICD-10-CM | POA: Diagnosis not present

## 2021-07-06 DIAGNOSIS — M47816 Spondylosis without myelopathy or radiculopathy, lumbar region: Secondary | ICD-10-CM | POA: Diagnosis not present

## 2021-07-10 ENCOUNTER — Other Ambulatory Visit: Payer: Self-pay

## 2021-07-10 ENCOUNTER — Ambulatory Visit: Payer: Medicare Other

## 2021-07-10 DIAGNOSIS — I6523 Occlusion and stenosis of bilateral carotid arteries: Secondary | ICD-10-CM | POA: Diagnosis not present

## 2021-07-10 DIAGNOSIS — I2510 Atherosclerotic heart disease of native coronary artery without angina pectoris: Secondary | ICD-10-CM

## 2021-07-13 NOTE — Progress Notes (Signed)
Heart function is better and fluid around the heart has resolved. Very stable echo

## 2021-07-14 NOTE — Progress Notes (Signed)
Called and spoke to pt regarding echo results, pt voiced understanding.

## 2021-08-03 DIAGNOSIS — M47816 Spondylosis without myelopathy or radiculopathy, lumbar region: Secondary | ICD-10-CM | POA: Diagnosis not present

## 2021-08-03 DIAGNOSIS — G4700 Insomnia, unspecified: Secondary | ICD-10-CM | POA: Diagnosis not present

## 2021-08-03 DIAGNOSIS — G894 Chronic pain syndrome: Secondary | ICD-10-CM | POA: Diagnosis not present

## 2021-08-03 DIAGNOSIS — M47812 Spondylosis without myelopathy or radiculopathy, cervical region: Secondary | ICD-10-CM | POA: Diagnosis not present

## 2021-08-16 DIAGNOSIS — M7061 Trochanteric bursitis, right hip: Secondary | ICD-10-CM | POA: Diagnosis not present

## 2021-08-31 DIAGNOSIS — G894 Chronic pain syndrome: Secondary | ICD-10-CM | POA: Diagnosis not present

## 2021-08-31 DIAGNOSIS — M47812 Spondylosis without myelopathy or radiculopathy, cervical region: Secondary | ICD-10-CM | POA: Diagnosis not present

## 2021-08-31 DIAGNOSIS — G4700 Insomnia, unspecified: Secondary | ICD-10-CM | POA: Diagnosis not present

## 2021-08-31 DIAGNOSIS — M47816 Spondylosis without myelopathy or radiculopathy, lumbar region: Secondary | ICD-10-CM | POA: Diagnosis not present

## 2021-09-28 DIAGNOSIS — G894 Chronic pain syndrome: Secondary | ICD-10-CM | POA: Diagnosis not present

## 2021-09-28 DIAGNOSIS — M47816 Spondylosis without myelopathy or radiculopathy, lumbar region: Secondary | ICD-10-CM | POA: Diagnosis not present

## 2021-09-28 DIAGNOSIS — G4700 Insomnia, unspecified: Secondary | ICD-10-CM | POA: Diagnosis not present

## 2021-09-28 DIAGNOSIS — M47812 Spondylosis without myelopathy or radiculopathy, cervical region: Secondary | ICD-10-CM | POA: Diagnosis not present

## 2021-10-02 NOTE — Telephone Encounter (Signed)
Error

## 2021-10-05 ENCOUNTER — Ambulatory Visit: Payer: Medicare Other | Admitting: Cardiology

## 2021-10-26 DIAGNOSIS — M47812 Spondylosis without myelopathy or radiculopathy, cervical region: Secondary | ICD-10-CM | POA: Diagnosis not present

## 2021-10-26 DIAGNOSIS — M47816 Spondylosis without myelopathy or radiculopathy, lumbar region: Secondary | ICD-10-CM | POA: Diagnosis not present

## 2021-10-26 DIAGNOSIS — G4700 Insomnia, unspecified: Secondary | ICD-10-CM | POA: Diagnosis not present

## 2021-10-26 DIAGNOSIS — G894 Chronic pain syndrome: Secondary | ICD-10-CM | POA: Diagnosis not present

## 2021-10-30 ENCOUNTER — Telehealth: Payer: Self-pay | Admitting: Nurse Practitioner

## 2021-10-30 NOTE — Telephone Encounter (Signed)
Received another referral on pt. Patient is established. If she calls back, sch a fu

## 2021-11-02 ENCOUNTER — Ambulatory Visit: Payer: Medicare Other | Admitting: Cardiology

## 2021-11-23 DIAGNOSIS — M47816 Spondylosis without myelopathy or radiculopathy, lumbar region: Secondary | ICD-10-CM | POA: Diagnosis not present

## 2021-11-23 DIAGNOSIS — G894 Chronic pain syndrome: Secondary | ICD-10-CM | POA: Diagnosis not present

## 2021-11-23 DIAGNOSIS — M47812 Spondylosis without myelopathy or radiculopathy, cervical region: Secondary | ICD-10-CM | POA: Diagnosis not present

## 2021-11-23 DIAGNOSIS — G4700 Insomnia, unspecified: Secondary | ICD-10-CM | POA: Diagnosis not present

## 2021-11-27 ENCOUNTER — Ambulatory Visit: Payer: Medicare Other | Admitting: Cardiology

## 2021-11-27 ENCOUNTER — Encounter: Payer: Self-pay | Admitting: Cardiology

## 2021-11-27 VITALS — BP 119/70 | HR 74 | Temp 98.1°F | Resp 16 | Ht 63.0 in | Wt 193.0 lb

## 2021-11-27 DIAGNOSIS — I10 Essential (primary) hypertension: Secondary | ICD-10-CM | POA: Diagnosis not present

## 2021-11-27 DIAGNOSIS — I2510 Atherosclerotic heart disease of native coronary artery without angina pectoris: Secondary | ICD-10-CM

## 2021-11-27 DIAGNOSIS — I255 Ischemic cardiomyopathy: Secondary | ICD-10-CM | POA: Diagnosis not present

## 2021-11-27 DIAGNOSIS — E119 Type 2 diabetes mellitus without complications: Secondary | ICD-10-CM | POA: Diagnosis not present

## 2021-11-27 DIAGNOSIS — Z794 Long term (current) use of insulin: Secondary | ICD-10-CM | POA: Diagnosis not present

## 2021-11-27 MED ORDER — EMPAGLIFLOZIN 25 MG PO TABS: 25.0000 mg | ORAL_TABLET | Freq: Every day | ORAL | 1 refills | Status: DC

## 2021-11-27 NOTE — Progress Notes (Signed)
Primary Physician/Referring:  Caryl Bis, MD  Patient ID: Sonia Morales, female    DOB: 1955-12-14, 66 y.o.   MRN: [id number]  Chief Complaint  Patient presents with   Congestive Heart Failure   Coronary Artery Disease   Results   Follow-up    3 months   HPI:    Sonia Morales  is a 66 y.o. Caucasian female  with CAD, history of LAD stenting in 2017, tobacco use disorder in the past quit since angioplasty,  hypertension, hyperlipidemia, uncontrolled diabetes mellitus on insulin and ischemic cardiomyopathy.  Presented with acute pulmonary edema and ACS on 12/30/2020, cardiac catheterization revealing severe left main disease, underwent urgent CABG.  I seen her 3 months ago for acute neck pain, clearly musculoskeletal.  She has had waxing and waning nature of muscular pain in her neck.  She has not had any dyspnea, no PND or orthopnea.  Unfortunately she has been gaining weight, since January 2023, she has gained about 13 pounds in weight.  Diabetes continues to be uncontrolled.   Past Medical History:  Diagnosis Date   Acute anterior wall MI ([place]) 10/04/2015   Acute MI anterior wall first episode care [facility]) 10/04/2015   Coronary angiogram 10/04/2015: Occluded proximal LAD, superior takeoff of ramus intermediate, large circumflex, moderate to large RCA with mild diffuse disease. S/P thrombectomy, overlapping stents to the proximal LAD with 3.5 x 18, 3.5 x 12 mm Xience DES, 100% to 0%.    CAD (coronary artery disease), native coronary artery 10/11/2015   Coronary angiogram 10/04/2015: Left main mild calcification. LAD tortuous origin, occluded in the proximal segment. Diagonal 2 is moderate-sized, has proximal 50% stenosis. S/P thrombectomy & DES overlapping 3.5 x 18 and 3.5 x 12 mm Xience Alpine stents,   Ramus intermediate has a tortuous origin. Circumflex mild disease. Right coronary artery mild diffuse disease   Colitis    DM (diabetes mellitus), type 2, uncontrolled with complications  [id number]   H/O tobacco use, presenting hazards to health 06/12/2018   Quit in 2017 after MI   Hypercholesteremia 06/12/2018   Social History   Tobacco Use   Smoking status: Former    Packs/day: 1.00    Years: 30.00    Total pack years: 30.00    Types: Cigarettes    Quit date: 2017    Years since quitting: 6.6   Smokeless tobacco: Never  Substance Use Topics   Alcohol use: Never   Marital Status: Married   ROS  Review of Systems  Cardiovascular:  Positive for dyspnea on exertion. Negative for chest pain and leg swelling.   Objective      11/27/2021   10:25 AM 07/05/2021   10:03 AM 05/08/2021   11:16 AM  Vitals with BMI  Height 5\' 3"  5\' 3"  5\' 3"   Weight 193 lbs 190 lbs 3 oz 180 lbs  BMI 34.2 [id number]  Systolic 123456 98 0000000  Diastolic 70 61 69  Pulse 74 77 79    Physical Exam Constitutional:      Appearance: She is well-developed.     Comments: Mildly obese  Neck:     Vascular: No JVD.     Comments: Neck scar from cervical fusion left seen. Cardiovascular:     Rate and Rhythm: Normal rate and regular rhythm.     Pulses:          Carotid pulses are  on the right side with bruit and  on the left side with bruit.  Femoral pulses are 1+ on the right side and 1+ on the left side with bruit.      Popliteal pulses are 2+ on the right side and 2+ on the left side.       Dorsalis pedis pulses are 2+ on the right side and 1+ on the left side.       Posterior tibial pulses are 0 on the right side and 0 on the left side.     Heart sounds: S1 normal and S2 normal. No murmur heard.    No gallop.  Pulmonary:     Effort: Pulmonary effort is normal. No respiratory distress.     Breath sounds: Normal breath sounds. No wheezing, rhonchi or rales.  Abdominal:     General: Bowel sounds are normal.     Palpations: Abdomen is soft.  Musculoskeletal:     Right lower leg: No edema.     Left lower leg: No edema.    Laboratory examination:   External labs:  Cholesterol, total  106.000 M 04/20/2021 HDL 43.000 mg 04/20/2021 LDL 47.000 MG 04/20/2021 Triglycerides 80.000 mg 04/20/2021  A1C 9.800 % 04/21/2021 TSH 1.444 12/31/2020  Hemoglobin 12.200 g/d 04/20/2021 Platelets 255.000 X1 04/20/2021  Creatinine, Serum 0.830 MG/ 04/20/2021 Potassium 4.900 mm 04/20/2021 ALT (SGPT) 14.000 IU/ 04/20/2021   Allergies  No Known Allergies   Final Medications at End of Visit     Current Outpatient Medications:    Accu-Chek Softclix Lancets lancets, Use as instructed to monitor glucose 4 times daily, Disp: 100 each, Rfl: 12   aspirin EC 81 MG EC tablet, Take 1 tablet (81 mg total) by mouth daily. Swallow whole., Disp: 30 tablet, Rfl: 11   atorvastatin (LIPITOR) 80 MG tablet, Take 1 tablet (80 mg total) by mouth daily at 6 PM. (Patient taking differently: Take 80 mg by mouth at bedtime.), Disp: 30 tablet, Rfl: 0   clopidogrel (PLAVIX) 75 MG tablet, Take 1 tablet (75 mg total) by mouth daily., Disp: 90 tablet, Rfl: 3   diclofenac Sodium (VOLTAREN) 1 % GEL, Apply 1 application topically daily as needed (pain)., Disp: , Rfl:    empagliflozin (JARDIANCE) 25 MG TABS tablet, Take 1 tablet (25 mg total) by mouth daily before breakfast., Disp: 30 tablet, Rfl: 1   FLUoxetine (PROZAC) 40 MG capsule, Take 2 capsules (80 mg total) by mouth 2 (two) times daily., Disp: , Rfl:    furosemide (LASIX) 40 MG tablet, Take 1 tablet (40 mg total) by mouth daily as needed for fluid or edema., Disp: 30 tablet, Rfl: 0   gabapentin (NEURONTIN) 300 MG capsule, Take 1 capsule (300 mg total) by mouth every other day., Disp: , Rfl:    glucose blood (ACCU-CHEK GUIDE) test strip, Use as instructed to monitor glucose 4 times daily, Disp: 100 each, Rfl: 12   insulin aspart protamine- aspart (NOVOLOG MIX 70/30) (70-30) 100 UNIT/ML injection, Inject 40 Units into the skin 2 (two) times daily., Disp: , Rfl:    methocarbamol (ROBAXIN) 500 MG tablet, Take 1 tablet (500 mg total) by mouth every 6 (six) hours as needed for muscle  spasms., Disp: 30 tablet, Rfl: 0   metoprolol tartrate (LOPRESSOR) 25 MG tablet, Take 0.5 tablets (12.5 mg total) by mouth 2 (two) times daily., Disp: 45 tablet, Rfl: 3   Multiple Vitamin (MULTIVITAMIN) tablet, Take 1 tablet by mouth daily., Disp: , Rfl:    nitroGLYCERIN (NITROSTAT) 0.4 MG SL tablet, Place 0.4 mg under the tongue every 5 (five) minutes as  needed for chest pain., Disp: , Rfl:    Oxycodone HCl 10 MG TABS, Take 10 mg by mouth 5 (five) times daily as needed (pain)., Disp: , Rfl:    potassium chloride SA (KLOR-CON M) 20 MEQ tablet, Take 1 tablet (20 mEq total) by mouth daily., Disp: 30 tablet, Rfl: 3   Prasterone, DHEA, (DHEA 50 PO), Take 1 capsule by mouth daily., Disp: , Rfl:    traZODone (DESYREL) 50 MG tablet, Take 50 mg by mouth at bedtime. , Disp: , Rfl:   Radiology:  No results found.   Cardiac Studies:   Lower Extremity Arterial Duplex 01/19/2019: No hemodynamically significant stenoses are identified in the bilateral lower extremity arterial system.  This exam reveals normal perfusion of the right (ABI 1.04) and left (ABI 1.00) lower extremity. Mildly abnormal waveform at the level of the ankle suggests mild small vessel disease.  Lexiscan Myoview Stress Test 12/29/2018: Lexiscan stress test was performed. Stress EKG is non-diagnostic, as this is pharmacological stress test. Perfusion images reveal a very large size anterior, anteroseptal, apical and apical inferior transmural scar with moderate amount of ischemia especially at the basal septal region from base to mid ventricle.  Dynamic gated images reveal anterior, anteroseptal and apical akinesis. Stress LV EF is severely dysfunctional 29%.  High risk study. No prior studies for comparision.  Right and left heart cath 01/02/2021: LM: Distal LM bifurcation calcific 95% stenosis (1,0,1) LAD: Patent ostial LAD stent, 3.5 x 18 and 3.5 x 12 mm Xience Alpine  placed 10/04/2015 for STEMI.  Mid diffuse 30% disease Lcx: Ostial  calcific 80% stenosis RCA: Mid 20% disease   Severe distal LM stenosis (1,0,1) Compensated ischemic cardiomyopathy  CABG x2 01/06/2021: Dr. Kingsley Plan SVG-OM  Carotid artery duplex 03/21/2021:  Duplex suggests stenosis in the right internal carotid artery (1-15%).  Duplex suggests stenosis in the left internal carotid artery (1-15%).  There is diffuse heterogenous plaque noted in bilateral common carotid and  internal carotid arteries.  Right vertebral artery flow is not visualized. Antegrade left vertebral artery flow.  Compared to the study done on 08/30/2020, bilateral ICA stenosis of around  50% no longer present.  Further studies if clinically indicated.  PCV ECHOCARDIOGRAM COMPLETE 07/10/2021  Narrative Echocardiogram 07/10/2021: Study Quality: Technically difficult study. Mildly depressed LV systolic function with visual EF 40-45%. Left ventricle cavity is mildly dilated. Normal left ventricular wall thickness. Abnormal septal wall motion due to post-operative coronary artery bypass graft. Doppler evidence of grade I (impaired) diastolic dysfunction, normal LAP. Left ventricle regional wall motion findings: Basal anteroseptal, Mid anteroseptal, Mid inferior, Mid inferoseptal, Apical inferior, Apical septal and Apical cap hypokinesis. Compared to study 01/01/2021 LVEF improved from 30-35% to 40-45%, inferoseptal and apical inferior RWMA appears to be new, pericardial effusion has resolved, otherwise no significant change.  EKG  05/08/2021: Sinus rhythm at a rate of 69 bpm.  Left axis, left anterior fascicular block.  Borderline left atrial enlargement.  LVH with secondary ST-T wave changes.  Compared EKG 01/28/2021, no significant change.  EKG 09/06/2020: Sinus rhythm at a rate of 77 bpm.  Left atrial enlargement.  Left axis, left anterior fascicular block.  Poor R wave progression, cannot exclude anteroseptal infarct old.  LVH by voltage criteria with secondary ST-T wave  changes, cannot exclude lateral ischemia.  Compared to EKG 02/25/2020, ST-T wave changes more prominent in V6.  EKG 02/25/2020: Sinus rhythm at a rate of 84 bpm, left atrial enlargement, left axis deviation, left anterior fascicular block.  Poor R wave progression, cannot exclude anterior septal infarct old.  Nonspecific T wave abnormality.  No evidence of ischemia.  Compared to EKG 07/27/2019, no significant change.  Assessment     ICD-10-CM   1. Coronary artery disease involving native coronary artery of native heart without angina pectoris  I25.10     2. Ischemic cardiomyopathy  I25.5 empagliflozin (JARDIANCE) 25 MG TABS tablet    3. Primary hypertension  I10     4. Type 2 diabetes mellitus without complication, with long-term current use of insulin (HCC)  E11.9 empagliflozin (JARDIANCE) 25 MG TABS tablet   Z79.4       Meds ordered this encounter  Medications   empagliflozin (JARDIANCE) 25 MG TABS tablet    Sig: Take 1 tablet (25 mg total) by mouth daily before breakfast.    Dispense:  30 tablet    Refill:  1    Refills from Dr. Almond Lint   There are no discontinued medications.  Recommendations:    Sonia Morales  is a 66 y.o. Caucasian female  with CAD, history of LAD stenting in 2017, tobacco use disorder in the past quit since angioplasty,  hypertension, hyperlipidemia, uncontrolled diabetes mellitus on insulin and ischemic cardiomyopathy.  Presented with acute pulmonary edema and ACS on 12/30/2020, cardiac catheterization revealing severe left main disease, underwent urgent CABG.  I had seen her 3 months ago for musculoskeletal pain involving her neck.  She still continues to have these episodes, discussed with her to follow-up with her PCP regarding this.  With regard to coronary artery disease, she has not had any chest pain, no dyspnea, no leg edema, presently without any clinical evidence of heart failure.  Fortunately LVEF has improved to low normal to mildly reduced  by recent echocardiogram.  Unfortunately diabetes continues to be uncontrolled, weight gain has been another issue.  I would like to try Jardiance 25 mg daily both for weight loss, CAD, CHF.  Samples were given, if she tolerates this, she can follow-up with Dr. Donzetta Sprung for refills and further management.  I will see him back in 6 months for follow-up.  I am not sure she has diagnosis of hypertension as her blood pressure has been borderline low.  She is only on metoprolol tartrate 12.5 mg twice daily from cardiac standpoint.  She is not on an ACE inhibitor due to low blood pressure.  I will follow-up by telephone encounter with regard to her symptoms since starting Jardiance.    Yates Decamp, MD, [facility] 11/27/2021, 11:06 AM Office: [phone] Fax: [phone] Pager: [phone]

## 2021-12-01 DIAGNOSIS — Z6835 Body mass index (BMI) 35.0-35.9, adult: Secondary | ICD-10-CM | POA: Diagnosis not present

## 2021-12-01 DIAGNOSIS — F17210 Nicotine dependence, cigarettes, uncomplicated: Secondary | ICD-10-CM | POA: Diagnosis not present

## 2021-12-01 DIAGNOSIS — M542 Cervicalgia: Secondary | ICD-10-CM | POA: Diagnosis not present

## 2021-12-14 ENCOUNTER — Other Ambulatory Visit: Payer: Self-pay | Admitting: *Deleted

## 2021-12-14 NOTE — Patient Outreach (Signed)
  Care Coordination   Initial Visit Note   12/14/2021 Name: Sonia Morales MRN: [id number] DOB: 1956-01-29  Sonia SMYSER is a 66 y.o. year old female who sees Richardean Chimera, MD for primary care. I spoke with  Cyndie Mull by phone today.  What matters to the patients health and wellness today?  Not a good time to talk, request call back tomorrow.    SDOH assessments and interventions completed:  No     Care Coordination Interventions Activated:  No  Care Coordination Interventions:  No, not indicated   Follow up plan:  Will call tomorrow.    Encounter Outcome:  Pt. Request to Call Back   Kemper Durie, RN, MSN, [facility] Care Management Care Management Coordinator [phone]

## 2021-12-15 ENCOUNTER — Other Ambulatory Visit: Payer: Self-pay | Admitting: *Deleted

## 2021-12-15 NOTE — Patient Outreach (Signed)
  Care Coordination   12/15/2021 Name: [name] MRN: [id number] DOB: 11-29-1955   Care Coordination Outreach Attempts:  A second unsuccessful outreach was attempted today to offer the patient with information about available care coordination services as a benefit of their health plan.     Follow Up Plan:  Additional outreach attempts will be made to offer the patient care coordination information and services.   Encounter Outcome:  No Answer  Care Coordination Interventions Activated:  No   Care Coordination Interventions:  No, not indicated    Kemper Durie, RN, MSN, [facility] Care Management Care Management Coordinator [phone]

## 2021-12-20 ENCOUNTER — Telehealth: Payer: Self-pay | Admitting: *Deleted

## 2021-12-20 DIAGNOSIS — G4700 Insomnia, unspecified: Secondary | ICD-10-CM | POA: Diagnosis not present

## 2021-12-20 DIAGNOSIS — M47812 Spondylosis without myelopathy or radiculopathy, cervical region: Secondary | ICD-10-CM | POA: Diagnosis not present

## 2021-12-20 DIAGNOSIS — G894 Chronic pain syndrome: Secondary | ICD-10-CM | POA: Diagnosis not present

## 2021-12-20 DIAGNOSIS — M47816 Spondylosis without myelopathy or radiculopathy, lumbar region: Secondary | ICD-10-CM | POA: Diagnosis not present

## 2021-12-20 NOTE — Patient Outreach (Signed)
  Care Coordination   12/20/2021 Name: Sonia Morales MRN: [id number] DOB: 10-Apr-1956   Care Coordination Outreach Attempts:  A third unsuccessful outreach was attempted today to offer the patient with information about available care coordination services as a benefit of their health plan.   Follow Up Plan:  No further outreach attempts will be made at this time. We have been unable to contact the patient to offer or enroll patient in care coordination services  Encounter Outcome:  No Answer  Care Coordination Interventions Activated:  No   Care Coordination Interventions:  No, not indicated    Kemper Durie, RN, MSN, [facility] Care Management Care Management Coordinator [phone]

## 2022-01-10 DIAGNOSIS — F17210 Nicotine dependence, cigarettes, uncomplicated: Secondary | ICD-10-CM | POA: Diagnosis not present

## 2022-01-10 DIAGNOSIS — Z20828 Contact with and (suspected) exposure to other viral communicable diseases: Secondary | ICD-10-CM | POA: Diagnosis not present

## 2022-01-10 DIAGNOSIS — E1165 Type 2 diabetes mellitus with hyperglycemia: Secondary | ICD-10-CM | POA: Diagnosis not present

## 2022-01-10 DIAGNOSIS — K2100 Gastro-esophageal reflux disease with esophagitis, without bleeding: Secondary | ICD-10-CM | POA: Diagnosis not present

## 2022-01-10 DIAGNOSIS — E039 Hypothyroidism, unspecified: Secondary | ICD-10-CM | POA: Diagnosis not present

## 2022-01-10 DIAGNOSIS — I25119 Atherosclerotic heart disease of native coronary artery with unspecified angina pectoris: Secondary | ICD-10-CM | POA: Diagnosis not present

## 2022-01-10 DIAGNOSIS — Z6834 Body mass index (BMI) 34.0-34.9, adult: Secondary | ICD-10-CM | POA: Diagnosis not present

## 2022-01-10 DIAGNOSIS — I10 Essential (primary) hypertension: Secondary | ICD-10-CM | POA: Diagnosis not present

## 2022-01-10 DIAGNOSIS — E7849 Other hyperlipidemia: Secondary | ICD-10-CM | POA: Diagnosis not present

## 2022-01-13 DIAGNOSIS — K2100 Gastro-esophageal reflux disease with esophagitis, without bleeding: Secondary | ICD-10-CM | POA: Diagnosis not present

## 2022-01-13 DIAGNOSIS — E1165 Type 2 diabetes mellitus with hyperglycemia: Secondary | ICD-10-CM | POA: Diagnosis not present

## 2022-01-13 DIAGNOSIS — I10 Essential (primary) hypertension: Secondary | ICD-10-CM | POA: Diagnosis not present

## 2022-01-13 DIAGNOSIS — I25119 Atherosclerotic heart disease of native coronary artery with unspecified angina pectoris: Secondary | ICD-10-CM | POA: Diagnosis not present

## 2022-01-16 DIAGNOSIS — E6609 Other obesity due to excess calories: Secondary | ICD-10-CM | POA: Diagnosis not present

## 2022-01-16 DIAGNOSIS — Z23 Encounter for immunization: Secondary | ICD-10-CM | POA: Diagnosis not present

## 2022-01-16 DIAGNOSIS — F17210 Nicotine dependence, cigarettes, uncomplicated: Secondary | ICD-10-CM | POA: Diagnosis not present

## 2022-01-16 DIAGNOSIS — F331 Major depressive disorder, recurrent, moderate: Secondary | ICD-10-CM | POA: Diagnosis not present

## 2022-01-16 DIAGNOSIS — E7849 Other hyperlipidemia: Secondary | ICD-10-CM | POA: Diagnosis not present

## 2022-01-16 DIAGNOSIS — I10 Essential (primary) hypertension: Secondary | ICD-10-CM | POA: Diagnosis not present

## 2022-01-16 DIAGNOSIS — E1165 Type 2 diabetes mellitus with hyperglycemia: Secondary | ICD-10-CM | POA: Diagnosis not present

## 2022-01-16 DIAGNOSIS — K2100 Gastro-esophageal reflux disease with esophagitis, without bleeding: Secondary | ICD-10-CM | POA: Diagnosis not present

## 2022-01-16 DIAGNOSIS — I25111 Atherosclerotic heart disease of native coronary artery with angina pectoris with documented spasm: Secondary | ICD-10-CM | POA: Diagnosis not present

## 2022-01-16 DIAGNOSIS — Z0001 Encounter for general adult medical examination with abnormal findings: Secondary | ICD-10-CM | POA: Diagnosis not present

## 2022-01-24 DIAGNOSIS — Z1231 Encounter for screening mammogram for malignant neoplasm of breast: Secondary | ICD-10-CM | POA: Diagnosis not present

## 2022-01-30 DIAGNOSIS — Z1211 Encounter for screening for malignant neoplasm of colon: Secondary | ICD-10-CM | POA: Diagnosis not present

## 2022-01-30 DIAGNOSIS — Z1212 Encounter for screening for malignant neoplasm of rectum: Secondary | ICD-10-CM | POA: Diagnosis not present

## 2022-02-07 DIAGNOSIS — M47816 Spondylosis without myelopathy or radiculopathy, lumbar region: Secondary | ICD-10-CM | POA: Diagnosis not present

## 2022-02-07 DIAGNOSIS — G4700 Insomnia, unspecified: Secondary | ICD-10-CM | POA: Diagnosis not present

## 2022-02-07 DIAGNOSIS — G894 Chronic pain syndrome: Secondary | ICD-10-CM | POA: Diagnosis not present

## 2022-02-07 DIAGNOSIS — M47812 Spondylosis without myelopathy or radiculopathy, cervical region: Secondary | ICD-10-CM | POA: Diagnosis not present

## 2022-02-12 ENCOUNTER — Encounter (INDEPENDENT_AMBULATORY_CARE_PROVIDER_SITE_OTHER): Payer: Self-pay | Admitting: *Deleted

## 2022-03-07 DIAGNOSIS — G894 Chronic pain syndrome: Secondary | ICD-10-CM | POA: Diagnosis not present

## 2022-03-07 DIAGNOSIS — G4700 Insomnia, unspecified: Secondary | ICD-10-CM | POA: Diagnosis not present

## 2022-03-07 DIAGNOSIS — M47816 Spondylosis without myelopathy or radiculopathy, lumbar region: Secondary | ICD-10-CM | POA: Diagnosis not present

## 2022-03-07 DIAGNOSIS — M47812 Spondylosis without myelopathy or radiculopathy, cervical region: Secondary | ICD-10-CM | POA: Diagnosis not present

## 2022-04-04 DIAGNOSIS — M47812 Spondylosis without myelopathy or radiculopathy, cervical region: Secondary | ICD-10-CM | POA: Diagnosis not present

## 2022-04-04 DIAGNOSIS — G894 Chronic pain syndrome: Secondary | ICD-10-CM | POA: Diagnosis not present

## 2022-04-04 DIAGNOSIS — M47816 Spondylosis without myelopathy or radiculopathy, lumbar region: Secondary | ICD-10-CM | POA: Diagnosis not present

## 2022-04-04 DIAGNOSIS — G4700 Insomnia, unspecified: Secondary | ICD-10-CM | POA: Diagnosis not present

## 2022-05-01 ENCOUNTER — Ambulatory Visit (INDEPENDENT_AMBULATORY_CARE_PROVIDER_SITE_OTHER): Payer: Medicare Other | Admitting: Gastroenterology

## 2022-05-01 ENCOUNTER — Encounter (INDEPENDENT_AMBULATORY_CARE_PROVIDER_SITE_OTHER): Payer: Self-pay | Admitting: *Deleted

## 2022-05-03 DIAGNOSIS — G894 Chronic pain syndrome: Secondary | ICD-10-CM | POA: Diagnosis not present

## 2022-05-03 DIAGNOSIS — M47816 Spondylosis without myelopathy or radiculopathy, lumbar region: Secondary | ICD-10-CM | POA: Diagnosis not present

## 2022-05-03 DIAGNOSIS — G4700 Insomnia, unspecified: Secondary | ICD-10-CM | POA: Diagnosis not present

## 2022-05-03 DIAGNOSIS — M47812 Spondylosis without myelopathy or radiculopathy, cervical region: Secondary | ICD-10-CM | POA: Diagnosis not present

## 2022-05-31 DIAGNOSIS — G894 Chronic pain syndrome: Secondary | ICD-10-CM | POA: Diagnosis not present

## 2022-05-31 DIAGNOSIS — M47812 Spondylosis without myelopathy or radiculopathy, cervical region: Secondary | ICD-10-CM | POA: Diagnosis not present

## 2022-05-31 DIAGNOSIS — M47816 Spondylosis without myelopathy or radiculopathy, lumbar region: Secondary | ICD-10-CM | POA: Diagnosis not present

## 2022-05-31 DIAGNOSIS — G4700 Insomnia, unspecified: Secondary | ICD-10-CM | POA: Diagnosis not present

## 2022-06-01 ENCOUNTER — Ambulatory Visit: Payer: Medicare Other | Admitting: Cardiology

## 2022-06-01 ENCOUNTER — Encounter: Payer: Self-pay | Admitting: Cardiology

## 2022-06-01 VITALS — BP 116/64 | HR 71 | Ht 63.0 in | Wt 191.0 lb

## 2022-06-01 DIAGNOSIS — E1122 Type 2 diabetes mellitus with diabetic chronic kidney disease: Secondary | ICD-10-CM

## 2022-06-01 DIAGNOSIS — I2510 Atherosclerotic heart disease of native coronary artery without angina pectoris: Secondary | ICD-10-CM

## 2022-06-01 DIAGNOSIS — N1831 Chronic kidney disease, stage 3a: Secondary | ICD-10-CM

## 2022-06-01 DIAGNOSIS — I255 Ischemic cardiomyopathy: Secondary | ICD-10-CM | POA: Diagnosis not present

## 2022-06-01 DIAGNOSIS — Z794 Long term (current) use of insulin: Secondary | ICD-10-CM | POA: Diagnosis not present

## 2022-06-01 DIAGNOSIS — E7800 Pure hypercholesterolemia, unspecified: Secondary | ICD-10-CM

## 2022-06-01 MED ORDER — LOSARTAN POTASSIUM 25 MG PO TABS: 25.0000 mg | ORAL_TABLET | Freq: Every evening | ORAL | 3 refills | Status: DC

## 2022-06-01 NOTE — Patient Instructions (Signed)
Please discontinue metoprolol, I am starting you on a new medication called losartan 25 mg daily in the evening.  After starting the new medication, I would like you to get blood work done on a fasting state, 6 to 8 hours of fasting and go to any [facility] whether in your community or any other place to have the blood work done, they will already have the order.

## 2022-06-01 NOTE — Progress Notes (Signed)
Primary Physician/Referring:  Caryl Bis, MD  Patient ID: Sonia Morales, female    DOB: 04/09/1956, 67 y.o.   MRN: [id number]  Chief Complaint  Patient presents with   Coronary Artery Disease   Follow-up   Cardiomyopathy   HPI:    Sonia Morales  is a 67 y.o. Caucasian female  with CAD, history of LAD stenting in 2017, tobacco use disorder in the past quit since angioplasty,  hypertension, hyperlipidemia, uncontrolled diabetes mellitus on insulin and ischemic cardiomyopathy with severe left main disease, underwent urgent CABG 2022.  Patient presents for 65-monthoffice visit.  She could not tolerate Jardiance due to recurrent UTI.  She is presently on Rybelsus.  Remains asymptomatic without chest pain. Diabetes continues to be uncontrolled.   Past Medical History:  Diagnosis Date   [place]   Acute MI anterior wall first episode care [facility] 10/04/2015   Coronary angiogram 10/04/2015: Occluded proximal LAD, superior takeoff of ramus intermediate, large circumflex, moderate to large RCA with mild diffuse disease. S/P thrombectomy, overlapping stents to the proximal LAD with 3.5 x 18, 3.5 x 12 mm Xience DES, 100% to 0%.    CAD (coronary artery disease), native coronary artery 10/11/2015   Coronary angiogram 10/04/2015: Left main mild calcification. LAD tortuous origin, occluded in the proximal segment. Diagonal 2 is moderate-sized, has proximal 50% stenosis. S/P thrombectomy & DES overlapping 3.5 x 18 and 3.5 x 12 mm Xience Alpine stents,   Ramus intermediate has a tortuous origin. Circumflex mild disease. Right coronary artery mild diffuse disease   Colitis    DM (diabetes mellitus), type 2, uncontrolled with complications [id number]  H/O tobacco use, presenting hazards to health 06/12/2018   Quit in 2017 after MI   Hypercholesteremia 06/12/2018   Social History   Tobacco Use   Smoking status: Former    Packs/day: 1.00    Years: 30.00    Total pack  years: 30.00    Types: Cigarettes    Quit date: 2017    Years since quitting: 7.1   Smokeless tobacco: Never  Substance Use Topics   Alcohol use: Never   Marital Status: Married   ROS  Review of Systems  Cardiovascular:  Positive for dyspnea on exertion. Negative for chest pain and leg swelling.   Objective      06/01/2022    1:17 PM 11/27/2021   10:25 AM 07/05/2021   10:03 AM  Vitals with BMI  Height 5' 3"$  5' 3"$  5' 3"$   Weight 191 lbs 193 lbs 190 lbs 3 oz  BMI 33.84 [id number] Systolic [id number]  Diastolic 64 70 61  Pulse 71 74 77    Physical Exam Constitutional:      Appearance: She is well-developed.     Comments: Mildly obese  Neck:     Vascular: No JVD.     Comments: Neck scar from cervical fusion left seen. Cardiovascular:     Rate and Rhythm: Normal rate and regular rhythm.     Pulses:          Carotid pulses are  on the right side with bruit and  on the left side with bruit.      Femoral pulses are 1+ on the right side and 1+ on the left side with bruit.      Popliteal pulses are 2+ on the right side and 2+ on the left side.       Dorsalis pedis  pulses are 2+ on the right side and 1+ on the left side.       Posterior tibial pulses are 0 on the right side and 0 on the left side.     Heart sounds: S1 normal and S2 normal. No murmur heard.    No gallop.  Pulmonary:     Effort: Pulmonary effort is normal. No respiratory distress.     Breath sounds: Normal breath sounds. No wheezing, rhonchi or rales.  Abdominal:     General: Bowel sounds are normal.     Palpations: Abdomen is soft.  Musculoskeletal:     Right lower leg: No edema.     Left lower leg: No edema.    Laboratory examination:   External labs:  Cholesterol, total 106.000 M 04/20/2021 HDL 43.000 mg 04/20/2021 LDL 47.000 MG 04/20/2021 Triglycerides 80.000 mg 04/20/2021  A1C 9.800 % 04/21/2021 TSH 1.444 12/31/2020  Hemoglobin 12.200 g/d 04/20/2021 Platelets 255.000 X1 04/20/2021  Creatinine, Serum 0.830  MG/ 04/20/2021 Potassium 4.900 mm 04/20/2021 ALT (SGPT) 14.000 IU/ 04/20/2021   Allergies   Allergies  Allergen Reactions   Jardiance [Empagliflozin] Other (See Comments)    Recurrent UTI     Final Medications at End of Visit     Current Outpatient Medications:    Accu-Chek Softclix Lancets lancets, Use as instructed to monitor glucose 4 times daily, Disp: 100 each, Rfl: 12   aspirin EC 81 MG EC tablet, Take 1 tablet (81 mg total) by mouth daily. Swallow whole., Disp: 30 tablet, Rfl: 11   atorvastatin (LIPITOR) 80 MG tablet, Take 1 tablet (80 mg total) by mouth daily at 6 PM. (Patient taking differently: Take 80 mg by mouth at bedtime.), Disp: 30 tablet, Rfl: 0   diclofenac Sodium (VOLTAREN) 1 % GEL, Apply 1 application  topically daily as needed (pain)., Disp: , Rfl:    FLUoxetine (PROZAC) 40 MG capsule, Take 2 capsules (80 mg total) by mouth 2 (two) times daily., Disp: , Rfl:    gabapentin (NEURONTIN) 300 MG capsule, Take 1 capsule (300 mg total) by mouth every other day., Disp: , Rfl:    glucose blood (ACCU-CHEK GUIDE) test strip, Use as instructed to monitor glucose 4 times daily, Disp: 100 each, Rfl: 12   insulin aspart protamine- aspart (NOVOLOG MIX 70/30) (70-30) 100 UNIT/ML injection, Inject 40 Units into the skin 2 (two) times daily., Disp: , Rfl:    insulin NPH-regular Human (70-30) 100 UNIT/ML injection, Inject 42 Units into the skin 2 (two) times daily with a meal., Disp: , Rfl:    losartan (COZAAR) 25 MG tablet, Take 1 tablet (25 mg total) by mouth every evening., Disp: 90 tablet, Rfl: 3   nitroGLYCERIN (NITROSTAT) 0.4 MG SL tablet, Place 0.4 mg under the tongue every 5 (five) minutes as needed for chest pain., Disp: , Rfl:    Oxycodone HCl 10 MG TABS, Take 10 mg by mouth 5 (five) times daily as needed (pain)., Disp: , Rfl:    Prasterone, DHEA, (DHEA 50 PO), Take 1 capsule by mouth daily., Disp: , Rfl:    Semaglutide (RYBELSUS) 7 MG TABS, Take by mouth daily at 6 (six) AM., Disp:  , Rfl:    traZODone (DESYREL) 50 MG tablet, Take 50 mg by mouth at bedtime. , Disp: , Rfl:   Radiology:  No results found.   Cardiac Studies:   Lexiscan Myoview Stress Test 12/29/2018: Lexiscan stress test was performed. Stress EKG is non-diagnostic, as this is pharmacological stress test. Perfusion images  reveal a very large size anterior, anteroseptal, apical and apical inferior transmural scar with moderate amount of ischemia especially at the basal septal region from base to mid ventricle.  Dynamic gated images reveal anterior, anteroseptal and apical akinesis. Stress LV EF is severely dysfunctional 29%.  High risk study. No prior studies for comparision.  Right and left heart cath 01/02/2021: LM: Distal LM bifurcation calcific 95% stenosis (1,0,1) LAD: Patent ostial LAD stent, 3.5 x 18 and 3.5 x 12 mm Xience Alpine  placed 10/04/2015 for STEMI.  Mid diffuse 30% disease Lcx: Ostial calcific 80% stenosis RCA: Mid 20% disease   Severe distal LM stenosis (1,0,1) Compensated ischemic cardiomyopathy    ABI 01/03/2021: Right ABI: Resting right ankle-brachial index is within normal range. No evidence of significant right lower extremity arterial disease.  Left ABI: Resting left ankle-brachial index is within normal range. No  evidence of significant left lower extremity arterial disease.     Carotid artery duplex 03/21/2021:  Duplex suggests stenosis in the right internal carotid artery (1-15%).  Duplex suggests stenosis in the left internal carotid artery (1-15%).  There is diffuse heterogenous plaque noted in bilateral common carotid and  internal carotid arteries.  Right vertebral artery flow is not visualized. Antegrade left vertebral artery flow.  Compared to the study done on 08/30/2020, bilateral ICA stenosis of around  50% no longer present.  Further studies if clinically indicated.  CABG x2 01/06/2021:  LIMA-LAD SVG-OM  PCV ECHOCARDIOGRAM COMPLETE  07/10/2021  Narrative Echocardiogram 07/10/2021: Study Quality: Technically difficult study. Mildly depressed LV systolic function with visual EF 40-45%. Left ventricle cavity is mildly dilated. Normal left ventricular wall thickness. Abnormal septal wall motion due to post-operative coronary artery bypass graft. Doppler evidence of grade I (impaired) diastolic dysfunction, normal LAP. Left ventricle regional wall motion findings: Basal anteroseptal, Mid anteroseptal, Mid inferior, Mid inferoseptal, Apical inferior, Apical septal and Apical cap hypokinesis. Compared to study 01/01/2021 LVEF improved from 30-35% to 40-45%, inferoseptal and apical inferior RWMA appears to be new, pericardial effusion has resolved, otherwise no significant change.  EKG   EKG 06/01/2022: Normal sinus rhythm at rate of 65 bpm,  left atrial enlargement, left axis deviation, left anterior fascicular block.  LVH with repolarization, cannot exclude lateral ischemia.  Compared to 05/08/2021, no significant change.  Assessment     ICD-10-CM   1. Coronary artery disease involving native coronary artery of native heart without angina pectoris  I25.10 EKG [id number]    Basic metabolic panel    2. Ischemic cardiomyopathy  I25.5 losartan (COZAAR) 25 MG tablet    3. Hypercholesteremia  E78.00 Lipid Panel With LDL/HDL Ratio    LDL cholesterol, direct    Lipoprotein A (LPA)    4. Type 2 diabetes mellitus without complication, with long-term current use of insulin (HCC)  E11.9 losartan (COZAAR) 25 MG tablet   Z79.4       Meds ordered this encounter  Medications   losartan (COZAAR) 25 MG tablet    Sig: Take 1 tablet (25 mg total) by mouth every evening.    Dispense:  90 tablet    Refill:  3   Medications Discontinued During This Encounter  Medication Reason   methocarbamol (ROBAXIN) 500 MG tablet Completed Course   Multiple Vitamin (MULTIVITAMIN) tablet Completed Course   DULoxetine (CYMBALTA) 30 MG capsule Change in  therapy   potassium chloride SA (KLOR-CON M) 20 MEQ tablet No longer needed (for PRN medications)   furosemide (LASIX) 40 MG tablet No longer  needed (for PRN medications)   empagliflozin (JARDIANCE) 25 MG TABS tablet Side effect (s)   clopidogrel (PLAVIX) 75 MG tablet Completed Course   metoprolol tartrate (LOPRESSOR) 25 MG tablet Change in therapy   Recommendations:    ISMAEL WNEK  is a 67 y.o. Caucasian female  with CAD, history of LAD stenting in 2017, tobacco use disorder in the past quit since angioplasty,  hypertension, hyperlipidemia, uncontrolled diabetes mellitus on insulin and ischemic cardiomyopathy with severe left main disease, underwent urgent CABG 2022 with LIMA to LAD and SVG to OM 1.  1. Coronary artery disease involving native coronary artery of native heart without angina pectoris Patient with coronary artery disease with no recurrence of angina pectoris, fortunately is clinically doing well and no changes in her EKG.  Continue present management.  She has been on DAPT for greater than a year now, since she has remained stable, will discontinue clopidogrel.  She will continue with aspirin indefinitely.  2. Ischemic cardiomyopathy No clinical evidence of heart failure.  LVEF is mildly depressed.  Her last echocardiogram was in March 2023, it has been a year since last echocardiogram, would like to repeat this to further evaluate her LV systolic function.  3. Primary hypertension Blood pressure under excellent control, no change in the medications were done today.  I am not sure if she indeed has hypertension as her blood pressure is very soft and she is presently only on 12.5 mg of metoprolol.  Discontinue this and place her on ARB losartan 25 mg in the evening.  4. Hypercholesteremia Lipids being managed by PCP, goal LDL <70, in fact or prefer LDL to be <55.  5. Type 2 diabetes mellitus with stage 3a chronic kidney disease, with long-term current use of insulin ([place]) She  is presently on this, she is tolerating this, but has not noticed any significant changes in her weight.  As she is tolerating the medication well, it would be more appropriate probably to try Ozempic, can be started at 1 mg dose as she is tolerating high dose of Rybelsus.  I will send a note to her PCP for this.  This was a 40-minute office visit encounter in review of her medications, review of external labs, coordination of care.   Adrian Prows, MD, [facility] 06/01/2022, 2:05 PM Office: [phone] Fax: [phone] Pager: [phone]

## 2022-06-19 DIAGNOSIS — I2510 Atherosclerotic heart disease of native coronary artery without angina pectoris: Secondary | ICD-10-CM | POA: Diagnosis not present

## 2022-06-19 DIAGNOSIS — E7800 Pure hypercholesterolemia, unspecified: Secondary | ICD-10-CM | POA: Diagnosis not present

## 2022-06-22 ENCOUNTER — Encounter: Payer: Self-pay | Admitting: Cardiology

## 2022-06-22 LAB — BASIC METABOLIC PANEL
BUN/Creatinine Ratio: 24 (ref 12–28)
BUN: 23 mg/dL (ref 8–27)
CO2: 23 mmol/L (ref 20–29)
Calcium: 10 mg/dL (ref 8.7–10.3)
Chloride: 99 mmol/L (ref 96–106)
Creatinine, Ser: 0.95 mg/dL (ref 0.57–1.00)
Glucose: 308 mg/dL — ABNORMAL HIGH (ref 70–99)
Potassium: 5.8 mmol/L — ABNORMAL HIGH (ref 3.5–5.2)
Sodium: 137 mmol/L (ref 134–144)
eGFR: 66 mL/min/{1.73_m2} (ref >59)

## 2022-06-22 LAB — LDL CHOLESTEROL, DIRECT: LDL Direct: 54 mg/dL (ref 0–99)

## 2022-06-22 LAB — LIPID PANEL WITH LDL/HDL RATIO
Cholesterol, Total: 130 mg/dL (ref 100–199)
HDL: 57 mg/dL (ref >39)
LDL Chol Calc (NIH): 55 mg/dL (ref 0–99)
LDL/HDL Ratio: 1 ratio (ref 0.0–3.2)
Triglycerides: 96 mg/dL (ref 0–149)
VLDL Cholesterol Cal: 18 mg/dL (ref 5–40)

## 2022-06-22 LAB — LIPOPROTEIN A (LPA): Lipoprotein (a): 31.4 nmol/L (ref <75.0)

## 2022-06-22 NOTE — Addendum Note (Signed)
Addended by: Kela Millin on: 06/22/2022 05:25 PM   Modules accepted: Orders

## 2022-06-28 DIAGNOSIS — M47816 Spondylosis without myelopathy or radiculopathy, lumbar region: Secondary | ICD-10-CM | POA: Diagnosis not present

## 2022-06-28 DIAGNOSIS — G4700 Insomnia, unspecified: Secondary | ICD-10-CM | POA: Diagnosis not present

## 2022-06-28 DIAGNOSIS — M47812 Spondylosis without myelopathy or radiculopathy, cervical region: Secondary | ICD-10-CM | POA: Diagnosis not present

## 2022-06-28 DIAGNOSIS — G894 Chronic pain syndrome: Secondary | ICD-10-CM | POA: Diagnosis not present

## 2022-07-09 DIAGNOSIS — E1165 Type 2 diabetes mellitus with hyperglycemia: Secondary | ICD-10-CM | POA: Diagnosis not present

## 2022-07-09 DIAGNOSIS — E782 Mixed hyperlipidemia: Secondary | ICD-10-CM | POA: Diagnosis not present

## 2022-07-09 DIAGNOSIS — I10 Essential (primary) hypertension: Secondary | ICD-10-CM | POA: Diagnosis not present

## 2022-07-09 DIAGNOSIS — E039 Hypothyroidism, unspecified: Secondary | ICD-10-CM | POA: Diagnosis not present

## 2022-07-09 DIAGNOSIS — K2100 Gastro-esophageal reflux disease with esophagitis, without bleeding: Secondary | ICD-10-CM | POA: Diagnosis not present

## 2022-07-10 DIAGNOSIS — I10 Essential (primary) hypertension: Secondary | ICD-10-CM | POA: Diagnosis not present

## 2022-07-10 DIAGNOSIS — K2100 Gastro-esophageal reflux disease with esophagitis, without bleeding: Secondary | ICD-10-CM | POA: Diagnosis not present

## 2022-07-10 DIAGNOSIS — E1165 Type 2 diabetes mellitus with hyperglycemia: Secondary | ICD-10-CM | POA: Diagnosis not present

## 2022-07-10 DIAGNOSIS — E039 Hypothyroidism, unspecified: Secondary | ICD-10-CM | POA: Diagnosis not present

## 2022-07-10 DIAGNOSIS — E782 Mixed hyperlipidemia: Secondary | ICD-10-CM | POA: Diagnosis not present

## 2022-07-16 DIAGNOSIS — I25119 Atherosclerotic heart disease of native coronary artery with unspecified angina pectoris: Secondary | ICD-10-CM | POA: Diagnosis not present

## 2022-07-16 DIAGNOSIS — E7849 Other hyperlipidemia: Secondary | ICD-10-CM | POA: Diagnosis not present

## 2022-07-16 DIAGNOSIS — I10 Essential (primary) hypertension: Secondary | ICD-10-CM | POA: Diagnosis not present

## 2022-07-16 DIAGNOSIS — K2100 Gastro-esophageal reflux disease with esophagitis, without bleeding: Secondary | ICD-10-CM | POA: Diagnosis not present

## 2022-07-16 DIAGNOSIS — E1165 Type 2 diabetes mellitus with hyperglycemia: Secondary | ICD-10-CM | POA: Diagnosis not present

## 2022-07-16 DIAGNOSIS — F331 Major depressive disorder, recurrent, moderate: Secondary | ICD-10-CM | POA: Diagnosis not present

## 2022-07-16 DIAGNOSIS — Z6834 Body mass index (BMI) 34.0-34.9, adult: Secondary | ICD-10-CM | POA: Diagnosis not present

## 2022-07-16 DIAGNOSIS — F17210 Nicotine dependence, cigarettes, uncomplicated: Secondary | ICD-10-CM | POA: Diagnosis not present

## 2022-07-16 DIAGNOSIS — E6609 Other obesity due to excess calories: Secondary | ICD-10-CM | POA: Diagnosis not present

## 2022-07-26 DIAGNOSIS — G4700 Insomnia, unspecified: Secondary | ICD-10-CM | POA: Diagnosis not present

## 2022-07-26 DIAGNOSIS — G894 Chronic pain syndrome: Secondary | ICD-10-CM | POA: Diagnosis not present

## 2022-07-26 DIAGNOSIS — M47812 Spondylosis without myelopathy or radiculopathy, cervical region: Secondary | ICD-10-CM | POA: Diagnosis not present

## 2022-07-26 DIAGNOSIS — M47816 Spondylosis without myelopathy or radiculopathy, lumbar region: Secondary | ICD-10-CM | POA: Diagnosis not present

## 2022-08-23 DIAGNOSIS — G894 Chronic pain syndrome: Secondary | ICD-10-CM | POA: Diagnosis not present

## 2022-08-23 DIAGNOSIS — M47816 Spondylosis without myelopathy or radiculopathy, lumbar region: Secondary | ICD-10-CM | POA: Diagnosis not present

## 2022-08-23 DIAGNOSIS — G4700 Insomnia, unspecified: Secondary | ICD-10-CM | POA: Diagnosis not present

## 2022-08-23 DIAGNOSIS — Z79891 Long term (current) use of opiate analgesic: Secondary | ICD-10-CM | POA: Diagnosis not present

## 2022-08-23 DIAGNOSIS — M47812 Spondylosis without myelopathy or radiculopathy, cervical region: Secondary | ICD-10-CM | POA: Diagnosis not present

## 2022-09-20 DIAGNOSIS — G894 Chronic pain syndrome: Secondary | ICD-10-CM | POA: Diagnosis not present

## 2022-09-20 DIAGNOSIS — M47816 Spondylosis without myelopathy or radiculopathy, lumbar region: Secondary | ICD-10-CM | POA: Diagnosis not present

## 2022-09-20 DIAGNOSIS — M47812 Spondylosis without myelopathy or radiculopathy, cervical region: Secondary | ICD-10-CM | POA: Diagnosis not present

## 2022-09-20 DIAGNOSIS — G4700 Insomnia, unspecified: Secondary | ICD-10-CM | POA: Diagnosis not present

## 2022-10-17 DIAGNOSIS — M47812 Spondylosis without myelopathy or radiculopathy, cervical region: Secondary | ICD-10-CM | POA: Diagnosis not present

## 2022-10-17 DIAGNOSIS — G4700 Insomnia, unspecified: Secondary | ICD-10-CM | POA: Diagnosis not present

## 2022-10-17 DIAGNOSIS — M47816 Spondylosis without myelopathy or radiculopathy, lumbar region: Secondary | ICD-10-CM | POA: Diagnosis not present

## 2022-10-17 DIAGNOSIS — G894 Chronic pain syndrome: Secondary | ICD-10-CM | POA: Diagnosis not present

## 2022-11-07 DIAGNOSIS — I10 Essential (primary) hypertension: Secondary | ICD-10-CM | POA: Diagnosis not present

## 2022-11-07 DIAGNOSIS — E039 Hypothyroidism, unspecified: Secondary | ICD-10-CM | POA: Diagnosis not present

## 2022-11-07 DIAGNOSIS — E7849 Other hyperlipidemia: Secondary | ICD-10-CM | POA: Diagnosis not present

## 2022-11-07 DIAGNOSIS — K2100 Gastro-esophageal reflux disease with esophagitis, without bleeding: Secondary | ICD-10-CM | POA: Diagnosis not present

## 2022-11-07 DIAGNOSIS — E1165 Type 2 diabetes mellitus with hyperglycemia: Secondary | ICD-10-CM | POA: Diagnosis not present

## 2022-11-07 DIAGNOSIS — E119 Type 2 diabetes mellitus without complications: Secondary | ICD-10-CM | POA: Diagnosis not present

## 2022-11-14 DIAGNOSIS — F331 Major depressive disorder, recurrent, moderate: Secondary | ICD-10-CM | POA: Diagnosis not present

## 2022-11-14 DIAGNOSIS — I10 Essential (primary) hypertension: Secondary | ICD-10-CM | POA: Diagnosis not present

## 2022-11-14 DIAGNOSIS — G4700 Insomnia, unspecified: Secondary | ICD-10-CM | POA: Diagnosis not present

## 2022-11-14 DIAGNOSIS — K2100 Gastro-esophageal reflux disease with esophagitis, without bleeding: Secondary | ICD-10-CM | POA: Diagnosis not present

## 2022-11-14 DIAGNOSIS — M47812 Spondylosis without myelopathy or radiculopathy, cervical region: Secondary | ICD-10-CM | POA: Diagnosis not present

## 2022-11-14 DIAGNOSIS — E6609 Other obesity due to excess calories: Secondary | ICD-10-CM | POA: Diagnosis not present

## 2022-11-14 DIAGNOSIS — R059 Cough, unspecified: Secondary | ICD-10-CM | POA: Diagnosis not present

## 2022-11-14 DIAGNOSIS — G894 Chronic pain syndrome: Secondary | ICD-10-CM | POA: Diagnosis not present

## 2022-11-14 DIAGNOSIS — I25119 Atherosclerotic heart disease of native coronary artery with unspecified angina pectoris: Secondary | ICD-10-CM | POA: Diagnosis not present

## 2022-11-14 DIAGNOSIS — M47816 Spondylosis without myelopathy or radiculopathy, lumbar region: Secondary | ICD-10-CM | POA: Diagnosis not present

## 2022-11-14 DIAGNOSIS — Z6834 Body mass index (BMI) 34.0-34.9, adult: Secondary | ICD-10-CM | POA: Diagnosis not present

## 2022-11-14 DIAGNOSIS — E7849 Other hyperlipidemia: Secondary | ICD-10-CM | POA: Diagnosis not present

## 2022-11-14 DIAGNOSIS — E1165 Type 2 diabetes mellitus with hyperglycemia: Secondary | ICD-10-CM | POA: Diagnosis not present

## 2022-11-14 DIAGNOSIS — F17210 Nicotine dependence, cigarettes, uncomplicated: Secondary | ICD-10-CM | POA: Diagnosis not present

## 2022-12-03 ENCOUNTER — Ambulatory Visit: Payer: Medicare Other | Admitting: Cardiology

## 2022-12-03 ENCOUNTER — Encounter: Payer: Self-pay | Admitting: Cardiology

## 2022-12-03 VITALS — BP 100/59 | HR 94 | Resp 16 | Ht 63.0 in | Wt 187.4 lb

## 2022-12-03 DIAGNOSIS — I2510 Atherosclerotic heart disease of native coronary artery without angina pectoris: Secondary | ICD-10-CM

## 2022-12-03 DIAGNOSIS — E1122 Type 2 diabetes mellitus with diabetic chronic kidney disease: Secondary | ICD-10-CM

## 2022-12-03 DIAGNOSIS — G903 Multi-system degeneration of the autonomic nervous system: Secondary | ICD-10-CM | POA: Diagnosis not present

## 2022-12-03 DIAGNOSIS — I129 Hypertensive chronic kidney disease with stage 1 through stage 4 chronic kidney disease, or unspecified chronic kidney disease: Secondary | ICD-10-CM | POA: Diagnosis not present

## 2022-12-03 DIAGNOSIS — N1831 Chronic kidney disease, stage 3a: Secondary | ICD-10-CM | POA: Diagnosis not present

## 2022-12-03 DIAGNOSIS — R0989 Other specified symptoms and signs involving the circulatory and respiratory systems: Secondary | ICD-10-CM

## 2022-12-03 DIAGNOSIS — Z794 Long term (current) use of insulin: Secondary | ICD-10-CM | POA: Diagnosis not present

## 2022-12-03 MED ORDER — NITROGLYCERIN 0.4 MG SL SUBL: 0.4000 mg | SUBLINGUAL_TABLET | SUBLINGUAL | 2 refills | Status: AC | PRN

## 2022-12-03 NOTE — Progress Notes (Signed)
Primary Physician/Referring:  Richardean Chimera, MD  Patient ID: Sonia Morales, female    DOB: 03-06-1956, 67 y.o.   MRN: [id number]  Chief Complaint  Patient presents with   Coronary artery disease involving native coronary artery of   HPI:    Sonia Morales  is a 67 y.o. Caucasian female  with CAD, history of LAD stenting in 2017, tobacco use disorder in the past quit since angioplasty,  hypertension, hyperlipidemia, uncontrolled diabetes mellitus on insulin and ischemic cardiomyopathy with severe left main disease, underwent urgent CABG 2022.  65-month office visit.  Remains asymptomatic without chest pain. Diabetes continues to be uncontrolled.   Past Medical History:  Diagnosis Date   Acute anterior wall MI (HCC) 10/04/2015   Acute MI anterior wall first episode care [facility]) 10/04/2015   Coronary angiogram 10/04/2015: Occluded proximal LAD, superior takeoff of ramus intermediate, large circumflex, moderate to large RCA with mild diffuse disease. S/P thrombectomy, overlapping stents to the proximal LAD with 3.5 x 18, 3.5 x 12 mm Xience DES, 100% to 0%.    CAD (coronary artery disease), native coronary artery 10/11/2015   Coronary angiogram 10/04/2015: Left main mild calcification. LAD tortuous origin, occluded in the proximal segment. Diagonal 2 is moderate-sized, has proximal 50% stenosis. S/P thrombectomy & DES overlapping 3.5 x 18 and 3.5 x 12 mm Xience Alpine stents,   Ramus intermediate has a tortuous origin. Circumflex mild disease. Right coronary artery mild diffuse disease   Colitis    DM (diabetes mellitus), type 2, uncontrolled with complications 06/12/2018   H/O tobacco use, presenting hazards to health 06/12/2018   Quit in 2017 after MI   Hypercholesteremia 06/12/2018   Social History   Tobacco Use   Smoking status: Former    Current packs/day: 0.00    Average packs/day: 1 pack/day for 30.0 years (30.0 ttl pk-yrs)    Types: Cigarettes    Start date: 60     Quit date: 2017    Years since quitting: 7.6   Smokeless tobacco: Never  Substance Use Topics   Alcohol use: Never   Marital Status: Married   ROS  Review of Systems  Cardiovascular:  Negative for chest pain, dyspnea on exertion and leg swelling.   Objective      12/03/2022    1:00 PM 06/01/2022    1:17 PM 11/27/2021   10:25 AM  Vitals with BMI  Height 5\' 3"  5\' 3"  5\' 3"   Weight 187 lbs 6 oz 191 lbs 193 lbs  BMI 33.2 33.84 34.2  Systolic 100 116 284  Diastolic 59 64 70  Pulse 94 71 74    Physical Exam Constitutional:      Appearance: She is well-developed.     Comments: Mildly obese  Neck:     Vascular: Carotid bruit (bilateral) present. No JVD.     Comments: Neck scar from cervical fusion left seen. Cardiovascular:     Rate and Rhythm: Normal rate and regular rhythm.     Pulses:          Carotid pulses are  on the right side with bruit and  on the left side with bruit.      Femoral pulses are 1+ on the right side and 1+ on the left side with bruit.      Popliteal pulses are 2+ on the right side and 2+ on the left side.       Dorsalis pedis pulses are 2+ on the right  side and 1+ on the left side.       Posterior tibial pulses are 0 on the right side and 0 on the left side.     Heart sounds: S1 normal and S2 normal. Murmur heard.     Early systolic murmur is present with a grade of 2/6 at the upper right sternal border radiating to the neck.     No gallop.  Pulmonary:     Effort: Pulmonary effort is normal. No respiratory distress.     Breath sounds: Normal breath sounds. No wheezing, rhonchi or rales.  Abdominal:     General: Bowel sounds are normal.     Palpations: Abdomen is soft.  Musculoskeletal:     Right lower leg: No edema.     Left lower leg: No edema.    Laboratory examination:   External labs:  Cholesterol, total 106.000 M 04/20/2021 HDL 43.000 mg 04/20/2021 LDL 47.000 MG 04/20/2021 Triglycerides 80.000 mg 04/20/2021  A1C 9.800 % 04/21/2021 TSH 1.444  12/31/2020  Hemoglobin 12.200 g/d 04/20/2021 Platelets 255.000 X1 04/20/2021  Creatinine, Serum 0.830 MG/ 04/20/2021 Potassium 4.900 mm 04/20/2021 ALT (SGPT) 14.000 IU/ 04/20/2021  Radiology:  No results found.   Cardiac Studies:   Lexiscan Myoview Stress Test 12/29/2018: Lexiscan stress test was performed. Stress EKG is non-diagnostic, as this is pharmacological stress test. Perfusion images reveal a very large size anterior, anteroseptal, apical and apical inferior transmural scar with moderate amount of ischemia especially at the basal septal region from base to mid ventricle.  Dynamic gated images reveal anterior, anteroseptal and apical akinesis. Stress LV EF is severely dysfunctional 29%.  High risk study. No prior studies for comparision.  Right and left heart cath 01/02/2021: LM: Distal LM bifurcation calcific 95% stenosis (1,0,1) LAD: Patent ostial LAD stent, 3.5 x 18 and 3.5 x 12 mm Xience Alpine  placed 10/04/2015 for STEMI.  Mid diffuse 30% disease Lcx: Ostial calcific 80% stenosis RCA: Mid 20% disease   Severe distal LM stenosis (1,0,1) Compensated ischemic cardiomyopathy    ABI 01/03/2021: Right ABI: Resting right ankle-brachial index is within normal range. No evidence of significant right lower extremity arterial disease.  Left ABI: Resting left ankle-brachial index is within normal range. No  evidence of significant left lower extremity arterial disease.     CABG x2 01/06/2021:  LIMA-LAD SVG-OM  Carotid artery duplex 03/21/2021: Duplex suggests stenosis in the right internal carotid artery (1-15%). Duplex suggests stenosis in the left internal carotid artery (1-15%). There is diffuse heterogenous plaque noted in bilateral common carotid and internal carotid arteries. Right vertebral artery flow is not visualized. Antegrade left vertebral artery flow. Compared to the study done on 08/30/2020, bilateral ICA stenosis of around 50% no longer present.  Further studies if  clinically indicated.  PCV ECHOCARDIOGRAM COMPLETE 07/10/2021  Narrative Echocardiogram 07/10/2021: Study Quality: Technically difficult study. Mildly depressed LV systolic function with visual EF 40-45%. Left ventricle cavity is mildly dilated. Normal left ventricular wall thickness. Abnormal septal wall motion due to post-operative coronary artery bypass graft. Doppler evidence of grade I (impaired) diastolic dysfunction, normal LAP. Left ventricle regional wall motion findings: Basal anteroseptal, Mid anteroseptal, Mid inferior, Mid inferoseptal, Apical inferior, Apical septal and Apical cap hypokinesis. Compared to study 01/01/2021 LVEF improved from 30-35% to 40-45%, inferoseptal and apical inferior RWMA appears to be new, pericardial effusion has resolved, otherwise no significant change.  EKG   EKG 12/03/2022: Normal sinus rhythm at the rate of 82 bpm, left atrial lodgment, left intrafascicular block.  LVH.  Nonspecific T abnormality.  Cannot exclude high lateral ischemia.  Compared to 06/01/2022, no significant change.  Allergies   Allergies  Allergen Reactions   Jardiance [Empagliflozin] Other (See Comments)    Recurrent UTI   Losartan Other (See Comments)    Hyperkalemia    Current Outpatient Medications:    Accu-Chek Softclix Lancets lancets, Use as instructed to monitor glucose 4 times daily, Disp: 100 each, Rfl: 12   aspirin EC 81 MG EC tablet, Take 1 tablet (81 mg total) by mouth daily. Swallow whole., Disp: 30 tablet, Rfl: 11   atorvastatin (LIPITOR) 80 MG tablet, Take 1 tablet (80 mg total) by mouth daily at 6 PM. (Patient taking differently: Take 80 mg by mouth at bedtime.), Disp: 30 tablet, Rfl: 0   diclofenac Sodium (VOLTAREN) 1 % GEL, Apply 1 application  topically daily as needed (pain)., Disp: , Rfl:    FLUoxetine (PROZAC) 40 MG capsule, Take 2 capsules (80 mg total) by mouth 2 (two) times daily., Disp: , Rfl:    gabapentin (NEURONTIN) 600 MG tablet, Take 600 mg by  mouth 3 (three) times daily., Disp: , Rfl:    glucose blood (ACCU-CHEK GUIDE) test strip, Use as instructed to monitor glucose 4 times daily, Disp: 100 each, Rfl: 12   insulin aspart protamine- aspart (NOVOLOG MIX 70/30) (70-30) 100 UNIT/ML injection, Inject 40 Units into the skin 2 (two) times daily., Disp: , Rfl:    insulin NPH-regular Human (70-30) 100 UNIT/ML injection, Inject 42 Units into the skin 2 (two) times daily with a meal., Disp: , Rfl:    Oxycodone HCl 10 MG TABS, Take 10 mg by mouth 5 (five) times daily as needed (pain)., Disp: , Rfl:    Semaglutide, 1 MG/DOSE, (OZEMPIC, 1 MG/DOSE,) 4 MG/3ML SOPN, Inject 1 mg into the skin once a week., Disp: , Rfl:    traZODone (DESYREL) 50 MG tablet, Take 50 mg by mouth at bedtime. , Disp: , Rfl:    nitroGLYCERIN (NITROSTAT) 0.4 MG SL tablet, Place 1 tablet (0.4 mg total) under the tongue every 5 (five) minutes as needed for chest pain., Disp: 25 tablet, Rfl: 2   Assessment     ICD-10-CM   1. Coronary artery disease involving native coronary artery of native heart without angina pectoris  I25.10 EKG 12-Lead    nitroGLYCERIN (NITROSTAT) 0.4 MG SL tablet    2. Neurogenic orthostatic hypotension (HCC)  G90.3     3. Type 2 diabetes mellitus with stage 3a chronic kidney disease, with long-term current use of insulin (HCC)  E11.22    N18.31    Z79.4     4. Bilateral carotid bruits  R09.89       Meds ordered this encounter  Medications   nitroGLYCERIN (NITROSTAT) 0.4 MG SL tablet    Sig: Place 1 tablet (0.4 mg total) under the tongue every 5 (five) minutes as needed for chest pain.    Dispense:  25 tablet    Refill:  2   Medications Discontinued During This Encounter  Medication Reason   Prasterone, DHEA, (DHEA 50 PO)    gabapentin (NEURONTIN) 300 MG capsule    Semaglutide (RYBELSUS) 7 MG TABS Change in therapy   nitroGLYCERIN (NITROSTAT) 0.4 MG SL tablet Reorder   Recommendations:    Sonia Morales  is a 67 y.o. Caucasian female   with CAD, history of LAD stenting in 2017, tobacco use disorder in the past quit since angioplasty,  hypertension, hyperlipidemia, uncontrolled diabetes mellitus  on insulin and ischemic cardiomyopathy with severe left main disease, underwent urgent CABG 2022 with LIMA to LAD and SVG to OM 1.  1. Coronary artery disease involving native coronary artery of native heart without angina pectoris Patient is currently doing well without recurrence of angina pectoris.  However she prefers to have nitroglycerin which I refilled today.  No change in the EKG. - EKG 12-Lead - nitroGLYCERIN (NITROSTAT) 0.4 MG SL tablet; Place 1 tablet (0.4 mg total) under the tongue every 5 (five) minutes as needed for chest pain.  Dispense: 25 tablet; Refill: 2  2. Neurogenic orthostatic hypotension (HCC) Patient has neurogenic orthostatic hypotension secondary to diabetes mellitus.  Hence she is not on an ACE inhibitor or an ARB.  3. Type 2 diabetes mellitus with stage 3a chronic kidney disease, with long-term current use of insulin (HCC) Presently on Ozempic, her weight has stabilized, we have discussed regarding weight loss and eating slow and also taking a break in between her meals to help improve satiety.  4. Bilateral carotid bruits She does have bilateral carotid bruit that are prominent, she has had multiple carotid artery duplex in the past which revealed no significant disease but moderate atherosclerotic plaque.  She is presently on high intensity statin, continue the same.  No issues with regard to TIA.  She is also on aspirin 81 mg daily.  I will see her back on annual basis.    Yates Decamp, MD, [facility] 12/03/2022, 1:50 PM Office: [phone] Fax: [phone] Pager: [phone]

## 2022-12-12 DIAGNOSIS — G4700 Insomnia, unspecified: Secondary | ICD-10-CM | POA: Diagnosis not present

## 2022-12-12 DIAGNOSIS — G894 Chronic pain syndrome: Secondary | ICD-10-CM | POA: Diagnosis not present

## 2022-12-12 DIAGNOSIS — M47812 Spondylosis without myelopathy or radiculopathy, cervical region: Secondary | ICD-10-CM | POA: Diagnosis not present

## 2022-12-12 DIAGNOSIS — M47816 Spondylosis without myelopathy or radiculopathy, lumbar region: Secondary | ICD-10-CM | POA: Diagnosis not present

## 2023-01-01 DIAGNOSIS — R300 Dysuria: Secondary | ICD-10-CM | POA: Diagnosis not present

## 2023-01-03 ENCOUNTER — Encounter: Payer: Self-pay | Admitting: Cardiology

## 2023-01-07 DIAGNOSIS — R300 Dysuria: Secondary | ICD-10-CM | POA: Diagnosis not present

## 2023-01-25 DIAGNOSIS — F17210 Nicotine dependence, cigarettes, uncomplicated: Secondary | ICD-10-CM | POA: Diagnosis not present

## 2023-01-25 DIAGNOSIS — R300 Dysuria: Secondary | ICD-10-CM | POA: Diagnosis not present

## 2023-01-25 DIAGNOSIS — R319 Hematuria, unspecified: Secondary | ICD-10-CM | POA: Diagnosis not present

## 2023-01-25 DIAGNOSIS — Z90710 Acquired absence of both cervix and uterus: Secondary | ICD-10-CM | POA: Diagnosis not present

## 2023-01-25 DIAGNOSIS — Z6834 Body mass index (BMI) 34.0-34.9, adult: Secondary | ICD-10-CM | POA: Diagnosis not present

## 2023-01-25 DIAGNOSIS — R030 Elevated blood-pressure reading, without diagnosis of hypertension: Secondary | ICD-10-CM | POA: Diagnosis not present

## 2023-01-25 DIAGNOSIS — N200 Calculus of kidney: Secondary | ICD-10-CM | POA: Diagnosis not present

## 2023-01-29 DIAGNOSIS — M47816 Spondylosis without myelopathy or radiculopathy, lumbar region: Secondary | ICD-10-CM | POA: Diagnosis not present

## 2023-02-06 DIAGNOSIS — G4700 Insomnia, unspecified: Secondary | ICD-10-CM | POA: Diagnosis not present

## 2023-02-06 DIAGNOSIS — M47816 Spondylosis without myelopathy or radiculopathy, lumbar region: Secondary | ICD-10-CM | POA: Diagnosis not present

## 2023-02-06 DIAGNOSIS — G894 Chronic pain syndrome: Secondary | ICD-10-CM | POA: Diagnosis not present

## 2023-02-06 DIAGNOSIS — M47812 Spondylosis without myelopathy or radiculopathy, cervical region: Secondary | ICD-10-CM | POA: Diagnosis not present

## 2023-03-06 ENCOUNTER — Other Ambulatory Visit: Payer: Self-pay

## 2023-03-06 ENCOUNTER — Other Ambulatory Visit (HOSPITAL_COMMUNITY): Payer: Self-pay | Admitting: *Deleted

## 2023-03-06 ENCOUNTER — Inpatient Hospital Stay (HOSPITAL_COMMUNITY)
Admission: EM | Admit: 2023-03-06 | Discharge: 2023-03-13 | DRG: 280 | Disposition: A | Discharge status: Home / Self Care | Payer: Medicare Other | Attending: Internal Medicine | Admitting: Internal Medicine

## 2023-03-06 ENCOUNTER — Observation Stay (HOSPITAL_COMMUNITY): Payer: Medicare Other

## 2023-03-06 ENCOUNTER — Emergency Department (HOSPITAL_COMMUNITY): Payer: Medicare Other

## 2023-03-06 ENCOUNTER — Encounter (HOSPITAL_COMMUNITY)
Admission: EM | Disposition: A | Discharge status: Home / Self Care | Payer: Self-pay | Source: Home / Self Care | Attending: Internal Medicine

## 2023-03-06 DIAGNOSIS — R0602 Shortness of breath: Secondary | ICD-10-CM | POA: Diagnosis not present

## 2023-03-06 DIAGNOSIS — Z79899 Other long term (current) drug therapy: Secondary | ICD-10-CM | POA: Diagnosis not present

## 2023-03-06 DIAGNOSIS — J811 Chronic pulmonary edema: Secondary | ICD-10-CM | POA: Diagnosis not present

## 2023-03-06 DIAGNOSIS — I509 Heart failure, unspecified: Secondary | ICD-10-CM

## 2023-03-06 DIAGNOSIS — B9620 Unspecified Escherichia coli [E. coli] as the cause of diseases classified elsewhere: Secondary | ICD-10-CM | POA: Diagnosis present

## 2023-03-06 DIAGNOSIS — Z794 Long term (current) use of insulin: Secondary | ICD-10-CM

## 2023-03-06 DIAGNOSIS — R079 Chest pain, unspecified: Secondary | ICD-10-CM | POA: Diagnosis not present

## 2023-03-06 DIAGNOSIS — I5082 Biventricular heart failure: Secondary | ICD-10-CM | POA: Diagnosis not present

## 2023-03-06 DIAGNOSIS — N200 Calculus of kidney: Secondary | ICD-10-CM | POA: Diagnosis present

## 2023-03-06 DIAGNOSIS — D649 Anemia, unspecified: Secondary | ICD-10-CM | POA: Diagnosis present

## 2023-03-06 DIAGNOSIS — I5023 Acute on chronic systolic (congestive) heart failure: Secondary | ICD-10-CM | POA: Diagnosis present

## 2023-03-06 DIAGNOSIS — I2510 Atherosclerotic heart disease of native coronary artery without angina pectoris: Secondary | ICD-10-CM | POA: Diagnosis not present

## 2023-03-06 DIAGNOSIS — Z7982 Long term (current) use of aspirin: Secondary | ICD-10-CM | POA: Diagnosis not present

## 2023-03-06 DIAGNOSIS — I272 Pulmonary hypertension, unspecified: Secondary | ICD-10-CM | POA: Diagnosis present

## 2023-03-06 DIAGNOSIS — R0902 Hypoxemia: Secondary | ICD-10-CM | POA: Diagnosis not present

## 2023-03-06 DIAGNOSIS — Z87891 Personal history of nicotine dependence: Secondary | ICD-10-CM | POA: Diagnosis not present

## 2023-03-06 DIAGNOSIS — N179 Acute kidney failure, unspecified: Secondary | ICD-10-CM | POA: Diagnosis present

## 2023-03-06 DIAGNOSIS — Z8249 Family history of ischemic heart disease and other diseases of the circulatory system: Secondary | ICD-10-CM

## 2023-03-06 DIAGNOSIS — E7800 Pure hypercholesterolemia, unspecified: Secondary | ICD-10-CM | POA: Diagnosis present

## 2023-03-06 DIAGNOSIS — Z951 Presence of aortocoronary bypass graft: Secondary | ICD-10-CM | POA: Diagnosis not present

## 2023-03-06 DIAGNOSIS — J439 Emphysema, unspecified: Secondary | ICD-10-CM | POA: Diagnosis not present

## 2023-03-06 DIAGNOSIS — I214 Non-ST elevation (NSTEMI) myocardial infarction: Principal | ICD-10-CM | POA: Diagnosis present

## 2023-03-06 DIAGNOSIS — E118 Type 2 diabetes mellitus with unspecified complications: Secondary | ICD-10-CM | POA: Diagnosis not present

## 2023-03-06 DIAGNOSIS — R140 Abdominal distension (gaseous): Secondary | ICD-10-CM | POA: Diagnosis not present

## 2023-03-06 DIAGNOSIS — J9601 Acute respiratory failure with hypoxia: Secondary | ICD-10-CM | POA: Diagnosis present

## 2023-03-06 DIAGNOSIS — I255 Ischemic cardiomyopathy: Secondary | ICD-10-CM | POA: Diagnosis not present

## 2023-03-06 DIAGNOSIS — N390 Urinary tract infection, site not specified: Secondary | ICD-10-CM | POA: Diagnosis not present

## 2023-03-06 DIAGNOSIS — I110 Hypertensive heart disease with heart failure: Secondary | ICD-10-CM | POA: Diagnosis not present

## 2023-03-06 DIAGNOSIS — J9811 Atelectasis: Secondary | ICD-10-CM | POA: Diagnosis not present

## 2023-03-06 DIAGNOSIS — I5043 Acute on chronic combined systolic (congestive) and diastolic (congestive) heart failure: Secondary | ICD-10-CM | POA: Diagnosis not present

## 2023-03-06 DIAGNOSIS — E66811 Obesity, class 1: Secondary | ICD-10-CM | POA: Diagnosis present

## 2023-03-06 DIAGNOSIS — I252 Old myocardial infarction: Secondary | ICD-10-CM

## 2023-03-06 DIAGNOSIS — Z981 Arthrodesis status: Secondary | ICD-10-CM | POA: Diagnosis not present

## 2023-03-06 DIAGNOSIS — Z7902 Long term (current) use of antithrombotics/antiplatelets: Secondary | ICD-10-CM

## 2023-03-06 DIAGNOSIS — J810 Acute pulmonary edema: Secondary | ICD-10-CM | POA: Diagnosis not present

## 2023-03-06 DIAGNOSIS — R918 Other nonspecific abnormal finding of lung field: Secondary | ICD-10-CM | POA: Diagnosis not present

## 2023-03-06 DIAGNOSIS — I5021 Acute systolic (congestive) heart failure: Secondary | ICD-10-CM | POA: Diagnosis present

## 2023-03-06 DIAGNOSIS — Z7985 Long-term (current) use of injectable non-insulin antidiabetic drugs: Secondary | ICD-10-CM

## 2023-03-06 DIAGNOSIS — R109 Unspecified abdominal pain: Secondary | ICD-10-CM | POA: Diagnosis not present

## 2023-03-06 DIAGNOSIS — E1165 Type 2 diabetes mellitus with hyperglycemia: Secondary | ICD-10-CM | POA: Diagnosis not present

## 2023-03-06 DIAGNOSIS — Z6833 Body mass index (BMI) 33.0-33.9, adult: Secondary | ICD-10-CM

## 2023-03-06 DIAGNOSIS — Z888 Allergy status to other drugs, medicaments and biological substances status: Secondary | ICD-10-CM

## 2023-03-06 DIAGNOSIS — I2584 Coronary atherosclerosis due to calcified coronary lesion: Secondary | ICD-10-CM | POA: Diagnosis present

## 2023-03-06 DIAGNOSIS — Z87442 Personal history of urinary calculi: Secondary | ICD-10-CM

## 2023-03-06 DIAGNOSIS — I447 Left bundle-branch block, unspecified: Secondary | ICD-10-CM | POA: Diagnosis present

## 2023-03-06 DIAGNOSIS — Z87440 Personal history of urinary (tract) infections: Secondary | ICD-10-CM

## 2023-03-06 DIAGNOSIS — E669 Obesity, unspecified: Secondary | ICD-10-CM | POA: Diagnosis present

## 2023-03-06 HISTORY — PX: RIGHT/LEFT HEART CATH AND CORONARY/GRAFT ANGIOGRAPHY: CATH118267

## 2023-03-06 LAB — POCT I-STAT EG7
Acid-base deficit: 1 mmol/L (ref 0.0–2.0)
Acid-base deficit: 2 mmol/L (ref 0.0–2.0)
Bicarbonate: 23.7 mmol/L (ref 20.0–28.0)
Bicarbonate: 24.2 mmol/L (ref 20.0–28.0)
Calcium, Ion: 1.19 mmol/L (ref 1.15–1.40)
Calcium, Ion: 1.21 mmol/L (ref 1.15–1.40)
HCT: 30 % — ABNORMAL LOW (ref 36.0–46.0)
HCT: 31 % — ABNORMAL LOW (ref 36.0–46.0)
Hemoglobin: 10.2 g/dL — ABNORMAL LOW (ref 12.0–15.0)
Hemoglobin: 10.5 g/dL — ABNORMAL LOW (ref 12.0–15.0)
O2 Saturation: 45 %
O2 Saturation: 46 %
Potassium: 3.8 mmol/L (ref 3.5–5.1)
Potassium: 3.9 mmol/L (ref 3.5–5.1)
Sodium: 137 mmol/L (ref 135–145)
Sodium: 138 mmol/L (ref 135–145)
TCO2: 25 mmol/L (ref 22–32)
TCO2: 26 mmol/L (ref 22–32)
pCO2, Ven: 43.7 mm[Hg] — ABNORMAL LOW (ref 44–60)
pCO2, Ven: 43.7 mm[Hg] — ABNORMAL LOW (ref 44–60)
pH, Ven: 7.343 (ref 7.25–7.43)
pH, Ven: 7.352 (ref 7.25–7.43)
pO2, Ven: 26 mm[Hg] — CL (ref 32–45)
pO2, Ven: 27 mm[Hg] — CL (ref 32–45)

## 2023-03-06 LAB — POCT I-STAT 7, (LYTES, BLD GAS, ICA,H+H)
Acid-base deficit: 3 mmol/L — ABNORMAL HIGH (ref 0.0–2.0)
Bicarbonate: 21.7 mmol/L (ref 20.0–28.0)
Calcium, Ion: 1.12 mmol/L — ABNORMAL LOW (ref 1.15–1.40)
HCT: 29 % — ABNORMAL LOW (ref 36.0–46.0)
Hemoglobin: 9.9 g/dL — ABNORMAL LOW (ref 12.0–15.0)
O2 Saturation: 86 %
Potassium: 3.7 mmol/L (ref 3.5–5.1)
Sodium: 140 mmol/L (ref 135–145)
TCO2: 23 mmol/L (ref 22–32)
pCO2 arterial: 37.5 mm[Hg] (ref 32–48)
pH, Arterial: 7.369 (ref 7.35–7.45)
pO2, Arterial: 53 mm[Hg] — ABNORMAL LOW (ref 83–108)

## 2023-03-06 LAB — ECHOCARDIOGRAM COMPLETE
Area-P 1/2: 5.38 cm2
Calc EF: 31.5 %
Height: 63 in
MV M vel: 5 m/s
MV Peak grad: 100 mm[Hg]
Radius: 0.4 cm
S' Lateral: 4.6 cm
Single Plane A2C EF: 26 %
Single Plane A4C EF: 34.4 %
Weight: 3024 [oz_av]

## 2023-03-06 LAB — BASIC METABOLIC PANEL
Anion gap: 12 (ref 5–15)
BUN: 29 mg/dL — ABNORMAL HIGH (ref 8–23)
CO2: 21 mmol/L — ABNORMAL LOW (ref 22–32)
Calcium: 8.5 mg/dL — ABNORMAL LOW (ref 8.9–10.3)
Chloride: 100 mmol/L (ref 98–111)
Creatinine, Ser: 0.96 mg/dL (ref 0.44–1.00)
GFR, Estimated: >60 mL/min (ref >60)
Glucose, Bld: 212 mg/dL — ABNORMAL HIGH (ref 70–99)
Potassium: 4.3 mmol/L (ref 3.5–5.1)
Sodium: 133 mmol/L — ABNORMAL LOW (ref 135–145)

## 2023-03-06 LAB — CBC
HCT: 32.1 % — ABNORMAL LOW (ref 36.0–46.0)
Hemoglobin: 10.5 g/dL — ABNORMAL LOW (ref 12.0–15.0)
MCH: 29.9 pg (ref 26.0–34.0)
MCHC: 32.7 g/dL (ref 30.0–36.0)
MCV: 91.5 fL (ref 80.0–100.0)
Platelets: 187 10*3/uL (ref 150–400)
RBC: 3.51 MIL/uL — ABNORMAL LOW (ref 3.87–5.11)
RDW: 13.2 % (ref 11.5–15.5)
WBC: 11 10*3/uL — ABNORMAL HIGH (ref 4.0–10.5)
nRBC: 0 % (ref 0.0–0.2)

## 2023-03-06 LAB — HIV ANTIBODY (ROUTINE TESTING W REFLEX): HIV Screen 4th Generation wRfx: NONREACTIVE

## 2023-03-06 LAB — MRSA NEXT GEN BY PCR, NASAL: MRSA by PCR Next Gen: NOT DETECTED

## 2023-03-06 LAB — TROPONIN I (HIGH SENSITIVITY)
Troponin I (High Sensitivity): 8553 ng/L (ref <18)
Troponin I (High Sensitivity): 10249 ng/L (ref <18)

## 2023-03-06 LAB — CBG MONITORING, ED: Glucose-Capillary: 178 mg/dL — ABNORMAL HIGH (ref 70–99)

## 2023-03-06 LAB — HEMOGLOBIN A1C
Hgb A1c MFr Bld: 8 % — ABNORMAL HIGH (ref 4.8–5.6)
Mean Plasma Glucose: 182.9 mg/dL

## 2023-03-06 LAB — BRAIN NATRIURETIC PEPTIDE: B Natriuretic Peptide: 2236 pg/mL — ABNORMAL HIGH (ref 0.0–100.0)

## 2023-03-06 LAB — CG4 I-STAT (LACTIC ACID): Lactic Acid, Venous: 0.7 mmol/L (ref 0.5–1.9)

## 2023-03-06 LAB — D-DIMER, QUANTITATIVE: D-Dimer, Quant: 0.77 ug{FEU}/mL — ABNORMAL HIGH (ref 0.00–0.50)

## 2023-03-06 LAB — GLUCOSE, CAPILLARY
Glucose-Capillary: 166 mg/dL — ABNORMAL HIGH (ref 70–99)
Glucose-Capillary: 197 mg/dL — ABNORMAL HIGH (ref 70–99)

## 2023-03-06 LAB — HEPARIN LEVEL (UNFRACTIONATED): Heparin Unfractionated: 0.27 [IU]/mL — ABNORMAL LOW (ref 0.30–0.70)

## 2023-03-06 SURGERY — RIGHT/LEFT HEART CATH AND CORONARY/GRAFT ANGIOGRAPHY
Anesthesia: LOCAL

## 2023-03-06 MED ORDER — FUROSEMIDE 10 MG/ML IJ SOLN
INTRAMUSCULAR | Status: AC
  Filled 2023-03-06: qty 4

## 2023-03-06 MED ORDER — FUROSEMIDE 10 MG/ML IJ SOLN
INTRAMUSCULAR | Status: DC | PRN
  Administered 2023-03-06: 80 mg via INTRAVENOUS

## 2023-03-06 MED ORDER — INSULIN ASPART 100 UNIT/ML IJ SOLN
0.0000 [IU] | Freq: Three times a day (TID) | INTRAMUSCULAR | Status: DC
  Administered 2023-03-06 – 2023-03-07 (×3): 3 [IU] via SUBCUTANEOUS
  Administered 2023-03-07 (×2): 8 [IU] via SUBCUTANEOUS
  Administered 2023-03-07: 5 [IU] via SUBCUTANEOUS
  Administered 2023-03-08: 2 [IU] via SUBCUTANEOUS
  Administered 2023-03-08: 8 [IU] via SUBCUTANEOUS
  Administered 2023-03-09 (×2): 3 [IU] via SUBCUTANEOUS
  Administered 2023-03-09: 11 [IU] via SUBCUTANEOUS
  Administered 2023-03-10 – 2023-03-12 (×7): 5 [IU] via SUBCUTANEOUS
  Administered 2023-03-12: 3 [IU] via SUBCUTANEOUS
  Administered 2023-03-12: 5 [IU] via SUBCUTANEOUS
  Administered 2023-03-13: 3 [IU] via SUBCUTANEOUS
  Administered 2023-03-13: 8 [IU] via SUBCUTANEOUS

## 2023-03-06 MED ORDER — VERAPAMIL HCL 2.5 MG/ML IV SOLN
INTRAVENOUS | Status: AC
  Filled 2023-03-06: qty 2

## 2023-03-06 MED ORDER — SODIUM CHLORIDE 0.9 % IV SOLN: 250.0000 mL | INTRAVENOUS | Status: DC | PRN

## 2023-03-06 MED ORDER — PERFLUTREN LIPID MICROSPHERE
1.0000 mL | INTRAVENOUS | Status: AC | PRN
  Administered 2023-03-06: 5 mL via INTRAVENOUS

## 2023-03-06 MED ORDER — MIDAZOLAM HCL 2 MG/2ML IJ SOLN
INTRAMUSCULAR | Status: AC
  Filled 2023-03-06: qty 2

## 2023-03-06 MED ORDER — LIDOCAINE HCL (PF) 1 % IJ SOLN
INTRAMUSCULAR | Status: AC
  Filled 2023-03-06: qty 30

## 2023-03-06 MED ORDER — ASPIRIN 81 MG PO CHEW
324.0000 mg | CHEWABLE_TABLET | ORAL | Status: AC
  Administered 2023-03-06: 324 mg via ORAL
  Filled 2023-03-06: qty 4

## 2023-03-06 MED ORDER — HEPARIN SODIUM (PORCINE) 5000 UNIT/ML IJ SOLN
5000.0000 [IU] | Freq: Three times a day (TID) | INTRAMUSCULAR | Status: DC
  Administered 2023-03-07 – 2023-03-13 (×19): 5000 [IU] via SUBCUTANEOUS
  Filled 2023-03-06 (×19): qty 1

## 2023-03-06 MED ORDER — FENTANYL CITRATE PF 50 MCG/ML IJ SOSY
25.0000 ug | PREFILLED_SYRINGE | Freq: Once | INTRAMUSCULAR | Status: AC
  Administered 2023-03-06: 25 ug via INTRAVENOUS
  Filled 2023-03-06: qty 1

## 2023-03-06 MED ORDER — LIDOCAINE HCL (PF) 1 % IJ SOLN
INTRAMUSCULAR | Status: DC | PRN
  Administered 2023-03-06 (×2): 5 mL via INTRADERMAL

## 2023-03-06 MED ORDER — NITROGLYCERIN IN D5W 200-5 MCG/ML-% IV SOLN
INTRAVENOUS | Status: AC
  Filled 2023-03-06: qty 250

## 2023-03-06 MED ORDER — IOHEXOL 350 MG/ML SOLN
INTRAVENOUS | Status: DC | PRN
  Administered 2023-03-06: 56 mL

## 2023-03-06 MED ORDER — HEPARIN (PORCINE) IN NACL 1000-0.9 UT/500ML-% IV SOLN
INTRAVENOUS | Status: DC | PRN
  Administered 2023-03-06 (×2): 500 mL

## 2023-03-06 MED ORDER — FLUOXETINE HCL 20 MG PO CAPS
80.0000 mg | ORAL_CAPSULE | Freq: Two times a day (BID) | ORAL | Status: DC
  Administered 2023-03-06 – 2023-03-13 (×14): 80 mg via ORAL
  Filled 2023-03-06 (×14): qty 4

## 2023-03-06 MED ORDER — ACETAMINOPHEN 325 MG PO TABS
650.0000 mg | ORAL_TABLET | ORAL | Status: DC | PRN
  Administered 2023-03-06 – 2023-03-08 (×6): 650 mg via ORAL
  Filled 2023-03-06 (×6): qty 2

## 2023-03-06 MED ORDER — GABAPENTIN 300 MG PO CAPS
600.0000 mg | ORAL_CAPSULE | Freq: Three times a day (TID) | ORAL | Status: DC
  Administered 2023-03-06 – 2023-03-08 (×6): 600 mg via ORAL
  Filled 2023-03-06 (×6): qty 2

## 2023-03-06 MED ORDER — POTASSIUM CHLORIDE CRYS ER 20 MEQ PO TBCR
40.0000 meq | EXTENDED_RELEASE_TABLET | Freq: Once | ORAL | Status: AC
  Administered 2023-03-06: 40 meq via ORAL
  Filled 2023-03-06: qty 2

## 2023-03-06 MED ORDER — HEPARIN BOLUS VIA INFUSION
4000.0000 [IU] | Freq: Once | INTRAVENOUS | Status: AC
  Administered 2023-03-06: 4000 [IU] via INTRAVENOUS

## 2023-03-06 MED ORDER — ATORVASTATIN CALCIUM 80 MG PO TABS
80.0000 mg | ORAL_TABLET | Freq: Every day | ORAL | Status: DC
  Administered 2023-03-06 – 2023-03-12 (×7): 80 mg via ORAL
  Filled 2023-03-06 (×7): qty 1

## 2023-03-06 MED ORDER — MIDAZOLAM HCL 2 MG/2ML IJ SOLN
INTRAMUSCULAR | Status: DC | PRN
  Administered 2023-03-06: 1 mg via INTRAVENOUS

## 2023-03-06 MED ORDER — FENTANYL CITRATE PF 50 MCG/ML IJ SOSY
50.0000 ug | PREFILLED_SYRINGE | Freq: Once | INTRAMUSCULAR | Status: AC
  Administered 2023-03-06: 50 ug via INTRAVENOUS
  Filled 2023-03-06: qty 1

## 2023-03-06 MED ORDER — NITROGLYCERIN 0.2 MG/HR TD PT24
0.2000 mg | MEDICATED_PATCH | Freq: Every day | TRANSDERMAL | Status: DC
  Administered 2023-03-06: 0.2 mg via TRANSDERMAL
  Filled 2023-03-06 (×3): qty 1

## 2023-03-06 MED ORDER — SODIUM CHLORIDE 0.9% FLUSH: 10.0000 mL | Freq: Two times a day (BID) | INTRAVENOUS | Status: DC

## 2023-03-06 MED ORDER — FENTANYL 50 MCG/HR TD PT72
1.0000 | MEDICATED_PATCH | TRANSDERMAL | Status: DC
  Administered 2023-03-06: 1 via TRANSDERMAL
  Filled 2023-03-06: qty 1

## 2023-03-06 MED ORDER — MORPHINE SULFATE (PF) 2 MG/ML IV SOLN
2.0000 mg | INTRAVENOUS | Status: DC | PRN
  Administered 2023-03-06 – 2023-03-13 (×23): 2 mg via INTRAVENOUS
  Filled 2023-03-06 (×23): qty 1

## 2023-03-06 MED ORDER — SODIUM CHLORIDE 0.9% FLUSH
3.0000 mL | Freq: Two times a day (BID) | INTRAVENOUS | Status: DC
  Administered 2023-03-06: 3 mL via INTRAVENOUS
  Administered 2023-03-07: 10 mL via INTRAVENOUS
  Administered 2023-03-07 – 2023-03-09 (×4): 3 mL via INTRAVENOUS

## 2023-03-06 MED ORDER — TRAZODONE HCL 50 MG PO TABS
50.0000 mg | ORAL_TABLET | Freq: Every day | ORAL | Status: DC
  Administered 2023-03-06 – 2023-03-12 (×7): 50 mg via ORAL
  Filled 2023-03-06 (×7): qty 1

## 2023-03-06 MED ORDER — HEPARIN (PORCINE) 25000 UT/250ML-% IV SOLN
900.0000 [IU]/h | INTRAVENOUS | Status: DC
  Administered 2023-03-06: 900 [IU]/h via INTRAVENOUS
  Filled 2023-03-06: qty 250

## 2023-03-06 MED ORDER — NITROGLYCERIN IN D5W 200-5 MCG/ML-% IV SOLN
5.0000 ug/min | INTRAVENOUS | Status: DC
  Administered 2023-03-06: 5 ug/min via INTRAVENOUS
  Administered 2023-03-06: 10 ug/min via INTRAVENOUS
  Administered 2023-03-07: 35 ug/min via INTRAVENOUS
  Filled 2023-03-06: qty 250

## 2023-03-06 MED ORDER — HEPARIN SODIUM (PORCINE) 1000 UNIT/ML IJ SOLN
INTRAMUSCULAR | Status: AC
  Filled 2023-03-06: qty 10

## 2023-03-06 MED ORDER — NITROGLYCERIN 0.4 MG SL SUBL: 0.4000 mg | SUBLINGUAL_TABLET | SUBLINGUAL | Status: DC | PRN

## 2023-03-06 MED ORDER — HEPARIN SODIUM (PORCINE) 1000 UNIT/ML IJ SOLN
INTRAMUSCULAR | Status: DC | PRN
  Administered 2023-03-06: 4000 [IU] via INTRAVENOUS

## 2023-03-06 MED ORDER — VERAPAMIL HCL 2.5 MG/ML IV SOLN
INTRAVENOUS | Status: DC | PRN
  Administered 2023-03-06: 10 mL via INTRA_ARTERIAL

## 2023-03-06 MED ORDER — FUROSEMIDE 10 MG/ML IJ SOLN
80.0000 mg | Freq: Three times a day (TID) | INTRAMUSCULAR | Status: DC
  Administered 2023-03-06 – 2023-03-07 (×2): 80 mg via INTRAVENOUS
  Filled 2023-03-06 (×2): qty 8

## 2023-03-06 MED ORDER — CHLORHEXIDINE GLUCONATE CLOTH 2 % EX PADS
6.0000 | MEDICATED_PAD | Freq: Every day | CUTANEOUS | Status: DC
  Administered 2023-03-06 – 2023-03-13 (×8): 6 via TOPICAL

## 2023-03-06 MED ORDER — FUROSEMIDE 10 MG/ML IJ SOLN
20.0000 mg | INTRAMUSCULAR | Status: AC
  Administered 2023-03-06: 20 mg via INTRAVENOUS
  Filled 2023-03-06: qty 2

## 2023-03-06 MED ORDER — ASPIRIN 81 MG PO TBEC
81.0000 mg | DELAYED_RELEASE_TABLET | Freq: Every day | ORAL | Status: DC
  Administered 2023-03-07 – 2023-03-13 (×7): 81 mg via ORAL
  Filled 2023-03-06 (×7): qty 1

## 2023-03-06 MED ORDER — ONDANSETRON HCL 4 MG/2ML IJ SOLN
4.0000 mg | Freq: Four times a day (QID) | INTRAMUSCULAR | Status: DC | PRN
  Administered 2023-03-13: 4 mg via INTRAVENOUS
  Filled 2023-03-06: qty 2

## 2023-03-06 MED ORDER — SODIUM CHLORIDE 0.9% FLUSH: 3.0000 mL | INTRAVENOUS | Status: DC | PRN

## 2023-03-06 SURGICAL SUPPLY — 13 items
CATH BALLN WEDGE 5F 110CM (CATHETERS) IMPLANT
CATH INFINITI 5 FR IM (CATHETERS) IMPLANT
CATH INFINITI 5FR MULTPACK ANG (CATHETERS) IMPLANT
DEVICE RAD COMP TR BAND LRG (VASCULAR PRODUCTS) IMPLANT
GLIDESHEATH SLEND SS 6F .021 (SHEATH) IMPLANT
GUIDEWIRE INQWIRE 1.5J.035X260 (WIRE) IMPLANT
INQWIRE 1.5J .035X260CM (WIRE) ×1
KIT SYRINGE INJ CVI SPIKEX1 (MISCELLANEOUS) IMPLANT
PACK CARDIAC CATHETERIZATION (CUSTOM PROCEDURE TRAY) ×2 IMPLANT
SET ATX-X65L (MISCELLANEOUS) IMPLANT
SHEATH GLIDE SLENDER 4/5FR (SHEATH) IMPLANT
SHEATH PROBE COVER 6X72 (BAG) IMPLANT
WIRE MICROINTRODUCER 60CM (WIRE) IMPLANT

## 2023-03-06 NOTE — Plan of Care (Signed)
°  Problem: Clinical Measurements: °Goal: Ability to maintain clinical measurements within normal limits will improve °Outcome: Progressing °Goal: Will remain free from infection °Outcome: Progressing °Goal: Diagnostic test results will improve °Outcome: Progressing °Goal: Respiratory complications will improve °Outcome: Progressing °  °

## 2023-03-06 NOTE — ED Triage Notes (Signed)
Pt complains of chest pain and shortness of breath. Pt states SOB started yesterday. Pt states she has taken 5 nitroglycerin tabs.

## 2023-03-06 NOTE — ED Notes (Signed)
Pt concern blood sugar may be dropping. CBG 178. [facility] on the way to [facility].

## 2023-03-06 NOTE — Progress Notes (Signed)
PHARMACY - ANTICOAGULATION CONSULT NOTE  Pharmacy Consult for heparin dosing Indication: chest pain/ACS  Allergies  Allergen Reactions   Jardiance [Empagliflozin] Other (See Comments)    Recurrent UTI   Losartan Other (See Comments)    Hyperkalemia    Patient Measurements: Height: 5\' 3"  (160 cm) Weight: 85.7 kg (189 lb) IBW/kg (Calculated) : 52.4 Heparin Dosing Weight: 71.6 kg  Vital Signs: Temp: 97.6 F (36.4 C) (11/20 0800) Temp Source: Oral (11/20 0800) BP: 107/67 (11/20 0817) Pulse Rate: 104 (11/20 0800)  Labs: Recent Labs    03/06/23 0814  HGB 10.5*  HCT 32.1*  PLT 187  CREATININE 0.96    Estimated Creatinine Clearance: 60.6 mL/min (by C-G formula based on SCr of 0.96 mg/dL).   Medical History: Past Medical History:  Diagnosis Date   Acute anterior wall MI (HCC) 10/04/2015   Acute MI anterior wall first episode care [facility]) 10/04/2015   Coronary angiogram 10/04/2015: Occluded proximal LAD, superior takeoff of ramus intermediate, large circumflex, moderate to large RCA with mild diffuse disease. S/P thrombectomy, overlapping stents to the proximal LAD with 3.5 x 18, 3.5 x 12 mm Xience DES, 100% to 0%.    CAD (coronary artery disease), native coronary artery 10/11/2015   Coronary angiogram 10/04/2015: Left main mild calcification. LAD tortuous origin, occluded in the proximal segment. Diagonal 2 is moderate-sized, has proximal 50% stenosis. S/P thrombectomy & DES overlapping 3.5 x 18 and 3.5 x 12 mm Xience Alpine stents,   Ramus intermediate has a tortuous origin. Circumflex mild disease. Right coronary artery mild diffuse disease   Colitis    DM (diabetes mellitus), type 2, uncontrolled with complications 06/12/2018   H/O tobacco use, presenting hazards to health 06/12/2018   Quit in 2017 after MI   Hypercholesteremia 06/12/2018    Medications:  (Not in a hospital admission)   Assessment: 27 YOF presenting w/ chest pain and SOB despite taking 5 nitroglycerin  tablets. She is not on any other anticoagulation. Pharmacy consulted to start heparin for ACS stenting.  Goal of Therapy:  Heparin level 0.3-0.7 units/ml Monitor platelets by anticoagulation protocol: Yes   Plan:  Give 4000u units bolus x 1 Start heparin infusion at 900 units/hr Check anti-Xa level in 6 hours and daily while on heparin Continue to monitor H&H and platelets  Adrian Prows, PharmD Student 03/06/2023,9:08 AM

## 2023-03-06 NOTE — ED Provider Notes (Signed)
[place] EMERGENCY DEPARTMENT AT [facility] Provider Note   CSN: [id number] Arrival date & time: 03/06/23  9528     History {Add pertinent medical, surgical, social history, OB history to HPI:1} Chief Complaint  Patient presents with   Chest Pain   Shortness of Breath    Sonia Morales is a 67 y.o. female.  67 year old female with a history of CAD status post PCI and CABG on aspirin, diabetes, ischemic cardiomyopathy (EF 40 to 45%) who presents emergency department with chest discomfort and shortness of breath.  Yesterday reported not feeling well.  Started having a burning sensation substernally that radiated down to her right arm.  Nausea but no vomiting.  Did have diaphoresis.  Tried taking multiple nitroglycerin without significant improvement of her symptoms.  Has not yet taken her aspirin today.  Started having some shortness of breath as well today and decided to come into the emergency department for evaluation.  Not currently having the chest burning.  Not on blood thinners.  No history of DVT or PE.  Daughter is concerned because this was similar to her prior presentation for heart attack.       Home Medications Prior to Admission medications   Medication Sig Start Date End Date Taking? Authorizing Provider  Accu-Chek Softclix Lancets lancets Use as instructed to monitor glucose 4 times daily 09/15/20   Dani Gobble, NP  aspirin EC 81 MG EC tablet Take 1 tablet (81 mg total) by mouth daily. Swallow whole. 01/12/21   Barrett, Erin R, PA-C  atorvastatin (LIPITOR) 80 MG tablet Take 1 tablet (80 mg total) by mouth daily at 6 PM. Patient taking differently: Take 80 mg by mouth at bedtime. 10/06/15   Yates Decamp, MD  diclofenac Sodium (VOLTAREN) 1 % GEL Apply 1 application  topically daily as needed (pain).    [provider]  FLUoxetine (PROZAC) 40 MG capsule Take 2 capsules (80 mg total) by mouth 2 (two) times daily. 05/10/21   Cantwell, Celeste C, PA-C   gabapentin (NEURONTIN) 600 MG tablet Take 600 mg by mouth 3 (three) times daily. 11/14/22   [provider]  glucose blood (ACCU-CHEK GUIDE) test strip Use as instructed to monitor glucose 4 times daily 09/15/20   Dani Gobble, NP  insulin aspart protamine- aspart (NOVOLOG MIX 70/30) (70-30) 100 UNIT/ML injection Inject 40 Units into the skin 2 (two) times daily.    [provider]  insulin NPH-regular Human (70-30) 100 UNIT/ML injection Inject 42 Units into the skin 2 (two) times daily with a meal.    [provider]  nitroGLYCERIN (NITROSTAT) 0.4 MG SL tablet Place 1 tablet (0.4 mg total) under the tongue every 5 (five) minutes as needed for chest pain. 12/03/22   Yates Decamp, MD  Oxycodone HCl 10 MG TABS Take 10 mg by mouth 5 (five) times daily as needed (pain).    [provider]  Semaglutide, 1 MG/DOSE, (OZEMPIC, 1 MG/DOSE,) 4 MG/3ML SOPN Inject 1 mg into the skin once a week.    [provider]  traZODone (DESYREL) 50 MG tablet Take 50 mg by mouth at bedtime.  07/19/18   [provider]      Allergies    Jardiance [empagliflozin] and Losartan    Review of Systems   Review of Systems  Physical Exam Updated Vital Signs BP 107/67 (BP Location: Right Arm)   Pulse (!) 104   Temp 97.6 F (36.4 C) (Oral)   Resp Marland Kitchen)  22   Ht 5\' 3"  (1.6 m)   Wt 85.7 kg   SpO2 (!) 82%   BMI 33.48 kg/m  Physical Exam Vitals and nursing note reviewed.  Constitutional:      General: She is not in acute distress.    Appearance: She is well-developed.     Comments: On 10 L nasal cannula  HENT:     Head: Normocephalic and atraumatic.     Right Ear: External ear normal.     Left Ear: External ear normal.     Nose: Nose normal.  Eyes:     Extraocular Movements: Extraocular movements intact.     Conjunctiva/sclera: Conjunctivae normal.     Pupils: Pupils are equal, round, and reactive to light.  Cardiovascular:     Rate and Rhythm: Regular rhythm.  Tachycardia present.     Heart sounds: No murmur heard. Pulmonary:     Effort: Pulmonary effort is normal. No respiratory distress.     Comments: Diminished bilaterally.  Bibasilar Rales that are faint Musculoskeletal:     Cervical back: Normal range of motion and neck supple.     Right lower leg: No edema.     Left lower leg: No edema.  Skin:    General: Skin is warm and dry.  Neurological:     Mental Status: She is alert and oriented to person, place, and time. Mental status is at baseline.  Psychiatric:        Mood and Affect: Mood normal.     ED Results / Procedures / Treatments   Labs (all labs ordered are listed, but only abnormal results are displayed) Labs Reviewed  CBC - Abnormal; Notable for the following components:      Result Value   WBC 11.0 (*)    RBC 3.51 (*)    Hemoglobin 10.5 (*)    HCT 32.1 (*)    All other components within normal limits  BASIC METABOLIC PANEL  BRAIN NATRIURETIC PEPTIDE  D-DIMER, QUANTITATIVE  TROPONIN I (HIGH SENSITIVITY)    EKG EKG Interpretation Date/Time:  Wednesday March 06 2023 08:02:49 EST Ventricular Rate:  104 PR Interval:  152 QRS Duration:  120 QT Interval:  367 QTC Calculation: 483 R Axis:   -68  Text Interpretation: Sinus tachycardia Left anterior fascicular block LVH with secondary repolarization abnormality When compared to 01/06/21 no significant change was found Confirmed by Vonita Moss [phone]) on 03/06/2023 8:12:56 AM  Radiology No results found.  Procedures Procedures  {Document cardiac monitor, telemetry assessment procedure when appropriate:1}  Medications Ordered in ED Medications  aspirin chewable tablet 324 mg (has no administration in time range)    ED Course/ Medical Decision Making/ A&P   {   Click here for ABCD2, HEART and other calculatorsREFRESH Note before signing :1}                              Medical Decision Making Amount and/or Complexity of Data Reviewed Labs:  ordered. Radiology: ordered.  Risk OTC drugs.   ***  {Document critical care time when appropriate:1} {Document review of labs and clinical decision tools ie heart score, Chads2Vasc2 etc:1}  {Document your independent review of radiology images, and any outside records:1} {Document your discussion with family members, caretakers, and with consultants:1} {Document social determinants of health affecting pt's care:1} {Document your decision making why or why not admission, treatments were needed:1} Final Clinical Impression(s) / ED Diagnoses Final diagnoses:  None  Rx / DC Orders ED Discharge Orders     None

## 2023-03-06 NOTE — H&P (Signed)
CARDIOLOGY HISTORY AND PHYSICAL    Patient ID: Sonia Morales; [id number]; 24-Oct-1955   Admit date: 03/06/2023 Date of Consult: 03/06/2023  Primary Care Provider: Richardean Chimera, MD Primary Cardiologist:  Primary Electrophysiologist:     History of Present Illness:   Sonia Morales is a 67 year old F known to have CAD manifested by STEMI 2017 s/p ostial to proximal LAD PCI, NSTEMI c/w cardiogenic shock in 2022 s/p emergent CABG (LIMA to LAD, SVG to OM) due to severe LM disease, ischemic cardiomyopathy with LVEF 40 to 45% (improved from 35%) and no device, poorly controlled IDDM2 on Ozempic presented to ER with burning pain in the middle of her chest radiating to her jaw/neck and right shoulder since last night, had to take 3 SL NTG every 5 minutes followed by 5 SL NTG every 10 minutes to get through the night and arrived to the ER this a.m. DOE for the last 1 to 2 days and had orthopnea, PND last night. Hypoxic upon arrival, SpO2 82% and currently saturating well on 10L [place] oxygen. She continues to have 1-2/10 residual burning pain in her chest.  Received aspirin 325 mg load, started on heparin drip.  N.p.o. since yesterday morning.  Currently not on any systemic AC.  Past Medical History:  Diagnosis Date   Acute anterior wall MI (HCC) 10/04/2015   Acute MI anterior wall first episode care [facility]) 10/04/2015   Coronary angiogram 10/04/2015: Occluded proximal LAD, superior takeoff of ramus intermediate, large circumflex, moderate to large RCA with mild diffuse disease. S/P thrombectomy, overlapping stents to the proximal LAD with 3.5 x 18, 3.5 x 12 mm Xience DES, 100% to 0%.    CAD (coronary artery disease), native coronary artery 10/11/2015   Coronary angiogram 10/04/2015: Left main mild calcification. LAD tortuous origin, occluded in the proximal segment. Diagonal 2 is moderate-sized, has proximal 50% stenosis. S/P thrombectomy & DES overlapping 3.5 x 18 and 3.5 x 12 mm Xience Alpine stents,   Ramus  intermediate has a tortuous origin. Circumflex mild disease. Right coronary artery mild diffuse disease   Colitis    DM (diabetes mellitus), type 2, uncontrolled with complications 06/12/2018   H/O tobacco use, presenting hazards to health 06/12/2018   Quit in 2017 after MI   Hypercholesteremia 06/12/2018    Past Surgical History:  Procedure Laterality Date   CARDIAC CATHETERIZATION N/A 10/04/2015   Procedure: Left Heart Cath and Coronary Angiography;  Surgeon: Yates Decamp, MD;  Location: [facility] INVASIVE CV LAB;  Service: Cardiovascular;  Laterality: N/A;   CARDIAC CATHETERIZATION  10/04/2015   Procedure: Coronary Stent Intervention;  Surgeon: Yates Decamp, MD;  Location: [facility] INVASIVE CV LAB;  Service: Cardiovascular;;   CORONARY ARTERY BYPASS GRAFT N/A 01/06/2021   Procedure: CORONARY ARTERY BYPASS GRAFTING (CABG), ON PUMP, TIMES TWO USING LEFT INTERNAL MAMMARY ARTERY AND RIGHT ENDOSCOPICALLY HARVESTED GREATER SAPHENOUS VEIN;  Surgeon: Corliss Skains, MD;  Location: [facility] OR;  Service: Open Heart Surgery;  Laterality: N/A;   IABP INSERTION N/A 01/05/2021   Procedure: IABP INSERTION;  Surgeon: Yates Decamp, MD;  Location: [facility] INVASIVE CV LAB;  Service: Cardiovascular;  Laterality: N/A;   PERIPHERAL VASCULAR CATHETERIZATION  10/04/2015   Procedure: Thrombectomy;  Surgeon: Yates Decamp, MD;  Location: [facility] INVASIVE CV LAB;  Service: Cardiovascular;;   RIGHT/LEFT HEART CATH AND CORONARY ANGIOGRAPHY N/A 01/02/2021   Procedure: RIGHT/LEFT HEART CATH AND CORONARY ANGIOGRAPHY;  Surgeon: Elder Negus, MD;  Location: [facility] INVASIVE CV LAB;  Service: Cardiovascular;  Laterality:  N/A;   TEE WITHOUT CARDIOVERSION N/A 01/06/2021   Procedure: TRANSESOPHAGEAL ECHOCARDIOGRAM (TEE);  Surgeon: Corliss Skains, MD;  Location: [facility] OR;  Service: Open Heart Surgery;  Laterality: N/A;       Inpatient Medications: Scheduled Meds:  furosemide  20 mg Intravenous STAT   nitroGLYCERIN  0.2 mg Transdermal Daily   Continuous  Infusions:  heparin 900 Units/hr (03/06/23 0927)   PRN Meds:   Allergies:    Allergies  Allergen Reactions   Jardiance [Empagliflozin] Other (See Comments)    Recurrent UTI   Losartan Other (See Comments)    Hyperkalemia    Social History:   Social History   Socioeconomic History   Marital status: Married    Spouse name: Leslieann Gaydosh   Number of children: 2   Years of education: Not on file   Highest education level: High school graduate  Occupational History   Occupation: disability  Tobacco Use   Smoking status: Former    Current packs/day: 0.00    Average packs/day: 1 pack/day for 30.0 years (30.0 ttl pk-yrs)    Types: Cigarettes    Start date: 27    Quit date: 2017    Years since quitting: 7.8   Smokeless tobacco: Never  Vaping Use   Vaping status: Former   Start date: 11/15/2019   Quit date: 11/28/2020   Substances: Nicotine  Substance and Sexual Activity   Alcohol use: Never   Drug use: Never   Sexual activity: Not on file  Other Topics Concern   Not on file  Social History Narrative   Not on file   Social Determinants of Health   Financial Resource Strain: Low Risk  (01/04/2021)   Overall Financial Resource Strain (CARDIA)    Difficulty of Paying Living Expenses: Not very hard  Food Insecurity: No Food Insecurity (01/04/2021)   Hunger Vital Sign    Worried About Running Out of Food in the Last Year: Never true    Ran Out of Food in the Last Year: Never true  Transportation Needs: No Transportation Needs (01/04/2021)   PRAPARE - Administrator, Civil Service (Medical): No    Lack of Transportation (Non-Medical): No  Physical Activity: Not on file  Stress: Not on file  Social Connections: Not on file  Intimate Partner Violence: Not on file    Family History:    Family History  Problem Relation Age of Onset   Heart attack Mother    Cancer Brother      ROS:  Please see the history of present illness.  ROS  All other ROS reviewed  and negative.     Physical Exam/Data:   Vitals:   03/06/23 0800 03/06/23 0805 03/06/23 0817 03/06/23 0824  BP: 107/67  107/67   Pulse: (!) 104     Resp: (!) 22     Temp: 97.6 F (36.4 C)     TempSrc: Oral     SpO2:    (!) 82%  Weight:  85.7 kg    Height:  5\' 3"  (1.6 m)     No intake or output data in the 24 hours ending 03/06/23 1007 Filed Weights   03/06/23 0805  Weight: 85.7 kg   Body mass index is 33.48 kg/m.  General:  Well nourished, well developed, in no acute distress HEENT: normal Lymph: no adenopathy Neck: no JVD Endocrine:  No thryomegaly Vascular: No carotid bruits; FA pulses 2+ bilaterally without bruits  Cardiac:  normal S1, S2; RRR;  no murmur  Lungs: Bilateral basal Rales present Abd: soft, nontender, no hepatomegaly  Ext: no edema Musculoskeletal:  No deformities, BUE and BLE strength normal and equal Skin: warm and dry  Neuro:  CNs 2-12 intact, no focal abnormalities noted Psych:  Normal affect   EKG:  The EKG was personally reviewed and demonstrates: NSR, LVH with repolarization abnormality, no new ischemia. Telemetry:  Telemetry was personally reviewed and demonstrates:    Laboratory Data:  Chemistry Recent Labs  Lab 03/06/23 0814  NA 133*  K 4.3  CL 100  CO2 21*  GLUCOSE 212*  BUN 29*  CREATININE 0.96  CALCIUM 8.5*  GFRNONAA >60  ANIONGAP 12    No results for input(s): "PROT", "ALBUMIN", "AST", "ALT", "ALKPHOS", "BILITOT" in the last 168 hours. Hematology Recent Labs  Lab 03/06/23 0814  WBC 11.0*  RBC 3.51*  HGB 10.5*  HCT 32.1*  MCV 91.5  MCH 29.9  MCHC 32.7  RDW 13.2  PLT 187   Cardiac EnzymesNo results for input(s): "TROPONINI" in the last 168 hours. No results for input(s): "TROPIPOC" in the last 168 hours.  BNP Recent Labs  Lab 03/06/23 0814  BNP 2,236.0*    DDimer  Recent Labs  Lab 03/06/23 0814  DDIMER 0.77*    Radiology/Studies:  DG Chest Portable 1 View  Result Date: 03/06/2023 CLINICAL DATA:   67 year old female with chest pain, shortness of breath since yesterday. EXAM: PORTABLE CHEST 1 VIEW COMPARISON:  Chest radiographs 02/23/2021. FINDINGS: Portable AP semi upright view at 0818 hours. Chronic CABG. Mild cardiomegaly. Other mediastinal contours are within normal limits. Coarse increased bilateral pulmonary interstitial opacity, slightly asymmetric and greater on the left. No pneumothorax, consolidation. No pleural effusion identified. Previous anterior and posterior cervical spine fusion partially visible. No acute osseous abnormality identified. Paucity of bowel gas the visible abdomen. IMPRESSION: Chronic CABG, mild cardiomegaly with diffuse increased pulmonary interstitial opacity favored to be acute pulmonary edema. Viral/atypical respiratory infection felt less likely. Electronically Signed   By: Odessa Fleming M.D.   On: 03/06/2023 09:32    Assessment and Plan:   NSTEMI c/w ADHF Acute hypoxic respiratory failure secondary to ADHF Acute on chronic systolic heart failure Poorly controlled IDDM2 (Previously HbA1c 12 that improved to 7 after being on Ozempic) Ischemic cardiomyopathy LVEF 40 to 45% (improved from 35%)    -Presented with burning substernal chest pain radiating to her neck/jaw and right shoulder since last night associated with DOE x 1 to 2 days, orthopnea and PND since last night. Continues to have 1-2/10 residual lingering chest discomfort. Similar to index pain. EKG showed NSR, LVH with repull abnormality and no new ischemia. Hs troponins 8,553 and BNP 2,236. Currently on 10L [place] oxygen, has bibasilar Rales. Loaded with aspirin 325 mg, start heparin drip for NSTEMI management. She has soft blood pressures, will start Nitropatch and administer IV Lasix 20 mg x 1 dose and reevaluate her blood pressures.  Low threshold to start Levophed drip due to prior history of NSTEMI c/w cardiogenic shock in 2022.  She will benefit from transfer to higher level of care, [facility] for LHC and RHC  today.  N.p.o. since yesterday morning. -Risks and benefits of cardiac catheterization have been discussed with the patient. These include bleeding, infection, kidney damage, stroke, heart attack, death. The patient understands these risks and is willing to proceed.   For questions or updates, please contact [facility] Please consult [email or web address] for contact info under Cardiology/STEMI.   Signed, Slevin Gunby Verne Spurr, MD  03/06/2023 10:07 AM

## 2023-03-06 NOTE — ED Notes (Signed)
Echocardiogram in progress

## 2023-03-06 NOTE — ED Notes (Signed)
Echocardiogram still in progress. Pt states pain has significantly improved after fentanyl dose.

## 2023-03-06 NOTE — ED Notes (Signed)
RT at bedside. O2 sats 79-82%. Oxygen now at 10 L/min via nasal cannula. O2 sat now 97-100%.

## 2023-03-06 NOTE — Progress Notes (Addendum)
Called by nursing that patient complained of ongoing 8/10 chest pain as well as O2 sat 88% on 5L. Cath reviewed, severely volume overloaded by cath. IV Lasix, queued up to begin this evening. Patient appears uncomfortable and mildly diaphoretic, but able to speak in full sentences. Has bibasilar crackles on exam. She is mentating normally and is warm. Dr. Antoine Poche called to bedside who evaluated patient. I also had updated Dr. [place] to verify plan for AHF involvement this admission and then Dr. Elwyn Lade at Dr. Elvis Coil request. Dr. Antoine Poche does not feel patient is shocky but recommends expediting diuresis for decompensated heart failure. She is producing urine. She remains on a NTG drip with stable BP. He recommends to give first IV Lasix dose now (80mg ), switch fetanyl patch to PRN morphine, continue supplemental O2, give a dose of supplemental KCl, and will sign out to on call fellow to be check on her shortly.

## 2023-03-06 NOTE — Progress Notes (Signed)
*  PRELIMINARY RESULTS* Echocardiogram 2D Echocardiogram has been performed with Definity.  Stacey Drain 03/06/2023, 11:50 AM

## 2023-03-06 NOTE — Progress Notes (Signed)
PHARMACY - ANTICOAGULATION CONSULT NOTE  Pharmacy Consult for heparin dosing Indication: chest pain/ACS  Allergies  Allergen Reactions   Jardiance [Empagliflozin] Other (See Comments)    Recurrent UTI   Losartan Other (See Comments)    Hyperkalemia    Patient Measurements: Height: 5\' 3"  (160 cm) Weight: 85.7 kg (189 lb) IBW/kg (Calculated) : 52.4 Heparin Dosing Weight: 71.6 kg  Vital Signs: Temp: 97.8 F (36.6 C) (11/20 1524) Temp Source: Oral (11/20 1524) BP: 107/90 (11/20 1649) Pulse Rate: 98 (11/20 1400)  Labs: Recent Labs    03/06/23 0814 03/06/23 1013  HGB 10.5*  --   HCT 32.1*  --   PLT 187  --   CREATININE 0.96  --   TROPONINIHS 8,553* 10,249*    Estimated Creatinine Clearance: 60.6 mL/min (by C-G formula based on SCr of 0.96 mg/dL).   Medical History: Past Medical History:  Diagnosis Date   Acute anterior wall MI (HCC) 10/04/2015   Acute MI anterior wall first episode care [facility]) 10/04/2015   Coronary angiogram 10/04/2015: Occluded proximal LAD, superior takeoff of ramus intermediate, large circumflex, moderate to large RCA with mild diffuse disease. S/P thrombectomy, overlapping stents to the proximal LAD with 3.5 x 18, 3.5 x 12 mm Xience DES, 100% to 0%.    CAD (coronary artery disease), native coronary artery 10/11/2015   Coronary angiogram 10/04/2015: Left main mild calcification. LAD tortuous origin, occluded in the proximal segment. Diagonal 2 is moderate-sized, has proximal 50% stenosis. S/P thrombectomy & DES overlapping 3.5 x 18 and 3.5 x 12 mm Xience Alpine stents,   Ramus intermediate has a tortuous origin. Circumflex mild disease. Right coronary artery mild diffuse disease   Colitis    DM (diabetes mellitus), type 2, uncontrolled with complications 06/12/2018   H/O tobacco use, presenting hazards to health 06/12/2018   Quit in 2017 after MI   Hypercholesteremia 06/12/2018    Assessment: 67 YOF presenting w/ chest pain and SOB despite taking 5  nitroglycerin tablets. She is not on any other anticoagulation. Pharmacy consulted to start heparin for ACS.  Initial heparin level 0.27  Goal of Therapy:  Heparin level 0.3-0.7 units/ml Monitor platelets by anticoagulation protocol: Yes   Plan:  Increase heparin to 1000 units / hr Follow up after cath today or follow up heparin level in AM  Thank you Okey Regal, PharmD 03/06/2023,4:58 PM

## 2023-03-06 NOTE — ED Notes (Signed)
Call made to RT to see pt.

## 2023-03-07 ENCOUNTER — Encounter (HOSPITAL_COMMUNITY): Payer: Self-pay | Admitting: Internal Medicine

## 2023-03-07 ENCOUNTER — Inpatient Hospital Stay (HOSPITAL_COMMUNITY): Payer: Medicare Other

## 2023-03-07 DIAGNOSIS — J439 Emphysema, unspecified: Secondary | ICD-10-CM | POA: Diagnosis not present

## 2023-03-07 DIAGNOSIS — I214 Non-ST elevation (NSTEMI) myocardial infarction: Secondary | ICD-10-CM | POA: Diagnosis present

## 2023-03-07 DIAGNOSIS — I252 Old myocardial infarction: Secondary | ICD-10-CM | POA: Diagnosis not present

## 2023-03-07 DIAGNOSIS — D649 Anemia, unspecified: Secondary | ICD-10-CM | POA: Diagnosis present

## 2023-03-07 DIAGNOSIS — Z8249 Family history of ischemic heart disease and other diseases of the circulatory system: Secondary | ICD-10-CM | POA: Diagnosis not present

## 2023-03-07 DIAGNOSIS — R918 Other nonspecific abnormal finding of lung field: Secondary | ICD-10-CM | POA: Diagnosis not present

## 2023-03-07 DIAGNOSIS — Z7902 Long term (current) use of antithrombotics/antiplatelets: Secondary | ICD-10-CM | POA: Diagnosis not present

## 2023-03-07 DIAGNOSIS — E7800 Pure hypercholesterolemia, unspecified: Secondary | ICD-10-CM | POA: Diagnosis present

## 2023-03-07 DIAGNOSIS — R0602 Shortness of breath: Secondary | ICD-10-CM | POA: Diagnosis not present

## 2023-03-07 DIAGNOSIS — N390 Urinary tract infection, site not specified: Secondary | ICD-10-CM | POA: Diagnosis present

## 2023-03-07 DIAGNOSIS — Z7982 Long term (current) use of aspirin: Secondary | ICD-10-CM | POA: Diagnosis not present

## 2023-03-07 DIAGNOSIS — I5023 Acute on chronic systolic (congestive) heart failure: Secondary | ICD-10-CM | POA: Diagnosis present

## 2023-03-07 DIAGNOSIS — B9620 Unspecified Escherichia coli [E. coli] as the cause of diseases classified elsewhere: Secondary | ICD-10-CM | POA: Diagnosis present

## 2023-03-07 DIAGNOSIS — J811 Chronic pulmonary edema: Secondary | ICD-10-CM | POA: Diagnosis not present

## 2023-03-07 DIAGNOSIS — E669 Obesity, unspecified: Secondary | ICD-10-CM | POA: Diagnosis present

## 2023-03-07 DIAGNOSIS — I5021 Acute systolic (congestive) heart failure: Secondary | ICD-10-CM | POA: Diagnosis present

## 2023-03-07 DIAGNOSIS — Z79899 Other long term (current) drug therapy: Secondary | ICD-10-CM | POA: Diagnosis not present

## 2023-03-07 DIAGNOSIS — I2510 Atherosclerotic heart disease of native coronary artery without angina pectoris: Secondary | ICD-10-CM | POA: Diagnosis present

## 2023-03-07 DIAGNOSIS — R109 Unspecified abdominal pain: Secondary | ICD-10-CM | POA: Diagnosis not present

## 2023-03-07 DIAGNOSIS — J9811 Atelectasis: Secondary | ICD-10-CM | POA: Diagnosis present

## 2023-03-07 DIAGNOSIS — N200 Calculus of kidney: Secondary | ICD-10-CM | POA: Diagnosis not present

## 2023-03-07 DIAGNOSIS — Z794 Long term (current) use of insulin: Secondary | ICD-10-CM | POA: Diagnosis not present

## 2023-03-07 DIAGNOSIS — Z981 Arthrodesis status: Secondary | ICD-10-CM | POA: Diagnosis not present

## 2023-03-07 DIAGNOSIS — I255 Ischemic cardiomyopathy: Secondary | ICD-10-CM | POA: Diagnosis present

## 2023-03-07 DIAGNOSIS — E1165 Type 2 diabetes mellitus with hyperglycemia: Secondary | ICD-10-CM | POA: Diagnosis present

## 2023-03-07 DIAGNOSIS — N179 Acute kidney failure, unspecified: Secondary | ICD-10-CM | POA: Diagnosis present

## 2023-03-07 DIAGNOSIS — J9601 Acute respiratory failure with hypoxia: Secondary | ICD-10-CM | POA: Diagnosis present

## 2023-03-07 DIAGNOSIS — I272 Pulmonary hypertension, unspecified: Secondary | ICD-10-CM | POA: Diagnosis present

## 2023-03-07 DIAGNOSIS — Z87891 Personal history of nicotine dependence: Secondary | ICD-10-CM | POA: Diagnosis not present

## 2023-03-07 DIAGNOSIS — E66811 Obesity, class 1: Secondary | ICD-10-CM | POA: Diagnosis present

## 2023-03-07 DIAGNOSIS — I509 Heart failure, unspecified: Secondary | ICD-10-CM | POA: Diagnosis not present

## 2023-03-07 DIAGNOSIS — Z951 Presence of aortocoronary bypass graft: Secondary | ICD-10-CM | POA: Diagnosis not present

## 2023-03-07 DIAGNOSIS — I5082 Biventricular heart failure: Secondary | ICD-10-CM | POA: Diagnosis present

## 2023-03-07 LAB — BASIC METABOLIC PANEL
Anion gap: 12 (ref 5–15)
BUN: 35 mg/dL — ABNORMAL HIGH (ref 8–23)
CO2: 25 mmol/L (ref 22–32)
Calcium: 8.6 mg/dL — ABNORMAL LOW (ref 8.9–10.3)
Chloride: 97 mmol/L — ABNORMAL LOW (ref 98–111)
Creatinine, Ser: 1.55 mg/dL — ABNORMAL HIGH (ref 0.44–1.00)
GFR, Estimated: 37 mL/min — ABNORMAL LOW (ref >60)
Glucose, Bld: 249 mg/dL — ABNORMAL HIGH (ref 70–99)
Potassium: 4 mmol/L (ref 3.5–5.1)
Sodium: 134 mmol/L — ABNORMAL LOW (ref 135–145)

## 2023-03-07 LAB — COMPREHENSIVE METABOLIC PANEL
ALT: 59 U/L — ABNORMAL HIGH (ref 0–44)
AST: 80 U/L — ABNORMAL HIGH (ref 15–41)
Albumin: 3.3 g/dL — ABNORMAL LOW (ref 3.5–5.0)
Alkaline Phosphatase: 72 U/L (ref 38–126)
Anion gap: 8 (ref 5–15)
BUN: 27 mg/dL — ABNORMAL HIGH (ref 8–23)
CO2: 27 mmol/L (ref 22–32)
Calcium: 8.9 mg/dL (ref 8.9–10.3)
Chloride: 100 mmol/L (ref 98–111)
Creatinine, Ser: 1.03 mg/dL — ABNORMAL HIGH (ref 0.44–1.00)
GFR, Estimated: >60 mL/min (ref >60)
Glucose, Bld: 195 mg/dL — ABNORMAL HIGH (ref 70–99)
Potassium: 4.4 mmol/L (ref 3.5–5.1)
Sodium: 135 mmol/L (ref 135–145)
Total Bilirubin: 1 mg/dL (ref <1.2)
Total Protein: 6.9 g/dL (ref 6.5–8.1)

## 2023-03-07 LAB — URINALYSIS, ROUTINE W REFLEX MICROSCOPIC
Bilirubin Urine: NEGATIVE
Glucose, UA: NEGATIVE mg/dL
Ketones, ur: NEGATIVE mg/dL
Nitrite: NEGATIVE
Protein, ur: NEGATIVE mg/dL
Specific Gravity, Urine: 1.012 (ref 1.005–1.030)
pH: 5 (ref 5.0–8.0)

## 2023-03-07 LAB — LIPID PANEL
Cholesterol: 113 mg/dL (ref 0–200)
HDL: 57 mg/dL (ref >40)
LDL Cholesterol: 38 mg/dL (ref 0–99)
Total CHOL/HDL Ratio: 2 {ratio}
Triglycerides: 91 mg/dL (ref <150)
VLDL: 18 mg/dL (ref 0–40)

## 2023-03-07 LAB — CBC
HCT: 30.4 % — ABNORMAL LOW (ref 36.0–46.0)
Hemoglobin: 10 g/dL — ABNORMAL LOW (ref 12.0–15.0)
MCH: 29.1 pg (ref 26.0–34.0)
MCHC: 32.9 g/dL (ref 30.0–36.0)
MCV: 88.4 fL (ref 80.0–100.0)
Platelets: 209 10*3/uL (ref 150–400)
RBC: 3.44 MIL/uL — ABNORMAL LOW (ref 3.87–5.11)
RDW: 13 % (ref 11.5–15.5)
WBC: 10.2 10*3/uL (ref 4.0–10.5)
nRBC: 0 % (ref 0.0–0.2)

## 2023-03-07 LAB — HEMOGLOBIN A1C
Hgb A1c MFr Bld: 7.9 % — ABNORMAL HIGH (ref 4.8–5.6)
Mean Plasma Glucose: 180.03 mg/dL

## 2023-03-07 LAB — GLUCOSE, CAPILLARY
Glucose-Capillary: 181 mg/dL — ABNORMAL HIGH (ref 70–99)
Glucose-Capillary: 190 mg/dL — ABNORMAL HIGH (ref 70–99)
Glucose-Capillary: 194 mg/dL — ABNORMAL HIGH (ref 70–99)
Glucose-Capillary: 220 mg/dL — ABNORMAL HIGH (ref 70–99)
Glucose-Capillary: 269 mg/dL — ABNORMAL HIGH (ref 70–99)
Glucose-Capillary: 275 mg/dL — ABNORMAL HIGH (ref 70–99)

## 2023-03-07 MED ORDER — CLOPIDOGREL BISULFATE 75 MG PO TABS
75.0000 mg | ORAL_TABLET | Freq: Every day | ORAL | Status: DC
  Administered 2023-03-08 – 2023-03-13 (×6): 75 mg via ORAL
  Filled 2023-03-07 (×6): qty 1

## 2023-03-07 MED ORDER — INSULIN GLARGINE-YFGN 100 UNIT/ML ~~LOC~~ SOLN: 15.0000 [IU] | Freq: Every day | SUBCUTANEOUS | Status: DC

## 2023-03-07 MED ORDER — SPIRONOLACTONE 12.5 MG HALF TABLET
12.5000 mg | ORAL_TABLET | Freq: Every day | ORAL | Status: DC
  Administered 2023-03-07 – 2023-03-11 (×5): 12.5 mg via ORAL
  Filled 2023-03-07 (×5): qty 1

## 2023-03-07 MED ORDER — SODIUM CHLORIDE 0.9 % IV SOLN
1.0000 g | INTRAVENOUS | Status: DC
  Administered 2023-03-07 – 2023-03-09 (×3): 1 g via INTRAVENOUS
  Filled 2023-03-07 (×3): qty 10

## 2023-03-07 MED ORDER — ORAL CARE MOUTH RINSE: 15.0000 mL | OROMUCOSAL | Status: DC | PRN

## 2023-03-07 MED ORDER — FUROSEMIDE 10 MG/ML IJ SOLN
160.0000 mg | Freq: Once | INTRAVENOUS | Status: AC
  Administered 2023-03-07: 160 mg via INTRAVENOUS
  Filled 2023-03-07: qty 10

## 2023-03-07 MED ORDER — CLOPIDOGREL BISULFATE 300 MG PO TABS
300.0000 mg | ORAL_TABLET | Freq: Once | ORAL | Status: AC
  Administered 2023-03-07: 300 mg via ORAL
  Filled 2023-03-07: qty 1

## 2023-03-07 MED ORDER — INSULIN GLARGINE-YFGN 100 UNIT/ML ~~LOC~~ SOLN
15.0000 [IU] | Freq: Every day | SUBCUTANEOUS | Status: DC
  Administered 2023-03-07 – 2023-03-09 (×3): 15 [IU] via SUBCUTANEOUS
  Filled 2023-03-07 (×4): qty 0.15

## 2023-03-07 MED ORDER — POTASSIUM CHLORIDE CRYS ER 20 MEQ PO TBCR: 20.0000 meq | EXTENDED_RELEASE_TABLET | Freq: Every day | ORAL | Status: DC

## 2023-03-07 MED ORDER — POTASSIUM CHLORIDE CRYS ER 20 MEQ PO TBCR
20.0000 meq | EXTENDED_RELEASE_TABLET | Freq: Once | ORAL | Status: AC
  Administered 2023-03-07: 20 meq via ORAL
  Filled 2023-03-07: qty 1

## 2023-03-07 MED ORDER — OXYCODONE HCL 5 MG PO TABS
10.0000 mg | ORAL_TABLET | ORAL | Status: DC | PRN
  Administered 2023-03-07 – 2023-03-13 (×23): 10 mg via ORAL
  Filled 2023-03-07 (×24): qty 2

## 2023-03-07 MED ORDER — LIDOCAINE 5 % EX PTCH
1.0000 | MEDICATED_PATCH | CUTANEOUS | Status: DC
  Administered 2023-03-07 – 2023-03-10 (×4): 1 via TRANSDERMAL
  Filled 2023-03-07 (×6): qty 1

## 2023-03-07 MED ORDER — FUROSEMIDE 10 MG/ML IJ SOLN
160.0000 mg | Freq: Once | INTRAMUSCULAR | Status: AC
  Administered 2023-03-07: 160 mg via INTRAVENOUS
  Filled 2023-03-07: qty 16

## 2023-03-07 MED ORDER — ACETAZOLAMIDE 250 MG PO TABS
250.0000 mg | ORAL_TABLET | Freq: Once | ORAL | Status: AC
Start: 2023-03-07 — End: 2023-03-07
  Administered 2023-03-07: 250 mg via ORAL
  Filled 2023-03-07: qty 1

## 2023-03-07 NOTE — Progress Notes (Signed)
Heart Failure Navigator Progress Note  Assessed for Heart & Vascular TOC clinic readiness.  Patient does not meet criteria due to Advanced Heart Failure Team consulted. .   Navigator will sign off at this time.   Dawn Fields, BSN, RN Heart Failure Nurse Navigator Secure Chat Only   

## 2023-03-07 NOTE — Plan of Care (Signed)
Pt progressing

## 2023-03-07 NOTE — Plan of Care (Signed)
  Problem: Education: Goal: Knowledge of General Education information will improve Description: Including pain rating scale, medication(s)/side effects and non-pharmacologic comfort measures Outcome: Progressing   Problem: Health Behavior/Discharge Planning: Goal: Ability to manage health-related needs will improve Outcome: Progressing   Problem: Clinical Measurements: Goal: Ability to maintain clinical measurements within normal limits will improve Outcome: Progressing Goal: Will remain free from infection Outcome: Progressing Goal: Diagnostic test results will improve Outcome: Progressing Goal: Respiratory complications will improve Outcome: Progressing Goal: Cardiovascular complication will be avoided Outcome: Progressing   Problem: Activity: Goal: Risk for activity intolerance will decrease Outcome: Progressing   Problem: Nutrition: Goal: Adequate nutrition will be maintained Outcome: Progressing   Problem: Coping: Goal: Level of anxiety will decrease Outcome: Progressing   Problem: Elimination: Goal: Will not experience complications related to bowel motility Outcome: Progressing Goal: Will not experience complications related to urinary retention Outcome: Progressing   Problem: Pain Management: Goal: General experience of comfort will improve Outcome: Progressing   Problem: Safety: Goal: Ability to remain free from injury will improve Outcome: Progressing   Problem: Skin Integrity: Goal: Risk for impaired skin integrity will decrease Outcome: Progressing   Problem: Education: Goal: Ability to describe self-care measures that may prevent or decrease complications (Diabetes Survival Skills Education) will improve Outcome: Progressing Goal: Individualized Educational Video(s) Outcome: Progressing   Problem: Coping: Goal: Ability to adjust to condition or change in health will improve Outcome: Progressing   Problem: Fluid Volume: Goal: Ability to  maintain a balanced intake and output will improve Outcome: Progressing   Problem: Health Behavior/Discharge Planning: Goal: Ability to identify and utilize available resources and services will improve Outcome: Progressing Goal: Ability to manage health-related needs will improve Outcome: Progressing   Problem: Metabolic: Goal: Ability to maintain appropriate glucose levels will improve Outcome: Progressing   Problem: Nutritional: Goal: Maintenance of adequate nutrition will improve Outcome: Progressing Goal: Progress toward achieving an optimal weight will improve Outcome: Progressing   Problem: Skin Integrity: Goal: Risk for impaired skin integrity will decrease Outcome: Progressing   Problem: Tissue Perfusion: Goal: Adequacy of tissue perfusion will improve Outcome: Progressing   Problem: Education: Goal: Understanding of cardiac disease, CV risk reduction, and recovery process will improve Outcome: Progressing Goal: Individualized Educational Video(s) Outcome: Progressing   Problem: Activity: Goal: Ability to tolerate increased activity will improve Outcome: Progressing   Problem: Cardiac: Goal: Ability to achieve and maintain adequate cardiovascular perfusion will improve Outcome: Progressing   Problem: Health Behavior/Discharge Planning: Goal: Ability to safely manage health-related needs after discharge will improve Outcome: Progressing   Problem: Education: Goal: Understanding of CV disease, CV risk reduction, and recovery process will improve Outcome: Progressing Goal: Individualized Educational Video(s) Outcome: Progressing   Problem: Activity: Goal: Ability to return to baseline activity level will improve Outcome: Progressing   Problem: Cardiovascular: Goal: Ability to achieve and maintain adequate cardiovascular perfusion will improve Outcome: Progressing Goal: Vascular access site(s) Level 0-1 will be maintained Outcome: Progressing    Problem: Health Behavior/Discharge Planning: Goal: Ability to safely manage health-related needs after discharge will improve Outcome: Progressing

## 2023-03-07 NOTE — Consult Note (Signed)
Advanced Heart Failure Team Consult Note   Primary Physician: Richardean Chimera, MD PCP-Cardiologist:  None  Reason for Consultation: Acute on Chronic Systolic Heart Failure  HPI:    Sonia Morales is seen today for evaluation of acute on chronic systolic heart failure at the request of Dr. [place].   Sonia Morales is a 67 y.o. female with past medical history of HFrEF, CAD manifested by STEMI in 2017 s/p PCI to LAD, NSTEMI c/b cardiogenic shock s/p emergent CABG (LIMA to LAD, SVG to OM) in 2022, and type 2 diabetes.  3/23 Echo: 40-45% (improved from 35% pre-CABG in 9/22) GIDD, diffuse LV regional wall motion hypokinesis  Last seen in Cardiology clinic 12/03/22 for 6 month follow up. At the time she had been doing well without recurrence of angina.  She presented to [facility] with angina radiating to her jaw and right shoulder overnight. She had taken 3 SL NTG every 5 minutes followed by  5 SL every 10 minutes to get through the night. She has been dyspneic for the past few days with orthopnea. In ED was found to be hypoxic requiring 10L nasal cannula to maintain O2.She received ASA 325 loaded and started on heparin gtt. Labs notable for Trops 8553>10249, K 4.3, BUN/sCr 29/0.96. EKG SR 102 bpm with LBBB and ST depression in leads II and V4-6. CXR with pulmonary edema.Taken for echo/heart cath by Dr. [place]az[place]and. She was then transferred to the ICU and started on nitroglycerin gtt and Lasix 80 IV TID.   Echo with EF 20-25%, entire septum and apex are akinetic,anterior wall, entire lateral wall, and entire inferior wall are hypokinetic, Rvnl. Mild MR and TR .  Cath showed critical left main coronary stenosis (mLM to dLM 95%, ost LAD to pLAD 100%, ostCx to pCx 100%), patent LIMA to LAD, patent SVG to OM, severely elevated LVEDP 42, PCWP 33, PAP 65/33/47, RA 23, CO/CI 4.39/2.33.  On exam this morning, she reports that her chest pain has been tolerable since starting the nitroglycerin gtt, however complains  of constant L flank pain that started yesterday after arriving to the ED. She states that she has history of recurrent UTIs requiring antibiotics and have been complicated by renal stones in the past. She reports that the pain is similar. Will get UA and renal US.   Home Medications Prior to Admission medications   Medication Sig Start Date End Date Taking? Authorizing Provider  aspirin EC 81 MG EC tablet Take 1 tablet (81 mg total) by mouth daily. Swallow whole. 01/12/21  Yes Barrett, Erin R, PA-C  atorvastatin (LIPITOR) 80 MG tablet Take 1 tablet (80 mg total) by mouth daily at 6 PM. Patient taking differently: Take 80 mg by mouth at bedtime. 10/06/15  Yes Yates Decamp, MD  FLUoxetine (PROZAC) 40 MG capsule Take 2 capsules (80 mg total) by mouth 2 (two) times daily. 05/10/21  Yes Cantwell, Celeste C, PA-C  gabapentin (NEURONTIN) 600 MG tablet Take 600 mg by mouth See admin instructions. Pt takes 600 mg by mouth every night every single night and then pt takes 600mg  by mouth every other day. Pt's granddaughter states that she cannot take 600mg  by mouth two times daily consistently due to drowsiness. 11/14/22  Yes [provider]  insulin aspart protamine- aspart (NOVOLOG MIX 70/30) (70-30) 100 UNIT/ML injection Inject 40 Units into the skin 2 (two) times daily.   Yes [provider]  insulin NPH-regular Human (70-30) 100 UNIT/ML injection Inject 48 Units into  the skin daily.   Yes [provider]  Multiple Vitamins-Minerals (ONE-A-DAY WOMENS PO) Take 1 tablet by mouth daily.   Yes [provider]  nitroGLYCERIN (NITROSTAT) 0.4 MG SL tablet Place 1 tablet (0.4 mg total) under the tongue every 5 (five) minutes as needed for chest pain. 12/03/22  Yes Yates Decamp, MD  Oxycodone HCl 10 MG TABS Take 10 mg by mouth 5 (five) times daily.   Yes [provider]  Semaglutide, 1 MG/DOSE, (OZEMPIC, 1 MG/DOSE,) 4 MG/3ML SOPN Inject 1 mg into the skin once a week.   Yes  [provider]  traZODone (DESYREL) 50 MG tablet Take 75 mg by mouth at bedtime. 07/19/18  Yes [provider]  Accu-Chek Softclix Lancets lancets Use as instructed to monitor glucose 4 times daily 09/15/20   Dani Gobble, NP  diclofenac Sodium (VOLTAREN) 1 % GEL Apply 1 application  topically daily as needed (pain). Patient not taking: Reported on 03/06/2023    [provider]  glucose blood (ACCU-CHEK GUIDE) test strip Use as instructed to monitor glucose 4 times daily 09/15/20   Dani Gobble, NP    Past Medical History: Past Medical History:  Diagnosis Date   Acute anterior wall MI (HCC) 10/04/2015   Acute MI anterior wall first episode care [facility]) 10/04/2015   Coronary angiogram 10/04/2015: Occluded proximal LAD, superior takeoff of ramus intermediate, large circumflex, moderate to large RCA with mild diffuse disease. S/P thrombectomy, overlapping stents to the proximal LAD with 3.5 x 18, 3.5 x 12 mm Xience DES, 100% to 0%.    CAD (coronary artery disease), native coronary artery 10/11/2015   Coronary angiogram 10/04/2015: Left main mild calcification. LAD tortuous origin, occluded in the proximal segment. Diagonal 2 is moderate-sized, has proximal 50% stenosis. S/P thrombectomy & DES overlapping 3.5 x 18 and 3.5 x 12 mm Xience Alpine stents,   Ramus intermediate has a tortuous origin. Circumflex mild disease. Right coronary artery mild diffuse disease   Colitis    DM (diabetes mellitus), type 2, uncontrolled with complications 06/12/2018   H/O tobacco use, presenting hazards to health 06/12/2018   Quit in 2017 after MI   Hypercholesteremia 06/12/2018    Past Surgical History: Past Surgical History:  Procedure Laterality Date   CARDIAC CATHETERIZATION N/A 10/04/2015   Procedure: Left Heart Cath and Coronary Angiography;  Surgeon: Yates Decamp, MD;  Location: [facility] INVASIVE CV LAB;  Service: Cardiovascular;  Laterality: N/A;   CARDIAC CATHETERIZATION  10/04/2015    Procedure: Coronary Stent Intervention;  Surgeon: Yates Decamp, MD;  Location: [facility] INVASIVE CV LAB;  Service: Cardiovascular;;   CORONARY ARTERY BYPASS GRAFT N/A 01/06/2021   Procedure: CORONARY ARTERY BYPASS GRAFTING (CABG), ON PUMP, TIMES TWO USING LEFT INTERNAL MAMMARY ARTERY AND RIGHT ENDOSCOPICALLY HARVESTED GREATER SAPHENOUS VEIN;  Surgeon: Corliss Skains, MD;  Location: [facility] OR;  Service: Open Heart Surgery;  Laterality: N/A;   IABP INSERTION N/A 01/05/2021   Procedure: IABP INSERTION;  Surgeon: Yates Decamp, MD;  Location: [facility] INVASIVE CV LAB;  Service: Cardiovascular;  Laterality: N/A;   PERIPHERAL VASCULAR CATHETERIZATION  10/04/2015   Procedure: Thrombectomy;  Surgeon: Yates Decamp, MD;  [facility]ASIVE CV LAB;  Service: Cardiovascular;;   RIGHT/LEFT HEART CATH AND CORONARY ANGIOGRAPHY N/A 01/02/2021   Procedure: RIGHT/LEFT HEART CATH AND CORONARY ANGIOGRAPHY;  Surgeon: Elder Negus, MD;  Location: [facility] INVASIVE CV LAB;  Service: Cardiovascular;  Laterality: N/A;   TEE WITHOUT CARDIOVERSION N/A 01/06/2021   Procedure: TRANSESOPHAGEAL ECHOCARDIOGRAM (  TEE);  Surgeon: Corliss Skains, MD;  Location: [facility] OR;  Service: Open Heart Surgery;  Laterality: N/A;    Family History: Family History  Problem Relation Age of Onset   Heart attack Mother    Cancer Brother     Social History: Social History   Socioeconomic History   Marital status: Married    Spouse name: Tekesha Wery   Number of children: 2   Years of education: Not on file   Highest education level: High school graduate  Occupational History   Occupation: disability  Tobacco Use   Smoking status: Former    Current packs/day: 0.00    Average packs/day: 1 pack/day for 30.0 years (30.0 ttl pk-yrs)    Types: Cigarettes    Start date: 26    Quit date: 2017    Years since quitting: 7.8   Smokeless tobacco: Never  Vaping Use   Vaping status: Former   Start date: 11/15/2019   Quit date: 11/28/2020   Substances:  Nicotine  Substance and Sexual Activity   Alcohol use: Never   Drug use: Never   Sexual activity: Not on file  Other Topics Concern   Not on file  Social History Narrative   Not on file   Social Determinants of Health   Financial Resource Strain: Low Risk  (01/04/2021)   Overall Financial Resource Strain (CARDIA)    Difficulty of Paying Living Expenses: Not very hard  Food Insecurity: No Food Insecurity (03/07/2023)   Hunger Vital Sign    Worried About Running Out of Food in the Last Year: Never true    Ran Out of Food in the Last Year: Never true  Transportation Needs: No Transportation Needs (03/07/2023)   PRAPARE - Administrator, Civil Service (Medical): No    Lack of Transportation (Non-Medical): No  Physical Activity: Not on file  Stress: Not on file  Social Connections: Not on file    Allergies:  Allergies  Allergen Reactions   Jardiance [Empagliflozin] Other (See Comments)    Recurrent UTI   Losartan Other (See Comments)    Hyperkalemia    Objective:    Vital Signs:   Temp:  [97.6 F (36.4 C)-98.8 F (37.1 C)] 98.8 F (37.1 C) (11/21 0300) Pulse Rate:  [95-111] 107 (11/21 0630) Resp:  [15-33] 20 (11/21 0630) BP: (82-146)/(49-129) 116/66 (11/21 0630) SpO2:  [79 %-95 %] 92 % (11/21 0630) Weight:  [84 kg-85.7 kg] 84 kg (11/21 0500) Last BM Date : 03/05/23  Weight change: Filed Weights   03/06/23 0805 03/07/23 0500  Weight: 85.7 kg 84 kg    Intake/Output:   Intake/Output Summary (Last 24 hours) at 03/07/2023 0703 Last data filed at 03/07/2023 0000 Gross per 24 hour  Intake 125 ml  Output 1950 ml  Net -1825 ml      Physical Exam    General: Chronically ill appearing.  HEENT: neck supple.   Cardiac: JVP to jaw. S1 and S2 present. No murmurs or rub. Resp: Lung sounds clear and equal B/L Abdomen: Soft, non-tender, non-distended. + BS. Extremities: Warm and dry. No rash, cyanosis.  Central edema.  Neuro: Alert and oriented x3.  Affect pleasant. Moves all extremities without difficulty. Lines/Devices:  external catheter  Telemetry   SR 80s (personally reviewed)  EKG    ST 102 bpm with LBBB and ST depression in leads II and V4-6  Labs   Basic Metabolic Panel: Recent Labs  Lab 03/06/23 0814 03/06/23 1743 03/06/23 1748 03/07/23  0258  NA 133* 137  138 140 135  K 4.3 3.8  3.9 3.7 4.4  CL 100  --   --  100  CO2 21*  --   --  27  GLUCOSE 212*  --   --  195*  BUN 29*  --   --  27*  CREATININE 0.96  --   --  1.03*  CALCIUM 8.5*  --   --  8.9    Liver Function Tests: Recent Labs  Lab 03/07/23 0258  AST 80*  ALT 59*  ALKPHOS 72  BILITOT 1.0  PROT 6.9  ALBUMIN 3.3*   No results for input(s): "LIPASE", "AMYLASE" in the last 168 hours. No results for input(s): "AMMONIA" in the last 168 hours.  CBC: Recent Labs  Lab 03/06/23 0814 03/06/23 1743 03/06/23 1748 03/07/23 0258  WBC 11.0*  --   --  10.2  HGB 10.5* 10.2*  10.5* 9.9* 10.0*  HCT 32.1* 30.0*  31.0* 29.0* 30.4*  MCV 91.5  --   --  88.4  PLT 187  --   --  209    Cardiac Enzymes: No results for input(s): "CKTOTAL", "CKMB", "CKMBINDEX", "TROPONINI" in the last 168 hours.  BNP: BNP (last 3 results) Recent Labs    03/06/23 0814  BNP 2,236.0*    ProBNP (last 3 results) No results for input(s): "PROBNP" in the last 8760 hours.   CBG: Recent Labs  Lab 03/06/23 1400 03/06/23 1546 03/06/23 2050 03/07/23 0014 03/07/23 0258  GLUCAP 178* 166* 197* 190* 181*    Coagulation Studies: No results for input(s): "LABPROT", "INR" in the last 72 hours.   Imaging   CARDIAC CATHETERIZATION  Addendum Date: 03/06/2023     Mid LM to Dist LM lesion is 95% stenosed.   Ost LAD to Prox LAD lesion is 100% stenosed.   Ost Cx to Prox Cx lesion is 100% stenosed.   1st Mrg lesion is 80% stenosed.   Prox RCA to Mid RCA lesion is 30% stenosed.   LIMA graft was visualized by non-selective angiography and is large.   SVG graft was visualized by  angiography and is large.   The graft exhibits no disease.   The graft exhibits no disease.   LV end diastolic pressure is severely elevated.   Hemodynamic findings consistent with severe pulmonary hypertension. Critical left main coronary stenosis Patent LIMA to the LAD, no significant subclavian gradient by pullback. Patent SVG to OM. Ramus intermediate was not grafted. RCA is without significant disease Severely elevated LV filling pressures. LVEDP 42 mm Hg. PCWP 36/43 mean 33 mm Hg. Severe elevated pulmonary pressures. PAP 65/33 mean 47 mm Hg. RA pressure elevated with mean 23 mm Hg Cardiac output 4.39 L/min, index 2.33 Plan: patient has advanced heart failure. Lactate is normal. She is normotensive and not acidotic. Needs aggressive diuresis and optimization of CHF therapy. Will ask Advanced heart failure team to see.   Result Date: 03/06/2023 See surgical note for result.  ECHOCARDIOGRAM COMPLETE  Result Date: 03/06/2023    ECHOCARDIOGRAM REPORT   Patient Name:   NYIESHA EMMITT Date of Exam: 03/06/2023 Medical Rec #:  [id number]      Height:       63.0 in Accession #:    [id number]     Weight:       189.0 lb Date of Birth:  1955-07-28       BSA:          1.888 m Patient Age:  67 years       BP:           113/71 mmHg Patient Gender: F              HR:           104 bpm. [facility] Procedure: 2D Echo, Cardiac Doppler, Color Doppler and Intracardiac            Opacification Agent Indications:    NSTEMI l21.4  History:        Patient has prior history of Echocardiogram examinations, most                 recent 01/01/2021. CHF, CAD and Previous Myocardial Infarction;                 Risk Factors:Diabetes, Dyslipidemia and Former Smoker.  Sonographer:    Celesta Gentile RCS Referring Phys: [id number] Ellsworth Lennox IMPRESSIONS  1. Left ventricular ejection fraction, by estimation, is 20 to 25%. The left ventricle has severely decreased function. The left ventricle demonstrates regional wall  motion abnormalities (see scoring diagram/findings for description). There is mild asymmetric left ventricular hypertrophy of the lateral segment. Indeterminate diastolic filling due to E-A fusion.  2. Right ventricular systolic function is severely reduced. The right ventricular size is normal. There is normal pulmonary artery systolic pressure.  3. Left atrial size was moderately dilated.  4. The mitral valve is normal in structure. Mild mitral valve regurgitation. No evidence of mitral stenosis.  5. The aortic valve has an indeterminant number of cusps. Aortic valve regurgitation is not visualized. No aortic stenosis is present.  6. The inferior vena cava is dilated in size with <50% respiratory variability, suggesting right atrial pressure of 15 mmHg. Comparison(s): Changes from prior study are noted. LVEF and RV systolic function worsened. FINDINGS  Left Ventricle: Left ventricular ejection fraction, by estimation, is 20 to 25%. The left ventricle has severely decreased function. The left ventricle demonstrates regional wall motion abnormalities. Definity contrast agent was given IV to delineate the left ventricular endocardial borders. The left ventricular internal cavity size was normal in size. There is mild asymmetric left ventricular hypertrophy of the lateral segment. Indeterminate diastolic filling due to E-A fusion.  LV Wall Scoring: The entire septum and apex are akinetic. The entire anterior wall, entire lateral wall, and entire inferior wall are hypokinetic. Right Ventricle: The right ventricular size is normal. No increase in right ventricular wall thickness. Right ventricular systolic function is severely reduced. There is normal pulmonary artery systolic pressure. The tricuspid regurgitant velocity is 2.05 m/s, and with an assumed right atrial pressure of 15 mmHg, the estimated right ventricular systolic pressure is 31.8 mmHg. Left Atrium: Left atrial size was moderately dilated. Right Atrium:  Right atrial size was normal in size. Pericardium: There is no evidence of pericardial effusion. Mitral Valve: The mitral valve is normal in structure. Mild mitral valve regurgitation. No evidence of mitral valve stenosis. Tricuspid Valve: The tricuspid valve is normal in structure. Tricuspid valve regurgitation is mild . No evidence of tricuspid stenosis. Aortic Valve: The aortic valve has an indeterminant number of cusps. Aortic valve regurgitation is not visualized. No aortic stenosis is present. Pulmonic Valve: The pulmonic valve was not well visualized. Pulmonic valve regurgitation is trivial. No evidence of pulmonic stenosis. Aorta: The aortic root is normal in size and structure. Venous: The inferior vena cava is dilated in size with less than 50% respiratory variability, suggesting right atrial pressure  of 15 mmHg. IAS/Shunts: No atrial level shunt detected by color flow Doppler.  LEFT VENTRICLE PLAX 2D LVIDd:         5.30 cm LVIDs:         4.60 cm LV PW:         1.20 cm LV IVS:        0.70 cm LVOT diam:     2.00 cm LV SV:         32 LV SV Index:   17 LVOT Area:     3.14 cm  LV Volumes (MOD) LV vol d, MOD A2C: 150.0 ml LV vol d, MOD A4C: 128.0 ml LV vol s, MOD A2C: 111.0 ml LV vol s, MOD A4C: 84.0 ml LV SV MOD A2C:     39.0 ml LV SV MOD A4C:     128.0 ml LV SV MOD BP:      43.9 ml RIGHT VENTRICLE RV S prime:     6.85 cm/s TAPSE (M-mode): 1.4 cm LEFT ATRIUM             Index        RIGHT ATRIUM           Index LA diam:        4.20 cm 2.22 cm/m   RA Area:     18.10 cm LA Vol (A2C):   76.5 ml 40.53 ml/m  RA Volume:   49.40 ml  26.17 ml/m LA Vol (A4C):   77.4 ml 41.00 ml/m LA Biplane Vol: 80.2 ml 42.49 ml/m  AORTIC VALVE LVOT Vmax:   56.40 cm/s LVOT Vmean:  38.600 cm/s LVOT VTI:    0.103 m  AORTA Ao Root diam: 3.30 cm MITRAL VALVE                  TRICUSPID VALVE MV Area (PHT): 5.38 cm       TR Peak grad:   16.8 mmHg MV Decel Time: 141 msec       TR Vmax:        205.00 cm/s MR Peak grad:    100.0 mmHg MR  Mean grad:    61.0 mmHg    SHUNTS MR Vmax:         500.00 cm/s  Systemic VTI:  0.10 m MR Vmean:        366.0 cm/s   Systemic Diam: 2.00 cm MR PISA:         1.01 cm MR PISA Eff ROA: 5 mm MR PISA Radius:  0.40 cm MV E velocity: 130.00 cm/s MV A velocity: 82.00 cm/s MV E/A ratio:  1.59 Vishnu Priya Mallipeddi Electronically signed by Winfield Rast Mallipeddi Signature Date/Time: 03/06/2023/2:56:51 PM    Final    DG Chest Portable 1 View  Result Date: 03/06/2023 CLINICAL DATA:  67 year old female with chest pain, shortness of breath since yesterday. EXAM: PORTABLE CHEST 1 VIEW COMPARISON:  Chest radiographs 02/23/2021. FINDINGS: Portable AP semi upright view at 0818 hours. Chronic CABG. Mild cardiomegaly. Other mediastinal contours are within normal limits. Coarse increased bilateral pulmonary interstitial opacity, slightly asymmetric and greater on the left. No pneumothorax, consolidation. No pleural effusion identified. Previous anterior and posterior cervical spine fusion partially visible. No acute osseous abnormality identified. Paucity of bowel gas the visible abdomen. IMPRESSION: Chronic CABG, mild cardiomegaly with diffuse increased pulmonary interstitial opacity favored to be acute pulmonary edema. Viral/atypical respiratory infection felt less likely. Electronically Signed   By: Odessa Fleming M.D.   On: 03/06/2023  09:32     Medications:     Current Medications:  aspirin EC  81 mg Oral Daily   atorvastatin  80 mg Oral QHS   Chlorhexidine Gluconate Cloth  6 each Topical Daily   FLUoxetine  80 mg Oral BID   furosemide  80 mg Intravenous Q8H   gabapentin  600 mg Oral TID   heparin  5,000 Units Subcutaneous Q8H   insulin aspart  0-15 Units Subcutaneous TID WC   sodium chloride flush  3 mL Intravenous Q12H   traZODone  50 mg Oral QHS    Infusions:  nitroGLYCERIN 35 mcg/min (03/06/23 1900)      Patient Profile   Mrs. Montera is a 67 y.o. female with past medical history of HFrEF, CAD  manifested by STEMI in 2017 s/p PCI to LAD, NSTEMI c/b cardiogenic shock s/p emergent CABG (LIMA to LAD, SVG to OM) in 2022, and type 2 diabetes.  Now admitted for NSTEMI.   Assessment/Plan   A/C HFrEF - ischemic cardiomyopathy - 3/23 Echo: 40-45% (improved from 35% pre-CABG in 9/22) GIDD, diffuse LV regional wall motion hypokinesis - Echo EF 20-25%. Rv nl. Mild MR and TR. - RHC: severely elevated LVEDP 42, PCWP 33, PAP 65/33/47, RA 23, CO/CI 4.39/2.33 - Aggressive diuresis. Lasix 80 IV tid increased to 160mg  bid - Plan to restart Losartan 12.5 tomorrow  - No SGLT2i with recurrent UTIs  2. NSTEMI CAD - STEMI in 2017 s/p PCI to LAD, NSTEMI c/b cardiogenic shock s/p emergent CABG (LIMA to LAD, SVG to OM) in 2022 - LHC: critical left main coronary stenosis (mLM to dLM 95%, ost LAD to pLAD 100%, ostCx to pCx 100%), patent LIMA to LAD, patent SVG to OM,  - Continue Asprin 81 - Continue Plavix  - Continue Lipitor - Nitroglycerin gtt for SBP goal <100. Currently at 58mcg/min.  3. Recurrent UTIs - 10/11 bilateral non-obstructing renal calculi noted on CT scan - Severe R flank pain, reportedly similar to prior pain associated with kidney stones - Renal US normal without evidence of previous renal calculi - UA pending  4. DMII - SSI per primary team  Length of Stay: 0  [place] Joanann Mies, NP  03/07/2023, 7:03 AM  Advanced Heart Failure Team Pager [phone] (M-F; 7a - 5p)  Please contact [facility] for night-coverage after hours (4p -7a ) and weekends on [email or web address]

## 2023-03-07 NOTE — Progress Notes (Signed)
Interventional Cardiology Rounding Note    Patient Name: Sonia Morales Date of Encounter: 03/07/2023  [facility] Cardiologist: Jacinto Halim  Subjective   Breathing is better today. She still has a pain over her left lateral chest wall.   Inpatient Medications    Scheduled Meds:  aspirin EC  81 mg Oral Daily   atorvastatin  80 mg Oral QHS   Chlorhexidine Gluconate Cloth  6 each Topical Daily   FLUoxetine  80 mg Oral BID   furosemide  80 mg Intravenous Q8H   gabapentin  600 mg Oral TID   heparin  5,000 Units Subcutaneous Q8H   insulin aspart  0-15 Units Subcutaneous TID WC   sodium chloride flush  3 mL Intravenous Q12H   traZODone  50 mg Oral QHS   Continuous Infusions:  nitroGLYCERIN 35 mcg/min (03/06/23 1900)   PRN Meds: acetaminophen, morphine injection, nitroGLYCERIN, ondansetron (ZOFRAN) IV, mouth rinse, oxyCODONE, sodium chloride flush   Vital Signs    Vitals:   03/07/23 0700 03/07/23 0715 03/07/23 0730 03/07/23 0745  BP:    96/83  Pulse: (!) 111 (!) 107 (!) 111 (!) 108  Resp: (!) 24 (!) 31 (!) 21 20  Temp: 98.1 F (36.7 C)     TempSrc: Oral     SpO2: (!) 89% 91% (!) 87% 90%  Weight:      Height:        Intake/Output Summary (Last 24 hours) at 03/07/2023 0801 Last data filed at 03/07/2023 0000 Gross per 24 hour  Intake 125 ml  Output 1950 ml  Net -1825 ml      03/07/2023    5:00 AM 03/06/2023    8:05 AM 12/03/2022    1:00 PM  Last 3 Weights  Weight (lbs) 185 lb 3 oz 189 lb 187 lb 6.4 oz  Weight (kg) 84 kg 85.73 kg 85.004 kg      Telemetry    Sinus - Personally Reviewed  ECG    Sinus tachycardia, LVH, non-specific ST and T wave abn - Personally Reviewed  Physical Exam   GEN: No acute distress.  Appears comfortable Neck: +JVD Cardiac: Tachy, soft systolic murmur Respiratory: Clear to auscultation bilaterally. GI: Soft, nontender, non-distended  MS: No LE edema Neuro:  Nonfocal  Psych: Normal affect   Labs    High  Sensitivity Troponin:   Recent Labs  Lab 03/06/23 0814 03/06/23 1013  TROPONINIHS 8,553* 10,249*     Chemistry Recent Labs  Lab 03/06/23 0814 03/06/23 1743 03/06/23 1748 03/07/23 0258  NA 133* 137  138 140 135  K 4.3 3.8  3.9 3.7 4.4  CL 100  --   --  100  CO2 21*  --   --  27  GLUCOSE 212*  --   --  195*  BUN 29*  --   --  27*  CREATININE 0.96  --   --  1.03*  CALCIUM 8.5*  --   --  8.9  PROT  --   --   --  6.9  ALBUMIN  --   --   --  3.3*  AST  --   --   --  80*  ALT  --   --   --  59*  ALKPHOS  --   --   --  72  BILITOT  --   --   --  1.0  GFRNONAA >60  --   --  >60  ANIONGAP 12  --   --  8    Lipids  Recent Labs  Lab 03/07/23 0258  CHOL 113  TRIG 91  HDL 57  LDLCALC 38  CHOLHDL 2.0    Hematology Recent Labs  Lab 03/06/23 0814 03/06/23 1743 03/06/23 1748 03/07/23 0258  WBC 11.0*  --   --  10.2  RBC 3.51*  --   --  3.44*  HGB 10.5* 10.2*  10.5* 9.9* 10.0*  HCT 32.1* 30.0*  31.0* 29.0* 30.4*  MCV 91.5  --   --  88.4  MCH 29.9  --   --  29.1  MCHC 32.7  --   --  32.9  RDW 13.2  --   --  13.0  PLT 187  --   --  209   Thyroid No results for input(s): "TSH", "FREET4" in the last 168 hours.  BNP Recent Labs  Lab 03/06/23 0814  BNP 2,236.0*    DDimer  Recent Labs  Lab 03/06/23 0814  DDIMER 0.77*     Radiology    CARDIAC CATHETERIZATION  Addendum Date: 03/06/2023     Mid LM to Dist LM lesion is 95% stenosed.   Ost LAD to Prox LAD lesion is 100% stenosed.   Ost Cx to Prox Cx lesion is 100% stenosed.   1st Mrg lesion is 80% stenosed.   Prox RCA to Mid RCA lesion is 30% stenosed.   LIMA graft was visualized by non-selective angiography and is large.   SVG graft was visualized by angiography and is large.   The graft exhibits no disease.   The graft exhibits no disease.   LV end diastolic pressure is severely elevated.   Hemodynamic findings consistent with severe pulmonary hypertension. Critical left main coronary stenosis Patent LIMA to the  LAD, no significant subclavian gradient by pullback. Patent SVG to OM. Ramus intermediate was not grafted. RCA is without significant disease Severely elevated LV filling pressures. LVEDP 42 mm Hg. PCWP 36/43 mean 33 mm Hg. Severe elevated pulmonary pressures. PAP 65/33 mean 47 mm Hg. RA pressure elevated with mean 23 mm Hg Cardiac output 4.39 L/min, index 2.33 Plan: patient has advanced heart failure. Lactate is normal. She is normotensive and not acidotic. Needs aggressive diuresis and optimization of CHF therapy. Will ask Advanced heart failure team to see.   Result Date: 03/06/2023 See surgical note for result.  ECHOCARDIOGRAM COMPLETE  Result Date: 03/06/2023    ECHOCARDIOGRAM REPORT   Patient Name:   Sonia Morales Date of Exam: 03/06/2023 Medical Rec #:  [id number]      Height:       63.0 in Accession #:    [id number]     Weight:       189.0 lb Date of Birth:  01/22/1956       BSA:          1.888 m Patient Age:    67 years       BP:           113/71 mmHg Patient Gender: F              HR:           104 bpm. [facility] Procedure: 2D Echo, Cardiac Doppler, Color Doppler and Intracardiac            Opacification Agent Indications:    NSTEMI l21.4  History:        Patient has prior history of Echocardiogram examinations, most  recent 01/01/2021. CHF, CAD and Previous Myocardial Infarction;                 Risk Factors:Diabetes, Dyslipidemia and Former Smoker.  Sonographer:    Celesta Gentile RCS Referring Phys: [id number] Ellsworth Lennox IMPRESSIONS  1. Left ventricular ejection fraction, by estimation, is 20 to 25%. The left ventricle has severely decreased function. The left ventricle demonstrates regional wall motion abnormalities (see scoring diagram/findings for description). There is mild asymmetric left ventricular hypertrophy of the lateral segment. Indeterminate diastolic filling due to E-A fusion.  2. Right ventricular systolic function is severely reduced. The right  ventricular size is normal. There is normal pulmonary artery systolic pressure.  3. Left atrial size was moderately dilated.  4. The mitral valve is normal in structure. Mild mitral valve regurgitation. No evidence of mitral stenosis.  5. The aortic valve has an indeterminant number of cusps. Aortic valve regurgitation is not visualized. No aortic stenosis is present.  6. The inferior vena cava is dilated in size with <50% respiratory variability, suggesting right atrial pressure of 15 mmHg. Comparison(s): Changes from prior study are noted. LVEF and RV systolic function worsened. FINDINGS  Left Ventricle: Left ventricular ejection fraction, by estimation, is 20 to 25%. The left ventricle has severely decreased function. The left ventricle demonstrates regional wall motion abnormalities. Definity contrast agent was given IV to delineate the left ventricular endocardial borders. The left ventricular internal cavity size was normal in size. There is mild asymmetric left ventricular hypertrophy of the lateral segment. Indeterminate diastolic filling due to E-A fusion.  LV Wall Scoring: The entire septum and apex are akinetic. The entire anterior wall, entire lateral wall, and entire inferior wall are hypokinetic. Right Ventricle: The right ventricular size is normal. No increase in right ventricular wall thickness. Right ventricular systolic function is severely reduced. There is normal pulmonary artery systolic pressure. The tricuspid regurgitant velocity is 2.05 m/s, and with an assumed right atrial pressure of 15 mmHg, the estimated right ventricular systolic pressure is 31.8 mmHg. Left Atrium: Left atrial size was moderately dilated. Right Atrium: Right atrial size was normal in size. Pericardium: There is no evidence of pericardial effusion. Mitral Valve: The mitral valve is normal in structure. Mild mitral valve regurgitation. No evidence of mitral valve stenosis. Tricuspid Valve: The tricuspid valve is normal in  structure. Tricuspid valve regurgitation is mild . No evidence of tricuspid stenosis. Aortic Valve: The aortic valve has an indeterminant number of cusps. Aortic valve regurgitation is not visualized. No aortic stenosis is present. Pulmonic Valve: The pulmonic valve was not well visualized. Pulmonic valve regurgitation is trivial. No evidence of pulmonic stenosis. Aorta: The aortic root is normal in size and structure. Venous: The inferior vena cava is dilated in size with less than 50% respiratory variability, suggesting right atrial pressure of 15 mmHg. IAS/Shunts: No atrial level shunt detected by color flow Doppler.  LEFT VENTRICLE PLAX 2D LVIDd:         5.30 cm LVIDs:         4.60 cm LV PW:         1.20 cm LV IVS:        0.70 cm LVOT diam:     2.00 cm LV SV:         32 LV SV Index:   17 LVOT Area:     3.14 cm  LV Volumes (MOD) LV vol d, MOD A2C: 150.0 ml LV vol d, MOD A4C: 128.0 ml LV vol  s, MOD A2C: 111.0 ml LV vol s, MOD A4C: 84.0 ml LV SV MOD A2C:     39.0 ml LV SV MOD A4C:     128.0 ml LV SV MOD BP:      43.9 ml RIGHT VENTRICLE RV S prime:     6.85 cm/s TAPSE (M-mode): 1.4 cm LEFT ATRIUM             Index        RIGHT ATRIUM           Index LA diam:        4.20 cm 2.22 cm/m   RA Area:     18.10 cm LA Vol (A2C):   76.5 ml 40.53 ml/m  RA Volume:   49.40 ml  26.17 ml/m LA Vol (A4C):   77.4 ml 41.00 ml/m LA Biplane Vol: 80.2 ml 42.49 ml/m  AORTIC VALVE LVOT Vmax:   56.40 cm/s LVOT Vmean:  38.600 cm/s LVOT VTI:    0.103 m  AORTA Ao Root diam: 3.30 cm MITRAL VALVE                  TRICUSPID VALVE MV Area (PHT): 5.38 cm       TR Peak grad:   16.8 mmHg MV Decel Time: 141 msec       TR Vmax:        205.00 cm/s MR Peak grad:    100.0 mmHg MR Mean grad:    61.0 mmHg    SHUNTS MR Vmax:         500.00 cm/s  Systemic VTI:  0.10 m MR Vmean:        366.0 cm/s   Systemic Diam: 2.00 cm MR PISA:         1.01 cm MR PISA Eff ROA: 5 mm MR PISA Radius:  0.40 cm MV E velocity: 130.00 cm/s MV A velocity: 82.00 cm/s MV E/A  ratio:  1.59 Vishnu Priya Mallipeddi Electronically signed by Winfield Rast Mallipeddi Signature Date/Time: 03/06/2023/2:56:51 PM    Final    DG Chest Portable 1 View  Result Date: 03/06/2023 CLINICAL DATA:  67 year old female with chest pain, shortness of breath since yesterday. EXAM: PORTABLE CHEST 1 VIEW COMPARISON:  Chest radiographs 02/23/2021. FINDINGS: Portable AP semi upright view at 0818 hours. Chronic CABG. Mild cardiomegaly. Other mediastinal contours are within normal limits. Coarse increased bilateral pulmonary interstitial opacity, slightly asymmetric and greater on the left. No pneumothorax, consolidation. No pleural effusion identified. Previous anterior and posterior cervical spine fusion partially visible. No acute osseous abnormality identified. Paucity of bowel gas the visible abdomen. IMPRESSION: Chronic CABG, mild cardiomegaly with diffuse increased pulmonary interstitial opacity favored to be acute pulmonary edema. Viral/atypical respiratory infection felt less likely. Electronically Signed   By: Odessa Fleming M.D.   On: 03/06/2023 09:32    Cardiac Studies    Patient Profile     67 y.o. female with history of CAD s/p 2V CABG in 2022, ischemic cardiomyopathy, chronic systolic CHF, prior tobacco abuse, HLD, DM who was admitted to [facility] 03/06/23 with chest pain and acute CHF and transferred to [facility] for urgent cardiac cath. Cardiac cath with severe left main stenosis, patent LIMA to LAD, patent SVG to OM, minimal disease RCA. No focal targets for PCI. Elevated right and left heart pressures. Pt admitted to ICU. Echo with severe LV and RV systolic dysfunction with LVEF=20%.   Assessment & Plan    Acute on chronic systolic CHF/ischemic cardiomyopathy:  She has diuresed well overnight. Renal function stable. She is warm and does not have peripheral edema. Will continue IV NTG today and Lasix 80 mg IV TID. Advanced Heart Failure team to see today to assist in management of  biventricular failure.   CAD/NSTEMI: No focal targets for PCI during cath 03/06/23. Critical left main stenosis. The LAD and OM fill from the patent bypass grafts. There is a small to moderate caliber intermediate branch that is not grafted but given small vessel size I do not think this PCI of the left main leading into this branch would give her any significant benefit. Given presentation with ACS, will continue ASA and will load with Plavix today.  Continue statin.   DM: Will resume home insulin regimen. SSI.   For questions or updates, please contact [facility] Please consult [email or web address] for contact info under     Signed, Verne Carrow, MD  03/07/2023, 8:01 AM

## 2023-03-08 ENCOUNTER — Other Ambulatory Visit: Payer: Self-pay

## 2023-03-08 ENCOUNTER — Inpatient Hospital Stay (HOSPITAL_COMMUNITY): Payer: Medicare Other

## 2023-03-08 DIAGNOSIS — I214 Non-ST elevation (NSTEMI) myocardial infarction: Secondary | ICD-10-CM | POA: Diagnosis not present

## 2023-03-08 LAB — GLUCOSE, CAPILLARY
Glucose-Capillary: 143 mg/dL — ABNORMAL HIGH (ref 70–99)
Glucose-Capillary: 251 mg/dL — ABNORMAL HIGH (ref 70–99)
Glucose-Capillary: 109 mg/dL — ABNORMAL HIGH (ref 70–99)
Glucose-Capillary: 184 mg/dL — ABNORMAL HIGH (ref 70–99)

## 2023-03-08 LAB — BASIC METABOLIC PANEL
Anion gap: 19 — ABNORMAL HIGH (ref 5–15)
BUN: 44 mg/dL — ABNORMAL HIGH (ref 8–23)
CO2: 25 mmol/L (ref 22–32)
Calcium: 9.2 mg/dL (ref 8.9–10.3)
Chloride: 95 mmol/L — ABNORMAL LOW (ref 98–111)
Creatinine, Ser: 1.59 mg/dL — ABNORMAL HIGH (ref 0.44–1.00)
GFR, Estimated: 36 mL/min — ABNORMAL LOW (ref >60)
Glucose, Bld: 100 mg/dL — ABNORMAL HIGH (ref 70–99)
Potassium: 4 mmol/L (ref 3.5–5.1)
Sodium: 139 mmol/L (ref 135–145)

## 2023-03-08 LAB — COOXEMETRY PANEL
Carboxyhemoglobin: 1.4 % (ref 0.5–1.5)
Methemoglobin: <0.7 % (ref 0.0–1.5)
O2 Saturation: 53.5 %
Total hemoglobin: 11.8 g/dL — ABNORMAL LOW (ref 12.0–16.0)

## 2023-03-08 LAB — COMPREHENSIVE METABOLIC PANEL
ALT: 56 U/L — ABNORMAL HIGH (ref 0–44)
AST: 51 U/L — ABNORMAL HIGH (ref 15–41)
Albumin: 3.3 g/dL — ABNORMAL LOW (ref 3.5–5.0)
Alkaline Phosphatase: 68 U/L (ref 38–126)
Anion gap: 12 (ref 5–15)
BUN: 40 mg/dL — ABNORMAL HIGH (ref 8–23)
CO2: 29 mmol/L (ref 22–32)
Calcium: 9 mg/dL (ref 8.9–10.3)
Chloride: 95 mmol/L — ABNORMAL LOW (ref 98–111)
Creatinine, Ser: 1.36 mg/dL — ABNORMAL HIGH (ref 0.44–1.00)
GFR, Estimated: 43 mL/min — ABNORMAL LOW (ref >60)
Glucose, Bld: 128 mg/dL — ABNORMAL HIGH (ref 70–99)
Potassium: 4 mmol/L (ref 3.5–5.1)
Sodium: 136 mmol/L (ref 135–145)
Total Bilirubin: 0.7 mg/dL (ref <1.2)
Total Protein: 7 g/dL (ref 6.5–8.1)

## 2023-03-08 LAB — CBC
HCT: 34.1 % — ABNORMAL LOW (ref 36.0–46.0)
Hemoglobin: 11 g/dL — ABNORMAL LOW (ref 12.0–15.0)
MCH: 28.9 pg (ref 26.0–34.0)
MCHC: 32.3 g/dL (ref 30.0–36.0)
MCV: 89.5 fL (ref 80.0–100.0)
Platelets: 215 10*3/uL (ref 150–400)
RBC: 3.81 MIL/uL — ABNORMAL LOW (ref 3.87–5.11)
RDW: 12.7 % (ref 11.5–15.5)
WBC: 7.4 10*3/uL (ref 4.0–10.5)
nRBC: 0 % (ref 0.0–0.2)

## 2023-03-08 LAB — MAGNESIUM: Magnesium: 1.9 mg/dL (ref 1.7–2.4)

## 2023-03-08 LAB — CG4 I-STAT (LACTIC ACID): Lactic Acid, Venous: 0.9 mmol/L (ref 0.5–1.9)

## 2023-03-08 MED ORDER — SODIUM CHLORIDE 0.9% FLUSH: 10.0000 mL | INTRAVENOUS | Status: DC | PRN

## 2023-03-08 MED ORDER — GABAPENTIN 300 MG PO CAPS
600.0000 mg | ORAL_CAPSULE | Freq: Every day | ORAL | Status: DC
  Administered 2023-03-09 – 2023-03-12 (×4): 600 mg via ORAL
  Filled 2023-03-08 (×4): qty 2

## 2023-03-08 MED ORDER — FUROSEMIDE 10 MG/ML IJ SOLN
120.0000 mg | Freq: Once | INTRAMUSCULAR | Status: AC
  Administered 2023-03-08: 120 mg via INTRAVENOUS
  Filled 2023-03-08: qty 10

## 2023-03-08 MED ORDER — MAGNESIUM SULFATE 2 GM/50ML IV SOLN
2.0000 g | Freq: Once | INTRAVENOUS | Status: AC
  Administered 2023-03-08: 2 g via INTRAVENOUS
  Filled 2023-03-08: qty 50

## 2023-03-08 MED ORDER — SODIUM CHLORIDE 0.9% FLUSH
10.0000 mL | Freq: Two times a day (BID) | INTRAVENOUS | Status: DC
  Administered 2023-03-08 – 2023-03-12 (×6): 10 mL

## 2023-03-08 MED ORDER — FUROSEMIDE 10 MG/ML IJ SOLN
15.0000 mg/h | INTRAVENOUS | Status: DC
  Administered 2023-03-08 – 2023-03-09 (×3): 15 mg/h via INTRAVENOUS
  Filled 2023-03-08 (×3): qty 20

## 2023-03-08 MED ORDER — POTASSIUM CHLORIDE CRYS ER 20 MEQ PO TBCR
40.0000 meq | EXTENDED_RELEASE_TABLET | Freq: Once | ORAL | Status: AC
  Administered 2023-03-08: 40 meq via ORAL
  Filled 2023-03-08: qty 2

## 2023-03-08 MED ORDER — IOHEXOL 350 MG/ML SOLN
75.0000 mL | Freq: Once | INTRAVENOUS | Status: AC | PRN
  Administered 2023-03-08: 75 mL via INTRAVENOUS

## 2023-03-08 MED ORDER — INSULIN ASPART 100 UNIT/ML IJ SOLN
3.0000 [IU] | Freq: Three times a day (TID) | INTRAMUSCULAR | Status: DC
  Administered 2023-03-09 – 2023-03-13 (×14): 3 [IU] via SUBCUTANEOUS

## 2023-03-08 NOTE — Progress Notes (Signed)
Peripherally Inserted Central Catheter Placement  The IV Nurse has discussed with the patient and/or persons authorized to consent for the patient, the purpose of this procedure and the potential benefits and risks involved with this procedure.  The benefits include less needle sticks, lab draws from the catheter, and the patient may be discharged home with the catheter. Risks include, but not limited to, infection, bleeding, blood clot (thrombus formation), and puncture of an artery; nerve damage and irregular heartbeat and possibility to perform a PICC exchange if needed/ordered by physician.  Alternatives to this procedure were also discussed.  Bard Power PICC patient education guide, fact sheet on infection prevention and patient information card has been provided to patient /or left at bedside.    PICC Placement Documentation  PICC Triple Lumen 03/08/23 Right Brachial 35 cm 0 cm (Active)  Indication for Insertion or Continuance of Line Vasoactive infusions 03/08/23 1102  Exposed Catheter (cm) 0 cm 03/08/23 1102  Site Assessment Clean, Dry, Intact 03/08/23 1102  Lumen #1 Status Flushed;Saline locked;Blood return noted 03/08/23 1102  Lumen #2 Status Flushed;Saline locked;Blood return noted 03/08/23 1102  Lumen #3 Status Blood return noted 03/08/23 1102  Dressing Type Transparent;Securing device 03/08/23 1102  Dressing Status Clean, Dry, Intact;Antimicrobial disc in place 03/08/23 1102  Line Care Connections checked and tightened 03/08/23 1102  Line Adjustment (NICU/IV Team Only) No 03/08/23 1102  Dressing Intervention New dressing;Adhesive placed at insertion site (IV team only) 03/08/23 1102  Dressing Change Due 03/15/23 03/08/23 1102       Reginia Forts Albarece 03/08/2023, 11:17 AM

## 2023-03-08 NOTE — Plan of Care (Signed)
Problem: Education: Goal: Knowledge of General Education information will improve Description: Including pain rating scale, medication(s)/side effects and non-pharmacologic comfort measures Outcome: Progressing   Problem: Health Behavior/Discharge Planning: Goal: Ability to manage health-related needs will improve Outcome: Progressing   Problem: Clinical Measurements: Goal: Ability to maintain clinical measurements within normal limits will improve Outcome: Progressing Goal: Will remain free from infection Outcome: Progressing Goal: Diagnostic test results will improve Outcome: Progressing Goal: Respiratory complications will improve Outcome: Progressing Goal: Cardiovascular complication will be avoided Outcome: Progressing   Problem: Activity: Goal: Risk for activity intolerance will decrease Outcome: Progressing   Problem: Nutrition: Goal: Adequate nutrition will be maintained Outcome: Progressing   Problem: Coping: Goal: Level of anxiety will decrease Outcome: Progressing   Problem: Elimination: Goal: Will not experience complications related to bowel motility Outcome: Progressing Goal: Will not experience complications related to urinary retention Outcome: Progressing   Problem: Pain Management: Goal: General experience of comfort will improve Outcome: Progressing   Problem: Safety: Goal: Ability to remain free from injury will improve Outcome: Progressing   Problem: Skin Integrity: Goal: Risk for impaired skin integrity will decrease Outcome: Progressing   Problem: Education: Goal: Ability to describe self-care measures that may prevent or decrease complications (Diabetes Survival Skills Education) will improve Outcome: Progressing Goal: Individualized Educational Video(s) Outcome: Progressing   Problem: Coping: Goal: Ability to adjust to condition or change in health will improve Outcome: Progressing   Problem: Fluid Volume: Goal: Ability to  maintain a balanced intake and output will improve Outcome: Progressing   Problem: Health Behavior/Discharge Planning: Goal: Ability to identify and utilize available resources and services will improve Outcome: Progressing Goal: Ability to manage health-related needs will improve Outcome: Progressing   Problem: Metabolic: Goal: Ability to maintain appropriate glucose levels will improve Outcome: Progressing   Problem: Nutritional: Goal: Maintenance of adequate nutrition will improve Outcome: Progressing Goal: Progress toward achieving an optimal weight will improve Outcome: Progressing   Problem: Skin Integrity: Goal: Risk for impaired skin integrity will decrease Outcome: Progressing   Problem: Tissue Perfusion: Goal: Adequacy of tissue perfusion will improve Outcome: Progressing   Problem: Education: Goal: Understanding of cardiac disease, CV risk reduction, and recovery process will improve Outcome: Progressing Goal: Individualized Educational Video(s) Outcome: Progressing   Problem: Activity: Goal: Ability to tolerate increased activity will improve Outcome: Progressing   Problem: Cardiac: Goal: Ability to achieve and maintain adequate cardiovascular perfusion will improve Outcome: Progressing   Problem: Health Behavior/Discharge Planning: Goal: Ability to safely manage health-related needs after discharge will improve Outcome: Progressing   Problem: Education: Goal: Understanding of CV disease, CV risk reduction, and recovery process will improve Outcome: Progressing Goal: Individualized Educational Video(s) Outcome: Progressing   Problem: Activity: Goal: Ability to return to baseline activity level will improve Outcome: Progressing   Problem: Cardiovascular: Goal: Ability to achieve and maintain adequate cardiovascular perfusion will improve Outcome: Progressing Goal: Vascular access site(s) Level 0-1 will be maintained Outcome: Progressing    Problem: Health Behavior/Discharge Planning: Goal: Ability to safely manage health-related needs after discharge will improve Outcome: Progressing   Problem: Education: Goal: Knowledge of General Education information will improve Description: Including pain rating scale, medication(s)/side effects and non-pharmacologic comfort measures Outcome: Progressing   Problem: Health Behavior/Discharge Planning: Goal: Ability to manage health-related needs will improve Outcome: Progressing   Problem: Clinical Measurements: Goal: Ability to maintain clinical measurements within normal limits will improve Outcome: Progressing Goal: Will remain free from infection Outcome: Progressing Goal: Diagnostic test results will improve Outcome: Progressing Goal:  Respiratory complications will improve Outcome: Progressing Goal: Cardiovascular complication will be avoided Outcome: Progressing   Problem: Activity: Goal: Risk for activity intolerance will decrease Outcome: Progressing   Problem: Nutrition: Goal: Adequate nutrition will be maintained Outcome: Progressing   Problem: Coping: Goal: Level of anxiety will decrease Outcome: Progressing   Problem: Elimination: Goal: Will not experience complications related to bowel motility Outcome: Progressing Goal: Will not experience complications related to urinary retention Outcome: Progressing   Problem: Pain Management: Goal: General experience of comfort will improve Outcome: Progressing   Problem: Safety: Goal: Ability to remain free from injury will improve Outcome: Progressing   Problem: Skin Integrity: Goal: Risk for impaired skin integrity will decrease Outcome: Progressing   Problem: Education: Goal: Ability to describe self-care measures that may prevent or decrease complications (Diabetes Survival Skills Education) will improve Outcome: Progressing Goal: Individualized Educational Video(s) Outcome: Progressing    Problem: Coping: Goal: Ability to adjust to condition or change in health will improve Outcome: Progressing   Problem: Fluid Volume: Goal: Ability to maintain a balanced intake and output will improve Outcome: Progressing   Problem: Health Behavior/Discharge Planning: Goal: Ability to identify and utilize available resources and services will improve Outcome: Progressing Goal: Ability to manage health-related needs will improve Outcome: Progressing   Problem: Metabolic: Goal: Ability to maintain appropriate glucose levels will improve Outcome: Progressing   Problem: Nutritional: Goal: Maintenance of adequate nutrition will improve Outcome: Progressing Goal: Progress toward achieving an optimal weight will improve Outcome: Progressing   Problem: Skin Integrity: Goal: Risk for impaired skin integrity will decrease Outcome: Progressing   Problem: Tissue Perfusion: Goal: Adequacy of tissue perfusion will improve Outcome: Progressing   Problem: Education: Goal: Understanding of cardiac disease, CV risk reduction, and recovery process will improve Outcome: Progressing Goal: Individualized Educational Video(s) Outcome: Progressing   Problem: Activity: Goal: Ability to tolerate increased activity will improve Outcome: Progressing   Problem: Cardiac: Goal: Ability to achieve and maintain adequate cardiovascular perfusion will improve Outcome: Progressing   Problem: Health Behavior/Discharge Planning: Goal: Ability to safely manage health-related needs after discharge will improve Outcome: Progressing   Problem: Education: Goal: Understanding of CV disease, CV risk reduction, and recovery process will improve Outcome: Progressing Goal: Individualized Educational Video(s) Outcome: Progressing   Problem: Activity: Goal: Ability to return to baseline activity level will improve Outcome: Progressing   Problem: Cardiovascular: Goal: Ability to achieve and maintain  adequate cardiovascular perfusion will improve Outcome: Progressing Goal: Vascular access site(s) Level 0-1 will be maintained Outcome: Progressing   Problem: Health Behavior/Discharge Planning: Goal: Ability to safely manage health-related needs after discharge will improve Outcome: Progressing   Problem: Education: Goal: Knowledge of General Education information will improve Description: Including pain rating scale, medication(s)/side effects and non-pharmacologic comfort measures Outcome: Progressing   Problem: Health Behavior/Discharge Planning: Goal: Ability to manage health-related needs will improve Outcome: Progressing   Problem: Clinical Measurements: Goal: Ability to maintain clinical measurements within normal limits will improve Outcome: Progressing Goal: Will remain free from infection Outcome: Progressing Goal: Diagnostic test results will improve Outcome: Progressing Goal: Respiratory complications will improve Outcome: Progressing Goal: Cardiovascular complication will be avoided Outcome: Progressing   Problem: Activity: Goal: Risk for activity intolerance will decrease Outcome: Progressing   Problem: Nutrition: Goal: Adequate nutrition will be maintained Outcome: Progressing   Problem: Coping: Goal: Level of anxiety will decrease Outcome: Progressing   Problem: Elimination: Goal: Will not experience complications related to bowel motility Outcome: Progressing Goal: Will not experience complications related to urinary  retention Outcome: Progressing   Problem: Pain Management: Goal: General experience of comfort will improve Outcome: Progressing   Problem: Safety: Goal: Ability to remain free from injury will improve Outcome: Progressing   Problem: Skin Integrity: Goal: Risk for impaired skin integrity will decrease Outcome: Progressing   Problem: Education: Goal: Ability to describe self-care measures that may prevent or decrease  complications (Diabetes Survival Skills Education) will improve Outcome: Progressing Goal: Individualized Educational Video(s) Outcome: Progressing   Problem: Coping: Goal: Ability to adjust to condition or change in health will improve Outcome: Progressing   Problem: Fluid Volume: Goal: Ability to maintain a balanced intake and output will improve Outcome: Progressing   Problem: Health Behavior/Discharge Planning: Goal: Ability to identify and utilize available resources and services will improve Outcome: Progressing Goal: Ability to manage health-related needs will improve Outcome: Progressing   Problem: Metabolic: Goal: Ability to maintain appropriate glucose levels will improve Outcome: Progressing   Problem: Nutritional: Goal: Maintenance of adequate nutrition will improve Outcome: Progressing Goal: Progress toward achieving an optimal weight will improve Outcome: Progressing   Problem: Skin Integrity: Goal: Risk for impaired skin integrity will decrease Outcome: Progressing   Problem: Tissue Perfusion: Goal: Adequacy of tissue perfusion will improve Outcome: Progressing   Problem: Education: Goal: Understanding of cardiac disease, CV risk reduction, and recovery process will improve Outcome: Progressing Goal: Individualized Educational Video(s) Outcome: Progressing   Problem: Activity: Goal: Ability to tolerate increased activity will improve Outcome: Progressing   Problem: Cardiac: Goal: Ability to achieve and maintain adequate cardiovascular perfusion will improve Outcome: Progressing   Problem: Health Behavior/Discharge Planning: Goal: Ability to safely manage health-related needs after discharge will improve Outcome: Progressing   Problem: Education: Goal: Understanding of CV disease, CV risk reduction, and recovery process will improve Outcome: Progressing Goal: Individualized Educational Video(s) Outcome: Progressing   Problem: Activity: Goal:  Ability to return to baseline activity level will improve Outcome: Progressing   Problem: Cardiovascular: Goal: Ability to achieve and maintain adequate cardiovascular perfusion will improve Outcome: Progressing Goal: Vascular access site(s) Level 0-1 will be maintained Outcome: Progressing   Problem: Health Behavior/Discharge Planning: Goal: Ability to safely manage health-related needs after discharge will improve Outcome: Progressing

## 2023-03-08 NOTE — TOC Initial Note (Signed)
Transition of Care [facility]) - Initial/Assessment Note    Patient Details  Name: Sonia Morales MRN: [id number] Date of Birth: August 08, 1955  Transition of [facility]) CM/SW Contact:    Elliot Cousin, RN Phone Number: [phone] 03/08/2023, 5:36 PM  Clinical Narrative:     CM spoke to pt and husband at bedside. Offered choice for [facility], agreeable to [facility] for [facility]. Will notify [employer], [place]. (Medicare.gov list placed on chart) Pt will need oxygen at dc.     Husband at home to assist with care.            Expected Discharge Plan: Home w Home Health Services Barriers to Discharge: Continued Medical Work up   Patient Goals and CMS Choice Patient states their goals for this hospitalization and ongoing recovery are:: wants to recover CMS Medicare.gov Compare Post Acute Care list provided to:: Patient Choice offered to / list presented to : Patient      Expected Discharge Plan and Services   Discharge Planning Services: CM Consult Post Acute Care Choice: Home Health Living arrangements for the past 2 months: Single Family Home                             HH Agency: [facility] Date [facility] Agency Contacted: 03/08/23 Time HH Agency Contacted: [id number] Representative spoke with at [facility] Agency: Kandee Keen  Prior Living Arrangements/Services Living arrangements for the past 2 months: Single Family Home Lives with:: Spouse Patient language and need for interpreter reviewed:: Yes Do you feel safe going back to the place where you live?: Yes      Need for Family Participation in Patient Care: Yes (Comment) Care giver support system in place?: Yes (comment)   Criminal Activity/Legal Involvement Pertinent to Current Situation/Hospitalization: No - Comment as needed  Activities of Daily Living   ADL Screening (condition at time of admission) Independently performs ADLs?: Yes (appropriate for developmental age) Is the patient deaf or have difficulty hearing?: No Does the patient  have difficulty seeing, even when wearing glasses/contacts?: No Does the patient have difficulty concentrating, remembering, or making decisions?: No  Permission Sought/Granted Permission sought to share information with : Case Manager, PCP, Family Supports Permission granted to share information with : Yes, Verbal Permission Granted  Share Information with NAME: Mckyla Heidinger  Permission granted to share info w AGENCY: Home Health, DME  Permission granted to share info w Relationship: husband  Permission granted to share info w Contact Information: [phone]  Emotional Assessment Appearance:: Appears stated age Attitude/Demeanor/Rapport: Engaged Affect (typically observed): Accepting Orientation: : Oriented to Self, Oriented to Place, Oriented to  Time, Oriented to Situation   Psych Involvement: No (comment)  Admission diagnosis:  NSTEMI (non-ST elevated myocardial infarction) (HCC) [I21.4] Acute systolic CHF (congestive heart failure), NYHA class 4 (HCC) [I50.21] Patient Active Problem List   Diagnosis Date Noted   Acute systolic CHF (congestive heart failure), NYHA class 4 (HCC) 03/07/2023   Non-ST elevation (NSTEMI) myocardial infarction (HCC) 03/06/2023   Acute hypoxic respiratory failure (HCC) 03/06/2023   Ischemic cardiomyopathy 03/06/2023   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   Cardiogenic shock (HCC)    Unstable angina (HCC)    Acute on chronic systolic heart failure (HCC) 12/31/2020   Acute on chronic combined systolic and diastolic CHF (congestive heart failure) (HCC) 12/31/2020   Dyspnea on exertion 01/07/2019   Abnormal nuclear stress test 01/07/2019   Type 2 diabetes mellitus  with complication, with long-term current use of insulin (HCC) 06/12/2018   Hypercholesteremia 06/12/2018   H/O tobacco use, presenting hazards to health 06/12/2018   CAD (coronary artery disease), native coronary artery 10/11/2015   Acute myocardial infarction of anterior wall (HCC) 10/04/2015   PCP:   Richardean Chimera, MD Pharmacy:   [facility] - [place], [place] - [address] [place] [address] Phone: [phone] Fax: [phone]  [id number] - [place], [place] - [address] [place] [address] Phone: [phone] Fax: [phone]     Social Determinants of Health (SDOH) Social History: SDOH Screenings   Food Insecurity: No Food Insecurity (03/07/2023)  Housing: Low Risk  (03/07/2023)  Transportation Needs: No Transportation Needs (03/07/2023)  Utilities: Not At Risk (03/07/2023)  Alcohol Screen: Low Risk  (01/04/2021)  Depression (PHQ2-9): Medium Risk (03/23/2021)  Financial Resource Strain: Low Risk  (01/04/2021)  Tobacco Use: Medium Risk (03/07/2023)   SDOH Interventions: Food Insecurity Interventions: Intervention Not Indicated   Readmission Risk Interventions     No data to display

## 2023-03-08 NOTE — Progress Notes (Addendum)
Advanced Heart Failure Rounding Note  PCP-Cardiologist: None   Subjective:    Nitroglycerin gtt weaned off overnight.  Ongoing substernal L sided chest pain. Severe L flank pain. + UA. Urine culture pending. + CVA tenderness. No renal calculi noted on [place]. On abx. Lasix IV 160 BID. 3.3L UOP (-2.2 net negative). BP soft this am.  Slept well overnight. Appetite good. Pain 8/10 in L flank and substernal chest pain.   Objective:   Weight Range: 84.1 kg Body mass index is 32.84 kg/m.   Vital Signs:   Temp:  [97.8 F (36.6 C)-98.2 F (36.8 C)] 97.9 F (36.6 C) (11/22 0518) Pulse Rate:  [86-111] 95 (11/22 0300) Resp:  [11-33] 22 (11/22 0300) BP: (67-126)/(20-90) 91/57 (11/22 0300) SpO2:  [85 %-96 %] 92 % (11/22 0300) Weight:  [84.1 kg] 84.1 kg (11/22 0500) Last BM Date : 03/05/23  Weight change: Filed Weights   03/06/23 0805 03/07/23 0500 03/08/23 0500  Weight: 85.7 kg 84 kg 84.1 kg    Intake/Output:   Intake/Output Summary (Last 24 hours) at 03/08/2023 0728 Last data filed at 03/08/2023 0600 Gross per 24 hour  Intake 1083.11 ml  Output 3300 ml  Net -2216.89 ml    Physical Exam    General: Chronically ill appearing. Requiring O2 supp HEENT: stiff Cardiac: JVP to mid neck. S1 and S2 present. No murmurs or rub. Resp: Lung sounds clear and equal B/L Abdomen: Soft, non-tender, non-distended. + BS. GU: Urine clear, CVA tenderness Extremities: Warm and dry. No rash, cyanosis.  No BLE edema.  Neuro: Alert and oriented x3. Affect pleasant. Moves all extremities without difficulty. Lines/Devices:  external catheter  Telemetry   SR in 80s (personally reviewed)  EKG    N/A  Labs    CBC Recent Labs    03/07/23 0258 03/08/23 0209  WBC 10.2 7.4  HGB 10.0* 11.0*  HCT 30.4* 34.1*  MCV 88.4 89.5  PLT 209 215   Basic Metabolic Panel Recent Labs    40/34/74 1324 03/08/23 0209  NA 134* 136  K 4.0 4.0  CL 97* 95*  CO2 25 29  GLUCOSE 249* 128*  BUN  35* 40*  CREATININE 1.55* 1.36*  CALCIUM 8.6* 9.0   Liver Function Tests Recent Labs    03/07/23 0258 03/08/23 0209  AST 80* 51*  ALT 59* 56*  ALKPHOS 72 68  BILITOT 1.0 0.7  PROT 6.9 7.0  ALBUMIN 3.3* 3.3*   No results for input(s): "LIPASE", "AMYLASE" in the last 72 hours. Cardiac Enzymes No results for input(s): "CKTOTAL", "CKMB", "CKMBINDEX", "TROPONINI" in the last 72 hours.  BNP: BNP (last 3 results) Recent Labs    03/06/23 0814  BNP 2,236.0*    ProBNP (last 3 results) No results for input(s): "PROBNP" in the last 8760 hours.   D-Dimer Recent Labs    03/06/23 0814  DDIMER 0.77*   Hemoglobin A1C Recent Labs    03/07/23 0258  HGBA1C 7.9*   Fasting Lipid Panel Recent Labs    03/07/23 0258  CHOL 113  HDL 57  LDLCALC 38  TRIG 91  CHOLHDL 2.0   Thyroid Function Tests No results for input(s): "TSH", "T4TOTAL", "T3FREE", "THYROIDAB" in the last 72 hours.  Invalid input(s): "FREET3"  Other results:   Imaging    US RENAL  Result Date: 03/07/2023 CLINICAL DATA:  67 year old female with back pain.  Nephrolithiasis. EXAM: RENAL / URINARY TRACT ULTRASOUND COMPLETE COMPARISON:  Noncontrast CT Abdomen and Pelvis 01/25/2023. FINDINGS: Right Kidney:  Renal measurements: 10.4 x 4.6 x 5.9 cm = volume: 148 mL. Echogenicity within normal limits. No mass or hydronephrosis visualized. Right nephrolithiasis and/or renal vascular calcifications last month not evident by ultrasound. Left Kidney: Renal measurements: 10.9 x 5.6 x 5.8 cm = volume: 186 mL. Echogenicity within normal limits. No mass or hydronephrosis visualized. Nephrolithiasis last month not evident by ultrasound. Bladder: Large bladder volume. Appears normal for degree of bladder distention. Other: None. IMPRESSION: Normal sonographic appearance of the kidneys and the urinary bladder. Bilateral nephrolithiasis on CT last month not evident by ultrasound. Electronically Signed   By: Odessa Fleming M.D.   On:  03/07/2023 12:33     Medications:     Scheduled Medications:  aspirin EC  81 mg Oral Daily   atorvastatin  80 mg Oral QHS   Chlorhexidine Gluconate Cloth  6 each Topical Daily   clopidogrel  75 mg Oral Daily   FLUoxetine  80 mg Oral BID   gabapentin  600 mg Oral TID   heparin  5,000 Units Subcutaneous Q8H   insulin aspart  0-15 Units Subcutaneous TID WC   insulin glargine-yfgn  15 Units Subcutaneous Daily   lidocaine  1 patch Transdermal Q24H   sodium chloride flush  3 mL Intravenous Q12H   spironolactone  12.5 mg Oral Daily   traZODone  50 mg Oral QHS    Infusions:  cefTRIAXone (ROCEPHIN)  IV Stopped (03/07/23 1839)   nitroGLYCERIN Stopped (03/07/23 1941)    PRN Medications: acetaminophen, morphine injection, nitroGLYCERIN, ondansetron (ZOFRAN) IV, mouth rinse, oxyCODONE, sodium chloride flush  Patient Profile   Sonia Morales is a 67 y.o. female with past medical history of HFrEF, CAD manifested by STEMI in 2017 s/p PCI to LAD, NSTEMI c/b cardiogenic shock s/p emergent CABG (LIMA to LAD, SVG to OM) in 2022, and type 2 diabetes.   Now admitted for NSTEMI.   Assessment/Plan   A/C HFrEF - ischemic cardiomyopathy - 3/23 Echo: 40-45% (improved from 35% pre-CABG in 9/22) GIDD, diffuse LV regional wall motion hypokinesis - Echo 11/24  EF 20-25%. Rv nl. Mild MR and TR. - RHC: severely elevated LVEDP 42, PCWP 33, PAP 65/33/47, RA 23, CO/CI 4.39/2.33 - Aggressive diuresis. Response to high dose Lasix bolus marginal. Lasix 120 IV bolus + drip. - Increase Spiro to 25 daily - Hold losartan for now given hypotension and bump in sCr - No SGLT2i with recurrent UTIs   2. CAD with NSTEMI - STEMI in 2017 s/p PCI to LAD, NSTEMI c/b cardiogenic shock s/p emergent CABG (LIMA to LAD, SVG to OM) in 2022 - LHC: critical left main coronary stenosis (mLM to dLM 95%, ost LAD to pLAD 100%, ostCx to pCx 100%), patent LIMA to LAD, patent SVG to OM - Substernal L sided chest pain 8/10 - Continue  Asprin - Continue Plavix  - Continue Lipitor - Nitroglycerin gtt now off. Softer BP overnight.   3. UTI - history of recurrent UTIs - 10/11 bilateral non-obstructing renal calculi noted on CT scan - Renal US normal without evidence of previous renal calculi - CVA tenderness on exam - Episode of retention yesterday, bladder scan with 500cc. Voided with attempt.  - + UA, culture pending - Continue to monitor I/O. PRN bladder scan and I/O  - No SGLT2i  - On Ceftriaxone   4. AKI: in the setting of volume overload and UTI - baseline sCr 0.8 - 1.03>1.55>1.36 today - diuresis as above - UTI treatment as above  5. DMII -  SSI per primary team  Length of Stay: 1  [place] Lee, NP  03/08/2023, 7:28 AM  Advanced Heart Failure Team Pager [phone] (M-F; 7a - 5p)  Please contact [facility] for night-coverage after hours (5p -7a ) and weekends on [email or web address]  Agree with above.   Continues with severe L flank pain. BP soft on IV NTG. UA c.w UTI. No obstruction on u/s.   Modest diuresis with high-dose lasix  General: Sitting up in bed. No resp difficulty HEENT: normal Neck: supple. JVP hard to see Carotids 2+ bilat; no bruits. No lymphadenopathy or thryomegaly appreciated. Cor: PMI nondisplaced. Regular rate & rhythm. No rubs, gallops or murmurs. Lungs: coarse Abdomen: obese soft, nontender, nondistended. No hepatosplenomegaly. No bruits or masses. Good bowel sounds. Extremities: no cyanosis, clubbing, rash, edema Neuro: alert & orientedx3, cranial nerves grossly intact. moves all 4 extremities w/o difficulty. Affect pleasant  Now with worsening flank pain. Unclear etiology. Remains volume overloaded.   Plan: - Check lactate - CT C/A/P - Place PICC - follow CVP and co-ox - Start lasix gtt - Keep in ICU for now.   CRITICAL CARE Performed by: Arvilla Meres  Total critical care time: 40 minutes  Critical care time was exclusive of separately billable procedures and  treating other patients.  Critical care was necessary to treat or prevent imminent or life-threatening deterioration.  Critical care was time spent personally by me (independent of midlevel providers or residents) on the following activities: development of treatment plan with patient and/or surrogate as well as nursing, discussions with consultants, evaluation of patient's response to treatment, examination of patient, obtaining history from patient or surrogate, ordering and performing treatments and interventions, ordering and review of laboratory studies, ordering and review of radiographic studies, pulse oximetry and re-evaluation of patient's condition.  Arvilla Meres, MD  9:08 AM

## 2023-03-08 NOTE — Inpatient Diabetes Management (Signed)
Inpatient Diabetes Program Recommendations  AACE/ADA: New Consensus Statement on Inpatient Glycemic Control (2015)  Target Ranges:  Prepandial:   less than 140 mg/dL      Peak postprandial:   less than 180 mg/dL (1-2 hours)      Critically ill patients:  140 - 180 mg/dL   Lab Results  Component Value Date   GLUCAP 143 (H) 03/08/2023   HGBA1C 7.9 (H) 03/07/2023    Review of Glycemic Control  Latest Reference Range & Units 03/07/23 07:07 03/07/23 11:51 03/07/23 16:51 03/07/23 21:36 03/08/23 05:57  Glucose-Capillary 70 - 99 mg/dL 623 (H) 762 (H) 831 (H) 269 (H) 143 (H)  (H): Data is abnormally high Diabetes history: Type 2 DM Outpatient Diabetes medications: Novolog 70/30 40 units BID Current orders for Inpatient glycemic control: Semglee 15 units every day, Novolog 0-15 units TID  Inpatient Diabetes Program Recommendations:    Consider adding Novolog 3 units TID (assuming patient consumes >50% of meals)  Thanks, Lujean Rave, MSN, RNC-OB Diabetes Coordinator [phone] (8a-5p)<

## 2023-03-09 DIAGNOSIS — I214 Non-ST elevation (NSTEMI) myocardial infarction: Secondary | ICD-10-CM | POA: Diagnosis not present

## 2023-03-09 LAB — CBC
HCT: 36.5 % (ref 36.0–46.0)
Hemoglobin: 12.1 g/dL (ref 12.0–15.0)
MCH: 29.5 pg (ref 26.0–34.0)
MCHC: 33.2 g/dL (ref 30.0–36.0)
MCV: 89 fL (ref 80.0–100.0)
Platelets: 256 10*3/uL (ref 150–400)
RBC: 4.1 MIL/uL (ref 3.87–5.11)
RDW: 12.4 % (ref 11.5–15.5)
WBC: 9.1 10*3/uL (ref 4.0–10.5)
nRBC: 0 % (ref 0.0–0.2)

## 2023-03-09 LAB — COMPREHENSIVE METABOLIC PANEL
ALT: 50 U/L — ABNORMAL HIGH (ref 0–44)
AST: 36 U/L (ref 15–41)
Albumin: 3.4 g/dL — ABNORMAL LOW (ref 3.5–5.0)
Alkaline Phosphatase: 80 U/L (ref 38–126)
Anion gap: 12 (ref 5–15)
BUN: 43 mg/dL — ABNORMAL HIGH (ref 8–23)
CO2: 34 mmol/L — ABNORMAL HIGH (ref 22–32)
Calcium: 9.8 mg/dL (ref 8.9–10.3)
Chloride: 89 mmol/L — ABNORMAL LOW (ref 98–111)
Creatinine, Ser: 1.39 mg/dL — ABNORMAL HIGH (ref 0.44–1.00)
GFR, Estimated: 42 mL/min — ABNORMAL LOW (ref >60)
Glucose, Bld: 215 mg/dL — ABNORMAL HIGH (ref 70–99)
Potassium: 4.3 mmol/L (ref 3.5–5.1)
Sodium: 135 mmol/L (ref 135–145)
Total Bilirubin: 0.3 mg/dL (ref <1.2)
Total Protein: 7.7 g/dL (ref 6.5–8.1)

## 2023-03-09 LAB — GLUCOSE, CAPILLARY
Glucose-Capillary: 182 mg/dL — ABNORMAL HIGH (ref 70–99)
Glucose-Capillary: 170 mg/dL — ABNORMAL HIGH (ref 70–99)
Glucose-Capillary: 304 mg/dL — ABNORMAL HIGH (ref 70–99)
Glucose-Capillary: 196 mg/dL — ABNORMAL HIGH (ref 70–99)

## 2023-03-09 LAB — COOXEMETRY PANEL
Carboxyhemoglobin: 1.2 % (ref 0.5–1.5)
Methemoglobin: <0.7 % (ref 0.0–1.5)
O2 Saturation: 57.2 %
Total hemoglobin: 12.5 g/dL (ref 12.0–16.0)

## 2023-03-09 MED ORDER — POTASSIUM CHLORIDE CRYS ER 20 MEQ PO TBCR
20.0000 meq | EXTENDED_RELEASE_TABLET | Freq: Once | ORAL | Status: AC
  Administered 2023-03-09: 20 meq via ORAL
  Filled 2023-03-09: qty 1

## 2023-03-09 NOTE — Progress Notes (Signed)
Advanced Heart Failure Rounding Note  PCP-Cardiologist: None   Subjective:    CT yesterday for L flank pain. No obvious pathology.  Feels better today but still with some flank pain.   Diuresing well on lasix gtt at 15. Out > 3L  Weight down only 1 pound  Co-ox 57% CVP 3-4  Scr 1.59 -> 1.39   Ucx > 100K GNR on ceftriaxone   Objective:   Weight Range: 83.9 kg Body mass index is 32.77 kg/m.   Vital Signs:   Temp:  [97.7 F (36.5 C)-98.5 F (36.9 C)] 97.7 F (36.5 C) (11/23 0741) Pulse Rate:  [91-102] 92 (11/23 0700) Resp:  [12-30] 15 (11/23 0700) BP: (90-139)/(59-103) 110/78 (11/23 0700) SpO2:  [68 %-99 %] 94 % (11/23 0700) Weight:  [83.9 kg] 83.9 kg (11/23 0500) Last BM Date : 03/06/23  Weight change: Filed Weights   03/07/23 0500 03/08/23 0500 03/09/23 0500  Weight: 84 kg 84.1 kg 83.9 kg    Intake/Output:   Intake/Output Summary (Last 24 hours) at 03/09/2023 1041 Last data filed at 03/09/2023 0700 Gross per 24 hour  Intake 307.86 ml  Output 3400 ml  Net -3092.14 ml    Physical Exam    General:  Obese woman lying in bed No resp difficulty HEENT: normal Neck: supple. no JVD. Carotids 2+ bilat; no bruits. No lymphadenopathy or thryomegaly appreciated. Cor: Regular rate & rhythm. No rubs, gallops or murmurs. Lungs: clear Abdomen: soft, nontender, nondistended. No hepatosplenomegaly. No bruits or masses. Good bowel sounds. Extremities: no cyanosis, clubbing, rash, edema Neuro: alert & orientedx3, cranial nerves grossly intact. moves all 4 extremities w/o difficulty. Affect pleasant   Telemetry   SR in 80-90s (personally reviewed)  Labs    CBC Recent Labs    03/08/23 0209 03/09/23 0426  WBC 7.4 9.1  HGB 11.0* 12.1  HCT 34.1* 36.5  MCV 89.5 89.0  PLT 215 256   Basic Metabolic Panel Recent Labs    56/38/75 0209 03/08/23 1402 03/09/23 0426  NA 136 139 135  K 4.0 4.0 4.3  CL 95* 95* 89*  CO2 29 25 34*  GLUCOSE 128* 100* 215*  BUN  40* 44* 43*  CREATININE 1.36* 1.59* 1.39*  CALCIUM 9.0 9.2 9.8  MG 1.9  --   --    Liver Function Tests Recent Labs    03/08/23 0209 03/09/23 0426  AST 51* 36  ALT 56* 50*  ALKPHOS 68 80  BILITOT 0.7 0.3  PROT 7.0 7.7  ALBUMIN 3.3* 3.4*   No results for input(s): "LIPASE", "AMYLASE" in the last 72 hours. Cardiac Enzymes No results for input(s): "CKTOTAL", "CKMB", "CKMBINDEX", "TROPONINI" in the last 72 hours.  BNP: BNP (last 3 results) Recent Labs    03/06/23 0814  BNP 2,236.0*    ProBNP (last 3 results) No results for input(s): "PROBNP" in the last 8760 hours.   D-Dimer No results for input(s): "DDIMER" in the last 72 hours.  Hemoglobin A1C Recent Labs    03/07/23 0258  HGBA1C 7.9*   Fasting Lipid Panel Recent Labs    03/07/23 0258  CHOL 113  HDL 57  LDLCALC 38  TRIG 91  CHOLHDL 2.0   Thyroid Function Tests No results for input(s): "TSH", "T4TOTAL", "T3FREE", "THYROIDAB" in the last 72 hours.  Invalid input(s): "FREET3"  Other results:   Imaging    CT CHEST ABDOMEN PELVIS W CONTRAST  Result Date: 03/08/2023 CLINICAL DATA:  Back and flank pain EXAM: CT CHEST, ABDOMEN,  AND PELVIS WITH CONTRAST TECHNIQUE: Multidetector CT imaging of the chest, abdomen and pelvis was performed following the standard protocol during bolus administration of intravenous contrast. RADIATION DOSE REDUCTION: This exam was performed according to the departmental dose-optimization program which includes automated exposure control, adjustment of the mA and/or kV according to patient size and/or use of iterative reconstruction technique. CONTRAST:  75mL OMNIPAQUE IOHEXOL 350 MG/ML SOLN COMPARISON:  CT 01/25/2023, CT chest 09/13/2020 FINDINGS: CT CHEST FINDINGS Cardiovascular: Moderate aortic atherosclerosis. No aneurysm. Coronary vascular calcification. Mild cardiomegaly. No pericardial effusion. Post CABG changes. Ulcerated appearing plaque at the mid distal descending thoracic  aorta series 3, image 39. Mediastinum/Nodes: Midline trachea. No thyroid mass. Multiple subcentimeter mediastinal lymph nodes. Esophagus within normal limits. Lungs/Pleura: Emphysema. Partial bilateral lower lobe consolidations. No pleural effusion or pneumothorax. Musculoskeletal: Sternotomy. No acute osseous abnormality. Fusion of the lower cervical spine. Fusion of T9 and T10 vertebral bodies CT ABDOMEN PELVIS FINDINGS Hepatobiliary: No focal liver abnormality is seen. No gallstones, gallbladder wall thickening, or biliary dilatation. Pancreas: Unremarkable. No pancreatic ductal dilatation or surrounding inflammatory changes. Spleen: Normal in size without focal abnormality. Adrenals/Urinary Tract: Adrenal glands are normal. Kidneys show no hydronephrosis. Multiple small nonobstructing kidney stones. The bladder is unremarkable. Poor excretion of contrast from the kidneys on delayed views. Stomach/Bowel: The stomach is nonenlarged. There is no dilated small bowel. No acute bowel wall thickening. Negative appendix Vascular/Lymphatic: Advanced aortic atherosclerosis. No aneurysm. No suspicious lymph nodes Reproductive: Status post hysterectomy. No adnexal masses. Other: Negative for pelvic effusion or free air. Musculoskeletal: No acute osseous abnormality. Multilevel degenerative changes. IMPRESSION: 1. Partial bilateral lower lobe consolidations, atelectasis versus pneumonia versus aspiration. IEmphysema. 2. No CT evidence for acute intra-abdominal or pelvic abnormality. 3. Multiple small nonobstructing kidney stones. Poor excretion of contrast from the kidneys on delayed views suggesting decreased renal function, correlate with appropriate laboratory studies. Aortic Atherosclerosis (ICD10-I70.0) and Emphysema (ICD10-J43.9). Electronically Signed   By: Jasmine Pang M.D.   On: 03/08/2023 17:45     Medications:     Scheduled Medications:  aspirin EC  81 mg Oral Daily   atorvastatin  80 mg Oral QHS    Chlorhexidine Gluconate Cloth  6 each Topical Daily   clopidogrel  75 mg Oral Daily   FLUoxetine  80 mg Oral BID   gabapentin  600 mg Oral QHS   heparin  5,000 Units Subcutaneous Q8H   insulin aspart  0-15 Units Subcutaneous TID WC   insulin aspart  3 Units Subcutaneous TID WC   insulin glargine-yfgn  15 Units Subcutaneous Daily   lidocaine  1 patch Transdermal Q24H   sodium chloride flush  10-40 mL Intracatheter Q12H   sodium chloride flush  3 mL Intravenous Q12H   spironolactone  12.5 mg Oral Daily   traZODone  50 mg Oral QHS    Infusions:  cefTRIAXone (ROCEPHIN)  IV Stopped (03/08/23 1837)   furosemide (LASIX) 200 mg in dextrose 5 % 100 mL (2 mg/mL) infusion 15 mg/hr (03/09/23 0700)    PRN Medications: acetaminophen, morphine injection, nitroGLYCERIN, ondansetron (ZOFRAN) IV, mouth rinse, oxyCODONE, sodium chloride flush, sodium chloride flush  Patient Profile   Sonia Morales is a 67 y.o. female with past medical history of HFrEF, CAD manifested by STEMI in 2017 s/p PCI to LAD, NSTEMI c/b cardiogenic shock s/p emergent CABG (LIMA to LAD, SVG to OM) in 2022, and type 2 diabetes.   Now admitted for NSTEMI.   Assessment/Plan   A/C HFrEF - ischemic cardiomyopathy -  3/23 Echo: 40-45% (improved from 35% pre-CABG in 9/22) GIDD, diffuse LV regional wall motion hypokinesis - Echo 11/24  EF 20-25%. Rv nl. Mild MR and TR. - RHC: severely elevated LVEDP 42, PCWP 33, PAP 65/33/47, RA 23, CO/CI 4.39/2.33 - Diuresing well on lasix gtt at 15 CVP 3-4. Stop lasix gtt. Switch to po in next day or two - Continue spiro 25 daily - Holding losartan with recent bump in sCr. Can restart soon - No SGLT2i with recurrent UTIs   2. CAD with NSTEMI - STEMI in 2017 s/p PCI to LAD, NSTEMI c/b cardiogenic shock s/p emergent CABG (LIMA to LAD, SVG to OM) in 2022 - LHC: critical left main coronary stenosis (mLM to dLM 95%, ost LAD to pLAD 100%, ostCx to pCx 100%), patent LIMA to LAD, patent SVG to OM -  Substernal L sided chest pain 8/10 - No s/s angina - Continue Asprin - Continue Plavix  - Continue Lipitor   3. UTI - history of recurrent UTIs - 10/11 bilateral non-obstructing renal calculi noted on CT scan - Renal US normal without evidence of previous renal calculi - Ucx > 100K GNR on ceftriaxone. Await speciation/sensitivities   4. AKI: in the setting of volume overload and UTI - baseline sCr 0.8 - 1.03>1.55>1.39 today  5. DMII - SSI per primary team  6. Flank pain - CT C/A/P 11/22: non obstructing renal stones - unclear etiology ? Early pyelo not seen on CT   Ok to go to floor. Needs PT/OT   Length of Stay: 2  Arvilla Meres, MD  03/09/2023, 10:41 AM  Advanced Heart Failure Team Pager [phone] (M-F; 7a - 5p)  Please contact [facility] for night-coverage after hours (5p -7a ) and weekends on [email or web address]

## 2023-03-09 NOTE — Plan of Care (Signed)
  Problem: Activity: Goal: Risk for activity intolerance will decrease 03/09/2023 1743 by Hollace Kinnier, RN Outcome: Progressing 03/09/2023 1741 by Henry Russel T, RN Outcome: Progressing   Problem: Safety: Goal: Ability to remain free from injury will improve Outcome: Progressing

## 2023-03-10 DIAGNOSIS — I214 Non-ST elevation (NSTEMI) myocardial infarction: Secondary | ICD-10-CM

## 2023-03-10 LAB — COOXEMETRY PANEL
Carboxyhemoglobin: 1.5 % (ref 0.5–1.5)
Methemoglobin: <0.7 % (ref 0.0–1.5)
O2 Saturation: 70.1 %
Total hemoglobin: 11.6 g/dL — ABNORMAL LOW (ref 12.0–16.0)

## 2023-03-10 LAB — COMPREHENSIVE METABOLIC PANEL
ALT: 35 U/L (ref 0–44)
AST: 22 U/L (ref 15–41)
Albumin: 3 g/dL — ABNORMAL LOW (ref 3.5–5.0)
Alkaline Phosphatase: 75 U/L (ref 38–126)
Anion gap: 10 (ref 5–15)
BUN: 43 mg/dL — ABNORMAL HIGH (ref 8–23)
CO2: 28 mmol/L (ref 22–32)
Calcium: 9 mg/dL (ref 8.9–10.3)
Chloride: 96 mmol/L — ABNORMAL LOW (ref 98–111)
Creatinine, Ser: 1.37 mg/dL — ABNORMAL HIGH (ref 0.44–1.00)
GFR, Estimated: 43 mL/min — ABNORMAL LOW (ref >60)
Glucose, Bld: 241 mg/dL — ABNORMAL HIGH (ref 70–99)
Potassium: 3.8 mmol/L (ref 3.5–5.1)
Sodium: 134 mmol/L — ABNORMAL LOW (ref 135–145)
Total Bilirubin: 0.2 mg/dL (ref <1.2)
Total Protein: 6.8 g/dL (ref 6.5–8.1)

## 2023-03-10 LAB — CBC
HCT: 33.5 % — ABNORMAL LOW (ref 36.0–46.0)
Hemoglobin: 11 g/dL — ABNORMAL LOW (ref 12.0–15.0)
MCH: 29 pg (ref 26.0–34.0)
MCHC: 32.8 g/dL (ref 30.0–36.0)
MCV: 88.4 fL (ref 80.0–100.0)
Platelets: 247 10*3/uL (ref 150–400)
RBC: 3.79 MIL/uL — ABNORMAL LOW (ref 3.87–5.11)
RDW: 12.4 % (ref 11.5–15.5)
WBC: 7 10*3/uL (ref 4.0–10.5)
nRBC: 0 % (ref 0.0–0.2)

## 2023-03-10 LAB — URINE CULTURE: Culture: >=100000 — AB

## 2023-03-10 LAB — GLUCOSE, CAPILLARY
Glucose-Capillary: 206 mg/dL — ABNORMAL HIGH (ref 70–99)
Glucose-Capillary: 202 mg/dL — ABNORMAL HIGH (ref 70–99)
Glucose-Capillary: 231 mg/dL — ABNORMAL HIGH (ref 70–99)

## 2023-03-10 MED ORDER — METOPROLOL SUCCINATE ER 25 MG PO TB24
25.0000 mg | ORAL_TABLET | Freq: Every day | ORAL | Status: DC
  Administered 2023-03-10 – 2023-03-11 (×2): 25 mg via ORAL
  Filled 2023-03-10 (×3): qty 1

## 2023-03-10 MED ORDER — INSULIN GLARGINE-YFGN 100 UNIT/ML ~~LOC~~ SOLN
22.0000 [IU] | Freq: Every day | SUBCUTANEOUS | Status: DC
  Administered 2023-03-10 – 2023-03-11 (×2): 22 [IU] via SUBCUTANEOUS
  Filled 2023-03-10 (×2): qty 0.22

## 2023-03-10 MED ORDER — FUROSEMIDE 40 MG PO TABS
40.0000 mg | ORAL_TABLET | Freq: Every day | ORAL | Status: DC
Start: 2023-03-10 — End: 2023-03-11
  Administered 2023-03-10 – 2023-03-11 (×2): 40 mg via ORAL
  Filled 2023-03-10 (×2): qty 1

## 2023-03-10 MED ORDER — BISACODYL 5 MG PO TBEC: 5.0000 mg | DELAYED_RELEASE_TABLET | Freq: Every day | ORAL | Status: DC | PRN

## 2023-03-10 MED ORDER — SODIUM CHLORIDE 0.9 % IV SOLN
1.0000 g | INTRAVENOUS | Status: DC
  Administered 2023-03-10: 1 g via INTRAVENOUS
  Filled 2023-03-10: qty 10

## 2023-03-10 MED ORDER — LOSARTAN POTASSIUM 25 MG PO TABS
12.5000 mg | ORAL_TABLET | Freq: Every day | ORAL | Status: DC
  Administered 2023-03-10 – 2023-03-11 (×2): 12.5 mg via ORAL
  Filled 2023-03-10 (×3): qty 1

## 2023-03-10 MED ORDER — POLYETHYLENE GLYCOL 3350 17 G PO PACK
17.0000 g | PACK | Freq: Every day | ORAL | Status: DC
  Administered 2023-03-10 – 2023-03-12 (×3): 17 g via ORAL
  Filled 2023-03-10 (×4): qty 1

## 2023-03-10 NOTE — Progress Notes (Signed)
Sonia Morales  FAO:[id number] DOB: [birth year] DOA: 03/06/2023 PCP: Richardean Chimera, MD    Brief Narrative:  67 year old with a complex history of CAD to include STEMI 2017 status post LAD PCI and then non-STEMI with cardiogenic shock 2022 status post emergent CABG x 2, ischemic cardiomyopathy with a EF 40-45%, uncontrolled DM2, and HLD who presented to the ER 11/20 with chest pain accompanied by 1 to 2 days of dyspnea on exertion and orthopnea.  She was found to be hypoxic on arrival at 82% on room air.  Goals of Care:   Code Status: Full Code   DVT prophylaxis: heparin injection 5,000 Units Start: 03/07/23 0600   Interim Hx: Afebrile.  Vital signs stable.  Requiring oxygen support at 8 L high flow nasal cannula to keep saturations 92-97%.  CBG 196-304.  In good spirits today with no new complaints.  Reports ongoing left flank discomfort without change.  Assessment & Plan:  Acute exacerbation of chronic systolic CHF -ischemic cardiomyopathy Management per advanced heart failure team -diuresis ongoing -TTE this admission noted EF 20-25% with mild MR and TR and normal RV compared to EF 40-45% on prior echo March 2023 -right heart cath 03/06/2023 revealed severely elevated LVEDP -net negative approximately 7 L since admission   Filed Weights   03/08/23 0500 03/09/23 0500 03/10/23 0500  Weight: 84.1 kg 83.9 kg 84.4 kg     CAD with acute NSTEMI Cardiac cath 03/06/2023 revealed no focal targets for PCI with critical left main stenosis but LAD and OM filling from patent bypass grafts -note was made of a small to moderate caliber intermediate branch that was not grafted -continue aspirin and Plavix per cardiology -continue statin  Acute hypoxic respiratory failure Felt to primarily be due to pulmonary edema -diuresis ongoing -CT chest noted bilateral lower lobe consolidations consistent with atelectasis versus infiltrate -no clinical evidence to suggest active pulmonary  infection  Uncontrolled DM2 with hyperglycemia A1c 8.0 -CBG is not at goal - adjust insulin regimen and follow trend  History of recurrent UTIs - left flank pain - E. coli UTI POA UA consistent with UTI -renal ultrasound noted no hydronephrosis -CT abdomen confirmed multiple small nonobstructing kidney stones - no SGLT2i as a result  Acute kidney injury Baseline creatinine 0.8 -creatinine peaked at 1.5 by -improving with diuresis  Normocytic anemia Check anemia panel -no evidence of gross blood loss  Family Communication: No family present at time of exam Disposition: Stable for transfer out of ICU -begin PT/OT   Objective: Blood pressure (!) 137/93, pulse 97, temperature 98.4 F (36.9 C), temperature source Oral, resp. rate (!) 25, height 5\' 3"  (1.6 m), weight 84.4 kg, SpO2 93%.  Intake/Output Summary (Last 24 hours) at 03/10/2023 0744 Last data filed at 03/10/2023 0700 Gross per 24 hour  Intake 762.44 ml  Output 1050 ml  Net -287.56 ml   Filed Weights   03/08/23 0500 03/09/23 0500 03/10/23 0500  Weight: 84.1 kg 83.9 kg 84.4 kg    Examination: General: No acute respiratory distress Lungs: Clear to auscultation bilaterally without wheezes or crackles Cardiovascular: Regular rate and rhythm without murmur gallop or rub normal S1 and S2 Abdomen: Nontender, nondistended, soft, bowel sounds positive, no rebound, no ascites, no appreciable mass Extremities: No significant cyanosis, clubbing, or edema bilateral lower extremities  CBC: Recent Labs  Lab 03/08/23 0209 03/09/23 0426 03/10/23 0458  WBC 7.4 9.1 7.0  HGB 11.0* 12.1 11.0*  HCT 34.1* 36.5 33.5*  MCV 89.5 89.0 88.4  PLT 215 256 247   Basic Metabolic Panel: Recent Labs  Lab 03/08/23 0209 03/08/23 1402 03/09/23 0426 03/10/23 0458  NA 136 139 135 134*  K 4.0 4.0 4.3 3.8  CL 95* 95* 89* 96*  CO2 29 25 34* 28  GLUCOSE 128* 100* 215* 241*  BUN 40* 44* 43* 43*  CREATININE 1.36* 1.59* 1.39* 1.37*  CALCIUM  9.0 9.2 9.8 9.0  MG 1.9  --   --   --    GFR: Estimated Creatinine Clearance: 42.1 mL/min (A) (by C-G formula based on SCr of 1.37 mg/dL (H)).   Scheduled Meds:  aspirin EC  81 mg Oral Daily   atorvastatin  80 mg Oral QHS   Chlorhexidine Gluconate Cloth  6 each Topical Daily   clopidogrel  75 mg Oral Daily   FLUoxetine  80 mg Oral BID   gabapentin  600 mg Oral QHS   heparin  5,000 Units Subcutaneous Q8H   insulin aspart  0-15 Units Subcutaneous TID WC   insulin aspart  3 Units Subcutaneous TID WC   insulin glargine-yfgn  15 Units Subcutaneous Daily   lidocaine  1 patch Transdermal Q24H   sodium chloride flush  10-40 mL Intracatheter Q12H   sodium chloride flush  3 mL Intravenous Q12H   spironolactone  12.5 mg Oral Daily   traZODone  50 mg Oral QHS   Continuous Infusions:  cefTRIAXone (ROCEPHIN)  IV 1 g (03/09/23 1623)     LOS: 3 days   Lonia Blood, MD [facility] Office  [phone] Pager - Text Page per Loretha Stapler  If 7PM-7AM, please contact night-coverage per Amion 03/10/2023, 7:44 AM

## 2023-03-10 NOTE — Progress Notes (Signed)
Advanced Heart Failure Rounding Note  PCP-Cardiologist: None   Subjective:    CT 11/22 for L flank pain. No obvious pathology.  Feels ok. L flank still a bit sore but improving.   Ucx 100k e.coli (ceftriaxone sens)  No CP or SOB    Objective:   Weight Range: 84.4 kg Body mass index is 32.96 kg/m.   Vital Signs:   Temp:  [97.8 F (36.6 C)-98.6 F (37 C)] 98.6 F (37 C) (11/24 1107) Pulse Rate:  [90-104] 102 (11/24 1000) Resp:  [14-25] 18 (11/24 1000) BP: (90-137)/(50-93) 114/71 (11/24 1000) SpO2:  [89 %-98 %] 93 % (11/24 1000) Weight:  [84.4 kg] 84.4 kg (11/24 0500) Last BM Date : 03/06/23  Weight change: Filed Weights   03/08/23 0500 03/09/23 0500 03/10/23 0500  Weight: 84.1 kg 83.9 kg 84.4 kg    Intake/Output:   Intake/Output Summary (Last 24 hours) at 03/10/2023 1315 Last data filed at 03/10/2023 0700 Gross per 24 hour  Intake 282.44 ml  Output 1050 ml  Net -767.56 ml    Physical Exam    General:  Obese woman lying in bed No resp difficulty HEENT: normal Neck: supple. no JVD. Carotids 2+ bilat; no bruits. No lymphadenopathy or thryomegaly appreciated. Cor: PMI nondisplaced. Regular rate & rhythm. No rubs, gallops or murmurs. Lungs: clear Abdomen: obese soft, mildly tender in LUQ nondistended. No hepatosplenomegaly. No bruits or masses. Good bowel sounds. Extremities: no cyanosis, clubbing, rash, tr edema Neuro: alert & orientedx3, cranial nerves grossly intact. moves all 4 extremities w/o difficulty. Affect pleasant   Telemetry   SR in 90-100 (personally reviewed)  Labs    CBC Recent Labs    03/09/23 0426 03/10/23 0458  WBC 9.1 7.0  HGB 12.1 11.0*  HCT 36.5 33.5*  MCV 89.0 88.4  PLT 256 247   Basic Metabolic Panel Recent Labs    82/95/62 0209 03/08/23 1402 03/09/23 0426 03/10/23 0458  NA 136   < > 135 134*  K 4.0   < > 4.3 3.8  CL 95*   < > 89* 96*  CO2 29   < > 34* 28  GLUCOSE 128*   < > 215* 241*  BUN 40*   < > 43* 43*   CREATININE 1.36*   < > 1.39* 1.37*  CALCIUM 9.0   < > 9.8 9.0  MG 1.9  --   --   --    < > = values in this interval not displayed.   Liver Function Tests Recent Labs    03/09/23 0426 03/10/23 0458  AST 36 22  ALT 50* 35  ALKPHOS 80 75  BILITOT 0.3 0.2  PROT 7.7 6.8  ALBUMIN 3.4* 3.0*   No results for input(s): "LIPASE", "AMYLASE" in the last 72 hours. Cardiac Enzymes No results for input(s): "CKTOTAL", "CKMB", "CKMBINDEX", "TROPONINI" in the last 72 hours.  BNP: BNP (last 3 results) Recent Labs    03/06/23 0814  BNP 2,236.0*    ProBNP (last 3 results) No results for input(s): "PROBNP" in the last 8760 hours.   D-Dimer No results for input(s): "DDIMER" in the last 72 hours.  Hemoglobin A1C No results for input(s): "HGBA1C" in the last 72 hours.  Fasting Lipid Panel No results for input(s): "CHOL", "HDL", "LDLCALC", "TRIG", "CHOLHDL", "LDLDIRECT" in the last 72 hours.  Thyroid Function Tests No results for input(s): "TSH", "T4TOTAL", "T3FREE", "THYROIDAB" in the last 72 hours.  Invalid input(s): "FREET3"  Other results:   Imaging  No results found.   Medications:     Scheduled Medications:  aspirin EC  81 mg Oral Daily   atorvastatin  80 mg Oral QHS   Chlorhexidine Gluconate Cloth  6 each Topical Daily   clopidogrel  75 mg Oral Daily   FLUoxetine  80 mg Oral BID   gabapentin  600 mg Oral QHS   heparin  5,000 Units Subcutaneous Q8H   insulin aspart  0-15 Units Subcutaneous TID WC   insulin aspart  3 Units Subcutaneous TID WC   insulin glargine-yfgn  22 Units Subcutaneous Daily   lidocaine  1 patch Transdermal Q24H   sodium chloride flush  10-40 mL Intracatheter Q12H   spironolactone  12.5 mg Oral Daily   traZODone  50 mg Oral QHS    Infusions:  cefTRIAXone (ROCEPHIN)  IV      PRN Medications: acetaminophen, morphine injection, nitroGLYCERIN, ondansetron (ZOFRAN) IV, mouth rinse, oxyCODONE, sodium chloride flush  Patient Profile    Sonia Morales is a 67 y.o. female with past medical history of HFrEF, CAD manifested by STEMI in 2017 s/p PCI to LAD, NSTEMI c/b cardiogenic shock s/p emergent CABG (LIMA to LAD, SVG to OM) in 2022, and type 2 diabetes.   Now admitted for NSTEMI.   Assessment/Plan   A/C HFrEF - ischemic cardiomyopathy - 3/23 Echo: 40-45% (improved from 35% pre-CABG in 9/22) GIDD, diffuse LV regional wall motion hypokinesis - Echo 11/24  EF 20-25%. Rv nl. Mild MR and TR. - RHC: severely elevated LVEDP 42, PCWP 33, PAP 65/33/47, RA 23, CO/CI 4.39/2.33 - Volume status starting to creep back up. Resume po diuretics - Continue spiro 25 daily - Holding losartan with recent bump in sCr. Will restart today - Add low-dose toprol  - No SGLT2i with recurrent UTIs   2. CAD with NSTEMI - STEMI in 2017 s/p PCI to LAD, NSTEMI c/b cardiogenic shock s/p emergent CABG (LIMA to LAD, SVG to OM) in 2022 - LHC: critical left main coronary stenosis (mLM to dLM 95%, ost LAD to pLAD 100%, ostCx to pCx 100%), patent LIMA to LAD, patent SVG to OM - Substernal L sided chest pain 8/10 - No s/s angina - Continue Asprin - Continue Plavix  - Continue Lipitor   3. UTI - history of recurrent UTIs - 10/11 bilateral non-obstructing renal calculi noted on CT scan - Renal US normal without evidence of previous renal calculi - Ucx > 100K GNR on ceftriaxone. PEr primary   4. AKI: in the setting of volume overload and UTI - baseline sCr 0.8 - 1.03>1.55>1.39 > 1.37 today  5. DMII - SSI per primary team  6. Flank pain - CT C/A/P 11/22: non obstructing renal stones - unclear etiology ? Early pyelo not seen on CT   Nearing readiness for d/c from cardiac standpoint.   Length of Stay: 3  Arvilla Meres, MD  03/10/2023, 1:15 PM  Advanced Heart Failure Team Pager [phone] (M-F; 7a - 5p)  Please contact [facility] for night-coverage after hours (5p -7a ) and weekends on [email or web address]

## 2023-03-10 NOTE — Plan of Care (Signed)
Problem: Education: Goal: Knowledge of General Education information will improve Description: Including pain rating scale, medication(s)/side effects and non-pharmacologic comfort measures Outcome: Progressing   Problem: Health Behavior/Discharge Planning: Goal: Ability to manage health-related needs will improve Outcome: Progressing   Problem: Clinical Measurements: Goal: Ability to maintain clinical measurements within normal limits will improve Outcome: Progressing Goal: Will remain free from infection Outcome: Progressing Goal: Diagnostic test results will improve Outcome: Progressing Goal: Respiratory complications will improve Outcome: Progressing Goal: Cardiovascular complication will be avoided Outcome: Progressing   Problem: Activity: Goal: Risk for activity intolerance will decrease Outcome: Progressing   Problem: Nutrition: Goal: Adequate nutrition will be maintained Outcome: Progressing   Problem: Coping: Goal: Level of anxiety will decrease Outcome: Progressing   Problem: Elimination: Goal: Will not experience complications related to bowel motility Outcome: Progressing Goal: Will not experience complications related to urinary retention Outcome: Progressing   Problem: Pain Management: Goal: General experience of comfort will improve Outcome: Progressing   Problem: Safety: Goal: Ability to remain free from injury will improve Outcome: Progressing   Problem: Skin Integrity: Goal: Risk for impaired skin integrity will decrease Outcome: Progressing   Problem: Education: Goal: Ability to describe self-care measures that may prevent or decrease complications (Diabetes Survival Skills Education) will improve Outcome: Progressing Goal: Individualized Educational Video(s) Outcome: Progressing   Problem: Coping: Goal: Ability to adjust to condition or change in health will improve Outcome: Progressing   Problem: Fluid Volume: Goal: Ability to  maintain a balanced intake and output will improve Outcome: Progressing   Problem: Health Behavior/Discharge Planning: Goal: Ability to identify and utilize available resources and services will improve Outcome: Progressing Goal: Ability to manage health-related needs will improve Outcome: Progressing   Problem: Metabolic: Goal: Ability to maintain appropriate glucose levels will improve Outcome: Progressing   Problem: Nutritional: Goal: Maintenance of adequate nutrition will improve Outcome: Progressing Goal: Progress toward achieving an optimal weight will improve Outcome: Progressing   Problem: Skin Integrity: Goal: Risk for impaired skin integrity will decrease Outcome: Progressing   Problem: Tissue Perfusion: Goal: Adequacy of tissue perfusion will improve Outcome: Progressing   Problem: Education: Goal: Understanding of cardiac disease, CV risk reduction, and recovery process will improve Outcome: Progressing Goal: Individualized Educational Video(s) Outcome: Progressing   Problem: Activity: Goal: Ability to tolerate increased activity will improve Outcome: Progressing   Problem: Cardiac: Goal: Ability to achieve and maintain adequate cardiovascular perfusion will improve Outcome: Progressing   Problem: Health Behavior/Discharge Planning: Goal: Ability to safely manage health-related needs after discharge will improve Outcome: Progressing   Problem: Education: Goal: Understanding of CV disease, CV risk reduction, and recovery process will improve Outcome: Progressing Goal: Individualized Educational Video(s) Outcome: Progressing   Problem: Activity: Goal: Ability to return to baseline activity level will improve Outcome: Progressing   Problem: Cardiovascular: Goal: Ability to achieve and maintain adequate cardiovascular perfusion will improve Outcome: Progressing Goal: Vascular access site(s) Level 0-1 will be maintained Outcome: Progressing    Problem: Health Behavior/Discharge Planning: Goal: Ability to safely manage health-related needs after discharge will improve Outcome: Progressing

## 2023-03-10 NOTE — Plan of Care (Signed)
  Problem: Education: Goal: Knowledge of General Education information will improve Description: Including pain rating scale, medication(s)/side effects and non-pharmacologic comfort measures Outcome: Progressing   Problem: Health Behavior/Discharge Planning: Goal: Ability to manage health-related needs will improve Outcome: Progressing   Problem: Clinical Measurements: Goal: Ability to maintain clinical measurements within normal limits will improve Outcome: Progressing Goal: Will remain free from infection Outcome: Progressing Goal: Diagnostic test results will improve Outcome: Progressing Goal: Respiratory complications will improve Outcome: Progressing Goal: Cardiovascular complication will be avoided Outcome: Progressing   Problem: Activity: Goal: Risk for activity intolerance will decrease Outcome: Progressing   Problem: Nutrition: Goal: Adequate nutrition will be maintained Outcome: Progressing   Problem: Coping: Goal: Level of anxiety will decrease Outcome: Progressing   Problem: Elimination: Goal: Will not experience complications related to bowel motility Outcome: Progressing Goal: Will not experience complications related to urinary retention Outcome: Progressing   Problem: Pain Management: Goal: General experience of comfort will improve Outcome: Progressing   Problem: Safety: Goal: Ability to remain free from injury will improve Outcome: Progressing   Problem: Skin Integrity: Goal: Risk for impaired skin integrity will decrease Outcome: Progressing   Problem: Fluid Volume: Goal: Ability to maintain a balanced intake and output will improve Outcome: Progressing   Problem: Nutritional: Goal: Maintenance of adequate nutrition will improve Outcome: Progressing Goal: Progress toward achieving an optimal weight will improve Outcome: Progressing   Problem: Education: Goal: Understanding of cardiac disease, CV risk reduction, and recovery process  will improve Outcome: Progressing   Problem: Activity: Goal: Ability to tolerate increased activity will improve Outcome: Progressing   Problem: Cardiac: Goal: Ability to achieve and maintain adequate cardiovascular perfusion will improve Outcome: Progressing   Problem: Education: Goal: Understanding of CV disease, CV risk reduction, and recovery process will improve Outcome: Progressing

## 2023-03-11 DIAGNOSIS — I214 Non-ST elevation (NSTEMI) myocardial infarction: Secondary | ICD-10-CM | POA: Diagnosis not present

## 2023-03-11 LAB — BASIC METABOLIC PANEL
Anion gap: 11 (ref 5–15)
BUN: 46 mg/dL — ABNORMAL HIGH (ref 8–23)
CO2: 28 mmol/L (ref 22–32)
Calcium: 9.3 mg/dL (ref 8.9–10.3)
Chloride: 95 mmol/L — ABNORMAL LOW (ref 98–111)
Creatinine, Ser: 1.27 mg/dL — ABNORMAL HIGH (ref 0.44–1.00)
GFR, Estimated: 47 mL/min — ABNORMAL LOW (ref >60)
Glucose, Bld: 228 mg/dL — ABNORMAL HIGH (ref 70–99)
Potassium: 4 mmol/L (ref 3.5–5.1)
Sodium: 134 mmol/L — ABNORMAL LOW (ref 135–145)

## 2023-03-11 LAB — RETICULOCYTES
Immature Retic Fract: 16.2 % — ABNORMAL HIGH (ref 2.3–15.9)
RBC.: 3.77 MIL/uL — ABNORMAL LOW (ref 3.87–5.11)
Retic Count, Absolute: 91.2 10*3/uL (ref 19.0–186.0)
Retic Ct Pct: 2.4 % (ref 0.4–3.1)

## 2023-03-11 LAB — GLUCOSE, CAPILLARY
Glucose-Capillary: 228 mg/dL — ABNORMAL HIGH (ref 70–99)
Glucose-Capillary: 224 mg/dL — ABNORMAL HIGH (ref 70–99)
Glucose-Capillary: 235 mg/dL — ABNORMAL HIGH (ref 70–99)
Glucose-Capillary: 224 mg/dL — ABNORMAL HIGH (ref 70–99)

## 2023-03-11 LAB — COOXEMETRY PANEL
Carboxyhemoglobin: 1.6 % — ABNORMAL HIGH (ref 0.5–1.5)
Methemoglobin: <0.7 % (ref 0.0–1.5)
O2 Saturation: 90.7 %
Total hemoglobin: 11.8 g/dL — ABNORMAL LOW (ref 12.0–16.0)

## 2023-03-11 LAB — FERRITIN: Ferritin: 90 ng/mL (ref 11–307)

## 2023-03-11 LAB — VITAMIN B12: Vitamin B-12: 1401 pg/mL — ABNORMAL HIGH (ref 180–914)

## 2023-03-11 LAB — CBC
HCT: 34.3 % — ABNORMAL LOW (ref 36.0–46.0)
Hemoglobin: 11.2 g/dL — ABNORMAL LOW (ref 12.0–15.0)
MCH: 29.4 pg (ref 26.0–34.0)
MCHC: 32.7 g/dL (ref 30.0–36.0)
MCV: 90 fL (ref 80.0–100.0)
Platelets: 255 10*3/uL (ref 150–400)
RBC: 3.81 MIL/uL — ABNORMAL LOW (ref 3.87–5.11)
RDW: 12.7 % (ref 11.5–15.5)
WBC: 7.2 10*3/uL (ref 4.0–10.5)
nRBC: 0 % (ref 0.0–0.2)

## 2023-03-11 LAB — FOLATE: Folate: 31.1 ng/mL (ref >5.9)

## 2023-03-11 LAB — IRON AND TIBC
Iron: 50 ug/dL (ref 28–170)
Saturation Ratios: 16 % (ref 10.4–31.8)
TIBC: 312 ug/dL (ref 250–450)
UIBC: 262 ug/dL

## 2023-03-11 MED ORDER — INSULIN GLARGINE-YFGN 100 UNIT/ML ~~LOC~~ SOLN
30.0000 [IU] | Freq: Every day | SUBCUTANEOUS | Status: DC
Start: 2023-03-12 — End: 2023-03-12
  Administered 2023-03-12: 30 [IU] via SUBCUTANEOUS
  Filled 2023-03-11: qty 0.3

## 2023-03-11 MED ORDER — FUROSEMIDE 40 MG PO TABS
40.0000 mg | ORAL_TABLET | Freq: Once | ORAL | Status: AC
  Administered 2023-03-11: 40 mg via ORAL
  Filled 2023-03-11: qty 1

## 2023-03-11 MED ORDER — INSULIN GLARGINE-YFGN 100 UNIT/ML ~~LOC~~ SOLN
8.0000 [IU] | Freq: Once | SUBCUTANEOUS | Status: AC
  Administered 2023-03-11: 8 [IU] via SUBCUTANEOUS
  Filled 2023-03-11: qty 0.08

## 2023-03-11 MED ORDER — TORSEMIDE 20 MG PO TABS
60.0000 mg | ORAL_TABLET | Freq: Every day | ORAL | Status: DC
  Administered 2023-03-11 – 2023-03-12 (×2): 60 mg via ORAL
  Filled 2023-03-11 (×2): qty 3

## 2023-03-11 MED ORDER — SPIRONOLACTONE 25 MG PO TABS
25.0000 mg | ORAL_TABLET | Freq: Every day | ORAL | Status: DC
Start: 2023-03-12 — End: 2023-03-13
  Administered 2023-03-12: 25 mg via ORAL
  Filled 2023-03-11 (×2): qty 1

## 2023-03-11 MED ORDER — CIPROFLOXACIN HCL 500 MG PO TABS
500.0000 mg | ORAL_TABLET | Freq: Two times a day (BID) | ORAL | Status: DC
  Administered 2023-03-11 – 2023-03-13 (×4): 500 mg via ORAL
  Filled 2023-03-11 (×4): qty 1

## 2023-03-11 NOTE — H&P (View-Only) (Signed)
Advanced Heart Failure Rounding Note  PCP-Cardiologist: None   Subjective:    1.3L UOP on PO Lasix. On 6L [place]. Encouraged aggressive IS use.  Sitting up in chair, eating breakfast. No CP or SOB. L flank pain improved, although still present at times.   Objective:   Weight Range: 83.3 kg Body mass index is 32.52 kg/m.   Vital Signs:   Temp:  [97.7 F (36.5 C)-98.6 F (37 C)] 98.2 F (36.8 C) (11/25 0744) Pulse Rate:  [84-102] 89 (11/25 0744) Resp:  [16-26] 20 (11/25 0814) BP: (93-127)/(58-96) 104/61 (11/25 0814) SpO2:  [92 %-99 %] 97 % (11/25 0744) Weight:  [83.3 kg] 83.3 kg (11/25 0500) Last BM Date : 03/10/23  Weight change: Filed Weights   03/09/23 0500 03/10/23 0500 03/11/23 0500  Weight: 83.9 kg 84.4 kg 83.3 kg    Intake/Output:   Intake/Output Summary (Last 24 hours) at 03/11/2023 0843 Last data filed at 03/10/2023 2000 Gross per 24 hour  Intake 578.34 ml  Output 1300 ml  Net -721.66 ml    Physical Exam    General: Sitting up in chair. No distress on 6L [place]. HEENT: neck supple.   Cardiac: JVP 8cm. S1 and S2 present. No murmurs or rub. Resp: Lung sounds clear uppers and fine crackles in the lowers Abdomen: Obese. Soft, non-tender, non-distended. + BS. Extremities: Warm and dry. No rash, cyanosis.  No edema.  Neuro: Alert and oriented x3. Affect pleasant. Moves all extremities without difficulty.  Telemetry   SR in 80s (personally reviewed)  Labs    CBC Recent Labs    03/10/23 0458 03/11/23 0434  WBC 7.0 7.2  HGB 11.0* 11.2*  HCT 33.5* 34.3*  MCV 88.4 90.0  PLT 247 255   Basic Metabolic Panel Recent Labs    62/13/08 0458 03/11/23 0434  NA 134* 134*  K 3.8 4.0  CL 96* 95*  CO2 28 28  GLUCOSE 241* 228*  BUN 43* 46*  CREATININE 1.37* 1.27*  CALCIUM 9.0 9.3   Liver Function Tests Recent Labs    03/09/23 0426 03/10/23 0458  AST 36 22  ALT 50* 35  ALKPHOS 80 75  BILITOT 0.3 0.2  PROT 7.7 6.8  ALBUMIN 3.4* 3.0*   No  results for input(s): "LIPASE", "AMYLASE" in the last 72 hours. Cardiac Enzymes No results for input(s): "CKTOTAL", "CKMB", "CKMBINDEX", "TROPONINI" in the last 72 hours.  BNP: BNP (last 3 results) Recent Labs    03/06/23 0814  BNP 2,236.0*    ProBNP (last 3 results) No results for input(s): "PROBNP" in the last 8760 hours.   D-Dimer No results for input(s): "DDIMER" in the last 72 hours.  Hemoglobin A1C No results for input(s): "HGBA1C" in the last 72 hours.  Fasting Lipid Panel No results for input(s): "CHOL", "HDL", "LDLCALC", "TRIG", "CHOLHDL", "LDLDIRECT" in the last 72 hours.  Thyroid Function Tests No results for input(s): "TSH", "T4TOTAL", "T3FREE", "THYROIDAB" in the last 72 hours.  Invalid input(s): "FREET3"  Other results:   Imaging    No results found.   Medications:     Scheduled Medications:  aspirin EC  81 mg Oral Daily   atorvastatin  80 mg Oral QHS   Chlorhexidine Gluconate Cloth  6 each Topical Daily   clopidogrel  75 mg Oral Daily   FLUoxetine  80 mg Oral BID   furosemide  40 mg Oral Daily   gabapentin  600 mg Oral QHS   heparin  5,000 Units Subcutaneous Q8H  insulin aspart  0-15 Units Subcutaneous TID WC   insulin aspart  3 Units Subcutaneous TID WC   insulin glargine-yfgn  22 Units Subcutaneous Daily   lidocaine  1 patch Transdermal Q24H   losartan  12.5 mg Oral Daily   metoprolol succinate  25 mg Oral Daily   polyethylene glycol  17 g Oral Daily   sodium chloride flush  10-40 mL Intracatheter Q12H   spironolactone  12.5 mg Oral Daily   traZODone  50 mg Oral QHS    Infusions:  cefTRIAXone (ROCEPHIN)  IV Stopped (03/10/23 1640)    PRN Medications: acetaminophen, bisacodyl, morphine injection, nitroGLYCERIN, ondansetron (ZOFRAN) IV, mouth rinse, oxyCODONE, sodium chloride flush  Patient Profile   Sonia Morales is a 67 y.o. female with past medical history of HFrEF, CAD manifested by STEMI in 2017 s/p PCI to LAD, NSTEMI c/b  cardiogenic shock s/p emergent CABG (LIMA to LAD, SVG to OM) in 2022, and type 2 diabetes.   Now admitted for NSTEMI.   Assessment/Plan   A/C HFrEF - ischemic cardiomyopathy - 3/23 Echo: 40-45% (improved from 35% pre-CABG in 9/22) GIDD, diffuse LV regional wall motion hypokinesis - Echo 11/24  EF 20-25%. Rv nl. Mild MR and TR. - RHC: severely elevated LVEDP 42, PCWP 33, PAP 65/33/47, RA 23, CO/CI 4.39/2.33 - On 6L [place] with fine crackles in bases. 1.3L UOP. Increase PO Lasix to 80 mg. Consider transitioning to Torsemide tomorrow if response not adequate. - Increase spiro to 25 daily - Continue Losartan 12.5  - Continue low-dose Toprol 25 - No SGLT2i with recurrent UTIs   2. CAD with NSTEMI - STEMI in 2017 s/p PCI to LAD, NSTEMI c/b cardiogenic shock s/p emergent CABG (LIMA to LAD, SVG to OM) in 2022 - LHC: critical left main coronary stenosis (mLM to dLM 95%, ost LAD to pLAD 100%, ostCx to pCx 100%), patent LIMA to LAD, patent SVG to OM - No s/s angina - Continue Asprin - Continue Plavix  - Continue Lipitor   3. UTI - history of recurrent UTIs, severe L flank pain - CT 10/11 bilateral non-obstructing renal calculi noted  - Renal US normal without evidence of previous renal calculi - CT 11/22 multiple non-obstructing kidney stones - Ucx > 100K GNR. Ceftriaxone IV 4 days complete. - Start Cipro 500mg  BID 5 day course today, to be continued as outpatient  4. AKI: in the setting of volume overload and UTI - baseline sCr 0.8 - 1.03>1.55>1.39 > 1.37>1.27 - diuresis as above  5. Acute Hypoxic Respiratory Failure - On 6L [place] O2, now on 2L - diuresis as above - Aggressive IS use   6. DMII - SSI per primary team  Length of Stay: 4  [place] Rakesh Dutko, NP  03/11/2023, 8:43 AM  Advanced Heart Failure Team Pager [phone] (M-F; 7a - 5p)  Please contact [facility] for night-coverage after hours (5p -7a ) and weekends on [email or web address]

## 2023-03-11 NOTE — Evaluation (Signed)
Physical Therapy Evaluation Patient Details Name: Sonia Morales MRN: [id number] DOB: Dec 26, 1955 Today's Date: 03/11/2023  History of Present Illness  Pt is a 67 y.o. female presenting 11/20 with chest pain and shortness of breath. S/p heart cath which showed critical left main coronary stenosis. CT chest with partial bilateral lower lobe consolidations. CT abd/pelvis with multiple small non-obstructing kidney stones. PMH signficiant for CAD, STEMI (2017, NSTEMI 2022 s/p CABG, cardiomyopathy with LVEF 40-45%, poorly controlled IDDM2.   Clinical Impression  Sonia Morales is 67 y.o. female admitted with above HPI and diagnosis. Patient is currently limited by functional impairments below (see PT problem list). Patient lives with spouse and is independent with no AD at baseline. Currently pt requires supervision for transfers with RW and CGA fading to supervision for gait with RW. Pt overall steady, noted to desat to mid 80's on 4L/min during ambulation and improved to >92% on 6L/min with gait. At EOS pt agreeable to remain OOB and saturating at 97% on 4L at rest. Pt educated on IS for respiratory exercise at EOS achieving 1125 mL inhalation. Patient will benefit from continued skilled PT interventions to address impairments and progress independence with mobility, recommending OPPT follow up for balance and strengthening. Acute PT will follow and progress as able.         If plan is discharge home, recommend the following:     Can travel by private vehicle        Equipment Recommendations None recommended by PT  Recommendations for Other Services       Functional Status Assessment Patient has had a recent decline in their functional status and demonstrates the ability to make significant improvements in function in a reasonable and predictable amount of time.     Precautions / Restrictions Precautions Precautions: Fall Restrictions Weight Bearing Restrictions: No      Mobility  Bed  Mobility Overal bed mobility: Modified Independent             General bed mobility comments: pt OOB at start and end of session    Transfers Overall transfer level: Needs assistance Equipment used: None Transfers: Sit to/from Stand Sit to Stand: Supervision           General transfer comment: sup for safety with rise and lower from recliner    Ambulation/Gait Ambulation/Gait assistance: Contact guard assist Gait Distance (Feet): 350 Feet Assistive device: Rolling walker (2 wheels) General gait comments: CGA fading to supervision for gait with RW. Pt overall steady, noted to desat to mid 80's on 4L/min during ambulation and improved to >92% on 6L/min with gait.  Gait velocity: decr        Stairs            Wheelchair Mobility     Tilt Bed    Modified Rankin (Stroke Patients Only)       Balance Overall balance assessment: Needs assistance Sitting-balance support: Feet supported Sitting balance-Leahy Scale: Good     Standing balance support: No upper extremity supported, During functional activity Standing balance-Leahy Scale: Fair                               Pertinent Vitals/Pain Pain Assessment Pain Assessment: 0-10 Pain Score: 7  Pain Location: L flank Pain Descriptors / Indicators: Discomfort, Aching Pain Intervention(s): Limited activity within patient's tolerance, Monitored during session, Repositioned    Home Living Family/patient expects to be discharged to:: Private residence  Living Arrangements: Spouse/significant other Available Help at Discharge: Family;Available 24 hours/day Type of Home: House Home Access: Stairs to enter Entrance Stairs-Rails: Right;Left (deck) Entrance Stairs-Number of Steps: 2 front porch, 5 deck   Home Layout: Able to live on main level with bedroom/bathroom;Two level Home Equipment: Cane - single point;Grab bars - tub/shower;Hand held shower head      Prior Function Prior Level of  Function : Independent/Modified Independent;Driving             Mobility Comments: no AD ADLs Comments: Indep in ADL and IADL     Extremity/Trunk Assessment   Upper Extremity Assessment Upper Extremity Assessment: Left hand dominant;Defer to OT evaluation    Lower Extremity Assessment Lower Extremity Assessment: Overall WFL for tasks assessed    Cervical / Trunk Assessment Cervical / Trunk Assessment: Normal  Communication   Communication Communication: No apparent difficulties  Cognition Arousal: Alert Behavior During Therapy: WFL for tasks assessed/performed Overall Cognitive Status: Within Functional Limits for tasks assessed                                          General Comments      Exercises     Assessment/Plan    PT Assessment Patient needs continued PT services  PT Problem List Decreased activity tolerance;Decreased balance;Decreased mobility;Cardiopulmonary status limiting activity;Decreased knowledge of use of DME       PT Treatment Interventions DME instruction;Gait training;Stair training;Functional mobility training;Therapeutic activities;Therapeutic exercise;Balance training;Neuromuscular re-education;Cognitive remediation;Patient/family education    PT Goals (Current goals can be found in the Care Plan section)  Acute Rehab PT Goals Patient Stated Goal: return home, get stronger PT Goal Formulation: With patient Time For Goal Achievement: 03/25/23 Potential to Achieve Goals: Good    Frequency Min 1X/week     Co-evaluation               AM-PAC PT "6 Clicks" Mobility  Outcome Measure Help needed turning from your back to your side while in a flat bed without using bedrails?: None Help needed moving from lying on your back to sitting on the side of a flat bed without using bedrails?: A Little Help needed moving to and from a bed to a chair (including a wheelchair)?: A Little Help needed standing up from a chair  using your arms (e.g., wheelchair or bedside chair)?: A Little Help needed to walk in hospital room?: A Little Help needed climbing 3-5 steps with a railing? : A Little 6 Click Score: 19    End of Session Equipment Utilized During Treatment: Gait belt;Oxygen Activity Tolerance: Patient tolerated treatment well Patient left: in chair;with call bell/phone within reach;with family/visitor present Nurse Communication: Mobility status PT Visit Diagnosis: Difficulty in walking, not elsewhere classified (R26.2)    Time: 4782-9562 PT Time Calculation (min) (ACUTE ONLY): 20 min   Charges:   PT Evaluation $PT Eval Low Complexity: 1 Low   PT General Charges $$ ACUTE PT VISIT: 1 Visit         Wynn Maudlin, DPT Acute Rehabilitation Services Office [phone]  03/11/23 12:35 PM

## 2023-03-11 NOTE — Evaluation (Signed)
Occupational Therapy Evaluation Patient Details Name: Sonia Morales MRN: [id number] DOB: 27-Apr-1955 Today's Date: 03/11/2023   History of Present Illness Pt is a 67 y.o. female presenting 11/20 with chest pain and shortness of breath. S/p heart cath which showed critical left main coronary stenosis. CT chest with partial bilateral lower lobe consolidations. CT abd/pelvis with multiple small non-obstructing kidney stones. PMH signficiant for CAD, STEMI (2017, NSTEMI 2022 s/p CABG, cardiomyopathy with LVEF 40-45%, poorly controlled IDDM2.   Clinical Impression   PTA, pt lived with her spouse and was mod I for ADL and IADL; loves to spend watch her grandchildren in her free time. Upon eval, pt with decreased strength, balance, cardiopulmonary endurance and activity tolerance. Pt performing ADL with up to CGA this session. Will continue to follow acutely, but do not suspect need for follow up OT after discharge.       If plan is discharge home, recommend the following: A little help with walking and/or transfers;A little help with bathing/dressing/bathroom;Assistance with cooking/housework;Help with stairs or ramp for entrance    Functional Status Assessment  Patient has had a recent decline in their functional status and demonstrates the ability to make significant improvements in function in a reasonable and predictable amount of time.  Equipment Recommendations  Tub/shower seat    Recommendations for Other Services PT consult     Precautions / Restrictions Precautions Precautions: Fall Restrictions Weight Bearing Restrictions: No      Mobility Bed Mobility Overal bed mobility: Modified Independent                  Transfers Overall transfer level: Needs assistance Equipment used: None Transfers: Sit to/from Stand Sit to Stand: Supervision           General transfer comment: CGA initially progressing to supervision by end of session      Balance Overall balance  assessment: Needs assistance Sitting-balance support: Feet supported Sitting balance-Leahy Scale: Good     Standing balance support: No upper extremity supported, During functional activity Standing balance-Leahy Scale: Fair                             ADL either performed or assessed with clinical judgement   ADL Overall ADL's : Needs assistance/impaired Eating/Feeding: Modified independent;Sitting   Grooming: Wash/dry hands;Standing;Contact guard assist   Upper Body Bathing: Set up;Sitting   Lower Body Bathing: Sit to/from stand;Supervison/ safety   Upper Body Dressing : Set up;Sitting   Lower Body Dressing: Contact guard assist;Sit to/from stand   Toilet Transfer: Contact guard assist   Toileting- Clothing Manipulation and Hygiene: Supervision/safety       Functional mobility during ADLs: Contact guard assist       Vision Baseline Vision/History: 1 Wears glasses Ability to See in Adequate Light: 0 Adequate Patient Visual Report: No change from baseline Vision Assessment?: No apparent visual deficits     Perception Perception: Not tested       Praxis Praxis: Not tested       Pertinent Vitals/Pain Pain Assessment Pain Assessment: 0-10 Pain Score: 7  Pain Location: L flank Pain Descriptors / Indicators: Discomfort, Aching Pain Intervention(s): Limited activity within patient's tolerance, Monitored during session     Extremity/Trunk Assessment Upper Extremity Assessment Upper Extremity Assessment: Left hand dominant;Generalized weakness   Lower Extremity Assessment Lower Extremity Assessment: Defer to PT evaluation       Communication Communication Communication: No apparent difficulties   Cognition Arousal:  Alert Behavior During Therapy: WFL for tasks assessed/performed Overall Cognitive Status: Within Functional Limits for tasks assessed                                       General Comments  VSS on 4L for short  distance mobility. initally on 6L on arrival with pt initiating conversation regarding whether home O2 would be needed and stating she does not want to need it. Satting at 98% on 6L after mobility to restroom; decresed to 4L and >94% during hand hygiene and walking to recliner on other side of room    Exercises     Shoulder Instructions      Home Living Family/patient expects to be discharged to:: Private residence Living Arrangements: Spouse/significant other Available Help at Discharge: Family;Available 24 hours/day Type of Home: House Home Access: Stairs to enter [employer] of Steps: 2 front porch, 5 deck Entrance Stairs-Rails: Right;Left (deck) Home Layout: Able to live on main level with bedroom/bathroom;Two level     Bathroom Shower/Tub: Chief Strategy Officer: Standard     Home Equipment: Cane - single point;Grab bars - tub/shower;Hand held shower head          Prior Functioning/Environment Prior Level of Function : Independent/Modified Independent;Driving             Mobility Comments: no AD ADLs Comments: Indep in ADL and IADL        OT Problem List: Decreased strength;Decreased activity tolerance;Impaired balance (sitting and/or standing);Decreased knowledge of use of DME or AE;Cardiopulmonary status limiting activity      OT Treatment/Interventions: Self-care/ADL training;Therapeutic exercise;DME and/or AE instruction;Balance training;Patient/family education;Therapeutic activities    OT Goals(Current goals can be found in the care plan section) Acute Rehab OT Goals Patient Stated Goal: go home before thanksgiving OT Goal Formulation: With patient Time For Goal Achievement: 03/25/23 Potential to Achieve Goals: Good  OT Frequency: Min 1X/week    Co-evaluation              AM-PAC OT "6 Clicks" Daily Activity     Outcome Measure Help from another person eating meals?: None Help from another person taking care of personal  grooming?: A Little Help from another person toileting, which includes using toliet, bedpan, or urinal?: A Little Help from another person bathing (including washing, rinsing, drying)?: A Little Help from another person to put on and taking off regular upper body clothing?: A Little Help from another person to put on and taking off regular lower body clothing?: A Little 6 Click Score: 19   End of Session Equipment Utilized During Treatment: Gait belt Nurse Communication: Mobility status  Activity Tolerance: Patient tolerated treatment well Patient left: in chair;with call bell/phone within reach;with chair alarm set  OT Visit Diagnosis: Unsteadiness on feet (R26.81);Muscle weakness (generalized) (M62.81)                Time: 1610-9604 OT Time Calculation (min): 27 min Charges:  OT General Charges $OT Visit: 1 Visit OT Evaluation $OT Eval Low Complexity: 1 Low OT Treatments $Self Care/Home Management : 8-22 mins  Tyler Deis, OTR/L [facility] Acute Rehabilitation Office: [phone]   Myrla Halsted 03/11/2023, 9:15 AM

## 2023-03-11 NOTE — Care Management Important Message (Signed)
Important Message  Patient Details  Name: KERINA LAMOREUX MRN: [id number] Date of Birth: December 23, 1955   Important Message Given:  Yes - Medicare IM     Sherilyn Banker 03/11/2023, 3:59 PM

## 2023-03-11 NOTE — Progress Notes (Signed)
Sonia Morales  ZOX:[id number] DOB: 08/08/1955 DOA: 03/06/2023 PCP: Richardean Chimera, MD    Brief Narrative:  67 year old with a complex history of CAD to include STEMI 2017 status post LAD PCI and then non-STEMI with cardiogenic shock 2022 status post emergent CABG x 2, ischemic cardiomyopathy with a EF 40-45%, uncontrolled DM2, and HLD who presented to the ER 11/20 with chest pain accompanied by 1 to 2 days of dyspnea on exertion and orthopnea.  She was found to be hypoxic on arrival at 82% on room air.  Goals of Care:   Code Status: Full Code   DVT prophylaxis: heparin injection 5,000 Units Start: 03/07/23 0600   Interim Hx: No acute events recorded overnight.  Afebrile.  Vital signs stable with systolics modestly low at 93-104.  Oxygen saturation 87-97% on 6 L conventional nasal cannula.  Reports that she is feeling much better overall.  No new complaints today.  Is anxious to be discharged home.  But  Assessment & Plan:  Acute exacerbation of chronic systolic CHF -ischemic cardiomyopathy Management per Advanced Heart Failure Team -diuresis ongoing -TTE this admission noted EF 20-25% with mild MR and TR and normal RV compared to EF 40-45% on prior echo March 2023 -right heart cath 03/06/2023 revealed severely elevated LVEDP -net negative approximately 7.5 L since admission   Filed Weights   03/09/23 0500 03/10/23 0500 03/11/23 0500  Weight: 83.9 kg 84.4 kg 83.3 kg     CAD with acute NSTEMI Cardiac cath 03/06/2023 revealed no focal targets for PCI with critical left main stenosis but LAD and OM filling from patent bypass grafts -note was made of a small to moderate caliber intermediate branch that was not grafted -continue aspirin and Plavix per cardiology -continue statin  Acute hypoxic respiratory failure Felt to primarily be due to pulmonary edema -diuresis ongoing -CT chest noted bilateral lower lobe consolidations consistent with atelectasis versus infiltrate -no clinical  evidence to suggest active pulmonary infection  Uncontrolled DM2 with hyperglycemia A1c 8.0 -CBG remains elevated -adjust treatment again today and follow  History of recurrent UTIs - left flank pain - E. coli UTI POA UA consistent with UTI -renal ultrasound noted no hydronephrosis -CT abdomen confirmed multiple small nonobstructing kidney stones - no SGLT2i as a result  Acute kidney injury Baseline creatinine 0.8 -creatinine peaked at 1.5 -continues to improve with diuresis  Recent Labs  Lab 03/08/23 0209 03/08/23 1402 03/09/23 0426 03/10/23 0458 03/11/23 0434  CREATININE 1.36* 1.59* 1.39* 1.37* 1.27*    Normocytic anemia Anemia panel unremarkable - no evidence of gross blood loss  Family Communication: No family present at time of exam Disposition: Anticipate discharge home  - PT/OT suggest outpatient therapy   Objective: Blood pressure 104/61, pulse 89, temperature 98.2 F (36.8 C), temperature source Oral, resp. rate 20, height 5\' 3"  (1.6 m), weight 83.3 kg, SpO2 97%.  Intake/Output Summary (Last 24 hours) at 03/11/2023 0939 Last data filed at 03/10/2023 2000 Gross per 24 hour  Intake 578.34 ml  Output 1300 ml  Net -721.66 ml   Filed Weights   03/09/23 0500 03/10/23 0500 03/11/23 0500  Weight: 83.9 kg 84.4 kg 83.3 kg    Examination: General: No acute respiratory distress Lungs: Clear to auscultation bilaterally without wheezes or crackles Cardiovascular: Regular rate and rhythm without murmur gallop or rub normal S1 and S2 Abdomen: Nontender, nondistended, soft, bowel sounds positive, no rebound, no ascites, no appreciable mass Extremities:   CBC: Recent Labs  Lab 03/09/23 0426  03/10/23 0458 03/11/23 0434  WBC 9.1 7.0 7.2  HGB 12.1 11.0* 11.2*  HCT 36.5 33.5* 34.3*  MCV 89.0 88.4 90.0  PLT 256 247 255   Basic Metabolic Panel: Recent Labs  Lab 03/08/23 0209 03/08/23 1402 03/09/23 0426 03/10/23 0458 03/11/23 0434  NA 136   < > 135 134* 134*  K  4.0   < > 4.3 3.8 4.0  CL 95*   < > 89* 96* 95*  CO2 29   < > 34* 28 28  GLUCOSE 128*   < > 215* 241* 228*  BUN 40*   < > 43* 43* 46*  CREATININE 1.36*   < > 1.39* 1.37* 1.27*  CALCIUM 9.0   < > 9.8 9.0 9.3  MG 1.9  --   --   --   --    < > = values in this interval not displayed.   GFR: Estimated Creatinine Clearance: 45.2 mL/min (A) (by C-G formula based on SCr of 1.27 mg/dL (H)).   Scheduled Meds:  aspirin EC  81 mg Oral Daily   atorvastatin  80 mg Oral QHS   Chlorhexidine Gluconate Cloth  6 each Topical Daily   clopidogrel  75 mg Oral Daily   FLUoxetine  80 mg Oral BID   furosemide  40 mg Oral Daily   furosemide  40 mg Oral Once   gabapentin  600 mg Oral QHS   heparin  5,000 Units Subcutaneous Q8H   insulin aspart  0-15 Units Subcutaneous TID WC   insulin aspart  3 Units Subcutaneous TID WC   insulin glargine-yfgn  22 Units Subcutaneous Daily   lidocaine  1 patch Transdermal Q24H   losartan  12.5 mg Oral Daily   metoprolol succinate  25 mg Oral Daily   polyethylene glycol  17 g Oral Daily   sodium chloride flush  10-40 mL Intracatheter Q12H   [START ON 03/12/2023] spironolactone  25 mg Oral Daily   traZODone  50 mg Oral QHS   Continuous Infusions:  cefTRIAXone (ROCEPHIN)  IV Stopped (03/10/23 1640)     LOS: 4 days   Lonia Blood, MD [facility] Office  [phone] Pager - Text Page per Loretha Stapler  If 7PM-7AM, please contact night-coverage per Amion 03/11/2023, 9:39 AM

## 2023-03-11 NOTE — Progress Notes (Addendum)
Advanced Heart Failure Rounding Note  PCP-Cardiologist: None   Subjective:    1.3L UOP on PO Lasix. On 6L [place]. Encouraged aggressive IS use.  Sitting up in chair, eating breakfast. No CP or SOB. L flank pain improved, although still present at times.   Objective:   Weight Range: 83.3 kg Body mass index is 32.52 kg/m.   Vital Signs:   Temp:  [97.7 F (36.5 C)-98.6 F (37 C)] 98.2 F (36.8 C) (11/25 0744) Pulse Rate:  [84-102] 89 (11/25 0744) Resp:  [16-26] 20 (11/25 0814) BP: (93-127)/(58-96) 104/61 (11/25 0814) SpO2:  [92 %-99 %] 97 % (11/25 0744) Weight:  [83.3 kg] 83.3 kg (11/25 0500) Last BM Date : 03/10/23  Weight change: Filed Weights   03/09/23 0500 03/10/23 0500 03/11/23 0500  Weight: 83.9 kg 84.4 kg 83.3 kg    Intake/Output:   Intake/Output Summary (Last 24 hours) at 03/11/2023 0843 Last data filed at 03/10/2023 2000 Gross per 24 hour  Intake 578.34 ml  Output 1300 ml  Net -721.66 ml    Physical Exam    General: Sitting up in chair. No distress on 6L [place]. HEENT: neck supple.   Cardiac: JVP 8cm. S1 and S2 present. No murmurs or rub. Resp: Lung sounds clear uppers and fine crackles in the lowers Abdomen: Obese. Soft, non-tender, non-distended. + BS. Extremities: Warm and dry. No rash, cyanosis.  No edema.  Neuro: Alert and oriented x3. Affect pleasant. Moves all extremities without difficulty.  Telemetry   SR in 80s (personally reviewed)  Labs    CBC Recent Labs    03/10/23 0458 03/11/23 0434  WBC 7.0 7.2  HGB 11.0* 11.2*  HCT 33.5* 34.3*  MCV 88.4 90.0  PLT 247 255   Basic Metabolic Panel Recent Labs    40/98/11 0458 03/11/23 0434  NA 134* 134*  K 3.8 4.0  CL 96* 95*  CO2 28 28  GLUCOSE 241* 228*  BUN 43* 46*  CREATININE 1.37* 1.27*  CALCIUM 9.0 9.3   Liver Function Tests Recent Labs    03/09/23 0426 03/10/23 0458  AST 36 22  ALT 50* 35  ALKPHOS 80 75  BILITOT 0.3 0.2  PROT 7.7 6.8  ALBUMIN 3.4* 3.0*   No  results for input(s): "LIPASE", "AMYLASE" in the last 72 hours. Cardiac Enzymes No results for input(s): "CKTOTAL", "CKMB", "CKMBINDEX", "TROPONINI" in the last 72 hours.  BNP: BNP (last 3 results) Recent Labs    03/06/23 0814  BNP 2,236.0*    ProBNP (last 3 results) No results for input(s): "PROBNP" in the last 8760 hours.   D-Dimer No results for input(s): "DDIMER" in the last 72 hours.  Hemoglobin A1C No results for input(s): "HGBA1C" in the last 72 hours.  Fasting Lipid Panel No results for input(s): "CHOL", "HDL", "LDLCALC", "TRIG", "CHOLHDL", "LDLDIRECT" in the last 72 hours.  Thyroid Function Tests No results for input(s): "TSH", "T4TOTAL", "T3FREE", "THYROIDAB" in the last 72 hours.  Invalid input(s): "FREET3"  Other results:   Imaging    No results found.   Medications:     Scheduled Medications:  aspirin EC  81 mg Oral Daily   atorvastatin  80 mg Oral QHS   Chlorhexidine Gluconate Cloth  6 each Topical Daily   clopidogrel  75 mg Oral Daily   FLUoxetine  80 mg Oral BID   furosemide  40 mg Oral Daily   gabapentin  600 mg Oral QHS   heparin  5,000 Units Subcutaneous Q8H  insulin aspart  0-15 Units Subcutaneous TID WC   insulin aspart  3 Units Subcutaneous TID WC   insulin glargine-yfgn  22 Units Subcutaneous Daily   lidocaine  1 patch Transdermal Q24H   losartan  12.5 mg Oral Daily   metoprolol succinate  25 mg Oral Daily   polyethylene glycol  17 g Oral Daily   sodium chloride flush  10-40 mL Intracatheter Q12H   spironolactone  12.5 mg Oral Daily   traZODone  50 mg Oral QHS    Infusions:  cefTRIAXone (ROCEPHIN)  IV Stopped (03/10/23 1640)    PRN Medications: acetaminophen, bisacodyl, morphine injection, nitroGLYCERIN, ondansetron (ZOFRAN) IV, mouth rinse, oxyCODONE, sodium chloride flush  Patient Profile   Sonia Morales is a 67 y.o. female with past medical history of HFrEF, CAD manifested by STEMI in 2017 s/p PCI to LAD, NSTEMI c/b  cardiogenic shock s/p emergent CABG (LIMA to LAD, SVG to OM) in 2022, and type 2 diabetes.   Now admitted for NSTEMI.   Assessment/Plan   A/C HFrEF - ischemic cardiomyopathy - 3/23 Echo: 40-45% (improved from 35% pre-CABG in 9/22) GIDD, diffuse LV regional wall motion hypokinesis - Echo 11/24  EF 20-25%. Rv nl. Mild MR and TR. - RHC: severely elevated LVEDP 42, PCWP 33, PAP 65/33/47, RA 23, CO/CI 4.39/2.33 - On 6L [place] with fine crackles in bases. 1.3L UOP. Increase PO Lasix to 80 mg. Consider transitioning to Torsemide tomorrow if response not adequate. - Increase spiro to 25 daily - Continue Losartan 12.5  - Continue low-dose Toprol 25 - No SGLT2i with recurrent UTIs   2. CAD with NSTEMI - STEMI in 2017 s/p PCI to LAD, NSTEMI c/b cardiogenic shock s/p emergent CABG (LIMA to LAD, SVG to OM) in 2022 - LHC: critical left main coronary stenosis (mLM to dLM 95%, ost LAD to pLAD 100%, ostCx to pCx 100%), patent LIMA to LAD, patent SVG to OM - No s/s angina - Continue Asprin - Continue Plavix  - Continue Lipitor   3. UTI - history of recurrent UTIs, severe L flank pain - CT 10/11 bilateral non-obstructing renal calculi noted  - Renal US normal without evidence of previous renal calculi - CT 11/22 multiple non-obstructing kidney stones - Ucx > 100K GNR. Ceftriaxone IV 4 days complete. - Start Cipro 500mg  BID 5 day course today, to be continued as outpatient  4. AKI: in the setting of volume overload and UTI - baseline sCr 0.8 - 1.03>1.55>1.39 > 1.37>1.27 - diuresis as above  5. Acute Hypoxic Respiratory Failure - On 6L [place] O2, now on 2L - diuresis as above - Aggressive IS use   6. DMII - SSI per primary team  Length of Stay: 4  Sonia Dysen Edmondson, NP  03/11/2023, 8:43 AM  Advanced Heart Failure Team Pager [phone] (M-F; 7a - 5p)  Please contact [facility] for night-coverage after hours (5p -7a ) and weekends on [email or web address]

## 2023-03-11 NOTE — Progress Notes (Signed)
REDS Clip  READING (normal 20-35%) = 41  CHEST RULER (in) =27 Clip Station =  A  Rhae Hammock, BSN, Scientist, clinical (histocompatibility and immunogenetics) Only

## 2023-03-11 NOTE — TOC Progression Note (Signed)
Transition of Care [facility]) - Progression Note    Patient Details  Name: Sonia Morales MRN: [id number] Date of Birth: 12-08-1955  Transition of Care [facility]) CM/SW Contact  Nicanor Bake Phone Number: [phone] 03/11/2023, 3:11 PM  Clinical Narrative:   HF CSW met with pt and sister at bedside. Pt stated that she was feeling ok. Pt and sister stated that they are eager to get back home and inquired about potential dc date. Pts sister asked once we know a date if she can be notified.   CSW attempted to schedule pts hospital PCP follow up appointment, but the number on file is disconnected.   TOC will continue following.     Expected Discharge Plan: Home w Home Health Services Barriers to Discharge: Continued Medical Work up  Expected Discharge Plan and Services   Discharge Planning Services: CM Consult Post Acute Care Choice: Home Health Living arrangements for the past 2 months: Single Family Home                             HH Agency: [facility] Date [facility] Agency Contacted: 03/08/23 Time HH Agency Contacted: 1735 Representative spoke with at [facility] Agency: Kandee Keen   Social Determinants of Health (SDOH) Interventions SDOH Screenings   Food Insecurity: No Food Insecurity (03/07/2023)  Housing: Low Risk  (03/07/2023)  Transportation Needs: No Transportation Needs (03/07/2023)  Utilities: Not At Risk (03/07/2023)  Alcohol Screen: Low Risk  (01/04/2021)  Depression (PHQ2-9): Medium Risk (03/23/2021)  Financial Resource Strain: Low Risk  (01/04/2021)  Tobacco Use: Medium Risk (03/07/2023)    Readmission Risk Interventions     No data to display

## 2023-03-12 ENCOUNTER — Encounter (HOSPITAL_COMMUNITY)
Admission: EM | Disposition: A | Discharge status: Home / Self Care | Payer: Self-pay | Source: Home / Self Care | Attending: Internal Medicine

## 2023-03-12 ENCOUNTER — Inpatient Hospital Stay (HOSPITAL_COMMUNITY): Payer: Medicare Other

## 2023-03-12 DIAGNOSIS — I214 Non-ST elevation (NSTEMI) myocardial infarction: Secondary | ICD-10-CM | POA: Diagnosis not present

## 2023-03-12 DIAGNOSIS — I5023 Acute on chronic systolic (congestive) heart failure: Secondary | ICD-10-CM | POA: Diagnosis not present

## 2023-03-12 HISTORY — PX: RIGHT HEART CATH: CATH118263

## 2023-03-12 LAB — GLUCOSE, CAPILLARY
Glucose-Capillary: 214 mg/dL — ABNORMAL HIGH (ref 70–99)
Glucose-Capillary: 196 mg/dL — ABNORMAL HIGH (ref 70–99)
Glucose-Capillary: 241 mg/dL — ABNORMAL HIGH (ref 70–99)
Glucose-Capillary: 337 mg/dL — ABNORMAL HIGH (ref 70–99)

## 2023-03-12 LAB — CBC
HCT: 33.7 % — ABNORMAL LOW (ref 36.0–46.0)
Hemoglobin: 11.1 g/dL — ABNORMAL LOW (ref 12.0–15.0)
MCH: 29.1 pg (ref 26.0–34.0)
MCHC: 32.9 g/dL (ref 30.0–36.0)
MCV: 88.2 fL (ref 80.0–100.0)
Platelets: 272 10*3/uL (ref 150–400)
RBC: 3.82 MIL/uL — ABNORMAL LOW (ref 3.87–5.11)
RDW: 12.8 % (ref 11.5–15.5)
WBC: 9 10*3/uL (ref 4.0–10.5)
nRBC: 0 % (ref 0.0–0.2)

## 2023-03-12 LAB — POCT I-STAT 7, (LYTES, BLD GAS, ICA,H+H)
Acid-Base Excess: 5 mmol/L — ABNORMAL HIGH (ref 0.0–2.0)
Acid-Base Excess: 6 mmol/L — ABNORMAL HIGH (ref 0.0–2.0)
Bicarbonate: 30.6 mmol/L — ABNORMAL HIGH (ref 20.0–28.0)
Bicarbonate: 31.4 mmol/L — ABNORMAL HIGH (ref 20.0–28.0)
Calcium, Ion: 1.18 mmol/L (ref 1.15–1.40)
Calcium, Ion: 1.19 mmol/L (ref 1.15–1.40)
HCT: 36 % (ref 36.0–46.0)
HCT: 36 % (ref 36.0–46.0)
Hemoglobin: 12.2 g/dL (ref 12.0–15.0)
Hemoglobin: 12.2 g/dL (ref 12.0–15.0)
O2 Saturation: 57 %
O2 Saturation: 57 %
Potassium: 4.1 mmol/L (ref 3.5–5.1)
Potassium: 4.1 mmol/L (ref 3.5–5.1)
Sodium: 132 mmol/L — ABNORMAL LOW (ref 135–145)
Sodium: 133 mmol/L — ABNORMAL LOW (ref 135–145)
TCO2: 32 mmol/L (ref 22–32)
TCO2: 33 mmol/L — ABNORMAL HIGH (ref 22–32)
pCO2 arterial: 47.9 mm[Hg] (ref 32–48)
pCO2 arterial: 48.3 mm[Hg] — ABNORMAL HIGH (ref 32–48)
pH, Arterial: 7.413 (ref 7.35–7.45)
pH, Arterial: 7.421 (ref 7.35–7.45)
pO2, Arterial: 30 mm[Hg] — CL (ref 83–108)
pO2, Arterial: 30 mm[Hg] — CL (ref 83–108)

## 2023-03-12 LAB — BASIC METABOLIC PANEL
Anion gap: 13 (ref 5–15)
BUN: 54 mg/dL — ABNORMAL HIGH (ref 8–23)
CO2: 26 mmol/L (ref 22–32)
Calcium: 9.3 mg/dL (ref 8.9–10.3)
Chloride: 93 mmol/L — ABNORMAL LOW (ref 98–111)
Creatinine, Ser: 1.31 mg/dL — ABNORMAL HIGH (ref 0.44–1.00)
GFR, Estimated: 45 mL/min — ABNORMAL LOW (ref >60)
Glucose, Bld: 181 mg/dL — ABNORMAL HIGH (ref 70–99)
Potassium: 3.9 mmol/L (ref 3.5–5.1)
Sodium: 132 mmol/L — ABNORMAL LOW (ref 135–145)

## 2023-03-12 LAB — SODIUM, URINE, RANDOM: Sodium, Ur: 95 mmol/L

## 2023-03-12 LAB — COOXEMETRY PANEL
Carboxyhemoglobin: 1.5 % (ref 0.5–1.5)
Carboxyhemoglobin: 1.1 % (ref 0.5–1.5)
Methemoglobin: <0.7 % (ref 0.0–1.5)
Methemoglobin: <0.7 % (ref 0.0–1.5)
O2 Saturation: 54.7 %
O2 Saturation: 46 %
Total hemoglobin: 11.6 g/dL — ABNORMAL LOW (ref 12.0–16.0)
Total hemoglobin: 11.3 g/dL — ABNORMAL LOW (ref 12.0–16.0)

## 2023-03-12 SURGERY — RIGHT HEART CATH
Anesthesia: LOCAL

## 2023-03-12 MED ORDER — METOPROLOL SUCCINATE ER 25 MG PO TB24
12.5000 mg | ORAL_TABLET | Freq: Every day | ORAL | Status: DC
Start: 2023-03-13 — End: 2023-03-12

## 2023-03-12 MED ORDER — FUROSEMIDE 10 MG/ML IJ SOLN
80.0000 mg | Freq: Two times a day (BID) | INTRAMUSCULAR | Status: DC
  Administered 2023-03-12: 80 mg via INTRAVENOUS
  Filled 2023-03-12: qty 8

## 2023-03-12 MED ORDER — SODIUM CHLORIDE 0.9% FLUSH: 10.0000 mL | Freq: Two times a day (BID) | INTRAVENOUS | Status: DC

## 2023-03-12 MED ORDER — LOSARTAN POTASSIUM 25 MG PO TABS
12.5000 mg | ORAL_TABLET | Freq: Every day | ORAL | Status: DC
  Filled 2023-03-12: qty 1

## 2023-03-12 MED ORDER — INSULIN GLARGINE-YFGN 100 UNIT/ML ~~LOC~~ SOLN
36.0000 [IU] | Freq: Every day | SUBCUTANEOUS | Status: DC
  Administered 2023-03-13: 36 [IU] via SUBCUTANEOUS
  Filled 2023-03-12: qty 0.36

## 2023-03-12 MED ORDER — POTASSIUM CHLORIDE CRYS ER 20 MEQ PO TBCR
20.0000 meq | EXTENDED_RELEASE_TABLET | Freq: Once | ORAL | Status: AC
  Administered 2023-03-12: 20 meq via ORAL
  Filled 2023-03-12: qty 1

## 2023-03-12 MED ORDER — HEPARIN (PORCINE) IN NACL 1000-0.9 UT/500ML-% IV SOLN
INTRAVENOUS | Status: DC | PRN
  Administered 2023-03-12: 500 mL

## 2023-03-12 MED ORDER — LIDOCAINE HCL (PF) 1 % IJ SOLN
INTRAMUSCULAR | Status: DC | PRN
  Administered 2023-03-12: 2 mL

## 2023-03-12 MED ORDER — LIDOCAINE HCL (PF) 1 % IJ SOLN
INTRAMUSCULAR | Status: AC
  Filled 2023-03-12: qty 30

## 2023-03-12 MED ORDER — INSULIN GLARGINE-YFGN 100 UNIT/ML ~~LOC~~ SOLN
6.0000 [IU] | Freq: Once | SUBCUTANEOUS | Status: AC
  Administered 2023-03-12: 6 [IU] via SUBCUTANEOUS
  Filled 2023-03-12: qty 0.06

## 2023-03-12 MED ORDER — INSULIN GLARGINE-YFGN 100 UNIT/ML ~~LOC~~ SOLN: 8.0000 [IU] | Freq: Once | SUBCUTANEOUS | Status: DC

## 2023-03-12 SURGICAL SUPPLY — 3 items
CATH BALLN WEDGE 5F 110CM (CATHETERS) IMPLANT
PACK CARDIAC CATHETERIZATION (CUSTOM PROCEDURE TRAY) ×1 IMPLANT
SHEATH GLIDE SLENDER 4/5FR (SHEATH) IMPLANT

## 2023-03-12 NOTE — Progress Notes (Addendum)
Advanced Heart Failure Rounding Note  PCP-Cardiologist: None   Subjective:    Co-ox 55. Soft Bps overnight 2 Unmeasured urine occurrences overnight, need strict I/Os. Weight continues to climb.  CXR with worsening atelectasis in RLL and pulmonary edema although improved from 11/20. ReDs 41%  Feeling well this morning on 2L [place]. Mild DOE. No CP. L flank pain improved.   Objective:   Weight Range: 84.6 kg Body mass index is 33.02 kg/m.   Vital Signs:   Temp:  [97.7 F (36.5 C)-98.6 F (37 C)] 98.6 F (37 C) (11/26 0409) Pulse Rate:  [79-87] 84 (11/26 0821) Resp:  [16-20] 16 (11/26 0409) BP: (88-98)/(51-84) 94/55 (11/26 0821) SpO2:  [91 %-94 %] 91 % (11/26 0409) Weight:  [84.6 kg] 84.6 kg (11/26 0409) Last BM Date : 03/10/23  Weight change: Filed Weights   03/10/23 0500 03/11/23 0500 03/12/23 0409  Weight: 84.4 kg 83.3 kg 84.6 kg    Intake/Output:   Intake/Output Summary (Last 24 hours) at 03/12/2023 0933 Last data filed at 03/11/2023 2245 Gross per 24 hour  Intake 490 ml  Output --  Net 490 ml    Physical Exam    CVP 6. (Does not match clinical picture) General: Well appearing. No distress on RA HEENT: neck supple.   Cardiac: JVP not visible. S1 and S2 present. No murmurs or rub. Resp: Lung sounds with fine cracles Abdomen: Soft, non-tender, distended. Hypoactive BS. Central edema. Extremities: Warm and dry. No rash, cyanosis.  Neuro: Alert and oriented x3. Affect pleasant. Moves all extremities without difficulty. Lines/Devices:  RUE PICC  Telemetry   SR 80s (personally reviewed)  Labs    CBC Recent Labs    03/11/23 0434 03/12/23 0405  WBC 7.2 9.0  HGB 11.2* 11.1*  HCT 34.3* 33.7*  MCV 90.0 88.2  PLT 255 272   Basic Metabolic Panel Recent Labs    16/10/96 0434 03/12/23 0405  NA 134* 132*  K 4.0 3.9  CL 95* 93*  CO2 28 26  GLUCOSE 228* 181*  BUN 46* 54*  CREATININE 1.27* 1.31*  CALCIUM 9.3 9.3   Liver Function Tests Recent  Labs    03/10/23 0458  AST 22  ALT 35  ALKPHOS 75  BILITOT 0.2  PROT 6.8  ALBUMIN 3.0*   No results for input(s): "LIPASE", "AMYLASE" in the last 72 hours. Cardiac Enzymes No results for input(s): "CKTOTAL", "CKMB", "CKMBINDEX", "TROPONINI" in the last 72 hours.  BNP: BNP (last 3 results) Recent Labs    03/06/23 0814  BNP 2,236.0*    ProBNP (last 3 results) No results for input(s): "PROBNP" in the last 8760 hours.   D-Dimer No results for input(s): "DDIMER" in the last 72 hours.  Hemoglobin A1C No results for input(s): "HGBA1C" in the last 72 hours.  Fasting Lipid Panel No results for input(s): "CHOL", "HDL", "LDLCALC", "TRIG", "CHOLHDL", "LDLDIRECT" in the last 72 hours.  Thyroid Function Tests No results for input(s): "TSH", "T4TOTAL", "T3FREE", "THYROIDAB" in the last 72 hours.  Invalid input(s): "FREET3"  Other results:  Imaging   DG CHEST PORT 1 VIEW  Result Date: 03/12/2023 CLINICAL DATA:  141880 SOB (shortness of breath) 141880. EXAM: PORTABLE CHEST 1 VIEW COMPARISON:  Chest radiograph 03/06/2023. FINDINGS: Interval insertion of the right upper extremity PICC with tip projecting over the superior cavoatrial junction. Improved pulmonary edema. No consolidation. Low lung volumes accentuate the cardiomediastinal silhouette and pulmonary vasculature. Postoperative changes of median sternotomy and CABG. Partially visualized cervical spinal fusion  hardware. No pleural effusion or pneumothorax. IMPRESSION: 1. Interval insertion of the right upper extremity PICC with tip projecting over the superior cavoatrial junction. 2. Improved pulmonary edema. Electronically Signed   By: Orvan Falconer M.D.   On: 03/12/2023 09:15    Medications:    Scheduled Medications:  aspirin EC  81 mg Oral Daily   atorvastatin  80 mg Oral QHS   Chlorhexidine Gluconate Cloth  6 each Topical Daily   ciprofloxacin  500 mg Oral BID   clopidogrel  75 mg Oral Daily   FLUoxetine  80 mg Oral  BID   furosemide  80 mg Intravenous BID   gabapentin  600 mg Oral QHS   heparin  5,000 Units Subcutaneous Q8H   insulin aspart  0-15 Units Subcutaneous TID WC   insulin aspart  3 Units Subcutaneous TID WC   insulin glargine-yfgn  30 Units Subcutaneous Daily   lidocaine  1 patch Transdermal Q24H   [START ON 03/13/2023] metoprolol succinate  12.5 mg Oral Daily   polyethylene glycol  17 g Oral Daily   sodium chloride flush  10-40 mL Intracatheter Q12H   spironolactone  25 mg Oral Daily   traZODone  50 mg Oral QHS    Infusions:    PRN Medications: acetaminophen, bisacodyl, morphine injection, nitroGLYCERIN, ondansetron (ZOFRAN) IV, mouth rinse, oxyCODONE, sodium chloride flush  Patient Profile   Mrs. Wilkerson is a 67 y.o. female with past medical history of HFrEF, CAD manifested by STEMI in 2017 s/p PCI to LAD, NSTEMI c/b cardiogenic shock s/p emergent CABG (LIMA to LAD, SVG to OM) in 2022, and type 2 diabetes.   Now admitted for NSTEMI.   Assessment/Plan   A/C HFrEF - ischemic cardiomyopathy - 3/23 Echo: 40-45% (improved from 35% pre-CABG in 9/22) GIDD, diffuse LV regional wall motion hypokinesis - Echo 11/24  EF 20-25%. Rv nl. Mild MR and TR. - RHC: severely elevated LVEDP 42, PCWP 33, PAP 65/33/47, RA 23, CO/CI 4.39/2.33 - Volume status difficult to assess. CVP not matching clinical picture. ReDs 41. Weight continues to climb. Mild pulm edema on CXR. 2x urine occurrences overnight. Need to obtain strict I/Os. Could repeat RHC if volume status continues to be questionable. - Torsemide 60 po this am. Urine Na pending. Will need more aggressive diuresis given poor response to PO.  Cleda Daub to 25 daily - Hold Losartan 12.5 with hypotension  - Hold low-dose Toprol 25 with hypotension - No SGLT2i with recurrent UTIs   2. CAD with NSTEMI - STEMI in 2017 s/p PCI to LAD, NSTEMI c/b cardiogenic shock s/p emergent CABG (LIMA to LAD, SVG to OM) in 2022 - LHC: critical left main coronary  stenosis (mLM to dLM 95%, ost LAD to pLAD 100%, ostCx to pCx 100%), patent LIMA to LAD, patent SVG to OM - No s/s angina - Continue Asprin - Continue Plavix  - Continue Lipitor   3. UTI - history of recurrent UTIs, severe L flank pain - CT 10/11 bilateral non-obstructing renal calculi noted  - Renal US normal without evidence of previous renal calculi - CT 11/22 multiple non-obstructing kidney stones - Ucx > 100K GNR. Ceftriaxone IV 4 days complete. - Cipro 500mg  BID 5 day course   4. AKI: in the setting of volume overload and UTI - baseline sCr 0.8 - 1.03>1.55>1.39 > 1.37>1.27>1.31 - diuresis as above  5. Acute Hypoxic Respiratory Failure - RA to 2L - CXR today with worsening RLL atelectasis with pulm edema - diuresis as  above - Aggressive IS use   6. DMII - SSI per primary team  Length of Stay: 5  [place] Jaythen Hamme, NP  03/12/2023, 9:33 AM  Advanced Heart Failure Team Pager [phone] (M-F; 7a - 5p)  Please contact [facility] for night-coverage after hours (5p -7a ) and weekends on [email or web address]

## 2023-03-12 NOTE — Inpatient Diabetes Management (Signed)
Inpatient Diabetes Program Recommendations  AACE/ADA: New Consensus Statement on Inpatient Glycemic Control (2015)  Target Ranges:  Prepandial:   less than 140 mg/dL      Peak postprandial:   less than 180 mg/dL (1-2 hours)      Critically ill patients:  140 - 180 mg/dL   Lab Results  Component Value Date   GLUCAP 214 (H) 03/12/2023   HGBA1C 7.9 (H) 03/07/2023    Review of Glycemic Control  Latest Reference Range & Units 03/11/23 07:42 03/11/23 11:45 03/11/23 16:14 03/11/23 21:35 03/12/23 08:01  Glucose-Capillary 70 - 99 mg/dL 161 (H) 096 (H) 045 (H) 224 (H) 214 (H)  (H): Data is abnormally high Diabetes history: Type 2 DM Outpatient Diabetes medications: Novolog 70/30 48 units every day, Ozempic 1 mg qwk Current orders for Inpatient glycemic control:  Semglee 30 units every day, Novolog 3 units TID, Novolog 0-15 units TID Inpatient Diabetes Program Recommendations:    Consider: -increasing Semglee 34 units every day -increasing meal coverage- Novolog 5 units TID  Thanks, Lujean Rave, MSN, RNC-OB Diabetes Coordinator [phone] (8a-5p)

## 2023-03-12 NOTE — Plan of Care (Signed)
Pt is aox4, vss, RHC today, heavily diuresing, plan to d/c 11/27

## 2023-03-12 NOTE — Progress Notes (Signed)
Physical Therapy Treatment Patient Details Name: Sonia Morales MRN: [id number] DOB: Jul 15, 1955 Today's Date: 03/12/2023   History of Present Illness Pt is a 67 y.o. female presenting 11/20 with chest pain and shortness of breath. S/p heart cath which showed critical left main coronary stenosis. CT chest with partial bilateral lower lobe consolidations. CT abd/pelvis with multiple small non-obstructing kidney stones. PMH signficiant for CAD, STEMI (2017, NSTEMI 2022 s/p CABG, cardiomyopathy with LVEF 40-45%, poorly controlled IDDM2.    PT Comments  Patient making good progress with mobility and pulmonary response to activity. Ambulation progressed today with no AD. Pt initially using IV pole to steady self but able to ambulate with no support and no significant LOB and pt able to self correct balance. Pt on noted to be saturating at 94% on RA at rest then desat to 87% on RA with ambulation and recovered to 92-94% with 2L during gait. EOS pt agreeable to remain OOB and reviewed IS for respiratory exercise. Pt completed 5 reps achieve ~1000 mL inspiration. Will continue to progress pt as able during stay.   If plan is discharge home, recommend the following:     Can travel by private vehicle        Equipment Recommendations  None recommended by PT    Recommendations for Other Services       Precautions / Restrictions Precautions Precautions: Fall Restrictions Weight Bearing Restrictions: No     Mobility  Bed Mobility Overal bed mobility: Modified Independent             General bed mobility comments: pt OOB in bathroom at start then to recliner    Transfers Overall transfer level: Needs assistance Equipment used: None Transfers: Sit to/from Stand Sit to Stand: Supervision           General transfer comment: sup for safety with rise and lower from toilet, EOB, and recliner.    Ambulation/Gait Ambulation/Gait assistance: Contact guard assist Gait Distance (Feet):  350 Feet Assistive device: IV Pole, None Gait Pattern/deviations: Step-through pattern, Decreased stride length, Drifts right/left Gait velocity: decr     General Gait Details: cues for mangement of IV pole at start of gait. transitioned to no UE support and pt amb overall steady with CGA. pt with mild sway Rt/Lt during 180* turn but able to steady self. pt self grading pace to conserve energy. SpO2 87% on RA with gait and 92-94% on 2L/min with gait.   Stairs             Wheelchair Mobility     Tilt Bed    Modified Rankin (Stroke Patients Only)       Balance Overall balance assessment: Needs assistance Sitting-balance support: Feet supported Sitting balance-Leahy Scale: Good     Standing balance support: No upper extremity supported, During functional activity Standing balance-Leahy Scale: Good                              Cognition Arousal: Alert Behavior During Therapy: WFL for tasks assessed/performed Overall Cognitive Status: Within Functional Limits for tasks assessed                                          Exercises      General Comments        Pertinent Vitals/Pain Pain Assessment Pain Assessment: No/denies pain  Pain Intervention(s): Monitored during session, Repositioned    Home Living                          Prior Function            PT Goals (current goals can now be found in the care plan section) Acute Rehab PT Goals Patient Stated Goal: return home, get stronger PT Goal Formulation: With patient Time For Goal Achievement: 03/25/23 Potential to Achieve Goals: Good Progress towards PT goals: Progressing toward goals    Frequency    Min 1X/week      PT Plan      Co-evaluation              AM-PAC PT "6 Clicks" Mobility   Outcome Measure  Help needed turning from your back to your side while in a flat bed without using bedrails?: None Help needed moving from lying on your back  to sitting on the side of a flat bed without using bedrails?: A Little Help needed moving to and from a bed to a chair (including a wheelchair)?: A Little Help needed standing up from a chair using your arms (e.g., wheelchair or bedside chair)?: A Little Help needed to walk in hospital room?: A Little Help needed climbing 3-5 steps with a railing? : A Little 6 Click Score: 19    End of Session Equipment Utilized During Treatment: Gait belt;Oxygen Activity Tolerance: Patient tolerated treatment well Patient left: in chair;with call bell/phone within reach;with family/visitor present Nurse Communication: Mobility status PT Visit Diagnosis: Difficulty in walking, not elsewhere classified (R26.2)     Time: 1610-9604 PT Time Calculation (min) (ACUTE ONLY): 18 min  Charges:    $Gait Training: 8-22 mins PT General Charges $$ ACUTE PT VISIT: 1 Visit                     Wynn Maudlin, DPT Acute Rehabilitation Services Office [phone]  03/12/23 12:48 PM

## 2023-03-12 NOTE — Interval H&P Note (Signed)
History and Physical Interval Note:  03/12/2023 1:45 PM  Sonia Morales  has presented today for surgery, with the diagnosis of heart failure.  The various methods of treatment have been discussed with the patient and family. After consideration of risks, benefits and other options for treatment, the patient has consented to  Procedure(s): RIGHT HEART CATH (N/A) as a surgical intervention.  The patient's history has been reviewed, patient examined, no change in status, stable for surgery.  I have reviewed the patient's chart and labs.  Questions were answered to the patient's satisfaction.     Hector Venne

## 2023-03-12 NOTE — Progress Notes (Addendum)
Sonia HIOTT  UYQ:[id number] DOB: [birth year] DOA: 03/06/2023 PCP: Richardean Chimera, MD    Brief Narrative:  67 year old with a complex history of CAD to include STEMI 2017 status post LAD PCI and then non-STEMI with cardiogenic shock 2022 status post emergent CABG x 2, ischemic cardiomyopathy with a EF 40-45%, uncontrolled DM2, and HLD who presented to the ER 11/20 with chest pain accompanied by 1 to 2 days of dyspnea on exertion and orthopnea.  She was found to be hypoxic on arrival at 82% on room air.  Goals of Care:   Code Status: Full Code   DVT prophylaxis: heparin injection 5,000 Units Start: 03/07/23 0600   Interim Hx: Afebrile.  Systolics modestly low at 88-94.  No acute events recorded overnight.  Reports that she is feeling much better overall.  No new complaints.  Is motivated to get home.  Heart failure team is planning for a right heart cath today.  Assessment & Plan:  Acute exacerbation of chronic systolic CHF - ischemic cardiomyopathy Management per Advanced Heart Failure Team - TTE this admission noted EF 20-25% with mild MR and TR and normal RV compared to EF 40-45% on prior echo March 2023 -right heart cath 03/06/2023 revealed severely elevated LVEDP - net negative approximately 7L since admission -right heart cath today to further direct medication titration  Filed Weights   03/10/23 0500 03/11/23 0500 03/12/23 0409  Weight: 84.4 kg 83.3 kg 84.6 kg     CAD with acute NSTEMI Cardiac cath 03/06/2023 revealed no focal targets for PCI with critical left main stenosis but LAD and OM filling from patent bypass grafts -note was made of a small to moderate caliber intermediate branch that was not grafted -continue aspirin and Plavix per cardiology -continue statin  Acute hypoxic respiratory failure Felt to primarily be due to pulmonary edema -diuresis ongoing -CT chest noted bilateral lower lobe consolidations consistent with atelectasis versus infiltrate -no clinical  evidence to suggest active pulmonary infection -has now been successfully weaned to room air  Uncontrolled DM2 with hyperglycemia A1c 8.0 - continue to titrate medical therapy and follow trend  History of recurrent UTIs - left flank pain - E. coli UTI POA UA consistent with UTI -renal ultrasound noted no hydronephrosis -CT abdomen confirmed multiple small nonobstructing kidney stones - no SGLT2i as a result  Acute kidney injury Baseline creatinine 0.8 -creatinine peaked at 1.5 - holding steady at approximately 1.3 with ongoing diuresis  Recent Labs  Lab 03/08/23 1402 03/09/23 0426 03/10/23 0458 03/11/23 0434 03/12/23 0405  CREATININE 1.59* 1.39* 1.37* 1.27* 1.31*    Normocytic anemia Anemia panel unremarkable - no evidence of gross blood loss  Obesity - class I - Body mass index is 33.02 kg/m.   Family Communication: No family present at time of exam Disposition: Anticipate discharge home, hopefully 11/27- PT/OT suggest outpatient therapy   Objective: Blood pressure (!) 94/55, pulse 84, temperature 98.6 F (37 C), temperature source Oral, resp. rate 16, height 5\' 3"  (1.6 m), weight 84.6 kg, SpO2 91%.  Intake/Output Summary (Last 24 hours) at 03/12/2023 0939 Last data filed at 03/11/2023 2245 Gross per 24 hour  Intake 490 ml  Output --  Net 490 ml   Filed Weights   03/10/23 0500 03/11/23 0500 03/12/23 0409  Weight: 84.4 kg 83.3 kg 84.6 kg    Examination: General: No acute respiratory distress Lungs: Clear to auscultation bilaterally without wheezes or crackles Cardiovascular: Regular rate and rhythm without murmur gallop or rub normal  S1 and S2 Abdomen: Nontender, nondistended, soft, bowel sounds positive, no rebound, no ascites, no appreciable mass Extremities:   CBC: Recent Labs  Lab 03/10/23 0458 03/11/23 0434 03/12/23 0405  WBC 7.0 7.2 9.0  HGB 11.0* 11.2* 11.1*  HCT 33.5* 34.3* 33.7*  MCV 88.4 90.0 88.2  PLT 247 255 272   Basic Metabolic  Panel: Recent Labs  Lab 03/08/23 0209 03/08/23 1402 03/10/23 0458 03/11/23 0434 03/12/23 0405  NA 136   < > 134* 134* 132*  K 4.0   < > 3.8 4.0 3.9  CL 95*   < > 96* 95* 93*  CO2 29   < > 28 28 26   GLUCOSE 128*   < > 241* 228* 181*  BUN 40*   < > 43* 46* 54*  CREATININE 1.36*   < > 1.37* 1.27* 1.31*  CALCIUM 9.0   < > 9.0 9.3 9.3  MG 1.9  --   --   --   --    < > = values in this interval not displayed.   GFR: Estimated Creatinine Clearance: 44.1 mL/min (A) (by C-G formula based on SCr of 1.31 mg/dL (H)).   Scheduled Meds:  aspirin EC  81 mg Oral Daily   atorvastatin  80 mg Oral QHS   Chlorhexidine Gluconate Cloth  6 each Topical Daily   ciprofloxacin  500 mg Oral BID   clopidogrel  75 mg Oral Daily   FLUoxetine  80 mg Oral BID   furosemide  80 mg Intravenous BID   gabapentin  600 mg Oral QHS   heparin  5,000 Units Subcutaneous Q8H   insulin aspart  0-15 Units Subcutaneous TID WC   insulin aspart  3 Units Subcutaneous TID WC   insulin glargine-yfgn  30 Units Subcutaneous Daily   lidocaine  1 patch Transdermal Q24H   [START ON 03/13/2023] metoprolol succinate  12.5 mg Oral Daily   polyethylene glycol  17 g Oral Daily   sodium chloride flush  10-40 mL Intracatheter Q12H   spironolactone  25 mg Oral Daily   traZODone  50 mg Oral QHS     LOS: 5 days   Lonia Blood, MD [facility] Office  [phone] Pager - Text Page per Loretha Stapler  If 7PM-7AM, please contact night-coverage per Amion 03/12/2023, 9:39 AM

## 2023-03-13 ENCOUNTER — Encounter (HOSPITAL_COMMUNITY): Payer: Self-pay | Admitting: Cardiology

## 2023-03-13 ENCOUNTER — Other Ambulatory Visit (HOSPITAL_COMMUNITY): Payer: Self-pay

## 2023-03-13 DIAGNOSIS — I5023 Acute on chronic systolic (congestive) heart failure: Secondary | ICD-10-CM | POA: Diagnosis not present

## 2023-03-13 DIAGNOSIS — I214 Non-ST elevation (NSTEMI) myocardial infarction: Secondary | ICD-10-CM | POA: Diagnosis not present

## 2023-03-13 DIAGNOSIS — J9601 Acute respiratory failure with hypoxia: Secondary | ICD-10-CM

## 2023-03-13 DIAGNOSIS — I509 Heart failure, unspecified: Secondary | ICD-10-CM | POA: Diagnosis not present

## 2023-03-13 LAB — COOXEMETRY PANEL
Carboxyhemoglobin: 2.2 % — ABNORMAL HIGH (ref 0.5–1.5)
Methemoglobin: <0.7 % (ref 0.0–1.5)
O2 Saturation: 77.6 %
Total hemoglobin: 11.8 g/dL — ABNORMAL LOW (ref 12.0–16.0)

## 2023-03-13 LAB — BASIC METABOLIC PANEL
Anion gap: 10 (ref 5–15)
BUN: 58 mg/dL — ABNORMAL HIGH (ref 8–23)
CO2: 28 mmol/L (ref 22–32)
Calcium: 9.2 mg/dL (ref 8.9–10.3)
Chloride: 97 mmol/L — ABNORMAL LOW (ref 98–111)
Creatinine, Ser: 1.47 mg/dL — ABNORMAL HIGH (ref 0.44–1.00)
GFR, Estimated: 39 mL/min — ABNORMAL LOW (ref >60)
Glucose, Bld: 164 mg/dL — ABNORMAL HIGH (ref 70–99)
Potassium: 4.2 mmol/L (ref 3.5–5.1)
Sodium: 135 mmol/L (ref 135–145)

## 2023-03-13 LAB — GLUCOSE, CAPILLARY
Glucose-Capillary: 195 mg/dL — ABNORMAL HIGH (ref 70–99)
Glucose-Capillary: 275 mg/dL — ABNORMAL HIGH (ref 70–99)

## 2023-03-13 MED ORDER — SPIRONOLACTONE 25 MG PO TABS
12.5000 mg | ORAL_TABLET | Freq: Every day | ORAL | 0 refills | Status: DC
  Filled 2023-03-13: qty 30, 60d supply, fill #0

## 2023-03-13 MED ORDER — FUROSEMIDE 40 MG PO TABS
40.0000 mg | ORAL_TABLET | Freq: Every day | ORAL | 0 refills | Status: DC | PRN
  Filled 2023-03-13: qty 30, 30d supply, fill #0

## 2023-03-13 MED ORDER — LOSARTAN POTASSIUM 25 MG PO TABS
12.5000 mg | ORAL_TABLET | Freq: Every day | ORAL | 0 refills | Status: DC
  Filled 2023-03-13: qty 30, 60d supply, fill #0

## 2023-03-13 MED ORDER — CLOPIDOGREL BISULFATE 75 MG PO TABS
75.0000 mg | ORAL_TABLET | Freq: Every day | ORAL | 0 refills | Status: AC
  Filled 2023-03-13: qty 30, 30d supply, fill #0

## 2023-03-13 MED ORDER — CIPROFLOXACIN HCL 500 MG PO TABS
500.0000 mg | ORAL_TABLET | Freq: Two times a day (BID) | ORAL | 0 refills | Status: AC
  Filled 2023-03-13: qty 6, 3d supply, fill #0

## 2023-03-13 NOTE — Progress Notes (Signed)
Advanced Heart Failure Rounding Note  PCP-Cardiologist: None   Subjective:    Had cath yesterday with low filling pressures and moderately reduced cardiac/index.   Feels ok. Denies SOB . Walked to the desk with Mobility Team.  Objective:   Weight Range: 82.7 kg Body mass index is 32.29 kg/m.   Vital Signs:   Temp:  [97.6 F (36.4 C)-98.5 F (36.9 C)] 97.7 F (36.5 C) (11/27 0755) Pulse Rate:  [81-98] 89 (11/27 0755) Resp:  [15-21] 18 (11/27 0604) BP: (99-143)/(57-123) 99/63 (11/27 0755) SpO2:  [82 %-100 %] 94 % (11/27 0604) Weight:  [82.7 kg] 82.7 kg (11/27 0604) Last BM Date : 03/12/23  Weight change: Filed Weights   03/11/23 0500 03/12/23 0409 03/13/23 0604  Weight: 83.3 kg 84.6 kg 82.7 kg    Intake/Output:   Intake/Output Summary (Last 24 hours) at 03/13/2023 1052 Last data filed at 03/13/2023 0609 Gross per 24 hour  Intake 240 ml  Output 3650 ml  Net -3410 ml   CVP 4  Physical Exam  General:  Sitting in the chair.  No resp difficulty HEENT: normal Neck: supple. no JVD. Carotids 2+ bilat; no bruits. No lymphadenopathy or thryomegaly appreciated. Cor: PMI nondisplaced. Regular rate & rhythm. No rubs, gallops or murmurs. Lungs: clear on 2 liters.  Abdomen: soft, nontender, nondistended. No hepatosplenomegaly. No bruits or masses. Good bowel sounds. Extremities: no cyanosis, clubbing, rash, edema. RUE PICC  Neuro: alert & orientedx3, cranial nerves grossly intact. moves all 4 extremities w/o difficulty. Affect pleasant  Telemetry  SR 80-90s   Labs    CBC Recent Labs    03/11/23 0434 03/12/23 0405 03/12/23 1358  WBC 7.2 9.0  --   HGB 11.2* 11.1* 12.2  12.2  HCT 34.3* 33.7* 36.0  36.0  MCV 90.0 88.2  --   PLT 255 272  --    Basic Metabolic Panel Recent Labs    41/32/44 0405 03/12/23 1358 03/13/23 0509  NA 132* 133*  132* 135  K 3.9 4.1  4.1 4.2  CL 93*  --  97*  CO2 26  --  28  GLUCOSE 181*  --  164*  BUN 54*  --  58*   CREATININE 1.31*  --  1.47*  CALCIUM 9.3  --  9.2   Liver Function Tests No results for input(s): "AST", "ALT", "ALKPHOS", "BILITOT", "PROT", "ALBUMIN" in the last 72 hours.  No results for input(s): "LIPASE", "AMYLASE" in the last 72 hours. Cardiac Enzymes No results for input(s): "CKTOTAL", "CKMB", "CKMBINDEX", "TROPONINI" in the last 72 hours.  BNP: BNP (last 3 results) Recent Labs    03/06/23 0814  BNP 2,236.0*    ProBNP (last 3 results) No results for input(s): "PROBNP" in the last 8760 hours.   D-Dimer No results for input(s): "DDIMER" in the last 72 hours.  Hemoglobin A1C No results for input(s): "HGBA1C" in the last 72 hours.  Fasting Lipid Panel No results for input(s): "CHOL", "HDL", "LDLCALC", "TRIG", "CHOLHDL", "LDLDIRECT" in the last 72 hours.  Thyroid Function Tests No results for input(s): "TSH", "T4TOTAL", "T3FREE", "THYROIDAB" in the last 72 hours.  Invalid input(s): "FREET3"  Other results:  Imaging   CARDIAC CATHETERIZATION  Result Date: 03/12/2023 HEMODYNAMICS: RA:   1 mmHg (mean) RV:   26/2 mmHg PA:   30/10 mmHg (18 mean) PCWP:  5 mmHg (mean)    Estimated Fick CO/CI   4.1L/min, 2.2 L/min/m2    TPG    13  mmHg  PVR     3.1 Wood Units PAPi      20  165cc IVF bolus PA: 30/10 PCWP: 10 IMPRESSION: Low pre and post capillary filling pressures Moderately reduced cardiac output/index. Mildly elevated PVR Sonia Morales 2:10 PM   Medications:    Scheduled Medications:  aspirin EC  81 mg Oral Daily   atorvastatin  80 mg Oral QHS   Chlorhexidine Gluconate Cloth  6 each Topical Daily   ciprofloxacin  500 mg Oral BID   clopidogrel  75 mg Oral Daily   FLUoxetine  80 mg Oral BID   gabapentin  600 mg Oral QHS   heparin  5,000 Units Subcutaneous Q8H   insulin aspart  0-15 Units Subcutaneous TID WC   insulin aspart  3 Units Subcutaneous TID WC   insulin glargine-yfgn  36 Units Subcutaneous Daily   lidocaine  1 patch Transdermal Q24H   losartan   12.5 mg Oral Daily   polyethylene glycol  17 g Oral Daily   sodium chloride flush  10-40 mL Intracatheter Q12H   traZODone  50 mg Oral QHS    Infusions:    PRN Medications: acetaminophen, bisacodyl, morphine injection, nitroGLYCERIN, ondansetron (ZOFRAN) IV, mouth rinse, oxyCODONE, sodium chloride flush  Patient Profile   Sonia Morales is a 67 y.o. female with past medical history of HFrEF, CAD manifested by STEMI in 2017 s/p PCI to LAD, NSTEMI c/b cardiogenic shock s/p emergent CABG (LIMA to LAD, SVG to OM) in 2022, and type 2 diabetes.   Now admitted for NSTEMI.   Assessment/Plan   A/C HFrEF - ischemic cardiomyopathy - 3/23 Echo: 40-45% (improved from 35% pre-CABG in 9/22) GIDD, diffuse LV regional wall motion hypokinesis - Echo 11/24  EF 20-25%. Rv nl. Mild MR and TR. - RHC 11/20 : severely elevated LVEDP 42, PCWP 33, PAP 65/33/47, RA 23, CO/CI 4.39/2.33 - Repeat RHC 11/26 moderately reduced cardiac index and low filling pressures (RA 1 PCWP 5.  - On exam she appears dry. CVP 4. No diuretics. Can drink a little extra fluid today, discussed.  - GDMT limited by hypotension/elevated creatinine.  - Start Cleda Daub to 12.5 at bed time tomorrow -Add losartan 12.5 mg at bed time tomorrow. .  - Hold bb for now and can consider as an outpatient. - No SGLT2i with recurrent UTIs - Would use lasix 40 mg as needed at discharge.  - Discussed daily weights, low salt food choices, and asked her to bring meds to follow up appointment.    2. CAD with NSTEMI - STEMI in 2017 s/p PCI to LAD, NSTEMI c/b cardiogenic shock s/p emergent CABG (LIMA to LAD, SVG to OM) in 2022 - LHC: critical left main coronary stenosis (mLM to dLM 95%, ost LAD to pLAD 100%, ostCx to pCx 100%), patent LIMA to LAD, patent SVG to OM - No chest pain.  - Continue Asprin - Continue Plavix  - Continue Lipitor   3. UTI - history of recurrent UTIs, severe L flank pain - CT 10/11 bilateral non-obstructing renal calculi noted   - Renal US normal without evidence of previous renal calculi - CT 11/22 multiple non-obstructing kidney stones - Ucx > 100K GNR. Ceftriaxone IV 4 days complete. - Cipro 500mg  BID 5 day course  - Per primary.   4. AKI: in the setting of volume overload and UTI - baseline sCr 0.8 - 1.3>1.5. today.  --Hold diuretics. Hold off on ARB and MRA.   5. Acute Hypoxic Respiratory Failure - RA to  2L -  6. DMII - SSI per primary team  Ok to d/c when ok with primary team. F/U with Dr Elwyn Lade 03/22/23. Check BMET at that time.   HF meds for d/c  Losartan 12.5 mg daily at bedtime  start 11/28 Spiro 12.5 mg daily at bedtime start 11/28 Lasix 40  mg daily as needed for 3 pound weight gain in 24 hours.  Aspirin 81 mg daily  Atorvastatin 80 mg daily  Plavix 75 mg daily   Length of Stay: 6  Erma Joubert, NP  03/13/2023, 10:52 AM  Advanced Heart Failure Team Pager [phone] (M-F; 7a - 5p)  Please contact [facility] for night-coverage after hours (5p -7a ) and weekends on [email or web address]

## 2023-03-13 NOTE — Progress Notes (Signed)
Mobility Specialist Progress Note:    03/13/23 1100  Oxygen Therapy  O2 Device Nasal Cannula  O2 Flow Rate (L/min) 2 L/min  Mobility  Activity Ambulated with assistance in hallway  Level of Assistance Standby assist, set-up cues, supervision of patient - no hands on  Assistive Device Other (Comment) (IV Pole)  Distance Ambulated (ft) 350 ft  Activity Response Tolerated well  Mobility Referral Yes  $Mobility charge 1 Mobility  Mobility Specialist Start Time (ACUTE ONLY) 1053  Mobility Specialist Stop Time (ACUTE ONLY) 1116  Mobility Specialist Time Calculation (min) (ACUTE ONLY) 23 min   Pt received in chair, agreeable to mobility. Successfully voided in BR before ambulating in hallway. Asymptomatic w/ no complaints throughout. Pt left in chair with call bell and all needs met.  D'Vante Earlene Plater Mobility Specialist Please contact via Special educational needs teacher or Rehab office at [phone]

## 2023-03-13 NOTE — Progress Notes (Signed)
SATURATION QUALIFICATIONS: (This note is used to comply with regulatory documentation for home oxygen)  Patient Saturations on Room Air at Rest = 88%  Patient Saturations on Room Air while Ambulating = 84%  Patient Saturations on 2 Liters of oxygen while Ambulating = 92%  Please briefly explain why patient needs home oxygen: Patient requires 2L of oxygen in order to maintain saturations above 90% at rest and when completing functional ambulation.   Pollyann Glen E. Tytan Sandate, OTR/L Acute Rehabilitation Services [phone]

## 2023-03-13 NOTE — Plan of Care (Signed)
  Problem: Education: Goal: Knowledge of General Education information will improve Description: Including pain rating scale, medication(s)/side effects and non-pharmacologic comfort measures Outcome: Progressing   Problem: Health Behavior/Discharge Planning: Goal: Ability to manage health-related needs will improve Outcome: Progressing   Problem: Clinical Measurements: Goal: Ability to maintain clinical measurements within normal limits will improve Outcome: Progressing Goal: Will remain free from infection Outcome: Progressing Goal: Diagnostic test results will improve Outcome: Progressing Goal: Respiratory complications will improve Outcome: Progressing Goal: Cardiovascular complication will be avoided Outcome: Progressing   Problem: Activity: Goal: Risk for activity intolerance will decrease Outcome: Progressing   Problem: Nutrition: Goal: Adequate nutrition will be maintained Outcome: Progressing   Problem: Coping: Goal: Level of anxiety will decrease Outcome: Progressing   Problem: Elimination: Goal: Will not experience complications related to bowel motility Outcome: Progressing Goal: Will not experience complications related to urinary retention Outcome: Progressing   Problem: Pain Management: Goal: General experience of comfort will improve Outcome: Progressing   Problem: Safety: Goal: Ability to remain free from injury will improve Outcome: Progressing   Problem: Skin Integrity: Goal: Risk for impaired skin integrity will decrease Outcome: Progressing   Problem: Education: Goal: Ability to describe self-care measures that may prevent or decrease complications (Diabetes Survival Skills Education) will improve Outcome: Progressing Goal: Individualized Educational Video(s) Outcome: Progressing   Problem: Coping: Goal: Ability to adjust to condition or change in health will improve Outcome: Progressing   Problem: Fluid Volume: Goal: Ability to  maintain a balanced intake and output will improve Outcome: Progressing   Problem: Health Behavior/Discharge Planning: Goal: Ability to identify and utilize available resources and services will improve Outcome: Progressing Goal: Ability to manage health-related needs will improve Outcome: Progressing   Problem: Metabolic: Goal: Ability to maintain appropriate glucose levels will improve Outcome: Progressing   Problem: Nutritional: Goal: Maintenance of adequate nutrition will improve Outcome: Progressing Goal: Progress toward achieving an optimal weight will improve Outcome: Progressing   Problem: Skin Integrity: Goal: Risk for impaired skin integrity will decrease Outcome: Progressing   Problem: Tissue Perfusion: Goal: Adequacy of tissue perfusion will improve Outcome: Progressing   Problem: Education: Goal: Understanding of cardiac disease, CV risk reduction, and recovery process will improve Outcome: Progressing Goal: Individualized Educational Video(s) Outcome: Progressing   Problem: Activity: Goal: Ability to tolerate increased activity will improve Outcome: Progressing   Problem: Cardiac: Goal: Ability to achieve and maintain adequate cardiovascular perfusion will improve Outcome: Progressing   Problem: Health Behavior/Discharge Planning: Goal: Ability to safely manage health-related needs after discharge will improve Outcome: Progressing   Problem: Education: Goal: Understanding of CV disease, CV risk reduction, and recovery process will improve Outcome: Progressing Goal: Individualized Educational Video(s) Outcome: Progressing   Problem: Activity: Goal: Ability to return to baseline activity level will improve Outcome: Progressing   Problem: Cardiovascular: Goal: Ability to achieve and maintain adequate cardiovascular perfusion will improve Outcome: Progressing Goal: Vascular access site(s) Level 0-1 will be maintained Outcome: Progressing    Problem: Health Behavior/Discharge Planning: Goal: Ability to safely manage health-related needs after discharge will improve Outcome: Progressing

## 2023-03-13 NOTE — TOC Progression Note (Signed)
Transition of Care [facility]) - Progression Note    Patient Details  Name: Sonia Morales MRN: [id number] Date of Birth: [birth year]  Transition [facility]) CM/SW Contact  Nicanor Bake Phone Number: [phone] 03/13/2023, 1:32 PM  Clinical Narrative:  HF CSW met with pt at bedside. Pt appeared to be in a good mood. She was smiling and happy. Pt inquired about going home again. CSW asked pt about transportation dc plans. Pt stated that her sister or husband will provide transportation home.   TOC will continue following.      Expected Discharge Plan: Home w Home Health Services Barriers to Discharge: Continued Medical Work up  Expected Discharge Plan and Services   Discharge Planning Services: CM Consult Post Acute Care Choice: Home Health Living arrangements for the past 2 months: Single Family Home Expected Discharge Date: 03/13/23                           [facility] Agency: [facility] Date [facility] Agency Contacted: 03/08/23 Time HH Agency Contacted: [phone] Representative spoke with at [facility] Agency: Kandee Keen   Social Determinants of Health (SDOH) Interventions SDOH Screenings   Food Insecurity: No Food Insecurity (03/07/2023)  Housing: Low Risk  (03/07/2023)  Transportation Needs: No Transportation Needs (03/07/2023)  Utilities: Not At Risk (03/07/2023)  Alcohol Screen: Low Risk  (01/04/2021)  Depression (PHQ2-9): Medium Risk (03/23/2021)  Financial Resource Strain: Low Risk  (01/04/2021)  Tobacco Use: Medium Risk (03/07/2023)    Readmission Risk Interventions     No data to display

## 2023-03-13 NOTE — Progress Notes (Signed)
PICC Removal Note: PICC line removed from RUE. PICC catheter tip visualized and intact. Pressure held until hemostasis achieved. Pressure dressing applied. No redness, ecchymosis, edema, swelling, or drainage noted at site. Instructions provided on post PICC discharge care, including followup notification instructions.  Bedrest for 30 minutes after removal.

## 2023-03-13 NOTE — Discharge Summary (Signed)
Physician Discharge Summary   Patient: Sonia Morales MRN: [id number] DOB: 1955-09-06  Admit date:     03/06/2023  Discharge date: 03/13/23  Discharge Physician: Rickey Barbara   PCP: Richardean Chimera, MD   Recommendations at discharge:    Follow up with PCP in 1-2 weeks Follow up with Dr. Elwyn Lade on 12/6, recheck BMET at that time  Discharge Diagnoses: Principal Problem:   Non-ST elevation (NSTEMI) myocardial infarction [facility]) Active Problems:   Type 2 diabetes mellitus with complication, with long-term current use of insulin (HCC)   Acute on chronic combined systolic and diastolic CHF (congestive heart failure) (HCC)   Acute hypoxic respiratory failure (HCC)   Ischemic cardiomyopathy   Acute systolic CHF (congestive heart failure), NYHA class 4 (HCC)  Resolved Problems:   * No resolved hospital problems. [place] [facility] Course: 67 year old with a complex history of CAD to include STEMI 2017 status post LAD PCI and then non-STEMI with cardiogenic shock 2022 status post emergent CABG x 2, ischemic cardiomyopathy with a EF 40-45%, uncontrolled DM2, and HLD who presented to the ER 11/20 with chest pain accompanied by 1 to 2 days of dyspnea on exertion and orthopnea. She was found to be hypoxic on arrival at 82% on room air.   Assessment and Plan: Acute exacerbation of chronic systolic CHF - ischemic cardiomyopathy Management per Advanced Heart Failure Team - TTE this admission noted EF 20-25% with mild MR and TR and normal RV compared to EF 40-45% on prior echo March 2023 -right heart cath 03/06/2023 revealed severely elevated LVEDP -Underwent RHC 11/20 and 11/26 -Cardiology recs to start spironolactone 12.5mg  at bedtime, losartan 12.5mg  at bedtime, lasix 40mg  PRN. BB can be considered later as outpatient -No SGLT2 with recurrent   CAD with acute NSTEMI Cardiac cath 03/06/2023 revealed no focal targets for PCI with critical left main stenosis but LAD and OM filling from patent bypass grafts  -note was made of a small to moderate caliber intermediate branch that was not grafted  -continue aspirin and Plavix per cardiology  -continue statin   Acute hypoxic respiratory failure -Felt to primarily be due to pulmonary edema -diuresis ongoing -CT chest noted bilateral lower lobe consolidations consistent with atelectasis versus infiltrate -no clinical evidence to suggest active pulmonary infection -Will need 2LNC on d/c   Uncontrolled DM2 with hyperglycemia A1c 8.0  - continue to titrate medical therapy and follow trend   History of recurrent UTIs  - left flank pain - E. coli UTI POA UA consistent with UTI -renal ultrasound noted no hydronephrosis  -CT abdomen confirmed multiple small nonobstructing kidney stones  - no SGLT2i as a result   Acute kidney injury Baseline creatinine 0.8 -creatinine peaked at 1.5 -renal function remained stable    Normocytic anemia Anemia panel unremarkable - no evidence of gross blood loss   Obesity  -class I - Body mass index is 33.02 kg/m.    Consultants: Cardiology Procedures performed:   Disposition: Home Diet recommendation:  Cardiac and Carb modified diet DISCHARGE MEDICATION: Allergies as of 03/13/2023       Reactions   Jardiance [empagliflozin] Other (See Comments)   Recurrent UTI   Losartan Other (See Comments)   Hyperkalemia        Medication List     STOP taking these medications    insulin aspart protamine- aspart (70-30) 100 UNIT/ML injection Commonly known as: NOVOLOG MIX 70/30       TAKE these medications    Accu-Chek Guide test  strip Generic drug: glucose blood Use as instructed to monitor glucose 4 times daily   Accu-Chek Softclix Lancets lancets Use as instructed to monitor glucose 4 times daily   aspirin EC 81 MG tablet Take 1 tablet (81 mg total) by mouth daily. Swallow whole.   atorvastatin 80 MG tablet Commonly known as: LIPITOR Take 1 tablet (80 mg total) by mouth daily at 6 PM. What  changed: when to take this   ciprofloxacin 500 MG tablet Commonly known as: CIPRO Take 1 tablet (500 mg total) by mouth 2 (two) times daily for 3 days.   clopidogrel 75 MG tablet Commonly known as: PLAVIX Take 1 tablet (75 mg total) by mouth daily. Start taking on: March 14, 2023   diclofenac Sodium 1 % Gel Commonly known as: VOLTAREN Apply 1 application  topically daily as needed (pain).   FLUoxetine 40 MG capsule Commonly known as: PROZAC Take 2 capsules (80 mg total) by mouth 2 (two) times daily.   furosemide 40 MG tablet Commonly known as: Lasix Take 1 tablet (40 mg total) by mouth daily as needed for edema or fluid (for 3 pound wt gain in 24hrs).   gabapentin 600 MG tablet Commonly known as: NEURONTIN Take 600 mg by mouth See admin instructions. Pt takes 600 mg by mouth every night every single night and then pt takes 600mg  by mouth every other day. Pt's granddaughter states that she cannot take 600mg  by mouth two times daily consistently due to drowsiness.   insulin NPH-regular Human (70-30) 100 UNIT/ML injection Inject 48 Units into the skin daily.   losartan 25 MG tablet Commonly known as: Cozaar Take 0.5 tablets (12.5 mg total) by mouth at bedtime. Start taking on: March 14, 2023   nitroGLYCERIN 0.4 MG SL tablet Commonly known as: NITROSTAT Place 1 tablet (0.4 mg total) under the tongue every 5 (five) minutes as needed for chest pain.   ONE-A-DAY WOMENS PO Take 1 tablet by mouth daily.   Oxycodone HCl 10 MG Tabs Take 10 mg by mouth 5 (five) times daily.   Ozempic (1 MG/DOSE) 4 MG/3ML Sopn Generic drug: Semaglutide (1 MG/DOSE) Inject 1 mg into the skin once a week.   spironolactone 25 MG tablet Commonly known as: Aldactone Take 0.5 tablets (12.5 mg total) by mouth at bedtime. Start taking on: March 14, 2023   traZODone 50 MG tablet Commonly known as: DESYREL Take 75 mg by mouth at bedtime.               Durable Medical Equipment   (From admission, onward)           Start     Ordered   03/13/23 0950  For home use only DME oxygen  Once       Question Answer Comment  Length of Need 6 Months   Mode or (Route) Nasal cannula   Liters per Minute 2   Frequency Continuous (stationary and portable oxygen unit needed)   Oxygen delivery system Gas      03/13/23 0949            Follow-up Information     [facility] Follow up on 03/22/2023.   Specialty: Cardiology Why: [facility] at 2pm with Dr. Elwyn Lade Entrance C. Contact information: [address] [place] [address] [phone]               Discharge Exam: Ceasar Mons Weights   03/11/23 0500 03/12/23  4742 03/13/23 0604  Weight: 83.3 kg 84.6 kg 82.7 kg   General exam: Awake, laying in bed, in nad Respiratory system: Normal respiratory effort, no wheezing Cardiovascular system: regular rate, s1, s2 Gastrointestinal system: Soft, nondistended, positive BS Central nervous system: CN2-12 grossly intact, strength intact Extremities: Perfused, no clubbing Skin: Normal skin turgor, no notable skin lesions seen Psychiatry: Mood normal // no visual hallucinations   Condition at discharge: fair  The results of significant diagnostics from this hospitalization (including imaging, microbiology, ancillary and laboratory) are listed below for reference.   Imaging Studies: CARDIAC CATHETERIZATION  Result Date: 03/12/2023 HEMODYNAMICS: RA:   1 mmHg (mean) RV:   26/2 mmHg PA:   30/10 mmHg (18 mean) PCWP:  5 mmHg (mean)    Estimated Fick CO/CI   4.1L/min, 2.2 L/min/m2    TPG    13  mmHg     PVR     3.1 Wood Units PAPi      20  165cc IVF bolus PA: 30/10 PCWP: 10 IMPRESSION: Low pre and post capillary filling pressures Moderately reduced cardiac output/index. Mildly elevated PVR Aditya Sabharwal 2:10 PM  DG CHEST PORT 1 VIEW  Result Date: 03/12/2023 CLINICAL DATA:  141880 SOB  (shortness of breath) 141880. EXAM: PORTABLE CHEST 1 VIEW COMPARISON:  Chest radiograph 03/06/2023. FINDINGS: Interval insertion of the right upper extremity PICC with tip projecting over the superior cavoatrial junction. Improved pulmonary edema. No consolidation. Low lung volumes accentuate the cardiomediastinal silhouette and pulmonary vasculature. Postoperative changes of median sternotomy and CABG. Partially visualized cervical spinal fusion hardware. No pleural effusion or pneumothorax. IMPRESSION: 1. Interval insertion of the right upper extremity PICC with tip projecting over the superior cavoatrial junction. 2. Improved pulmonary edema. Electronically Signed   By: Orvan Falconer M.D.   On: 03/12/2023 09:15   CT CHEST ABDOMEN PELVIS W CONTRAST  Result Date: 03/08/2023 CLINICAL DATA:  Back and flank pain EXAM: CT CHEST, ABDOMEN, AND PELVIS WITH CONTRAST TECHNIQUE: Multidetector CT imaging of the chest, abdomen and pelvis was performed following the standard protocol during bolus administration of intravenous contrast. RADIATION DOSE REDUCTION: This exam was performed according to the departmental dose-optimization program which includes automated exposure control, adjustment of the mA and/or kV according to patient size and/or use of iterative reconstruction technique. CONTRAST:  75mL OMNIPAQUE IOHEXOL 350 MG/ML SOLN COMPARISON:  CT 01/25/2023, CT chest 09/13/2020 FINDINGS: CT CHEST FINDINGS Cardiovascular: Moderate aortic atherosclerosis. No aneurysm. Coronary vascular calcification. Mild cardiomegaly. No pericardial effusion. Post CABG changes. Ulcerated appearing plaque at the mid distal descending thoracic aorta series 3, image 39. Mediastinum/Nodes: Midline trachea. No thyroid mass. Multiple subcentimeter mediastinal lymph nodes. Esophagus within normal limits. Lungs/Pleura: Emphysema. Partial bilateral lower lobe consolidations. No pleural effusion or pneumothorax. Musculoskeletal: Sternotomy. No  acute osseous abnormality. Fusion of the lower cervical spine. Fusion of T9 and T10 vertebral bodies CT ABDOMEN PELVIS FINDINGS Hepatobiliary: No focal liver abnormality is seen. No gallstones, gallbladder wall thickening, or biliary dilatation. Pancreas: Unremarkable. No pancreatic ductal dilatation or surrounding inflammatory changes. Spleen: Normal in size without focal abnormality. Adrenals/Urinary Tract: Adrenal glands are normal. Kidneys show no hydronephrosis. Multiple small nonobstructing kidney stones. The bladder is unremarkable. Poor excretion of contrast from the kidneys on delayed views. Stomach/Bowel: The stomach is nonenlarged. There is no dilated small bowel. No acute bowel wall thickening. Negative appendix Vascular/Lymphatic: Advanced aortic atherosclerosis. No aneurysm. No suspicious lymph nodes Reproductive: Status post hysterectomy. No adnexal masses. Other: Negative for pelvic effusion or free air. Musculoskeletal:  No acute osseous abnormality. Multilevel degenerative changes. IMPRESSION: 1. Partial bilateral lower lobe consolidations, atelectasis versus pneumonia versus aspiration. IEmphysema. 2. No CT evidence for acute intra-abdominal or pelvic abnormality. 3. Multiple small nonobstructing kidney stones. Poor excretion of contrast from the kidneys on delayed views suggesting decreased renal function, correlate with appropriate laboratory studies. Aortic Atherosclerosis (ICD10-I70.0) and Emphysema (ICD10-J43.9). Electronically Signed   By: Jasmine Pang M.D.   On: 03/08/2023 17:45   [place] EKG SITE RITE  Result Date: 03/08/2023 If Site Rite image not attached, placement could not be confirmed due to current cardiac rhythm.  US RENAL  Result Date: 03/07/2023 CLINICAL DATA:  67 year old female with back pain.  Nephrolithiasis. EXAM: RENAL / URINARY TRACT ULTRASOUND COMPLETE COMPARISON:  Noncontrast CT Abdomen and Pelvis 01/25/2023. FINDINGS: Right Kidney: Renal measurements: 10.4 x 4.6 x  5.9 cm = volume: 148 mL. Echogenicity within normal limits. No mass or hydronephrosis visualized. Right nephrolithiasis and/or renal vascular calcifications last month not evident by ultrasound. Left Kidney: Renal measurements: 10.9 x 5.6 x 5.8 cm = volume: 186 mL. Echogenicity within normal limits. No mass or hydronephrosis visualized. Nephrolithiasis last month not evident by ultrasound. Bladder: Large bladder volume. Appears normal for degree of bladder distention. Other: None. IMPRESSION: Normal sonographic appearance of the kidneys and the urinary bladder. Bilateral nephrolithiasis on CT last month not evident by ultrasound. Electronically Signed   By: Odessa Fleming M.D.   On: 03/07/2023 12:33   CARDIAC CATHETERIZATION  Addendum Date: 03/06/2023     Mid LM to Dist LM lesion is 95% stenosed.   Ost LAD to Prox LAD lesion is 100% stenosed.   Ost Cx to Prox Cx lesion is 100% stenosed.   1st Mrg lesion is 80% stenosed.   Prox RCA to Mid RCA lesion is 30% stenosed.   LIMA graft was visualized by non-selective angiography and is large.   SVG graft was visualized by angiography and is large.   The graft exhibits no disease.   The graft exhibits no disease.   LV end diastolic pressure is severely elevated.   Hemodynamic findings consistent with severe pulmonary hypertension. Critical left main coronary stenosis Patent LIMA to the LAD, no significant subclavian gradient by pullback. Patent SVG to OM. Ramus intermediate was not grafted. RCA is without significant disease Severely elevated LV filling pressures. LVEDP 42 mm Hg. PCWP 36/43 mean 33 mm Hg. Severe elevated pulmonary pressures. PAP 65/33 mean 47 mm Hg. RA pressure elevated with mean 23 mm Hg Cardiac output 4.39 L/min, index 2.33 Plan: patient has advanced heart failure. Lactate is normal. She is normotensive and not acidotic. Needs aggressive diuresis and optimization of CHF therapy. Will ask Advanced heart failure team to see.   Result Date: 03/06/2023 See  surgical note for result.  ECHOCARDIOGRAM COMPLETE  Result Date: 03/06/2023    ECHOCARDIOGRAM REPORT   Patient Name:   Sonia Morales Date of Exam: 03/06/2023 Medical Rec #:  [id number]      Height:       63.0 in Accession #:    [id number]     Weight:       189.0 lb Date of Birth:  04-May-1955       BSA:          1.888 m Patient Age:    67 years       BP:           113/71 mmHg Patient Gender: F  HR:           104 bpm. [facility] Procedure: 2D Echo, Cardiac Doppler, Color Doppler and Intracardiac            Opacification Agent Indications:    NSTEMI l21.4  History:        Patient has prior history of Echocardiogram examinations, most                 recent 01/01/2021. CHF, CAD and Previous Myocardial Infarction;                 Risk Factors:Diabetes, Dyslipidemia and Former Smoker.  Sonographer:    Celesta Gentile RCS Referring Phys: [id number] Ellsworth Lennox IMPRESSIONS  1. Left ventricular ejection fraction, by estimation, is 20 to 25%. The left ventricle has severely decreased function. The left ventricle demonstrates regional wall motion abnormalities (see scoring diagram/findings for description). There is mild asymmetric left ventricular hypertrophy of the lateral segment. Indeterminate diastolic filling due to E-A fusion.  2. Right ventricular systolic function is severely reduced. The right ventricular size is normal. There is normal pulmonary artery systolic pressure.  3. Left atrial size was moderately dilated.  4. The mitral valve is normal in structure. Mild mitral valve regurgitation. No evidence of mitral stenosis.  5. The aortic valve has an indeterminant number of cusps. Aortic valve regurgitation is not visualized. No aortic stenosis is present.  6. The inferior vena cava is dilated in size with <50% respiratory variability, suggesting right atrial pressure of 15 mmHg. Comparison(s): Changes from prior study are noted. LVEF and RV systolic function worsened. FINDINGS  Left  Ventricle: Left ventricular ejection fraction, by estimation, is 20 to 25%. The left ventricle has severely decreased function. The left ventricle demonstrates regional wall motion abnormalities. Definity contrast agent was given IV to delineate the left ventricular endocardial borders. The left ventricular internal cavity size was normal in size. There is mild asymmetric left ventricular hypertrophy of the lateral segment. Indeterminate diastolic filling due to E-A fusion.  LV Wall Scoring: The entire septum and apex are akinetic. The entire anterior wall, entire lateral wall, and entire inferior wall are hypokinetic. Right Ventricle: The right ventricular size is normal. No increase in right ventricular wall thickness. Right ventricular systolic function is severely reduced. There is normal pulmonary artery systolic pressure. The tricuspid regurgitant velocity is 2.05 m/s, and with an assumed right atrial pressure of 15 mmHg, the estimated right ventricular systolic pressure is 31.8 mmHg. Left Atrium: Left atrial size was moderately dilated. Right Atrium: Right atrial size was normal in size. Pericardium: There is no evidence of pericardial effusion. Mitral Valve: The mitral valve is normal in structure. Mild mitral valve regurgitation. No evidence of mitral valve stenosis. Tricuspid Valve: The tricuspid valve is normal in structure. Tricuspid valve regurgitation is mild . No evidence of tricuspid stenosis. Aortic Valve: The aortic valve has an indeterminant number of cusps. Aortic valve regurgitation is not visualized. No aortic stenosis is present. Pulmonic Valve: The pulmonic valve was not well visualized. Pulmonic valve regurgitation is trivial. No evidence of pulmonic stenosis. Aorta: The aortic root is normal in size and structure. Venous: The inferior vena cava is dilated in size with less than 50% respiratory variability, suggesting right atrial pressure of 15 mmHg. IAS/Shunts: No atrial level shunt  detected by color flow Doppler.  LEFT VENTRICLE PLAX 2D LVIDd:         5.30 cm LVIDs:  4.60 cm LV PW:         1.20 cm LV IVS:        0.70 cm LVOT diam:     2.00 cm LV SV:         32 LV SV Index:   17 LVOT Area:     3.14 cm  LV Volumes (MOD) LV vol d, MOD A2C: 150.0 ml LV vol d, MOD A4C: 128.0 ml LV vol s, MOD A2C: 111.0 ml LV vol s, MOD A4C: 84.0 ml LV SV MOD A2C:     39.0 ml LV SV MOD A4C:     128.0 ml LV SV MOD BP:      43.9 ml RIGHT VENTRICLE RV S prime:     6.85 cm/s TAPSE (M-mode): 1.4 cm LEFT ATRIUM             Index        RIGHT ATRIUM           Index LA diam:        4.20 cm 2.22 cm/m   RA Area:     18.10 cm LA Vol (A2C):   76.5 ml 40.53 ml/m  RA Volume:   49.40 ml  26.17 ml/m LA Vol (A4C):   77.4 ml 41.00 ml/m LA Biplane Vol: 80.2 ml 42.49 ml/m  AORTIC VALVE LVOT Vmax:   56.40 cm/s LVOT Vmean:  38.600 cm/s LVOT VTI:    0.103 m  AORTA Ao Root diam: 3.30 cm MITRAL VALVE                  TRICUSPID VALVE MV Area (PHT): 5.38 cm       TR Peak grad:   16.8 mmHg MV Decel Time: 141 msec       TR Vmax:        205.00 cm/s MR Peak grad:    100.0 mmHg MR Mean grad:    61.0 mmHg    SHUNTS MR Vmax:         500.00 cm/s  Systemic VTI:  0.10 m MR Vmean:        366.0 cm/s   Systemic Diam: 2.00 cm MR PISA:         1.01 cm MR PISA Eff ROA: 5 mm MR PISA Radius:  0.40 cm MV E velocity: 130.00 cm/s MV A velocity: 82.00 cm/s MV E/A ratio:  1.59 Vishnu Priya Mallipeddi Electronically signed by Winfield Rast Mallipeddi Signature Date/Time: 03/06/2023/2:56:51 PM    Final    DG Chest Portable 1 View  Result Date: 03/06/2023 CLINICAL DATA:  67 year old female with chest pain, shortness of breath since yesterday. EXAM: PORTABLE CHEST 1 VIEW COMPARISON:  Chest radiographs 02/23/2021. FINDINGS: Portable AP semi upright view at 0818 hours. Chronic CABG. Mild cardiomegaly. Other mediastinal contours are within normal limits. Coarse increased bilateral pulmonary interstitial opacity, slightly asymmetric and greater on the  left. No pneumothorax, consolidation. No pleural effusion identified. Previous anterior and posterior cervical spine fusion partially visible. No acute osseous abnormality identified. Paucity of bowel gas the visible abdomen. IMPRESSION: Chronic CABG, mild cardiomegaly with diffuse increased pulmonary interstitial opacity favored to be acute pulmonary edema. Viral/atypical respiratory infection felt less likely. Electronically Signed   By: Odessa Fleming M.D.   On: 03/06/2023 09:32    Microbiology: Results for orders placed or performed during the hospital encounter of 03/06/23  MRSA Next Gen by PCR, Nasal     Status: None   Collection Time: 03/06/23  6:35 PM  Specimen: Nasal Mucosa; Nasal Swab  Result Value Ref Range Status   MRSA by PCR Next Gen NOT DETECTED NOT DETECTED Final    Comment: (NOTE) The GeneXpert MRSA Assay (FDA approved for NASAL specimens only), is one component of a comprehensive MRSA colonization surveillance program. It is not intended to diagnose MRSA infection nor to guide or monitor treatment for MRSA infections. Test performance is not FDA approved in patients less than 37 years old. Performed at [facility], [address], [place], [place] [address]   Urine Culture (for pregnant, neutropenic or urologic patients or patients with an indwelling urinary catheter)     Status: Abnormal   Collection Time: 03/07/23  2:59 PM   Specimen: Urine, Clean Catch  Result Value Ref Range Status   Specimen Description URINE, CLEAN CATCH  Final   Special Requests   Final    NONE Performed at [facility], [address] [place], [place] [address]    Culture >=100,000 COLONIES/mL ESCHERICHIA COLI (A)  Final   Report Status 03/10/2023 FINAL  Final   Organism ID, Bacteria ESCHERICHIA COLI (A)  Final      Susceptibility   Escherichia coli - MIC*    AMPICILLIN >=32 RESISTANT Resistant     CEFAZOLIN <=4 SENSITIVE Sensitive     CEFEPIME <=0.12 SENSITIVE Sensitive      CEFTRIAXONE <=0.25 SENSITIVE Sensitive     CIPROFLOXACIN <=0.25 SENSITIVE Sensitive     GENTAMICIN >=16 RESISTANT Resistant     IMIPENEM <=0.25 SENSITIVE Sensitive     NITROFURANTOIN <=16 SENSITIVE Sensitive     TRIMETH/SULFA >=320 RESISTANT Resistant     AMPICILLIN/SULBACTAM 16 INTERMEDIATE Intermediate     PIP/TAZO <=4 SENSITIVE Sensitive ug/mL    * >=100,000 COLONIES/mL ESCHERICHIA COLI    Labs: CBC: Recent Labs  Lab 03/08/23 0209 03/09/23 0426 03/10/23 0458 03/11/23 0434 03/12/23 0405 03/12/23 1358  WBC 7.4 9.1 7.0 7.2 9.0  --   HGB 11.0* 12.1 11.0* 11.2* 11.1* 12.2  12.2  HCT 34.1* 36.5 33.5* 34.3* 33.7* 36.0  36.0  MCV 89.5 89.0 88.4 90.0 88.2  --   PLT 215 256 247 255 272  --    Basic Metabolic Panel: Recent Labs  Lab 03/08/23 0209 03/08/23 1402 03/09/23 0426 03/10/23 0458 03/11/23 0434 03/12/23 0405 03/12/23 1358 03/13/23 0509  NA 136   < > 135 134* 134* 132* 133*  132* 135  K 4.0   < > 4.3 3.8 4.0 3.9 4.1  4.1 4.2  CL 95*   < > 89* 96* 95* 93*  --  97*  CO2 29   < > 34* 28 28 26   --  28  GLUCOSE 128*   < > 215* 241* 228* 181*  --  164*  BUN 40*   < > 43* 43* 46* 54*  --  58*  CREATININE 1.36*   < > 1.39* 1.37* 1.27* 1.31*  --  1.47*  CALCIUM 9.0   < > 9.8 9.0 9.3 9.3  --  9.2  MG 1.9  --   --   --   --   --   --   --    < > = values in this interval not displayed.   Liver Function Tests: Recent Labs  Lab 03/07/23 0258 03/08/23 0209 03/09/23 0426 03/10/23 0458  AST 80* 51* 36 22  ALT 59* 56* 50* 35  ALKPHOS 72 68 80 75  BILITOT 1.0 0.7 0.3 0.2  PROT 6.9  7.0 7.7 6.8  ALBUMIN 3.3* 3.3* 3.4* 3.0*   CBG: Recent Labs  Lab 03/12/23 1138 03/12/23 1544 03/12/23 2055 03/13/23 0608 03/13/23 1151  GLUCAP 196* 241* 337* 195* 275*    Discharge time spent: less than 30 minutes.  Signed: Rickey Barbara, MD [facility] 03/13/2023

## 2023-03-13 NOTE — Progress Notes (Signed)
Occupational Therapy Treatment Patient Details Name: Sonia Morales MRN: [id number] DOB: 27-Oct-1955 Today's Date: 03/13/2023   History of present illness Pt is a 67 y.o. female presenting 11/20 with chest pain and shortness of breath. S/p heart cath which showed critical left main coronary stenosis. CT chest with partial bilateral lower lobe consolidations. CT abd/pelvis with multiple small non-obstructing kidney stones. PMH signficiant for CAD, STEMI (2017, NSTEMI 2022 s/p CABG, cardiomyopathy with LVEF 40-45%, poorly controlled IDDM2.   OT comments  Session focus on oxygen saturation assessment, education with regard to energy conservation and return to activity once medically ready for discharge. Patient CGA with use of IV pole for stability in hallway, with O2 decreasing to 84% on RA but returing to 92% with 2L O2 (oxygen saturation note completed). Patients family present at end of session with education provided with regard to assist level and ways to progress with activity tolerance and endurance. OT recommendation remains appropriate, will continue to follow.      If plan is discharge home, recommend the following:  A little help with walking and/or transfers;A little help with bathing/dressing/bathroom;Assistance with cooking/housework;Help with stairs or ramp for entrance   Equipment Recommendations  Tub/shower seat    Recommendations for Other Services      Precautions / Restrictions Precautions Precautions: Fall Restrictions Weight Bearing Restrictions: No       Mobility Bed Mobility Overal bed mobility: Modified Independent                  Transfers Overall transfer level: Needs assistance Equipment used: None Transfers: Sit to/from Stand Sit to Stand: Supervision           General transfer comment: supervision for safety     Balance Overall balance assessment: Needs assistance Sitting-balance support: Feet supported Sitting balance-Leahy Scale:  Good     Standing balance support: No upper extremity supported, During functional activity Standing balance-Leahy Scale: Good                             ADL either performed or assessed with clinical judgement   ADL Overall ADL's : Needs assistance/impaired     Grooming: Set up;Sitting           Upper Body Dressing : Set up;Sitting       Toilet Transfer: Contact guard assist           Functional mobility during ADLs: Contact guard assist General ADL Comments: Session focus on oxygen saturation assessment, education with regard to energy conservation and return to activity once medically ready for discharge. Patient CGA with use of IV pole for stability in hallway, with O2 decreasing to 84% on RA but returing to 92% with 2L O2 (oxygen saturation note completed). Patients family present at end of session with education provided with regard to assist level and ways to progress with activity tolerance and endurance. OT recommendation remains appropriate, will continue to follow.    Extremity/Trunk Assessment              Vision       Perception     Praxis      Cognition Arousal: Alert Behavior During Therapy: WFL for tasks assessed/performed Overall Cognitive Status: Within Functional Limits for tasks assessed  Exercises      Shoulder Instructions       General Comments      Pertinent Vitals/ Pain       Pain Assessment Pain Assessment: No/denies pain Pain Intervention(s): Limited activity within patient's tolerance, Monitored during session, Repositioned  Home Living                                          Prior Functioning/Environment              Frequency  Min 1X/week        Progress Toward Goals  OT Goals(current goals can now be found in the care plan section)  Progress towards OT goals: Progressing toward goals  Acute Rehab OT  Goals Patient Stated Goal: to go home OT Goal Formulation: With patient/family Time For Goal Achievement: 03/25/23 Potential to Achieve Goals: Good  Plan      Co-evaluation                 AM-PAC OT "6 Clicks" Daily Activity     Outcome Measure   Help from another person eating meals?: None Help from another person taking care of personal grooming?: A Little Help from another person toileting, which includes using toliet, bedpan, or urinal?: A Little Help from another person bathing (including washing, rinsing, drying)?: A Little Help from another person to put on and taking off regular upper body clothing?: A Little Help from another person to put on and taking off regular lower body clothing?: A Little 6 Click Score: 19    End of Session Equipment Utilized During Treatment: Gait belt;Oxygen  OT Visit Diagnosis: Unsteadiness on feet (R26.81);Muscle weakness (generalized) (M62.81)   Activity Tolerance Patient tolerated treatment well   Patient Left in bed;with call bell/phone within reach;with family/visitor present   Nurse Communication Mobility status        Time: 7253-6644 OT Time Calculation (min): 18 min  Charges: OT General Charges $OT Visit: 1 Visit OT Treatments $Self Care/Home Management : 8-22 mins  Pollyann Glen E. Richey Doolittle, OTR/L Acute Rehabilitation Services [phone]   Cherlyn Cushing 03/13/2023, 10:54 AM

## 2023-03-13 NOTE — TOC Progression Note (Signed)
Transition of Care [facility]) - Progression Note    Patient Details  Name: Sonia Morales MRN: [id number] Date of Birth: [birth year]  Transition of [facility]) CM/SW Contact  Elliot Cousin, RN Phone Number: [phone] 03/13/2023, 2:01 PM  Clinical Narrative: CM spoke to pt and grand-dtr at bedside.  Offered choice for HH (medicare.gov list placed on chart) Patient agreeable to [facility] for [facility] Contacted [employer] rep, Morrie Sheldon for oxygen for home. Oxygen portable delivered to room. Dtr will provide transportation hone.  Pt has scale at home for daily weights.         Expected Discharge Plan: Home w Home Health Services Barriers to Discharge: No Barriers Identified  Expected Discharge Plan and Services   Discharge Planning Services: CM Consult Post Acute Care Choice: Home Health Living arrangements for the past 2 months: Single Family Home Expected Discharge Date: 03/13/23               DME Arranged: Oxygen DME Agency: Patsy Lager Date DME Agency Contacted: 03/13/23 Time DME Agency Contacted: 1400 Representative spoke with at DME Agency: Morrie Sheldon HH Arranged: RN, PT [facility] Agency: [facility] Date [facility] Agency Contacted: 03/13/23 Time HH Agency Contacted: 1401 Representative spoke with at [facility] Agency: Kandee Keen   Social Determinants of Health (SDOH) Interventions SDOH Screenings   Food Insecurity: No Food Insecurity (03/07/2023)  Housing: Low Risk  (03/07/2023)  Transportation Needs: No Transportation Needs (03/07/2023)  Utilities: Not At Risk (03/07/2023)  Alcohol Screen: Low Risk  (01/04/2021)  Depression (PHQ2-9): Medium Risk (03/23/2021)  Financial Resource Strain: Low Risk  (01/04/2021)  Tobacco Use: Medium Risk (03/07/2023)    Readmission Risk Interventions     No data to display

## 2023-03-19 DIAGNOSIS — M47816 Spondylosis without myelopathy or radiculopathy, lumbar region: Secondary | ICD-10-CM | POA: Diagnosis not present

## 2023-03-19 DIAGNOSIS — M47812 Spondylosis without myelopathy or radiculopathy, cervical region: Secondary | ICD-10-CM | POA: Diagnosis not present

## 2023-03-19 DIAGNOSIS — G4700 Insomnia, unspecified: Secondary | ICD-10-CM | POA: Diagnosis not present

## 2023-03-19 DIAGNOSIS — G894 Chronic pain syndrome: Secondary | ICD-10-CM | POA: Diagnosis not present

## 2023-03-22 ENCOUNTER — Encounter (HOSPITAL_COMMUNITY): Payer: Self-pay | Admitting: Cardiology

## 2023-03-22 ENCOUNTER — Ambulatory Visit (HOSPITAL_COMMUNITY)
Admit: 2023-03-22 | Discharge: 2023-03-22 | Disposition: A | Discharge status: Home / Self Care | Payer: Medicare Other | Source: Ambulatory Visit | Attending: Cardiology | Admitting: Cardiology

## 2023-03-22 VITALS — BP 100/60 | HR 88 | Wt 188.2 lb

## 2023-03-22 DIAGNOSIS — I255 Ischemic cardiomyopathy: Secondary | ICD-10-CM | POA: Diagnosis not present

## 2023-03-22 DIAGNOSIS — I5022 Chronic systolic (congestive) heart failure: Secondary | ICD-10-CM | POA: Diagnosis not present

## 2023-03-22 DIAGNOSIS — I2510 Atherosclerotic heart disease of native coronary artery without angina pectoris: Secondary | ICD-10-CM | POA: Diagnosis not present

## 2023-03-22 DIAGNOSIS — E119 Type 2 diabetes mellitus without complications: Secondary | ICD-10-CM | POA: Diagnosis not present

## 2023-03-22 DIAGNOSIS — Z7982 Long term (current) use of aspirin: Secondary | ICD-10-CM | POA: Diagnosis not present

## 2023-03-22 DIAGNOSIS — Z955 Presence of coronary angioplasty implant and graft: Secondary | ICD-10-CM | POA: Insufficient documentation

## 2023-03-22 DIAGNOSIS — Z79899 Other long term (current) drug therapy: Secondary | ICD-10-CM | POA: Insufficient documentation

## 2023-03-22 DIAGNOSIS — Z7902 Long term (current) use of antithrombotics/antiplatelets: Secondary | ICD-10-CM | POA: Insufficient documentation

## 2023-03-22 DIAGNOSIS — Z951 Presence of aortocoronary bypass graft: Secondary | ICD-10-CM | POA: Insufficient documentation

## 2023-03-22 DIAGNOSIS — I252 Old myocardial infarction: Secondary | ICD-10-CM | POA: Diagnosis not present

## 2023-03-22 LAB — BASIC METABOLIC PANEL
Anion gap: 7 (ref 5–15)
BUN: 34 mg/dL — ABNORMAL HIGH (ref 8–23)
CO2: 28 mmol/L (ref 22–32)
Calcium: 9.2 mg/dL (ref 8.9–10.3)
Chloride: 104 mmol/L (ref 98–111)
Creatinine, Ser: 1.15 mg/dL — ABNORMAL HIGH (ref 0.44–1.00)
GFR, Estimated: 53 mL/min — ABNORMAL LOW (ref >60)
Glucose, Bld: 49 mg/dL — ABNORMAL LOW (ref 70–99)
Potassium: 4.1 mmol/L (ref 3.5–5.1)
Sodium: 139 mmol/L (ref 135–145)

## 2023-03-22 MED ORDER — FUROSEMIDE 40 MG PO TABS: 40.0000 mg | ORAL_TABLET | Freq: Every day | ORAL | 3 refills | Status: DC

## 2023-03-22 NOTE — Progress Notes (Signed)
Height:     Weight: BMI:  Today's Date:  STOP BANG RISK ASSESSMENT S (snore) Have you been told that you snore?     YES   T (tired) Are you often tired, fatigued, or sleepy during the day?   YES  O (obstruction) Do you stop breathing, choke, or gasp during sleep? NO   P (pressure) Do you have or are you being treated for high blood pressure? YES   B (BMI) Is your body index greater than 35 kg/m? NO   A (age) Are you 65 years old or older? YES   N (neck) Do you have a neck circumference greater than 16 inches?   NO   G (gender) Are you a female? NO   TOTAL STOP/BANG "YES" ANSWERS 4                                                                       For Office Use Only              Procedure Order Form    YES to 3+ Stop Bang questions OR two clinical symptoms - patient qualifies for WatchPAT (CPT 95800)      Clinical Notes: Will consult Sleep Specialist and refer for management of therapy due to patient increased risk of Sleep Apnea. Ordering a sleep study due to the following two clinical symptoms: Excessive daytime sleepiness G47.10 / Gastroesophageal reflux K21.9 / Nocturia R35.1 / Morning Headaches G44.221 / Difficulty concentrating R41.840 / Memory problems or poor judgment G31.84 / Personality changes or irritability R45.4 / Loud snoring R06.83 / Depression F32.9 / Unrefreshed by sleep G47.8 / Impotence N52.9 / History of high blood pressure R03.0 / Insomnia G47.00        

## 2023-03-22 NOTE — Progress Notes (Signed)
ADVANCED HEART FAILURE FOLLOW UP CLINIC NOTE  Referring Physician: Richardean Chimera, MD  Primary Care: Richardean Chimera, MD Primary Cardiologist:  HPI: Sonia Morales is a 67 y.o. female with a PMH of HFrEF, CAD manifested by STEMI in 2017 s/p PCI to LAD, NSTEMI c/b cardiogenic shock s/p emergent CABG (LIMA to LAD, SVG to OM) in 2022, and type 2 diabetes who presents for follow up of heart failure..      3/23 Echo: 40-45% (improved from 35% pre-CABG in 9/22) GIDD, diffuse LV regional wall motion hypokinesis   Last seen in Cardiology clinic 12/03/22 for 6 month follow up. At the time she had been doing well without recurrence of angina.   She presented to [facility] with angina radiating to her jaw and right shoulder overnight. She had taken 3 SL NTG every 5 minutes followed by  5 SL every 10 minutes to get through the night. She has been dyspneic for the past few days with orthopnea. In ED was found to be hypoxic requiring 10L nasal cannula to maintain O2.She received ASA 325 loaded and started on heparin gtt. Labs notable for Trops 8553>10249, K 4.3, BUN/sCr 29/0.96. EKG SR 102 bpm with LBBB and ST depression in leads II and V4-6. CXR with pulmonary edema.Taken for echo/heart cath by Dr. [place]. She was then transferred to the ICU and started on nitroglycerin gtt and Lasix 80 IV TID.    Echo with EF 20-25%, entire septum and apex are akinetic,anterior wall, entire lateral wall, and entire inferior wall are hypokinetic, Rvnl. Mild MR and TR .   Cath showed critical left main coronary stenosis (mLM to dLM 95%, ost LAD to pLAD 100%, ostCx to pCx 100%), patent LIMA to LAD, patent SVG to OM, severely elevated LVEDP 42, PCWP 33, PAP 65/33/47, RA 23, CO/CI 4.39/2.33. She was diuresed with significant improvement in her symptoms and discharged.     SUBJECTIVE:  Overall doing well since discharge, still feeling a bit weak and unsteady but no worsening chest pain or shortness of breath.  Her weight has  slightly increased but denies any worsening edema or swelling.  Has been taking her Lasix daily.  PMH, current medications, allergies, social history, and family history reviewed in epic.  PHYSICAL EXAM: Vitals:   03/22/23 1410  BP: 100/60  Pulse: 88  SpO2: 96%   GENERAL: Well nourished and in no apparent distress at rest.  HEENT: The mucous membranes are pink and moist.   PULM:  Normal work of breathing, clear to auscultation bilaterally. Respirations are unlabored.  CARDIAC:  JVP: Not elevated         Normal rate with regular rhythm. No murmurs, rubs or gallops.  No lower extremity edema.  ABDOMEN: Soft, non-tender, non-distended. NEUROLOGIC: Patient is oriented x3 with no focal or lateralizing neurologic deficits.  PSYCH: Patients affect is appropriate, there is no evidence of anxiety or depression.  SKIN: Warm and dry; no lesions or wounds. Warm and well perfused extremities.  DATA REVIEW    ECHO: 11/24 LVEF 20 to 25%, severely reduced RV function, no aortic stenosis  CATH: 03/06/23: Critical left main coronary stenosis Patent LIMA to the LAD, no significant subclavian gradient by pullback. Patent SVG to OM.  Ramus intermediate was not grafted. RCA is without significant disease Severely elevated LV filling pressures. LVEDP 42 mm Hg. PCWP 36/43 mean 33 mm Hg.  Severe elevated pulmonary pressures. PAP 65/33 mean 47 mm Hg. RA pressure elevated with mean 23 mm  Hg Cardiac output 4.39 L/min, index 2.33   Heart failure review: - Classification: Heart failure with reduced EF - Etiology: Ischemic - NYHA Class: IIb - Volume status: Euvolemic - ACEi/ARB/ARNI: Currently up-titrating - Aldosterone antagonist: Currently up-titrating - Beta-blocker: Currently up-titrating - Digoxin: Not indicated - Hydralazine/Nitrates: Not indicated - SGLT2i: Intolerant - GLP-1: Consider in future - Advanced therapies: Not needed at this time - ICD: Currently uptitrating GDMT  ASSESSMENT &  PLAN:  Chronic systolic heart failure: Ischemic cardiomyopathy, previously recovered to 40-45% but now 20-25%. Elevated pressures on admission but improved with aggressive diruesis and GDMT. SBP limits further titration at this time. Good cardiac rehab candidate. - Continue spironolactone 12.5 mg daily, losartan 12.5 mg daily -Consider beta-blocker at next appointment -Lasix 40 mg daily -No SGLT2 with recurrent UTIs -Cardiac rehab -Echocardiogram at next appointment   CAD with NSTEMI: STEMI in 2017 s/p PCI to LAD, NSTEMI c/b cardiogenic shock s/p emergent CABG (LIMA to LAD, SVG to OM) in 2022. LHC: critical left main coronary stenosis (mLM to dLM 95%, ost LAD to pLAD 100%, ostCx to pCx 100%), patent LIMA to LAD, patent SVG to OM. No chest pain..  - Continue Asprin - Continue Plavix  - Continue Lipitor  DMII - Management per PCP    Clearnce Hasten, MD Advanced Heart Failure Mechanical Circulatory Support 03/22/23

## 2023-03-22 NOTE — Patient Instructions (Signed)
Medication Changes:  TAKE LASIX (FUROSEMIDE) 40MG  DAILY   Lab Work:  Labs done today, your results will be available in MyChart, we will contact you for abnormal readings.\  Testing/Procedures:  Your provider has recommended that you have a home sleep study (Itamar Test).  We have provided you with the equipment in our office today. Please go ahead and download the app. DO NOT OPEN OR TAMPER WITH THE BOX UNTIL WE ADVISE YOU TO DO SO. Once insurance has approved the test our office will call you with PIN number and approval to proceed with testing. Once you have completed the test you just dispose of the equipment, the information is automatically uploaded to [place] via blue-tooth technology. If your test is positive for sleep apnea and you need a home CPAP machine you will be contacted by Dr Norris Cross office [facility]) to set this up.  Your physician has requested that you have an echocardiogram. Echocardiography is a painless test that uses sound waves to create images of your heart. It provides your doctor with information about the size and shape of your heart and how well your heart's chambers and valves are working. This procedure takes approximately one hour. There are no restrictions for this procedure. Please do NOT wear cologne, perfume, aftershave, or lotions (deodorant is allowed). Please arrive 15 minutes prior to your appointment time.  Please note: We ask at that you not bring children with you during ultrasound (echo/ vascular) testing. Due to room size and safety concerns, children are not allowed in the ultrasound rooms during exams. Our front office staff cannot provide observation of children in our lobby area while testing is being conducted. An adult accompanying a patient to their appointment will only be allowed in the ultrasound room at the discretion of the ultrasound technician under special circumstances. We apologize for any inconvenience.  Referrals:  YOU HAVE BEEN  REFERRED TO CARDIAC REHAB THEY WILL REACH OUT TO YOU OR CALL TO ARRANGE THIS. PLEASE CALL us WITH ANY CONCERNS   Follow-Up in: 2 MONTHS PLEASE CALL OUR OFFICE AROUND 3 WEEKS FROM NOW TO GET SCHEDULED FOR YOUR APPOINTMENT. PHONE NUMBER IS [phone] OPTION   At the Advanced [facility] Failure [facility], you and your health needs are our priority. We have a designated team specialized in the treatment of Heart Failure. This Care Team includes your primary Heart Failure Specialized Cardiologist (physician), Advanced Practice Providers (APPs- Physician Assistants and Nurse Practitioners), and Pharmacist who all work together to provide you with the care you need, when you need it.   You may see any of the following providers on your designated Care Team at your next follow up:  Dr. Arvilla Meres Dr. Marca Ancona Dr. Dorthula Nettles Dr. Theresia Bough Tonye Becket, NP Robbie Lis, [place] [facility] [place], [place] Brynda Peon, NP [place] Lee, NP Karle Plumber, PharmD   Please be sure to bring in all your medications bottles to every appointment.   Need to Contact us:  If you have any questions or concerns before your next appointment please send [place] a message through [place] or call our office at [phone].    TO LEAVE A MESSAGE FOR THE NURSE SELECT OPTION 2, PLEASE LEAVE A MESSAGE INCLUDING: YOUR NAME DATE OF BIRTH CALL BACK NUMBER REASON FOR CALL**this is important as we prioritize the call backs  YOU WILL RECEIVE A CALL BACK THE SAME DAY AS LONG AS YOU CALL BEFORE 4:00 PM

## 2023-03-22 NOTE — Progress Notes (Signed)
ITAMAR home sleep study given to patient, all instructions explained, waiver signed, and CLOUDPAT registration complete.  

## 2023-03-26 DIAGNOSIS — Z23 Encounter for immunization: Secondary | ICD-10-CM | POA: Diagnosis not present

## 2023-03-26 DIAGNOSIS — I5020 Unspecified systolic (congestive) heart failure: Secondary | ICD-10-CM | POA: Diagnosis not present

## 2023-03-26 DIAGNOSIS — Z6834 Body mass index (BMI) 34.0-34.9, adult: Secondary | ICD-10-CM | POA: Diagnosis not present

## 2023-03-26 DIAGNOSIS — J9611 Chronic respiratory failure with hypoxia: Secondary | ICD-10-CM | POA: Diagnosis not present

## 2023-03-26 DIAGNOSIS — R300 Dysuria: Secondary | ICD-10-CM | POA: Diagnosis not present

## 2023-03-27 ENCOUNTER — Encounter (HOSPITAL_COMMUNITY): Payer: Self-pay

## 2023-04-03 ENCOUNTER — Encounter (HOSPITAL_COMMUNITY): Payer: Medicare Other

## 2023-04-05 ENCOUNTER — Telehealth: Payer: Self-pay

## 2023-04-05 NOTE — Telephone Encounter (Signed)
-----   Message from [facility] [place] [facility] sent at 03/27/2023 10:13 AM EST ----- Regarding: RE: itamar No pre cert reqd ----- Message ----- From: Baird Cancer, RN Sent: 03/22/2023   2:48 PM EST To: Modesta Messing, CMA Subject: itamar                                         Hi- patient has been ordered an itamar by Dr. Elwyn Lade for snoring/stop bang 4   Can you precert?   Thank you

## 2023-04-05 NOTE — Telephone Encounter (Signed)
Attempted to call patient regarding no pre cert required for home sleep study and patient can proceed with Itamar- unable to reach. Left message to call back.

## 2023-04-12 DIAGNOSIS — J9611 Chronic respiratory failure with hypoxia: Secondary | ICD-10-CM | POA: Diagnosis not present

## 2023-04-12 DIAGNOSIS — I5020 Unspecified systolic (congestive) heart failure: Secondary | ICD-10-CM | POA: Diagnosis not present

## 2023-04-12 DIAGNOSIS — R300 Dysuria: Secondary | ICD-10-CM | POA: Diagnosis not present

## 2023-04-15 ENCOUNTER — Encounter (HOSPITAL_COMMUNITY): Payer: Medicare Other | Attending: Cardiology

## 2023-04-15 DIAGNOSIS — G894 Chronic pain syndrome: Secondary | ICD-10-CM | POA: Diagnosis not present

## 2023-04-15 DIAGNOSIS — G4700 Insomnia, unspecified: Secondary | ICD-10-CM | POA: Diagnosis not present

## 2023-04-15 DIAGNOSIS — M47816 Spondylosis without myelopathy or radiculopathy, lumbar region: Secondary | ICD-10-CM | POA: Diagnosis not present

## 2023-04-15 DIAGNOSIS — M47812 Spondylosis without myelopathy or radiculopathy, cervical region: Secondary | ICD-10-CM | POA: Diagnosis not present

## 2023-04-16 ENCOUNTER — Other Ambulatory Visit (HOSPITAL_COMMUNITY): Payer: Self-pay

## 2023-04-16 ENCOUNTER — Telehealth: Payer: Self-pay | Admitting: Cardiology

## 2023-04-16 ENCOUNTER — Encounter: Payer: Self-pay | Admitting: Cardiology

## 2023-04-16 ENCOUNTER — Ambulatory Visit (HOSPITAL_COMMUNITY)
Admission: RE | Admit: 2023-04-16 | Discharge: 2023-04-16 | Disposition: A | Discharge status: Home / Self Care | Payer: Medicare Other | Source: Ambulatory Visit | Attending: Cardiology | Admitting: Cardiology

## 2023-04-16 DIAGNOSIS — I340 Nonrheumatic mitral (valve) insufficiency: Secondary | ICD-10-CM | POA: Insufficient documentation

## 2023-04-16 DIAGNOSIS — I5022 Chronic systolic (congestive) heart failure: Secondary | ICD-10-CM | POA: Diagnosis not present

## 2023-04-16 DIAGNOSIS — Z006 Encounter for examination for normal comparison and control in clinical research program: Secondary | ICD-10-CM

## 2023-04-16 LAB — ECHOCARDIOGRAM COMPLETE
Area-P 1/2: 5.27 cm2
S' Lateral: 4.5 cm
Single Plane A4C EF: 36.7 %

## 2023-04-16 MED ORDER — SPIRONOLACTONE 25 MG PO TABS: 12.5000 mg | ORAL_TABLET | Freq: Every day | ORAL | 2 refills | Status: DC

## 2023-04-16 MED ORDER — LOSARTAN POTASSIUM 25 MG PO TABS: 12.5000 mg | ORAL_TABLET | Freq: Every day | ORAL | 2 refills | Status: DC

## 2023-04-16 MED ORDER — CLOPIDOGREL BISULFATE 75 MG PO TABS: 75.0000 mg | ORAL_TABLET | Freq: Every day | ORAL | 2 refills | Status: DC

## 2023-04-16 NOTE — Research (Signed)
 SITE: [id number]     Subject # [id number]    Subprotocol: A  Inclusion Criteria  Patients who meet all of the following criteria are eligible for enrollment as study participants:  Yes No  Age > 65 years old X   Eligible to wear Holter Study X    Exclusion Criteria  Patients who meet any of these criteria are not eligible for enrollment as study participants: Yes No  1. Receiving any mechanical (respiratory or circulatory) or renal support therapy at Screening or during Visit #1.  X  2.  Any other conditions that in the opinion of the investigators are likely to prevent compliance with the study protocol or pose a safety concern if the subject participates in the study.  X  3. Poor tolerance, namely susceptible to severe skin allergies from ECG adhesive patch application.  X   Protocol: REV H                                     Residential Zip [address] (First 3 digits ONLY)                                             [employer] Informed Consent   Subject Name: Sonia Morales  Subject met inclusion and exclusion criteria.  The informed consent form, study requirements and expectations were reviewed with the subject. Subject had opportunity to read consent and questions and concerns were addressed prior to the signing of the consent form.  The subject verbalized understanding of the trial requirements.  The subject agreed to participate in the [employer] EF ACT trial and signed the informed consent at 14:37 on 16-Apr-2023.  The informed consent was obtained prior to performance of any protocol-specific procedures for the subject.  A copy of the signed informed consent was given to the subject and a copy was placed in the subject's medical record.   Sonia Morales          Current Outpatient Medications:    Accu-Chek Softclix Lancets lancets, Use as instructed to monitor glucose 4 times daily, Disp: 100 each, Rfl: 12   aspirin  EC 81 MG EC tablet, Take 1 tablet (81 mg total) by mouth daily. Swallow  whole., Disp: 30 tablet, Rfl: 11   atorvastatin  (LIPITOR ) 80 MG tablet, Take 1 tablet (80 mg total) by mouth daily at 6 PM., Disp: 30 tablet, Rfl: 0   clopidogrel  (PLAVIX ) 75 MG tablet, Take 1 tablet (75 mg total) by mouth daily., Disp: 90 tablet, Rfl: 2   diclofenac Sodium (VOLTAREN) 1 % GEL, Apply 1 application  topically daily as needed (pain)., Disp: , Rfl:    FLUoxetine  (PROZAC ) 40 MG capsule, Take 2 capsules (80 mg total) by mouth 2 (two) times daily., Disp: , Rfl:    furosemide  (LASIX ) 40 MG tablet, Take 1 tablet (40 mg total) by mouth daily., Disp: 30 tablet, Rfl: 3   gabapentin  (NEURONTIN ) 600 MG tablet, Take 600 mg by mouth See admin instructions. Pt takes 600 mg by mouth every night every single night and then pt takes 600mg  by mouth every other day. Pt's granddaughter states that she cannot take 600mg  by mouth two times daily consistently due to drowsiness., Disp: , Rfl:    glucose blood (ACCU-CHEK GUIDE) test strip, Use as  instructed to monitor glucose 4 times daily, Disp: 100 each, Rfl: 12   insulin  NPH-regular Human (70-30) 100 UNIT/ML injection, Inject 48 Units into the skin daily., Disp: , Rfl:    losartan  (COZAAR ) 25 MG tablet, Take 0.5 tablets (12.5 mg total) by mouth at bedtime., Disp: 45 tablet, Rfl: 2   Multiple Vitamins-Minerals (ONE-A-DAY WOMENS PO), Take 1 tablet by mouth daily., Disp: , Rfl:    nitroGLYCERIN  (NITROSTAT ) 0.4 MG SL tablet, Place 1 tablet (0.4 mg total) under the tongue every 5 (five) minutes as needed for chest pain., Disp: 25 tablet, Rfl: 2   Oxycodone  HCl 10 MG TABS, Take 10 mg by mouth 5 (five) times daily., Disp: , Rfl:    Semaglutide, 1 MG/DOSE, (OZEMPIC, 1 MG/DOSE,) 4 MG/3ML SOPN, Inject 1 mg into the skin once a week., Disp: , Rfl:    spironolactone  (ALDACTONE ) 25 MG tablet, Take 0.5 tablets (12.5 mg total) by mouth at bedtime., Disp: 45 tablet, Rfl: 2   traZODone  (DESYREL ) 50 MG tablet, Take 75 mg by mouth at bedtime., Disp: , Rfl:

## 2023-04-16 NOTE — Telephone Encounter (Signed)
 Pt seen in HF clinic on 03/22/23 by Zenaida:  CAD with NSTEMI: STEMI in 2017 s/p PCI to LAD, NSTEMI c/b cardiogenic shock s/p emergent CABG (LIMA to LAD, SVG to OM) in 2022. LHC: critical left main coronary stenosis (mLM to dLM 95%, ost LAD to pLAD 100%, ostCx to pCx 100%), patent LIMA to LAD, patent SVG to OM. No chest pain..  - Continue Asprin - Continue Plavix   - Continue Lipitor   Refill plavix  sent to pharmacy on file at this time by Ganji

## 2023-04-16 NOTE — Telephone Encounter (Signed)
 Pt c/o medication issue:  1. Name of Medication: Plavix  75 mg   2. How are you currently taking this medication (dosage and times per day)?   3. Are you having a reaction (difficulty breathing--STAT)?   4. What is your medication issue?  Patient was prescribed this medication in hospital for 30 days. Requesting another refill this med.

## 2023-05-01 ENCOUNTER — Ambulatory Visit: Payer: Medicare Other | Attending: Physician Assistant | Admitting: Emergency Medicine

## 2023-05-01 ENCOUNTER — Encounter: Payer: Self-pay | Admitting: Physician Assistant

## 2023-05-01 VITALS — BP 110/60 | HR 51 | Ht 63.0 in | Wt 191.0 lb

## 2023-05-01 DIAGNOSIS — I5022 Chronic systolic (congestive) heart failure: Secondary | ICD-10-CM | POA: Diagnosis not present

## 2023-05-01 DIAGNOSIS — G4730 Sleep apnea, unspecified: Secondary | ICD-10-CM | POA: Diagnosis not present

## 2023-05-01 DIAGNOSIS — E785 Hyperlipidemia, unspecified: Secondary | ICD-10-CM | POA: Diagnosis not present

## 2023-05-01 DIAGNOSIS — I2510 Atherosclerotic heart disease of native coronary artery without angina pectoris: Secondary | ICD-10-CM | POA: Insufficient documentation

## 2023-05-01 DIAGNOSIS — I779 Disorder of arteries and arterioles, unspecified: Secondary | ICD-10-CM | POA: Diagnosis not present

## 2023-05-01 DIAGNOSIS — I255 Ischemic cardiomyopathy: Secondary | ICD-10-CM | POA: Insufficient documentation

## 2023-05-01 DIAGNOSIS — N1831 Chronic kidney disease, stage 3a: Secondary | ICD-10-CM | POA: Insufficient documentation

## 2023-05-01 NOTE — Patient Instructions (Addendum)
 Medication Instructions:  Your physician recommends that you continue on your current medications as directed. Please refer to the Current Medication list given to you today.  *If you need a refill on your cardiac medications before your next appointment, please call your pharmacy*   Lab Work: BMET, CBC, TSH   If you have labs (blood work) drawn today and your tests are completely normal, you will receive your results only by: MyChart Message (if you have MyChart) OR A paper copy in the mail If you have any lab test that is abnormal or we need to change your treatment, we will call you to review the results.   Testing/Procedures: Your physician has requested that you have a carotid duplex. This test is an ultrasound of the carotid arteries in your neck. It looks at blood flow through these arteries that supply the brain with blood. Allow one hour for this exam. There are no restrictions or special instructions.   Follow-Up: At [facility], you and your health needs are our priority.  As part of our continuing mission to provide you with exceptional heart care, we have created designated Provider Care Teams.  These Care Teams include your primary Cardiologist (physician) and Advanced Practice Providers (APPs -  Physician Assistants and Nurse Practitioners) who all work together to provide you with the care you need, when you need it.  We recommend signing up for the patient portal called "MyChart".  Sign up information is provided on this After Visit Summary.  MyChart is used to connect with patients for Virtual Visits (Telemedicine).  Patients are able to view lab/test results, encounter notes, upcoming appointments, etc.  Non-urgent messages can be sent to your provider as well.   To learn more about what you can do with MyChart, go to [email or web address].    Your next appointment:   6 month(s)  Provider:   Dr. Knox Perl or APP     Other Instructions

## 2023-05-01 NOTE — Progress Notes (Signed)
 Cardiology Office Note:    Date:  05/01/2023  ID:  Sonia Morales, DOB [birth year], MRN [id number] PCP: Sonia Pulling, MD  [facility] Providers Cardiologist:  Knox Perl, MD       Patient Profile:      Sonia Morales is a 68 year old female with visit pertinent history of chronic HFrEF/ICM, CAD s/p STEMI in 2017 with PCI to LAD, NSTEMI s/p cardiogenic shock with emergent CABG (LIMA to LAD, SVG to OM) in 2022, T2DM, HTN, HLD, neurogenic orthostatic hypotension tobacco use, mild carotid artery disease.  EF was 20-25% at time of MI in 2017, improved to 50% in 2020. She presented to [facility] on 12/2020 with acute hypoxic respiratory failure and pulmonary edema, she was subsequently transferred to [facility] where she underwent cardiac catheterization revealing severe left main and LCx stenosis.  She underwent CABG x 2 (LIMA to LAD, SVG to OM) by Dr. Deloise Ferries.  Echo 12/2020 showed LVEF 30-35%.   She had an echocardiogram March 2023 which showed EF of 40-45% (which had improved from 35% pre-CABG in 12/2020), grade 1 DD, diffuse LV regional wall motion hypokinesis.  She presented to the hospital on 03/06/2023 with angina radiating to her jaw and right shoulder.  She had been dyspneic for a few days with associated orthopnea.  In the ED she was found to be hypoxic requiring 10 L nasal cannula to maintain O2.  Labs notable for troponin 8553, 10249.  EKG showed sinus rhythm with heart rate 102 bpm with LBBB and ST depression in leads II and V4 through 6.  Her chest x-ray showed pulmonary edema.  She was taken for echocardiogram and heart catheterization.  Echocardiogram showed LVEF 20-25%, entire septum and apex are akinetic, anterior wall, lateral wall, inferior wall are hypokinetic, mild MR and TR. Cardiac catheterization on 03/06/2023 showed critical left main coronary stenosis (mLM to dLM 95%, ost LAD to pLAD 100%, ostCx to pCx 100%), patent LIMA to LAD, patent SVG to OM, severely elevated LV  filling pressers and pulmonary pressers.  She was diuresed with significant improvement in her symptoms. Repeat right heart cath 03/12/2023 showed low pre and post capillary filling pressures, moderately reduced cardiac output/index, mildly elevated PVR. She was started on Plavix  75mg ,  spironolactone  12.5 mg and losartan  12.5 mg as well as Lasix  40 mg as needed.  No SGLT2i was ordered with history of recurrent UTIs.  She was discharged home in stable condition  She followed up with advanced heart failure clinic on 03/22/2023 and noted to be doing well since discharge with no recurrence of chest pain or shortness of breath.  She was started on Lasix  40 mg daily and continued on spironolactone  12.5 mg and losartan  12.5 mg. Follow-up echocardiogram on 04/16/2023 shows LVEF 35-40%, LV internal cavity moderately dilated, grade 1 DD, RV SF moderately reduced, RV size mildly enlarged, left atrial size mildly dilated, mild mitral valve regurgitation.      History of Present Illness:  Discussed the use of AI scribe software for clinical note transcription with the patient, who gave verbal consent to proceed.  Sonia Morales is a 68 y.o. female who returns for hospital follow-up for CAD.   [name] arrives to clinic today with her sister.  She notes she has been doing well since being discharged from the hospital.  She notes that she tends to stay pretty active where she will clean her house and clean her family members houses as well as bake.  She notes that she has been infrequently weighing herself at home so she is unsure if she has gained any weight.  She has her home device for sleep study but has not completed this yet.   She experiences shortness of breath, particularly with exertion.  She denies any shortness of breath at rest, orthopnea, PND.  She uses oxygen  as needed, approximately once every couple of days at a rate of 2 liters after she has exerted herself.  She is without any exertional angina, leg  swelling.    Review of Systems  Constitutional: Negative for weight gain and weight loss.  Cardiovascular:  Positive for dyspnea on exertion. Negative for chest pain, claudication, irregular heartbeat, leg swelling, near-syncope, orthopnea, palpitations, paroxysmal nocturnal dyspnea and syncope.  Respiratory:  Negative for cough, hemoptysis and shortness of breath.   Gastrointestinal:  Negative for abdominal pain, hematochezia and melena.  Genitourinary:  Negative for hematuria.  Neurological:  Negative for dizziness and light-headedness.     See HPI     Home Medications:    Prior to Admission medications   Medication Sig Start Date End Date Taking? Authorizing Provider  Accu-Chek Softclix Lancets lancets Use as instructed to monitor glucose 4 times daily 09/15/20   Wendel Hals, NP  aspirin  EC 81 MG EC tablet Take 1 tablet (81 mg total) by mouth daily. Swallow whole. 01/12/21   Barrett, Erin R, PA-C  atorvastatin  (LIPITOR ) 80 MG tablet Take 1 tablet (80 mg total) by mouth daily at 6 PM. 10/06/15   Knox Perl, MD  clopidogrel  (PLAVIX ) 75 MG tablet Take 1 tablet (75 mg total) by mouth daily. 04/16/23   Knox Perl, MD  diclofenac Sodium (VOLTAREN) 1 % GEL Apply 1 application  topically daily as needed (pain).    [provider]  FLUoxetine  (PROZAC ) 40 MG capsule Take 2 capsules (80 mg total) by mouth 2 (two) times daily. 05/10/21   Cantwell, Celeste C, PA-C  furosemide  (LASIX ) 40 MG tablet Take 1 tablet (40 mg total) by mouth daily. 03/22/23   Lauralee Poll, MD  gabapentin  (NEURONTIN ) 600 MG tablet Take 600 mg by mouth See admin instructions. Pt takes 600 mg by mouth every night every single night and then pt takes 600mg  by mouth every other day. Pt's granddaughter states that she cannot take 600mg  by mouth two times daily consistently due to drowsiness. 11/14/22   [provider]  glucose blood (ACCU-CHEK GUIDE) test strip Use as instructed to monitor glucose 4 times  daily 09/15/20   Wendel Hals, NP  insulin  NPH-regular Human (70-30) 100 UNIT/ML injection Inject 48 Units into the skin daily.    [provider]  losartan  (COZAAR ) 25 MG tablet Take 0.5 tablets (12.5 mg total) by mouth at bedtime. 04/16/23   Knox Perl, MD  Multiple Vitamins-Minerals (ONE-A-DAY WOMENS PO) Take 1 tablet by mouth daily.    [provider]  nitroGLYCERIN  (NITROSTAT ) 0.4 MG SL tablet Place 1 tablet (0.4 mg total) under the tongue every 5 (five) minutes as needed for chest pain. 12/03/22   Knox Perl, MD  Oxycodone  HCl 10 MG TABS Take 10 mg by mouth 5 (five) times daily.    [provider]  Semaglutide, 1 MG/DOSE, (OZEMPIC, 1 MG/DOSE,) 4 MG/3ML SOPN Inject 1 mg into the skin once a week.    [provider]  spironolactone  (ALDACTONE ) 25 MG tablet Take 0.5 tablets (12.5 mg total) by mouth at bedtime. 04/16/23   Knox Perl, MD  traZODone  (  DESYREL ) 50 MG tablet Take 75 mg by mouth at bedtime. 07/19/18   [provider]   Studies Reviewed:       Echocardiogram 04/16/2023 1. Left ventricular ejection fraction, by estimation, is 35 to 40%. The  left ventricle has moderately decreased function. The left ventricle  demonstrates regional wall motion abnormalities (see scoring  diagram/findings for description). The left  ventricular internal cavity size was moderately dilated. Left ventricular  diastolic parameters are consistent with Grade I diastolic dysfunction  (impaired relaxation).   2. Right ventricular systolic function is moderately reduced. The right  ventricular size is mildly enlarged. Tricuspid regurgitation signal is  inadequate for assessing PA pressure.   3. Left atrial size was mildly dilated.   4. The mitral valve is abnormal. Mild mitral valve regurgitation.   5. The aortic valve is tricuspid. Aortic valve regurgitation is not  visualized.   6. The inferior vena cava is normal in size with greater than 50%  respiratory  variability, suggesting right atrial pressure of 3 mmHg.   Comparison(s): Changes from prior study are noted. 03/06/2023: LVEF  20-25%, septal and apical akinesis, anterior wall hypokinesis.   Right heart catheterization 03/12/2023 IMPRESSION: Low pre and post capillary filling pressures Moderately reduced cardiac output/index.  Mildly elevated PVR   R/L heart catheterization 03/06/2023 Critical left main coronary stenosis Patent LIMA to the LAD, no significant subclavian gradient by pullback. Patent SVG to OM.  Ramus intermediate was not grafted. RCA is without significant disease Severely elevated LV filling pressures. LVEDP 42 mm Hg. PCWP 36/43 mean 33 mm Hg.  Severe elevated pulmonary pressures. PAP 65/33 mean 47 mm Hg. RA pressure elevated with mean 23 mm Hg Cardiac output 4.39 L/min, index 2.33 Diagnostic Dominance: Right   Risk Assessment/Calculations:             Physical Exam:   VS:  BP 110/60   Pulse (!) 51   Ht 5\' 3"  (1.6 m)   Wt 191 lb (86.6 kg)   SpO2 93%   BMI 33.83 kg/m    Wt Readings from Last 3 Encounters:  05/01/23 191 lb (86.6 kg)  03/22/23 188 lb 3.2 oz (85.4 kg)  03/13/23 182 lb 4.8 oz (82.7 kg)    Constitutional:      Appearance: Normal and healthy appearance. Not in distress.  HENT:     Head: Normocephalic.  Neck:     Vascular: JVD normal.  Pulmonary:     Effort: Pulmonary effort is normal.     Breath sounds: Normal breath sounds.  Chest:     Chest wall: Not tender to palpatation.  Cardiovascular:     PMI at left midclavicular line. Normal rate. Regular rhythm. Normal S1. Normal S2.      Murmurs: There is no murmur.     No gallop.  No click. No rub.  Pulses:    Intact distal pulses.  Edema:    Peripheral edema absent.  Musculoskeletal: Normal range of motion.     Cervical back: Normal range of motion and neck supple. Skin:    General: Skin is warm and dry.  Neurological:     General: No focal deficit present.     Mental Status:  Alert, oriented to person, place, and time and oriented to person, place and time.  Psychiatric:        Mood and Affect: Mood and affect normal.        Behavior: Behavior is cooperative.  Thought Content: Thought content normal.       Assessment and Plan:  Chronic HFrEF / Ischemic cardiomyopathy -On 06/2021 Echo showed 40-45% (improved from 35% pre-CABG in 12/2020). 20-25% on 02/2023 during recent hospitalization. Now EF 35-40% on Echo 04/16/2023.  -L/R heart catheterization 03/06/23 with severally elevated LV filling pressors and pulmonary pressures and repeat RHC 03/12/23 after IV diuresis w/ moderately reduced cardiac index and low filling pressures  -EF has improved now at 35-40%. She is euvolemic and well-compensated on exam. NYHA class II with associated DOE.  -Currently managed by AHF clinic. They mentioned potentially adding beta blocker at next OV. We will defer today given HR 51. - Plan for BMET, CBC, and TSH today.  -Continue Spironolactone  12.5mg  daily, Losartan  12.5mg  daily, and Lasix  40mg  daily  -She has plans to reach out to cardiac rehab to begin participating  -Encouraged daily weights, <2g Na restcrion, and <2L fluid restriction   CAD / HLD She was started on Plavix  during recent hospital admission for medical management of NSTEMI -She is stable with no anginal symptoms, no indication for ischemic evaluation at this time  -Her LDL was 38 on 02/2023 and under excellent control -Continue DAPT ASA 81mg , Plavix  75mg  daily, Atorvastatin  80mg  daily. Anticipate 1 year of DAPT, decision regarding ultimate Plavix  duration will be at discretion of primary cardiologist (please review in follow-up)  CKD stage 3a Creatinine 1.15 and GFR 53 on 03/2023 -Repeat BMET today   Carotid artery disease  Carotid US  03/2021 showed 1-15% stenosis in bilateral ICAs  -Repeat carotid US  today for routine monitoring given discrepancy in 2022 values compared to prior  Sleep disordered  breathing  STOP-BANG: 4 -Has home [place] sleep study in hand and plans to complete soon             Dispo:  Return in about 6 months (around 10/29/2023).  Signed, Ava Boatman, NP        05/01/2023, 12:23 PM [facility]  I spent examining this patient, reviewing medications, and using patient centered shared decision making involving their cardiac care.   I spent greater than 20 minutes reviewing their past medical history,  medications, and prior cardiac tests.

## 2023-05-02 LAB — BASIC METABOLIC PANEL
BUN/Creatinine Ratio: 30 — ABNORMAL HIGH (ref 12–28)
BUN: 28 mg/dL — ABNORMAL HIGH (ref 8–27)
CO2: 27 mmol/L (ref 20–29)
Calcium: 9.4 mg/dL (ref 8.7–10.3)
Chloride: 102 mmol/L (ref 96–106)
Creatinine, Ser: 0.93 mg/dL (ref 0.57–1.00)
Glucose: 125 mg/dL — ABNORMAL HIGH (ref 70–99)
Potassium: 4.7 mmol/L (ref 3.5–5.2)
Sodium: 140 mmol/L (ref 134–144)
eGFR: 68 mL/min/{1.73_m2} (ref >59)

## 2023-05-02 LAB — CBC
Hematocrit: 34 % (ref 34.0–46.6)
Hemoglobin: 10.8 g/dL — ABNORMAL LOW (ref 11.1–15.9)
MCH: 29.3 pg (ref 26.6–33.0)
MCHC: 31.8 g/dL (ref 31.5–35.7)
MCV: 92 fL (ref 79–97)
Platelets: 208 10*3/uL (ref 150–450)
RBC: 3.69 x10E6/uL — ABNORMAL LOW (ref 3.77–5.28)
RDW: 12.8 % (ref 11.7–15.4)
WBC: 6.2 10*3/uL (ref 3.4–10.8)

## 2023-05-02 LAB — TSH: TSH: 1.91 u[IU]/mL (ref 0.450–4.500)

## 2023-05-06 ENCOUNTER — Ambulatory Visit (HOSPITAL_COMMUNITY)
Admission: RE | Admit: 2023-05-06 | Discharge: 2023-05-06 | Disposition: A | Discharge status: Home / Self Care | Payer: Medicare Other | Source: Ambulatory Visit | Attending: Internal Medicine

## 2023-05-06 ENCOUNTER — Encounter (HOSPITAL_COMMUNITY): Payer: Self-pay

## 2023-05-06 VITALS — BP 90/54 | HR 83 | Wt 191.2 lb

## 2023-05-06 DIAGNOSIS — Z955 Presence of coronary angioplasty implant and graft: Secondary | ICD-10-CM | POA: Insufficient documentation

## 2023-05-06 DIAGNOSIS — I2510 Atherosclerotic heart disease of native coronary artery without angina pectoris: Secondary | ICD-10-CM

## 2023-05-06 DIAGNOSIS — Z951 Presence of aortocoronary bypass graft: Secondary | ICD-10-CM | POA: Insufficient documentation

## 2023-05-06 DIAGNOSIS — N1831 Chronic kidney disease, stage 3a: Secondary | ICD-10-CM

## 2023-05-06 DIAGNOSIS — I252 Old myocardial infarction: Secondary | ICD-10-CM | POA: Insufficient documentation

## 2023-05-06 DIAGNOSIS — Z7902 Long term (current) use of antithrombotics/antiplatelets: Secondary | ICD-10-CM | POA: Insufficient documentation

## 2023-05-06 DIAGNOSIS — I5022 Chronic systolic (congestive) heart failure: Secondary | ICD-10-CM | POA: Diagnosis not present

## 2023-05-06 DIAGNOSIS — Z794 Long term (current) use of insulin: Secondary | ICD-10-CM | POA: Diagnosis not present

## 2023-05-06 DIAGNOSIS — I255 Ischemic cardiomyopathy: Secondary | ICD-10-CM | POA: Diagnosis not present

## 2023-05-06 DIAGNOSIS — E119 Type 2 diabetes mellitus without complications: Secondary | ICD-10-CM | POA: Diagnosis not present

## 2023-05-06 DIAGNOSIS — E1122 Type 2 diabetes mellitus with diabetic chronic kidney disease: Secondary | ICD-10-CM | POA: Diagnosis not present

## 2023-05-06 DIAGNOSIS — Z79899 Other long term (current) drug therapy: Secondary | ICD-10-CM | POA: Diagnosis not present

## 2023-05-06 DIAGNOSIS — I447 Left bundle-branch block, unspecified: Secondary | ICD-10-CM | POA: Insufficient documentation

## 2023-05-06 NOTE — Progress Notes (Signed)
ADVANCED HEART FAILURE FOLLOW UP CLINIC NOTE  Referring Physician: Richardean Chimera, MD  Primary Care: Richardean Chimera, MD Primary Cardiologist:  Chief complaint: Heart failure follow up   HPI: Sonia Morales is a 68 y.o. female with a PMH of HFrEF, CAD manifested by STEMI in 2017 s/p PCI to LAD, NSTEMI c/b cardiogenic shock s/p emergent CABG (LIMA to LAD, SVG to OM) in 2022, and type 2 diabetes.  3/23 Echo: 40-45% (improved from 35% pre-CABG in 9/22) GIDD, diffuse LV regional wall motion hypokinesis   Seen in Cardiology clinic 12/03/22 for 6 month follow up. At the time she had been doing well without recurrence of angina.   She presented to [facility]  11/24 with angina radiating to her jaw and right shoulder. She had taken 3 SL NTG every 5 minutes followed by 5 SL every 10 minutes to get through the night. She had been dyspneic for the past few days with orthopnea. In ED was found to be hypoxic requiring 10L nasal cannula to maintain O2.She received ASA 325 loaded and started on heparin gtt. Labs notable for Trops 8553>10249, K 4.3, BUN/sCr 29/0.96. EKG SR 102 bpm with LBBB and ST depression in leads II and V4-6. CXR with pulmonary edema.Taken for echo/heart cath by Dr. [place]. She was then transferred to the ICU and started on nitroglycerin gtt and Lasix 80 IV TID. Echo 11/24 EF 20-25%, entire septum and apex are akinetic,anterior wall, entire lateral wall, and entire inferior wall are hypokinetic, Rvnl. Mild MR and TR . Cath showed critical left main coronary stenosis (mLM to dLM 95%, ost LAD to pLAD 100%, ostCx to pCx 100%), patent LIMA to LAD, patent SVG to OM, severely elevated LVEDP 42, PCWP 33, PAP 65/33/47, RA 23, CO/CI 4.39/2.33. She was diuresed with significant improvement in her symptoms and discharged.  Echo 12/24: EF 35-40%, LV with RWMA, GIDD. RV mod reduced.   Today she returns for AHF follow up with her sister. Overall feeling ok. Denies palpitations, CP, dizziness, edema, or  PND/Orthopnea. SOB with activity. Appetite ok. No fever or chills. Weight at home 188 pounds. Taking all medications. Denies ETOH, tobacco or drug use. Has grandbabies and great grand babies that keep her busy.   PMH, current medications, allergies, social history, and family history reviewed in epic.    Vitals:   05/06/23 1531  BP: (!) 90/54  Pulse: 83  SpO2: 94%  PHYSICAL EXAM: General:  well appearing.  No respiratory difficulty HEENT: normal Neck: supple. JVD flat. Carotids 2+ bilat; no bruits. No lymphadenopathy or thyromegaly appreciated. Cor: PMI nondisplaced. Regular rate & rhythm. No rubs, gallops or murmurs. Lungs: clear Abdomen: soft, nontender, nondistended. No hepatosplenomegaly. No bruits or masses. Good bowel sounds. Extremities: no cyanosis, clubbing, rash, edema  Neuro: alert & oriented x 3, cranial nerves grossly intact. moves all 4 extremities w/o difficulty. Affect pleasant.   Wt Readings from Last 3 Encounters:  05/06/23 86.7 kg (191 lb 3.2 oz)  05/01/23 86.6 kg (191 lb)  03/22/23 85.4 kg (188 lb 3.2 oz)     DATA REVIEW ECHO: 11/24 LVEF 20 to 25%, severely reduced RV function, no aortic stenosis CATH: 03/06/23: Critical left main coronary stenosis Patent LIMA to the LAD, no significant subclavian gradient by pullback. Patent SVG to OM. Ramus intermediate was not grafted. RCA is without significant disease. Severely elevated LV filling pressures. LVEDP 42 mm Hg. PCWP 36/43 mean 33 mm Hg. Severe elevated pulmonary pressures. PAP 65/33 mean 47 mm  Hg. RA pressure elevated with mean 23 mm Hg. Cardiac output 4.39 L/min, index 2.33   Heart failure review: - Classification: Heart failure with reduced EF - Etiology: Ischemic - NYHA Class: II-IIIa - Volume status: Euvolemic - ACEi/ARB/ARNI: Currently up-titrating - Aldosterone antagonist: Currently up-titrating - Beta-blocker: Currently up-titrating - Digoxin: Not indicated - Hydralazine/Nitrates: Not indicated -  SGLT2i: Intolerant - GLP-1: Consider in future - Advanced therapies: Not needed at this time - ICD: Currently uptitrating GDMT  ASSESSMENT & PLAN: Chronic systolic heart failure: Ischemic cardiomyopathy, previously recovered to 40-45% but 11/24 20-25%. Elevated pressures on admission but improved with aggressive diruesis and GDMT. SBP limits further titration at this time. - NYHA II-IIIa. Appears euvolemic - Echo 12/24: EF 35-40%, LV with RWMA, GIDD. RV mod reduced.  - Continue spironolactone 12.5 mg daily, losartan 12.5 mg daily - Consider beta-blocker at next appointment (BP too soft today for addition of BB or up titration of other meds) - Continue Lasix 40 mg daily - No SGLT2 with recurrent UTIs - Has been referred to cardiac rehab, scheduled appt but missed it. Encouraged her to call and reschedule. She will let us know if we need to place referral in again.  - Plans to complete sleep study soon   CAD with NSTEMI: STEMI in 2017 s/p PCI to LAD, NSTEMI c/b cardiogenic shock s/p emergent CABG (LIMA to LAD, SVG to OM) in 2022. LHC: critical left main coronary stenosis (mLM to dLM 95%, ost LAD to pLAD 100%, ostCx to pCx 100%), patent LIMA to LAD, patent SVG to OM. No chest pain..  - Continue Asprin, Plavix and Lipitor  DMII - Management per PCP  Follow up in 3-4 months with Dr. Sarajane Jews AGACNP-BC  05/06/23

## 2023-05-06 NOTE — Patient Instructions (Signed)
Medication Changes: No Changes In Medications at this time.   Follow-Up in: 3 months with Dr. Elwyn Lade PLEASE CALL OUR OFFICE AROUND march 2025 TO GET SCHEDULED FOR YOUR APPOINTMENT. PHONE NUMBER IS [phone] OPTION 2   At the [facility], you and your health needs are our priority. We have a designated team specialized in the treatment of Heart Failure. This Care Team includes your primary Heart Failure Specialized Cardiologist (physician), Advanced Practice Providers (APPs- Physician Assistants and Nurse Practitioners), and Pharmacist who all work together to provide you with the care you need, when you need it.   You may see any of the following providers on your designated Care Team at your next follow up:  Dr. Arvilla Meres Dr. Marca Ancona Dr. Dorthula Nettles Dr. Theresia Bough Tonye Becket, NP Robbie Lis, [place] [facility] [place], [place] Brynda Peon, NP [place] Lee, NP Karle Plumber, PharmD   Please be sure to bring in all your medications bottles to every appointment.   Need to Contact us:  If you have any questions or concerns before your next appointment please send [place] a message through [place] or call our office at [phone].    TO LEAVE A MESSAGE FOR THE NURSE SELECT OPTION 2, PLEASE LEAVE A MESSAGE INCLUDING: YOUR NAME DATE OF BIRTH CALL BACK NUMBER REASON FOR CALL**this is important as we prioritize the call backs  YOU WILL RECEIVE A CALL BACK THE SAME DAY AS LONG AS YOU CALL BEFORE 4:00 PM

## 2023-05-08 ENCOUNTER — Ambulatory Visit: Payer: Medicare Other | Attending: Emergency Medicine

## 2023-05-08 DIAGNOSIS — I779 Disorder of arteries and arterioles, unspecified: Secondary | ICD-10-CM | POA: Insufficient documentation

## 2023-05-11 DIAGNOSIS — Z6833 Body mass index (BMI) 33.0-33.9, adult: Secondary | ICD-10-CM | POA: Diagnosis not present

## 2023-05-11 DIAGNOSIS — R030 Elevated blood-pressure reading, without diagnosis of hypertension: Secondary | ICD-10-CM | POA: Diagnosis not present

## 2023-05-11 DIAGNOSIS — E669 Obesity, unspecified: Secondary | ICD-10-CM | POA: Diagnosis not present

## 2023-05-11 DIAGNOSIS — J019 Acute sinusitis, unspecified: Secondary | ICD-10-CM | POA: Diagnosis not present

## 2023-06-07 ENCOUNTER — Telehealth: Payer: Self-pay

## 2023-06-07 ENCOUNTER — Telehealth: Payer: Self-pay | Admitting: Cardiology

## 2023-06-07 ENCOUNTER — Telehealth (HOSPITAL_COMMUNITY): Payer: Self-pay | Admitting: Cardiology

## 2023-06-07 NOTE — Telephone Encounter (Signed)
 Pt aware Pharmacy visit 2/27 @ 9

## 2023-06-07 NOTE — Telephone Encounter (Signed)
 Pt c/o BP issue: STAT if pt c/o blurred vision, one-sided weakness or slurred speech  1. What are your last 5 BP readings? 54/40, 78/40, 88/50, did not get any better than this before they let her leave  2. Are you having any other symptoms (ex. Dizziness, headache, blurred vision, passed out)? Dizziness when she gets up to move around  3. What is your BP issue? Low BP  Patient's daughter states the patient went to her PCP and her BP was 54/40 and has been having dizziness. She says the patents PCP wanted to them to contact her cardiologist to see if they could stop the nightly dose of Spironolactone. She says they also suggested stopping the losartan when her top number is below 100. She says the patient is also taking a fluid pill every morning, and one every other evening. She says her weight has been dropping. She says the patient was 188-189 lbs and Monday was 181, Tues 182, Wed 183, and today 184. She says the patient is not SOB or having swelling. She says the patient has been dizzy and yesterday she was tripping when she would get up. She says the patient felt good Wednesday and was up moving around but then got burning in her chest. She says she had no other symptoms. She says the patient did not check her BP when she got hom,e today.  Phone: Lillia Abed [phone]

## 2023-06-07 NOTE — Telephone Encounter (Signed)
 Seen by PCP today B/p today 54/40 and 78/40 -no am meds -weight stable at 181-185  PCP recommended  hold spiro  check b/p daily and hold losartan if SBP less than 100   Please provide additional input if needed  -return for add on?

## 2023-06-07 NOTE — Telephone Encounter (Signed)
 Spoke with pt's granddaughter who called in concerned about her grandmother's blood pressure. The granddaughter stated that her grandmother's systolic blood pressure was in the 50's and that she was experiencing dizziness. The granddaughter was advised to take her grandmother to the ED. Dr. Jacinto Halim will be notified.

## 2023-06-07 NOTE — Telephone Encounter (Signed)
 See telephone encounter.

## 2023-06-10 DIAGNOSIS — G4700 Insomnia, unspecified: Secondary | ICD-10-CM | POA: Diagnosis not present

## 2023-06-10 DIAGNOSIS — M47816 Spondylosis without myelopathy or radiculopathy, lumbar region: Secondary | ICD-10-CM | POA: Diagnosis not present

## 2023-06-10 DIAGNOSIS — G894 Chronic pain syndrome: Secondary | ICD-10-CM | POA: Diagnosis not present

## 2023-06-10 DIAGNOSIS — M47812 Spondylosis without myelopathy or radiculopathy, cervical region: Secondary | ICD-10-CM | POA: Diagnosis not present

## 2023-06-10 NOTE — Progress Notes (Signed)
 Advanced Heart Failure Clinic Note   Referring Physician: Richardean Chimera, MD  Primary Care: Richardean Chimera, MD HF Cardiologist: Dr. Elwyn Lade  HPI:  Sonia Morales is a 68 y.o. female with a PMH of HFrEF, CAD manifested by STEMI in 2017 s/p PCI to LAD, NSTEMI c/b cardiogenic shock s/p emergent CABG (LIMA to LAD, SVG to OM) in 2022, and type 2 diabetes.  06/2021 Echo: 40-45% (improved from 35% pre-CABG in 12/2020) GIDD, diffuse LV regional wall motion hypokinesis.   Seen in Cardiology clinic 12/03/22 for 6 month follow up. At the time she had been doing well without recurrence of angina.   She presented to [facility] 02/2023 with angina radiating to her jaw and right shoulder. She had taken 3 SL NTG every 5 minutes followed by 5 SL every 10 minutes to get through the night. She had been dyspneic for the past few days with orthopnea. In ED was found to be hypoxic requiring 10L nasal cannula to maintain O2. She received ASA 325 loaded and started on heparin gtt. Labs notable for Trops 8553>10249, K 4.3, BUN/sCr 29/0.96. EKG SR 102 bpm with LBBB and ST depression in leads II and V4-6. CXR with pulmonary edema.Taken for echo/heart cath by Dr. [place]. She was then transferred to the ICU and started on nitroglycerin gtt and Lasix 80 mg IV TID. Echo 02/2023 EF 20-25%, entire septum and apex are akinetic,anterior wall, entire lateral wall, and entire inferior wall are hypokinetic, Rv nml. Mild MR and TR . Cath showed critical left main coronary stenosis (mLM to dLM 95%, ost LAD to pLAD 100%, ostCx to pCx 100%), patent LIMA to LAD, patent SVG to OM, severely elevated LVEDP 42, PCWP 33, PAP 65/33/47, RA 23, CO/CI 4.39/2.33. She was diuresed with significant improvement in her symptoms and discharged.   Echo 03/2023: EF 35-40%, LV with RWMA, GIDD. RV mod reduced.    Returned to [facility] for follow up with her sister 05/06/23. Overall was feeling ok. Denied palpitations, CP, dizziness, edema, or PND/Orthopnea. SOB  with activity. Appetite was ok. No fever or chills. Weight at home was 188 pounds. Reported taking all medications. Denied ETOH, tobacco or drug use. Has grandbabies and great grand babies that keep her busy.   Today she returns to HF clinic for pharmacist medication titration. At last visit with APP, no medication changes were made due to soft BP. Unfortunately, her daughter called 06/07/23 to report soft BP at PCP, 54/40 and 78/40. She was instructed to hold losartan and spironolactone and to follow up in one week with pharmacy clinic for BP check. Overall she is feeling well today. Says she feels much better since the spironolactone and the losartan were stopped. Is no longer having constant dizziness, now dizziness is only occasional. Did have an episode of CP last week while her BP was low and it was after doing a lot of chores. This resolved with rest. Has not required any SL NTG. No palpitations. Of note, she is prescribed Lasix 40 mg daily with an extra tablet for weight gain of 3 lbs overnight or 5 lbs on 1 week. In early February, she took a trip to [place] and did not have the ability to weigh herself. [name] (who manages her medications), started giving her an additional Lasix tablet every other night to prevent any fluid gain and has since continued that since her weight was decreasing. Before this change, weight at home was ~185-189 lbs. Since that time, weight  has been 182-185 lbs. No LEE, PND or orthopnea. BP at home has been 105/55 - 119/62 since stopping the losartan and spironolactone. BP in clinic today 112/68. Out of Ozempic for two weeks, PCP working on this, she receives from the manufacturer.    HF Medications: Lasix 40 mg daily  Has the patient been experiencing any side effects to the medications prescribed? Had dizziness last week, has since resolved since stopping losartan and spironolactone.  Does the patient have any problems obtaining medications due to  transportation or finances?   no  Understanding of regimen: good Understanding of indications: good Potential of compliance: good - her medications are managed by [name] Patient understands to avoid NSAIDs. Patient understands to avoid decongestants.    Pertinent Lab Values: 05/11/23: Serum creatinine 0.93, BUN 28, Potassium 4.7, Sodium 140  Vital Signs: Weight: 188.4 lbs (last clinic weight: 191.2 lbs) Blood pressure: 112/68  Heart rate: 81   Assessment/Plan: Chronic systolic heart failure: Ischemic cardiomyopathy, previously recovered to 40-45% but 02/2023 20-25%. Elevated pressures on admission but improved with aggressive diuresis and GDMT. SBP has limited GDMT titration outpatient. - Echo 03/2023: EF 35-40%, LV with RWMA, GIDD. RV mod reduced.  - NYHA II-IIIa. Euvolemic on exam but possibly hypovolemic due to taking extra tablet of Lasix every other night. This could have contributed to significantly low BP last week. - Continue Lasix 40 mg daily and stop taking an extra tablet of Lasix every other day.  - Spironolactone 12.5 mg daily and losartan 12.5 mg daily held recently for soft BP.  - Will plan to see how she adjusts to decreasing the Lasix as above. If BP and volume remain stable, plan will be to follow up in two weeks with pharmacy clinic and try to restart GDMT as a slow titration.  - No SGLT2i with recurrent UTIs - Plans to complete sleep study soon   CAD with NSTEMI: STEMI in 2017 s/p PCI to LAD, NSTEMI c/b cardiogenic shock s/p emergent CABG (LIMA to LAD, SVG to OM) in 2022. LHC: critical left main coronary stenosis (mLM to dLM 95%, ost LAD to pLAD 100%, ostCx to pCx 100%), patent LIMA to LAD, patent SVG to OM. No chest pain..  - Continue Asprin, Plavix and Lipitor   DMII - Management per PCP  Follow up 2 weeks with Pharmacy Clinic.    Karle Plumber, PharmD, BCPS, BCCP, CPP Heart Failure Clinic Pharmacist [phone]

## 2023-06-13 ENCOUNTER — Ambulatory Visit (HOSPITAL_COMMUNITY)
Admission: RE | Admit: 2023-06-13 | Discharge: 2023-06-13 | Disposition: A | Discharge status: Home / Self Care | Payer: Medicare Other | Source: Ambulatory Visit | Attending: Cardiology | Admitting: Cardiology

## 2023-06-13 VITALS — BP 112/68 | HR 81 | Wt 188.4 lb

## 2023-06-13 DIAGNOSIS — I255 Ischemic cardiomyopathy: Secondary | ICD-10-CM | POA: Diagnosis not present

## 2023-06-13 DIAGNOSIS — I252 Old myocardial infarction: Secondary | ICD-10-CM | POA: Insufficient documentation

## 2023-06-13 DIAGNOSIS — E119 Type 2 diabetes mellitus without complications: Secondary | ICD-10-CM | POA: Insufficient documentation

## 2023-06-13 DIAGNOSIS — Z7902 Long term (current) use of antithrombotics/antiplatelets: Secondary | ICD-10-CM | POA: Diagnosis not present

## 2023-06-13 DIAGNOSIS — Z9861 Coronary angioplasty status: Secondary | ICD-10-CM | POA: Diagnosis not present

## 2023-06-13 DIAGNOSIS — Z7982 Long term (current) use of aspirin: Secondary | ICD-10-CM | POA: Insufficient documentation

## 2023-06-13 DIAGNOSIS — I2510 Atherosclerotic heart disease of native coronary artery without angina pectoris: Secondary | ICD-10-CM | POA: Insufficient documentation

## 2023-06-13 DIAGNOSIS — I5022 Chronic systolic (congestive) heart failure: Secondary | ICD-10-CM | POA: Insufficient documentation

## 2023-06-13 LAB — BASIC METABOLIC PANEL
Anion gap: 11 (ref 5–15)
BUN: 38 mg/dL — ABNORMAL HIGH (ref 8–23)
CO2: 27 mmol/L (ref 22–32)
Calcium: 9.6 mg/dL (ref 8.9–10.3)
Chloride: 101 mmol/L (ref 98–111)
Creatinine, Ser: 1.12 mg/dL — ABNORMAL HIGH (ref 0.44–1.00)
GFR, Estimated: 54 mL/min — ABNORMAL LOW (ref >60)
Glucose, Bld: 253 mg/dL — ABNORMAL HIGH (ref 70–99)
Potassium: 4.5 mmol/L (ref 3.5–5.1)
Sodium: 139 mmol/L (ref 135–145)

## 2023-06-13 LAB — BRAIN NATRIURETIC PEPTIDE: B Natriuretic Peptide: 127.5 pg/mL — ABNORMAL HIGH (ref 0.0–100.0)

## 2023-06-13 NOTE — Patient Instructions (Signed)
 It was a pleasure seeing you today!  MEDICATIONS: -Change Lasix to 40 mg daily with an extra as needed for weight gain of 3 lbs overnight or 5 lbs in 1 week. Stop the extra table every other day. -Call if you have questions about your medications.  LABS: -We will call you if your labs need attention.  NEXT APPOINTMENT: Return to clinic in 2 weeks with Pharmacy Clinic.  In general, to take care of your heart failure: -Limit your fluid intake to 2 Liters (half-gallon) per day.   -Limit your salt intake to ideally 2-3 grams (2000-3000 mg) per day. -Weigh yourself daily and record, and bring that "weight diary" to your next appointment.  (Weight gain of 2-3 pounds in 1 day typically means fluid weight.) -The medications for your heart are to help your heart and help you live longer.   -Please contact us before stopping any of your heart medications.  Call the clinic at [phone] with questions or to reschedule future appointments.

## 2023-06-14 DIAGNOSIS — E119 Type 2 diabetes mellitus without complications: Secondary | ICD-10-CM | POA: Diagnosis not present

## 2023-06-14 DIAGNOSIS — I10 Essential (primary) hypertension: Secondary | ICD-10-CM | POA: Diagnosis not present

## 2023-06-14 DIAGNOSIS — F331 Major depressive disorder, recurrent, moderate: Secondary | ICD-10-CM | POA: Diagnosis not present

## 2023-06-20 ENCOUNTER — Telehealth (HOSPITAL_COMMUNITY): Payer: Self-pay | Admitting: Cardiology

## 2023-06-20 NOTE — Telephone Encounter (Signed)
   Please call. Instruct to take an extra lasix today. Avoid high sodium foods.   No other changes.   Izaac Reisig NP-C  3:13 PM

## 2023-06-20 NOTE — Telephone Encounter (Signed)
 Pt and pts grand daughter aware

## 2023-06-20 NOTE — Telephone Encounter (Signed)
 Grand daughter called to report pts increase in SOB since making medication changes  Current weight 191 last week 186 Mild LE swelling(shoes are tight)  Current dose lasix 40 mg daily   Denies CP,dizziness   Spiro 12.5 daily,losartan 12.5 daily and PM dose of lasix every other night HELD at pharm visit 2/27 secondary to low b/p    -would like to know if medication adjustments are needed prior to follow up 3/13

## 2023-06-21 ENCOUNTER — Encounter (HOSPITAL_COMMUNITY): Payer: Self-pay

## 2023-06-24 ENCOUNTER — Other Ambulatory Visit (HOSPITAL_COMMUNITY): Payer: Self-pay

## 2023-06-25 NOTE — Progress Notes (Incomplete)
 Advanced Heart Failure Clinic Note   Referring Physician: Richardean Chimera, MD  Primary Care: Sonia Chimera, MD HF Cardiologist: Dr. Elwyn Morales  HPI:  Sonia Morales is a 68 y.o. female with a PMH of HFrEF, CAD manifested by STEMI in 2017 s/p PCI to LAD, NSTEMI c/b cardiogenic shock s/p emergent CABG (LIMA to LAD, SVG to OM) in 2022, and type 2 diabetes.  06/2021 Echo: 40-45% (improved from 35% pre-CABG in 12/2020) GIDD, diffuse LV regional wall motion hypokinesis.   Seen in Cardiology clinic 12/03/22 for 6 month follow up. At the time she had been doing well without recurrence of angina.   She presented to [facility] 02/2023 with angina radiating to her jaw and right shoulder. She had taken 3 SL NTG every 5 minutes followed by 5 SL every 10 minutes to get through the night. She had been dyspneic for the past few days with orthopnea. In ED was found to be hypoxic requiring 10L nasal cannula to maintain O2. She received ASA 325 loaded and started on heparin gtt. Labs notable for Trops 8553>10249, K 4.3, BUN/sCr 29/0.96. EKG SR 102 bpm with LBBB and ST depression in leads II and V4-6. CXR with pulmonary edema.Taken for echo/heart cath by Dr. [place]. She was then transferred to the ICU and started on nitroglycerin gtt and Lasix 80 mg IV TID. Echo 02/2023 EF 20-25%, entire septum and apex are akinetic,anterior wall, entire lateral wall, and entire inferior wall are hypokinetic, Rv nml. Mild MR and TR . Cath showed critical left main coronary stenosis (mLM to dLM 95%, ost LAD to pLAD 100%, ostCx to pCx 100%), patent LIMA to LAD, patent SVG to OM, severely elevated LVEDP 42, PCWP 33, PAP 65/33/47, RA 23, CO/CI 4.39/2.33. She was diuresed with significant improvement in her symptoms and discharged.   Echo 03/2023: EF 35-40%, LV with RWMA, GIDD. RV mod reduced.    Returned to [facility] for follow up with her sister 05/06/23. Overall was feeling ok. Denied palpitations, CP, dizziness, edema, or PND/Orthopnea. SOB  with activity. Appetite was ok. No fever or chills. Weight at home was 188 pounds. Reported taking all medications. Denied ETOH, tobacco or drug use. Has grandbabies and great grand babies that keep her busy. After visit, patient's daughter reported soft BP at PCP appointment (54/40 and 78/40) and she was instructed to hold losartan and spironolactone and follow-up with pharmacy clinic. At last pharmacy appointment on 06/13/23, she was feeling well and constant dizziness had resolved with holding ARB and MRA. Reported home BP of 105/55 - 119/62. In early February on a trip to [place], her granddaughter Sonia Morales) had starting dosing Lasix 40 mg daily with an extra tablet every other night to prevent fluid accumulation and had since continued that dosing. She was instructed to resume Lasix 40 mg daily and return in 2 weeks for reevaluation for slow titration of GDMT, if BP improves with decreased diuretic dose. However, granddaughter called on 06/20/23 to report weight increase in one week from 186 lbs to 191 lbs and increase in ShOB - instructed to take extra Lasix 40 mg daily x1.   Today he returns to HF clinic for pharmacist medication titration. At last visit with pharmacy, patient was instructed to decrease Lasix to 40 mg daily from Lasix 40 mg daily, with extra 40 mg tab every other night in the setting of soft BP. ***   Notes:  Prev on Entresto 24-26 in 2022 - held (along with carvedilol) for medication induced  hypotension Prev on Jardiance 25 in Feb 2024- held for recurrent yeast infections (investigate what A1c was at that time?? - would be helpful for fluid without much BP lowering) - could potentially try 10 mg dose? - A1c in Nov 2024 - 8.0/7.9 No part D??  Try to start low dose spiro for diuresis if BP stable?  At least BNP today - maybe BMET, probably not necessary (but did have Cr bump last time)  Overall feeling ***. Dizziness, lightheadedness, fatigue:  Chest pain or palpitations:  How is  your breathing?: *** SOB: Able to complete all ADLs. Activity level ***  Weight at home pounds. Takes furosemide/torsemide/bumex *** mg *** daily.  LEE PND/Orthopnea  Appetite *** Low-salt diet:   Physical Exam Cost/affordability of meds    HF Medications: Lasix 40 mg daily  Has the patient been experiencing any side effects to the medications prescribed? Had dizziness last week, has since resolved since stopping losartan and spironolactone.***  Does the patient have any problems obtaining medications due to transportation or finances?   no  Understanding of regimen: good Understanding of indications: good Potential of compliance: good - her medications are managed by her granddaughter Sonia Morales Patient understands to avoid NSAIDs. Patient understands to avoid decongestants.    Pertinent Lab Values: 05/11/23: Serum creatinine 0.93, BUN 28, Potassium 4.7, Sodium 140 06/13/23: Serum creatinine 1.12, BUN 38, Potassium 4.5, Sodium 139, BNP 127.5  Vital Signs: Weight: 188.4 lbs (last clinic weight: 191.2 lbs) Blood pressure: 112/68  Heart rate: 81   Assessment/Plan: Chronic systolic heart failure: Ischemic cardiomyopathy, previously recovered to 40-45% but 02/2023 20-25%. Elevated pressures on admission but improved with aggressive diuresis and GDMT. SBP has limited GDMT titration outpatient. - Echo 03/2023: EF 35-40%, LV with RWMA, GIDD. RV mod reduced.  - NYHA II-IIIa. Euvolemic on exam but possibly hypovolemic due to taking extra tablet of Lasix every other night. This could have contributed to significantly low BP last week. - Continue Lasix 40 mg daily and stop taking an extra tablet of Lasix every other day.  - Spironolactone 12.5 mg daily and losartan 12.5 mg daily held recently for soft BP.  - Will plan to see how she adjusts to decreasing the Lasix as above. If BP and volume remain stable, plan will be to follow up in two weeks with pharmacy clinic and try to restart GDMT  as a slow titration.  - No SGLT2i with recurrent UTIs - Plans to complete sleep study soon   CAD with NSTEMI: STEMI in 2017 s/p PCI to LAD, NSTEMI c/b cardiogenic shock s/p emergent CABG (LIMA to LAD, SVG to OM) in 2022. LHC: critical left main coronary stenosis (mLM to dLM 95%, ost LAD to pLAD 100%, ostCx to pCx 100%), patent LIMA to LAD, patent SVG to OM. No chest pain..  - Continue Asprin, Plavix and Lipitor   DMII - Management per PCP  Follow up 2 weeks with Pharmacy Clinic.   Nils Pyle, PharmD PGY1 Pharmacy Resident  Karle Plumber, PharmD, BCPS, BCCP, CPP Heart Failure Clinic Pharmacist [phone]

## 2023-06-26 ENCOUNTER — Ambulatory Visit (HOSPITAL_COMMUNITY)
Admission: RE | Admit: 2023-06-26 | Discharge: 2023-06-26 | Disposition: A | Discharge status: Home / Self Care | Payer: Medicare Other | Source: Ambulatory Visit | Attending: Cardiology | Admitting: Cardiology

## 2023-06-26 VITALS — BP 120/68 | HR 72 | Wt 188.4 lb

## 2023-06-26 DIAGNOSIS — Z951 Presence of aortocoronary bypass graft: Secondary | ICD-10-CM | POA: Insufficient documentation

## 2023-06-26 DIAGNOSIS — I5043 Acute on chronic combined systolic (congestive) and diastolic (congestive) heart failure: Secondary | ICD-10-CM | POA: Diagnosis not present

## 2023-06-26 DIAGNOSIS — I2510 Atherosclerotic heart disease of native coronary artery without angina pectoris: Secondary | ICD-10-CM | POA: Diagnosis not present

## 2023-06-26 DIAGNOSIS — I255 Ischemic cardiomyopathy: Secondary | ICD-10-CM | POA: Diagnosis not present

## 2023-06-26 DIAGNOSIS — Z79899 Other long term (current) drug therapy: Secondary | ICD-10-CM | POA: Diagnosis not present

## 2023-06-26 DIAGNOSIS — I5022 Chronic systolic (congestive) heart failure: Secondary | ICD-10-CM | POA: Insufficient documentation

## 2023-06-26 DIAGNOSIS — Z955 Presence of coronary angioplasty implant and graft: Secondary | ICD-10-CM | POA: Insufficient documentation

## 2023-06-26 DIAGNOSIS — I5023 Acute on chronic systolic (congestive) heart failure: Secondary | ICD-10-CM

## 2023-06-26 DIAGNOSIS — I252 Old myocardial infarction: Secondary | ICD-10-CM | POA: Diagnosis not present

## 2023-06-26 DIAGNOSIS — E119 Type 2 diabetes mellitus without complications: Secondary | ICD-10-CM | POA: Insufficient documentation

## 2023-06-26 MED ORDER — SPIRONOLACTONE 25 MG PO TABS: 25.0000 mg | ORAL_TABLET | Freq: Every day | ORAL | 3 refills | Status: DC

## 2023-06-26 NOTE — Patient Instructions (Signed)
 It was a pleasure seeing you today!  MEDICATIONS: -We are changing your medications today -Start spironolactone 25 mg once daily. You can take this before bed to prevent dizziness during the day. -Continue Lasix 40 mg once daily. Take an extra 40 mg if you have a 3 lbs weight gain in one day or 5 lbs weight gain in one week.  -Call if you have questions about your medications.  LABS: -We will call you if your labs need attention.  NEXT APPOINTMENT: Return to clinic in 4-6 weeks for follow-up with one of our PA/NPs  In general, to take care of your heart failure: -Limit your fluid intake to 2 Liters (half-gallon) per day.   -Limit your salt intake to ideally 2-3 grams (2000-3000 mg) per day. -Weigh yourself daily and record, and bring that "weight diary" to your next appointment.  (Weight gain of 2-3 pounds in 1 day typically means fluid weight.) -The medications for your heart are to help your heart and help you live longer.   -Please contact us before stopping any of your heart medications.  Call the clinic at [phone] with questions or to reschedule future appointments.

## 2023-06-26 NOTE — Progress Notes (Signed)
 Advanced Heart Failure Clinic Note   Referring Physician: Richardean Chimera, MD  Primary Care: Richardean Chimera, MD HF Cardiologist: Dr. Elwyn Lade  HPI:  Sonia Morales is a 68 y.o. female with a PMH of HFrEF, CAD manifested by STEMI in 2017 s/p PCI to LAD, NSTEMI c/b cardiogenic shock s/p emergent CABG (LIMA to LAD, SVG to OM) in 2022, and type 2 diabetes.  06/2021 Echo: 40-45% (improved from 35% pre-CABG in 12/2020) GIDD, diffuse LV regional wall motion hypokinesis.   Seen in Cardiology clinic 12/03/22 for 6 month follow up. At the time she had been doing well without recurrence of angina.   She presented to [facility] 02/2023 with angina radiating to her jaw and right shoulder. She had taken 3 SL NTG every 5 minutes followed by 5 SL every 10 minutes to get through the night. She had been dyspneic for the past few days with orthopnea. In ED was found to be hypoxic requiring 10L nasal cannula to maintain O2. She received ASA 325 loaded and started on heparin gtt. Labs notable for Trops 8553>10249, K 4.3, BUN/sCr 29/0.96. EKG SR 102 bpm with LBBB and ST depression in leads II and V4-6. CXR with pulmonary edema.Taken for echo/heart cath by Dr. [place]. She was then transferred to the ICU and started on nitroglycerin gtt and Lasix 80 mg IV TID. Echo 02/2023 EF 20-25%, entire septum and apex are akinetic,anterior wall, entire lateral wall, and entire inferior wall are hypokinetic, Rv nml. Mild MR and TR . Cath showed critical left main coronary stenosis (mLM to dLM 95%, ost LAD to pLAD 100%, ostCx to pCx 100%), patent LIMA to LAD, patent SVG to OM, severely elevated LVEDP 42, PCWP 33, PAP 65/33/47, RA 23, CO/CI 4.39/2.33. She was diuresed with significant improvement in her symptoms and discharged. Echo Dec 2024, EF improved to 35-40%.    Echo 03/2023: EF 35-40%, LV with RWMA, GIDD. RV mod reduced.    Returned to [facility] for follow up with her sister 05/06/23. Overall was feeling ok. Denied palpitations, CP,  dizziness, edema, or PND/Orthopnea. SOB with activity. Appetite was ok. No fever or chills. Weight at home was 188 pounds. Reported taking all medications. Denied ETOH, tobacco or drug use. Has grandbabies and great grand babies that keep her busy. After visit, patient's daughter reported soft BP at PCP appointment (54/40 and 78/40) and she was instructed to hold losartan and spironolactone and follow-up with pharmacy clinic. At last pharmacy appointment on 06/13/23, she was feeling well and constant dizziness had resolved with holding ARB and MRA. Reported home BP of 105/55 - 119/62. In early February on a trip to [place], [name]) had starting dosing Lasix 40 mg daily with an extra tablet every other night to prevent fluid accumulation and had since continued that dosing. She was instructed to resume Lasix 40 mg daily and return in 2 weeks for reevaluation for slow titration of GDMT, if BP improves with decreased diuretic dose. However, granddaughter called on 06/20/23 to report weight increase in one week from 186 lbs to 191 lbs and increase in SOB - instructed to take extra Lasix 40 mg daily x1.   Today she returns to HF clinic for pharmacist medication titration. At last visit with pharmacy, patient was instructed to decrease Lasix to 40 mg daily from Lasix 40 mg daily, with extra 40 mg tab every other night in the setting of soft BP. Overall, patient reports feeling poorly today. She presents with her granddaughter  [name] who manages her medications. She reports dizziness, lightheadedness every day. It is not related to positional changes. She denies CP, palpitations - but does endorse some "chest burning" when she is cleaning or exerting herself, which resolves with rest. She endorses worsening SOB of the past couple of weeks. She is using oxygen for 1-2 hours every day, and occasionally for brief spurts during the night. She reports that she is SOB at rest - but she is able to walk around  and complete her ADLs. She is taking daily weights at home. She has needed an extra tablet of Lasix 40 mg about once/week over the past two weeks. She reports that she does have a diuretic response to this dose of Lasix. She has noticed that she is holding more fluid in her abdomen, but appetite is good. On exam, very minimal edema in R ankle only. Reports shoes were fitting tighter a couple of days ago. Denies PND, orthopnea. She is sleeping directly on two pillows (hx of neck fracture, so feels this is also for support). Unsure whether she is able to lie flat without SOB. She does add salt to her foods - but reports that salt intake is stable/unchanged. She is still waiting for shipment of Ozempic from the manufacturer (has been out for 3 weeks).   Reports recent home BP over the past week: 109/60, 116/55, 108/59, 121/56, 119/59, 121/55, 111/45   HF Medications: Lasix 40 mg daily  Has the patient been experiencing any side effects to the medications prescribed? No - continues to have dizziness/lightheadedness  Does the patient have any problems obtaining medications due to transportation or finances?   No  Understanding of regimen: good Understanding of indications: good Potential of compliance: good - her medications are managed by [name] Patient understands to avoid NSAIDs. Patient understands to avoid decongestants.    Pertinent Lab Values: 05/11/23: Serum creatinine 0.93, BUN 28, Potassium 4.7, Sodium 140 06/13/23: Serum creatinine 1.12, BUN 38, Potassium 4.5, Sodium 139, BNP 127.5  Vital Signs: Weight: 188.4 lbs (last clinic weight: 191.2 lbs) Blood pressure: 120/68 Heart rate: 71 SpO2: 91%  Assessment/Plan: Chronic systolic heart failure: Ischemic cardiomyopathy, previously recovered to 40-45% but 03/2023 35-40% with dilated LV and moderately reduced RV. Elevated pressures during Nov 2024 admission but improved with aggressive diuresis and GDMT. SBP has limited  GDMT titration outpatient. Patient appears hypervolemic on exam with reports of increased SOB and fluid retention in her abdomen. BP WNL today - would like to add spironolactone for additional diuresis to reduce frequency of extra Lasix dose. Last BMP demonstrated stable Scr and potassium WNL. Suspect there may be an underlying contributor to persistent dizziness as recent home BP are mostly WNL. Intermittent oxygen requirement and SpO2 of 91% is concerning for pulmonary edema and possible underlying lung disease.  - Echo 03/2023: EF 35-40%, LV with RWMA, GIDD. RV mod reduced.  - NYHA III-IV.  - Continue Lasix 40 mg daily with extra 40 mg for 3 lbs weight gain in one day or 5 lbs weight gain in one week - Start spironolactone 25 mg daily. Recommend dosing at night to prevent excess dizziness during the day.  - Unable to restart ARB with soft BP - With possible hypervolemia and reduced RV function, will defer initiation of beta-blocker - No SGLT2i with recurrent UTIs - though could consider retrial if A1c is controlled - Will follow-up with repeat BMET and BNP in one week. Recommend APP clinic in 4-6 weeks given volume  issues and oxygen requirement  CAD with NSTEMI: STEMI in 2017 s/p PCI to LAD, NSTEMI c/b cardiogenic shock s/p emergent CABG (LIMA to LAD, SVG to OM) in 2022. LHC: critical left main coronary stenosis (mLM to dLM 95%, ost LAD to pLAD 100%, ostCx to pCx 100%), patent LIMA to LAD, patent SVG to OM. No chest pain..  - Continue Asprin, Plavix and Lipitor   DMII - Management per PCP  Follow up  - BMET/BNP in 1 week (07/03/23) - APP clinic 4-6 weeks (07/31/23)  Nils Pyle, PharmD PGY1 Pharmacy Resident  Karle Plumber, PharmD, BCPS, BCCP, CPP Heart Failure Clinic Pharmacist [phone]

## 2023-06-27 MED ORDER — PROPOFOL 1000 MG/100ML IV EMUL
INTRAVENOUS | Status: AC
  Filled 2023-06-27: qty 200

## 2023-06-27 MED ORDER — PHENYLEPHRINE HCL-NACL 20-0.9 MG/250ML-% IV SOLN
INTRAVENOUS | Status: AC
Start: 2023-06-27 — End: ?
  Filled 2023-06-27: qty 500

## 2023-06-28 NOTE — Addendum Note (Signed)
 Encounter addended by: Howell Rucks, RDCS on: 06/28/2023 2:21 PM  Actions taken: Imaging Exam ended

## 2023-07-03 ENCOUNTER — Ambulatory Visit (HOSPITAL_COMMUNITY)
Admission: RE | Admit: 2023-07-03 | Discharge: 2023-07-03 | Disposition: A | Discharge status: Home / Self Care | Source: Ambulatory Visit | Attending: Cardiology | Admitting: Cardiology

## 2023-07-03 DIAGNOSIS — I5023 Acute on chronic systolic (congestive) heart failure: Secondary | ICD-10-CM | POA: Diagnosis not present

## 2023-07-03 LAB — BASIC METABOLIC PANEL
Anion gap: 6 (ref 5–15)
BUN: 40 mg/dL — ABNORMAL HIGH (ref 8–23)
CO2: 29 mmol/L (ref 22–32)
Calcium: 9.2 mg/dL (ref 8.9–10.3)
Chloride: 103 mmol/L (ref 98–111)
Creatinine, Ser: 1.38 mg/dL — ABNORMAL HIGH (ref 0.44–1.00)
GFR, Estimated: 42 mL/min — ABNORMAL LOW (ref >60)
Glucose, Bld: 216 mg/dL — ABNORMAL HIGH (ref 70–99)
Potassium: 4.1 mmol/L (ref 3.5–5.1)
Sodium: 138 mmol/L (ref 135–145)

## 2023-07-03 LAB — BRAIN NATRIURETIC PEPTIDE: B Natriuretic Peptide: 172.2 pg/mL — ABNORMAL HIGH (ref 0.0–100.0)

## 2023-07-04 DIAGNOSIS — H25812 Combined forms of age-related cataract, left eye: Secondary | ICD-10-CM | POA: Diagnosis not present

## 2023-07-05 ENCOUNTER — Encounter (INDEPENDENT_AMBULATORY_CARE_PROVIDER_SITE_OTHER): Payer: Self-pay | Admitting: Cardiology

## 2023-07-05 DIAGNOSIS — G4733 Obstructive sleep apnea (adult) (pediatric): Secondary | ICD-10-CM

## 2023-07-09 ENCOUNTER — Ambulatory Visit: Attending: Cardiology

## 2023-07-09 DIAGNOSIS — I5022 Chronic systolic (congestive) heart failure: Secondary | ICD-10-CM

## 2023-07-09 NOTE — Procedures (Signed)
    SLEEP STUDY REPORT Patient Information Study Date: 07/05/2023 Patient Name: Sonia Morales Patient ID: [id number] Birth Date: July 14, 1955 Age: 68 Gender: Female BMI: 33.2 (W=187 lb, H=5' 3'') Stopbang: 4 Referring Physician: Elie Confer, MD  TEST DESCRIPTION: Home sleep apnea testing was completed using the WatchPat, a Type 1 device, utilizing  peripheral arterial tonometry (PAT), chest movement, actigraphy, pulse oximetry, pulse rate, body position and snore.  AHI was calculated with apnea and hypopnea using valid sleep time as the denominator. RDI includes apneas,  hypopneas, and RERAs. The data acquired and the scoring of sleep and all associated events were performed in  accordance with the recommended standards and specifications as outlined in the AASM Manual for the Scoring of  Sleep and Associated Events 2.2.0 (2015).  FINDINGS:  1. Severe Obstructive Sleep Apnea with AHI 51.8/hr.   2. No Central Sleep Apnea with pAHIc 4.5/hr.  3. Oxygen desaturations as low as 69%.  4. Moderate to severe snoring was present. O2 sats were < 88% for 465.3 min.  5. Total sleep time was 7 hrs and 50 min.  6. 24.8% of total sleep time was spent in REM sleep.   7. Normal sleep onset latency at 15 min.   8. Prolonged REM sleep onset latency at 127 min.   9. Total awakenings were 13.  10. Arrhythmia detection: None  DIAGNOSIS:  Severe Obstructive Sleep Apnea (G47.33) Nocturnal Hypoxemia  RECOMMENDATIONS: 1. Clinical correlation of these findings is necessary. The decision to treat obstructive sleep apnea (OSA) is usually  based on the presence of apnea symptoms or the presence of associated medical conditions such as Hypertension,  Congestive Heart Failure, Atrial Fibrillation or Obesity. The most common symptoms of OSA are snoring, gasping for  breath while sleeping, daytime sleepiness and fatigue.   2. Initiating apnea therapy is recommended given the presence of symptoms and/or  associated conditions.  Recommend proceeding with one of the following:   a. Auto-CPAP therapy with a pressure range of 5-20cm H2O.   b. An oral appliance (OA) that can be obtained from certain dentists with expertise in sleep medicine. These are  primarily of use in non-obese patients with mild and moderate disease.   c. An ENT consultation which may be useful to look for specific causes of obstruction and possible treatment  options.   d. If patient is intolerant to PAP therapy, consider referral to ENT for evaluation for hypoglossal nerve stimulator.   3. Close follow-up is necessary to ensure success with CPAP or oral appliance therapy for maximum benefit .  4. A follow-up oximetry study on CPAP is recommended to assess the adequacy of therapy and determine the need  for supplemental oxygen or the potential need for Bi-level therapy. An arterial blood gas to determine the adequacy of  baseline ventilation and oxygenation should also be considered.  5. Healthy sleep recommendations include: adequate nightly sleep (normal 7-9 hrs/night), avoidance of caffeine after  noon and alcohol near bedtime, and maintaining a sleep environment that is cool, dark and quiet.  6. Weight loss for overweight patients is recommended. Even modest amounts of weight loss can significantly  improve the severity of sleep apnea.  7. Snoring recommendations include: weight loss where appropriate, side sleeping, and avoidance of alcohol before  bed.  8. Operation of motor vehicle should be avoided when sleepy.  Signature: Armanda Magic, MD; [facility]; Diplomat, American Board of Sleep  Medicine Electronically Signed: 07/09/2023 9:50:19 AM

## 2023-07-10 DIAGNOSIS — M47816 Spondylosis without myelopathy or radiculopathy, lumbar region: Secondary | ICD-10-CM | POA: Diagnosis not present

## 2023-07-10 DIAGNOSIS — M47812 Spondylosis without myelopathy or radiculopathy, cervical region: Secondary | ICD-10-CM | POA: Diagnosis not present

## 2023-07-10 DIAGNOSIS — G894 Chronic pain syndrome: Secondary | ICD-10-CM | POA: Diagnosis not present

## 2023-07-10 DIAGNOSIS — G4700 Insomnia, unspecified: Secondary | ICD-10-CM | POA: Diagnosis not present

## 2023-07-12 ENCOUNTER — Telehealth: Payer: Self-pay | Admitting: *Deleted

## 2023-07-12 DIAGNOSIS — Z1329 Encounter for screening for other suspected endocrine disorder: Secondary | ICD-10-CM | POA: Diagnosis not present

## 2023-07-12 DIAGNOSIS — Z1321 Encounter for screening for nutritional disorder: Secondary | ICD-10-CM | POA: Diagnosis not present

## 2023-07-12 DIAGNOSIS — I10 Essential (primary) hypertension: Secondary | ICD-10-CM | POA: Diagnosis not present

## 2023-07-12 DIAGNOSIS — E7849 Other hyperlipidemia: Secondary | ICD-10-CM | POA: Diagnosis not present

## 2023-07-12 DIAGNOSIS — E1142 Type 2 diabetes mellitus with diabetic polyneuropathy: Secondary | ICD-10-CM | POA: Diagnosis not present

## 2023-07-12 DIAGNOSIS — G4733 Obstructive sleep apnea (adult) (pediatric): Secondary | ICD-10-CM

## 2023-07-12 DIAGNOSIS — E559 Vitamin D deficiency, unspecified: Secondary | ICD-10-CM | POA: Diagnosis not present

## 2023-07-12 DIAGNOSIS — E1165 Type 2 diabetes mellitus with hyperglycemia: Secondary | ICD-10-CM | POA: Diagnosis not present

## 2023-07-12 DIAGNOSIS — E782 Mixed hyperlipidemia: Secondary | ICD-10-CM | POA: Diagnosis not present

## 2023-07-12 NOTE — Telephone Encounter (Signed)
 The patient has been notified of the result via telephone and mychart.Latrelle Dodrill, CMA 07/12/2023 2:21 PM

## 2023-07-12 NOTE — Telephone Encounter (Signed)
-----   Message from Armanda Magic sent at 07/09/2023  9:51 AM EDT ----- Please let patient know that they have sleep apnea.  Recommend therapeutic CPAP titration for treatment of patient's sleep disordered breathing.

## 2023-07-15 DIAGNOSIS — E1122 Type 2 diabetes mellitus with diabetic chronic kidney disease: Secondary | ICD-10-CM | POA: Diagnosis not present

## 2023-07-15 DIAGNOSIS — R351 Nocturia: Secondary | ICD-10-CM | POA: Diagnosis not present

## 2023-07-15 DIAGNOSIS — I10 Essential (primary) hypertension: Secondary | ICD-10-CM | POA: Diagnosis not present

## 2023-07-16 NOTE — Telephone Encounter (Signed)
 The patient has been notified of the result and verbalized understanding.  All questions (if any) were answered. Latrelle Dodrill, CMA 07/16/2023 10:44 AM    Will precert titration

## 2023-07-17 DIAGNOSIS — Z1389 Encounter for screening for other disorder: Secondary | ICD-10-CM | POA: Diagnosis not present

## 2023-07-17 DIAGNOSIS — E782 Mixed hyperlipidemia: Secondary | ICD-10-CM | POA: Diagnosis not present

## 2023-07-17 DIAGNOSIS — Z1331 Encounter for screening for depression: Secondary | ICD-10-CM | POA: Diagnosis not present

## 2023-07-17 DIAGNOSIS — Z0001 Encounter for general adult medical examination with abnormal findings: Secondary | ICD-10-CM | POA: Diagnosis not present

## 2023-07-17 DIAGNOSIS — Z6834 Body mass index (BMI) 34.0-34.9, adult: Secondary | ICD-10-CM | POA: Diagnosis not present

## 2023-07-17 DIAGNOSIS — E7849 Other hyperlipidemia: Secondary | ICD-10-CM | POA: Diagnosis not present

## 2023-07-17 DIAGNOSIS — E1165 Type 2 diabetes mellitus with hyperglycemia: Secondary | ICD-10-CM | POA: Diagnosis not present

## 2023-07-17 DIAGNOSIS — F331 Major depressive disorder, recurrent, moderate: Secondary | ICD-10-CM | POA: Diagnosis not present

## 2023-07-17 DIAGNOSIS — Z0000 Encounter for general adult medical examination without abnormal findings: Secondary | ICD-10-CM | POA: Diagnosis not present

## 2023-07-17 DIAGNOSIS — I10 Essential (primary) hypertension: Secondary | ICD-10-CM | POA: Diagnosis not present

## 2023-07-22 ENCOUNTER — Other Ambulatory Visit (HOSPITAL_COMMUNITY): Payer: Self-pay | Admitting: Cardiology

## 2023-07-22 NOTE — Telephone Encounter (Signed)
 Prior Authorization for titration sent to medicare via web portal. Tracking Number . NO PA REQ

## 2023-07-30 ENCOUNTER — Telehealth (HOSPITAL_COMMUNITY): Payer: Self-pay | Admitting: *Deleted

## 2023-07-30 NOTE — Telephone Encounter (Signed)
 Called to confirm/remind patient of their appointment at the [facility] on 07/19/23.    Appointment:              [] Confirmed             [x] Left mess              [] No answer/No voice mail             [] Phone not in service   Patient reminded to bring all medications and/or complete list.   Confirmed patient has transportation. Gave directions, instructed to utilize valet parking.

## 2023-07-31 ENCOUNTER — Encounter (HOSPITAL_COMMUNITY)

## 2023-08-08 DIAGNOSIS — M47812 Spondylosis without myelopathy or radiculopathy, cervical region: Secondary | ICD-10-CM | POA: Diagnosis not present

## 2023-08-08 DIAGNOSIS — G894 Chronic pain syndrome: Secondary | ICD-10-CM | POA: Diagnosis not present

## 2023-08-08 DIAGNOSIS — G4700 Insomnia, unspecified: Secondary | ICD-10-CM | POA: Diagnosis not present

## 2023-08-08 DIAGNOSIS — M47816 Spondylosis without myelopathy or radiculopathy, lumbar region: Secondary | ICD-10-CM | POA: Diagnosis not present

## 2023-08-09 ENCOUNTER — Telehealth (HOSPITAL_COMMUNITY): Payer: Self-pay

## 2023-08-09 NOTE — Telephone Encounter (Signed)
 Called to confirm/remind patient of their appointment at the [facility] on 08/12/2023 2:30.   Appointment:   [x] Confirmed  [] Left mess   [] No answer/No voice mail  [] VM Full/unable to leave message  [] Phone not in service  Patient reminded to bring all medications and/or complete list.  Confirmed patient has transportation. Gave directions, instructed to utilize valet parking.

## 2023-08-11 NOTE — Progress Notes (Signed)
 ADVANCED HEART FAILURE FOLLOW UP CLINIC NOTE  Referring Physician: Leesa Pulling, MD  Primary Care: Leesa Pulling, MD Primary Cardiologist: Dr. Berry Bristol HF MD: Dr. Alease Amend   Chief complaint: f/u for systolic heart failure    HPI: Sonia Morales is a 68 y.o. female with a PMH of HFrEF, CAD manifested by STEMI in 2017 s/p PCI to LAD, NSTEMI c/b cardiogenic shock s/p emergent CABG (LIMA to LAD, SVG to OM) in 2022, and type 2 diabetes.  3/23 Echo: 40-45% (improved from 35% pre-CABG in 9/22) GIDD, diffuse LV regional wall motion hypokinesis   Seen in Cardiology clinic 12/03/22 for 6 month follow up. At the time she had been doing well without recurrence of angina.   She presented to [facility]  11/24 with angina radiating to her jaw and right shoulder. She had taken 3 SL NTG every 5 minutes followed by 5 SL every 10 minutes to get through the night. She had been dyspneic for the past few days with orthopnea. In ED was found to be hypoxic requiring 10L nasal cannula to maintain O2.She received ASA 325 loaded and started on heparin  gtt. Labs notable for Trops 8553>10249, K 4.3, BUN/sCr 29/0.96. EKG SR 102 bpm with LBBB and ST depression in leads II and V4-6. CXR with pulmonary edema.Taken for echo/heart cath by Dr. [place]. She was then transferred to the ICU and started on nitroglycerin  gtt and Lasix  80 IV TID. Echo 11/24 EF 20-25%, entire septum and apex are akinetic,anterior wall, entire lateral wall, and entire inferior wall are hypokinetic, Rvnl. Mild MR and TR. Cath showed critical left main coronary stenosis (mLM to dLM 95%, ost LAD to pLAD 100%, ostCx to pCx 100%), patent LIMA to LAD, patent SVG to OM, severely elevated LVEDP 42, PCWP 33, PAP 65/33/47, RA 23, CO/CI 4.39/2.33. She was diuresed with significant improvement in her symptoms and discharged.  Echo 12/24: EF 35-40%, LV with RWMA, GIDD. RV mod reduced.   Today she returns for AHF follow up. Overall feeling ***. Denies palpitations, CP,  dizziness, edema, or PND/Orthopnea. *** SOB. Appetite ok. No fever or chills. Weight at home *** pounds. Taking all medications. Denies ETOH, tobacco or drug use.   PMH, current medications, allergies, social history, and family history reviewed in epic.    There were no vitals filed for this visit. PHYSICAL EXAM: General:  *** appearing.  No respiratory difficulty HEENT: normal Neck: supple. JVD *** cm.  Cor: PMI nondisplaced. Regular rate & rhythm. No rubs, gallops or murmurs. Lungs: clear Abdomen: soft, nontender, nondistended. Good bowel sounds. Extremities: no cyanosis, clubbing, rash, edema  Neuro: alert & oriented x 3. Moves all 4 extremities w/o difficulty. Affect pleasant.   Wt Readings from Last 3 Encounters:  06/26/23 85.5 kg (188 lb 6.4 oz)  06/13/23 85.5 kg (188 lb 6.4 oz)  05/06/23 86.7 kg (191 lb 3.2 oz)     DATA REVIEW ECHO: 11/24 LVEF 20 to 25%, severely reduced RV function, no aortic stenosis CATH: 03/06/23: Critical left main coronary stenosis Patent LIMA to the LAD, no significant subclavian gradient by pullback. Patent SVG to OM. Ramus intermediate was not grafted. RCA is without significant disease. Severely elevated LV filling pressures. LVEDP 42 mm Hg. PCWP 36/43 mean 33 mm Hg. Severe elevated pulmonary pressures. PAP 65/33 mean 47 mm Hg. RA pressure elevated with mean 23 mm Hg. Cardiac output 4.39 L/min, index 2.33   Heart failure review: - Classification: Heart failure with reduced EF - Etiology: Ischemic -  NYHA Class: II-IIIa - Volume status: Euvolemic - ACEi/ARB/ARNI: Currently up-titrating - Aldosterone antagonist: Currently up-titrating - Beta-blocker: Currently up-titrating - Digoxin: Not indicated - Hydralazine /Nitrates: Not indicated - SGLT2i: Intolerant - GLP-1: Consider in future - Advanced therapies: Not needed at this time - ICD: Currently uptitrating GDMT  ASSESSMENT & PLAN: Chronic systolic heart failure: Ischemic cardiomyopathy,  previously recovered to 40-45% but 11/24 20-25%. Elevated pressures on admission but improved with aggressive diruesis and GDMT. SBP limits further titration at this time. - NYHA II-IIIa. Appears euvolemic - Echo 12/24: EF 35-40%, LV with RWMA, GIDD. RV mod reduced.  - Continue spironolactone  12.5 mg daily, losartan  12.5 mg daily - Consider beta-blocker at next appointment (BP too soft today for addition of BB or up titration of other meds) - Continue Lasix  40 mg daily - No SGLT2 with recurrent UTIs - Has been referred to cardiac rehab, scheduled appt but missed it. Encouraged her to call and reschedule. She will let us  know if we need to place referral in again.  - Plans to complete sleep study soon   CAD with NSTEMI: STEMI in 2017 s/p PCI to LAD, NSTEMI c/b cardiogenic shock s/p emergent CABG (LIMA to LAD, SVG to OM) in 2022. LHC: critical left main coronary stenosis (mLM to dLM 95%, ost LAD to pLAD 100%, ostCx to pCx 100%), patent LIMA to LAD, patent SVG to OM. No chest pain..  - Continue Asprin, Plavix  and Lipitor   DMII - Management per PCP  Follow up in 3-4 months with Dr. Alease Amend ***  Sheryl Donna AGACNP-BC  08/11/23

## 2023-08-12 ENCOUNTER — Encounter (HOSPITAL_COMMUNITY): Payer: Self-pay

## 2023-08-12 ENCOUNTER — Ambulatory Visit (HOSPITAL_COMMUNITY)
Admission: RE | Admit: 2023-08-12 | Discharge: 2023-08-12 | Disposition: A | Discharge status: Home / Self Care | Source: Ambulatory Visit | Attending: Internal Medicine | Admitting: Internal Medicine

## 2023-08-12 VITALS — BP 112/60 | HR 83 | Wt 186.6 lb

## 2023-08-12 DIAGNOSIS — Z955 Presence of coronary angioplasty implant and graft: Secondary | ICD-10-CM | POA: Diagnosis not present

## 2023-08-12 DIAGNOSIS — Z7982 Long term (current) use of aspirin: Secondary | ICD-10-CM | POA: Diagnosis not present

## 2023-08-12 DIAGNOSIS — Z79899 Other long term (current) drug therapy: Secondary | ICD-10-CM | POA: Insufficient documentation

## 2023-08-12 DIAGNOSIS — I255 Ischemic cardiomyopathy: Secondary | ICD-10-CM | POA: Insufficient documentation

## 2023-08-12 DIAGNOSIS — I2510 Atherosclerotic heart disease of native coronary artery without angina pectoris: Secondary | ICD-10-CM | POA: Insufficient documentation

## 2023-08-12 DIAGNOSIS — N1831 Chronic kidney disease, stage 3a: Secondary | ICD-10-CM | POA: Diagnosis not present

## 2023-08-12 DIAGNOSIS — Z7902 Long term (current) use of antithrombotics/antiplatelets: Secondary | ICD-10-CM | POA: Insufficient documentation

## 2023-08-12 DIAGNOSIS — E119 Type 2 diabetes mellitus without complications: Secondary | ICD-10-CM | POA: Diagnosis not present

## 2023-08-12 DIAGNOSIS — E1122 Type 2 diabetes mellitus with diabetic chronic kidney disease: Secondary | ICD-10-CM

## 2023-08-12 DIAGNOSIS — Z951 Presence of aortocoronary bypass graft: Secondary | ICD-10-CM | POA: Diagnosis not present

## 2023-08-12 DIAGNOSIS — I447 Left bundle-branch block, unspecified: Secondary | ICD-10-CM | POA: Insufficient documentation

## 2023-08-12 DIAGNOSIS — Z794 Long term (current) use of insulin: Secondary | ICD-10-CM

## 2023-08-12 DIAGNOSIS — I252 Old myocardial infarction: Secondary | ICD-10-CM | POA: Diagnosis not present

## 2023-08-12 DIAGNOSIS — G4733 Obstructive sleep apnea (adult) (pediatric): Secondary | ICD-10-CM | POA: Diagnosis not present

## 2023-08-12 DIAGNOSIS — I5022 Chronic systolic (congestive) heart failure: Secondary | ICD-10-CM | POA: Diagnosis not present

## 2023-08-12 LAB — BASIC METABOLIC PANEL WITH GFR
Anion gap: 8 (ref 5–15)
BUN: 28 mg/dL — ABNORMAL HIGH (ref 8–23)
CO2: 28 mmol/L (ref 22–32)
Calcium: 9.5 mg/dL (ref 8.9–10.3)
Chloride: 100 mmol/L (ref 98–111)
Creatinine, Ser: 0.91 mg/dL (ref 0.44–1.00)
GFR, Estimated: >60 mL/min (ref >60)
Glucose, Bld: 164 mg/dL — ABNORMAL HIGH (ref 70–99)
Potassium: 3.9 mmol/L (ref 3.5–5.1)
Sodium: 136 mmol/L (ref 135–145)

## 2023-08-12 LAB — BRAIN NATRIURETIC PEPTIDE: B Natriuretic Peptide: 245.3 pg/mL — ABNORMAL HIGH (ref 0.0–100.0)

## 2023-08-12 MED ORDER — ATORVASTATIN CALCIUM 80 MG PO TABS: 80.0000 mg | ORAL_TABLET | Freq: Every day | ORAL | 3 refills | Status: AC

## 2023-08-12 NOTE — Patient Instructions (Signed)
 Medication Changes:  STATIN MEDICATION REFILLED   Lab Work:  Labs done today, your results will be available in MyChart, we will contact you for abnormal readings.  Follow-Up in: 3-4 MONTHS WITH DR. Alease Amend PLEASE CALL OUR OFFICE AROUND LATE MAY  TO GET SCHEDULED FOR YOUR APPOINTMENT. PHONE NUMBER IS [phone] OPTION 2   At the [facility], you and your health needs are our priority. We have a designated team specialized in the treatment of Heart Failure. This Care Team includes your primary Heart Failure Specialized Cardiologist (physician), Advanced Practice Providers (APPs- Physician Assistants and Nurse Practitioners), and Pharmacist who all work together to provide you with the care you need, when you need it.   You may see any of the following providers on your designated Care Team at your next follow up:  Dr. Jules Oar Dr. Peder Bourdon Dr. Alwin Baars Dr. Judyth Nunnery Nieves Bars, NP Ruddy Corral, [place] [facility] [place], [place] Dennise Fitz, NP [place] Lee, NP Luster Salters, PharmD   Please be sure to bring in all your medications bottles to every appointment.   Need to Contact Us :  If you have any questions or concerns before your next appointment please send us  a message through [place] or call our office at [phone].    TO LEAVE A MESSAGE FOR THE NURSE SELECT OPTION 2, PLEASE LEAVE A MESSAGE INCLUDING: YOUR NAME DATE OF BIRTH CALL BACK NUMBER REASON FOR CALL**this is important as we prioritize the call backs  YOU WILL RECEIVE A CALL BACK THE SAME DAY AS LONG AS YOU CALL BEFORE 4:00 PM

## 2023-08-14 DIAGNOSIS — I10 Essential (primary) hypertension: Secondary | ICD-10-CM | POA: Diagnosis not present

## 2023-08-14 DIAGNOSIS — E119 Type 2 diabetes mellitus without complications: Secondary | ICD-10-CM | POA: Diagnosis not present

## 2023-08-14 DIAGNOSIS — F331 Major depressive disorder, recurrent, moderate: Secondary | ICD-10-CM | POA: Diagnosis not present

## 2023-08-15 DIAGNOSIS — Z1331 Encounter for screening for depression: Secondary | ICD-10-CM | POA: Diagnosis not present

## 2023-08-22 ENCOUNTER — Telehealth: Payer: Self-pay | Admitting: *Deleted

## 2023-08-22 NOTE — Telephone Encounter (Signed)
   Patient Name: WALESKA RAILING  DOB: 03-Feb-1956 MRN: [id number]  Primary Cardiologist: Knox Perl, MD  Chart reviewed as part of pre-operative protocol coverage. Cataract extractions are recognized in guidelines as low risk surgeries that do not typically require specific preoperative testing or holding of blood thinner therapy. Therefore, given past medical history and time since last visit, based on ACC/AHA guidelines, JYANA CLINESMITH would be at acceptable risk for the planned procedure without further cardiovascular testing.   I will route this recommendation to the requesting party via Epic fax function and remove from pre-op pool.  Please call with questions.  Morey Ar, NP 08/22/2023, 11:42 AM

## 2023-08-22 NOTE — Telephone Encounter (Signed)
   Pre-operative Risk Assessment    Patient Name: Sonia Morales  DOB: 08/22/1955 MRN: [id number]   Date of last office visit: 08/12/23 Date of next office visit: N/A   Request for Surgical Clearance    Procedure:  CATARACT EXTRACTION BY PHACOEMULSIFICATION WITH INTRAOCULAR LENS IMPLANT, LEFT EYE THEN RIGHT EYE   Date of Surgery:  Clearance 08/30/23    & 09/13/23                            Surgeon:   Kelby Patches Group or Practice Name:  [facility] Phone number:  [phone] XT: [phone] Fax number:  [phone]   Type of Clearance Requested:   - Medical    Type of Anesthesia:  IV POCEDURAL SEDATION AND TOPICAL ANESTHESIA   Additional requests/questions:    Berenda Breaker   08/22/2023, 7:08 AM

## 2023-08-30 DIAGNOSIS — H25812 Combined forms of age-related cataract, left eye: Secondary | ICD-10-CM | POA: Diagnosis not present

## 2023-08-30 DIAGNOSIS — G4733 Obstructive sleep apnea (adult) (pediatric): Secondary | ICD-10-CM | POA: Diagnosis not present

## 2023-08-30 DIAGNOSIS — E1136 Type 2 diabetes mellitus with diabetic cataract: Secondary | ICD-10-CM | POA: Diagnosis not present

## 2023-09-04 DIAGNOSIS — G4700 Insomnia, unspecified: Secondary | ICD-10-CM | POA: Diagnosis not present

## 2023-09-04 DIAGNOSIS — G894 Chronic pain syndrome: Secondary | ICD-10-CM | POA: Diagnosis not present

## 2023-09-04 DIAGNOSIS — M47816 Spondylosis without myelopathy or radiculopathy, lumbar region: Secondary | ICD-10-CM | POA: Diagnosis not present

## 2023-09-04 DIAGNOSIS — Z79891 Long term (current) use of opiate analgesic: Secondary | ICD-10-CM | POA: Diagnosis not present

## 2023-09-04 DIAGNOSIS — M47812 Spondylosis without myelopathy or radiculopathy, cervical region: Secondary | ICD-10-CM | POA: Diagnosis not present

## 2023-09-13 DIAGNOSIS — F331 Major depressive disorder, recurrent, moderate: Secondary | ICD-10-CM | POA: Diagnosis not present

## 2023-09-13 DIAGNOSIS — G4733 Obstructive sleep apnea (adult) (pediatric): Secondary | ICD-10-CM | POA: Diagnosis not present

## 2023-09-13 DIAGNOSIS — E1136 Type 2 diabetes mellitus with diabetic cataract: Secondary | ICD-10-CM | POA: Diagnosis not present

## 2023-09-13 DIAGNOSIS — H25811 Combined forms of age-related cataract, right eye: Secondary | ICD-10-CM | POA: Diagnosis not present

## 2023-09-13 DIAGNOSIS — I10 Essential (primary) hypertension: Secondary | ICD-10-CM | POA: Diagnosis not present

## 2023-09-13 DIAGNOSIS — E119 Type 2 diabetes mellitus without complications: Secondary | ICD-10-CM | POA: Diagnosis not present

## 2023-09-21 DIAGNOSIS — H04123 Dry eye syndrome of bilateral lacrimal glands: Secondary | ICD-10-CM | POA: Diagnosis not present

## 2023-09-25 IMAGING — DX DG CHEST 1V PORT
1 series · 1 of 1 positions shown · non-contrast
Comparison: 01/07/2021

CLINICAL DATA: Evaluate for pneumothorax.

EXAM:
PORTABLE CHEST 1 VIEW

[chest ap]
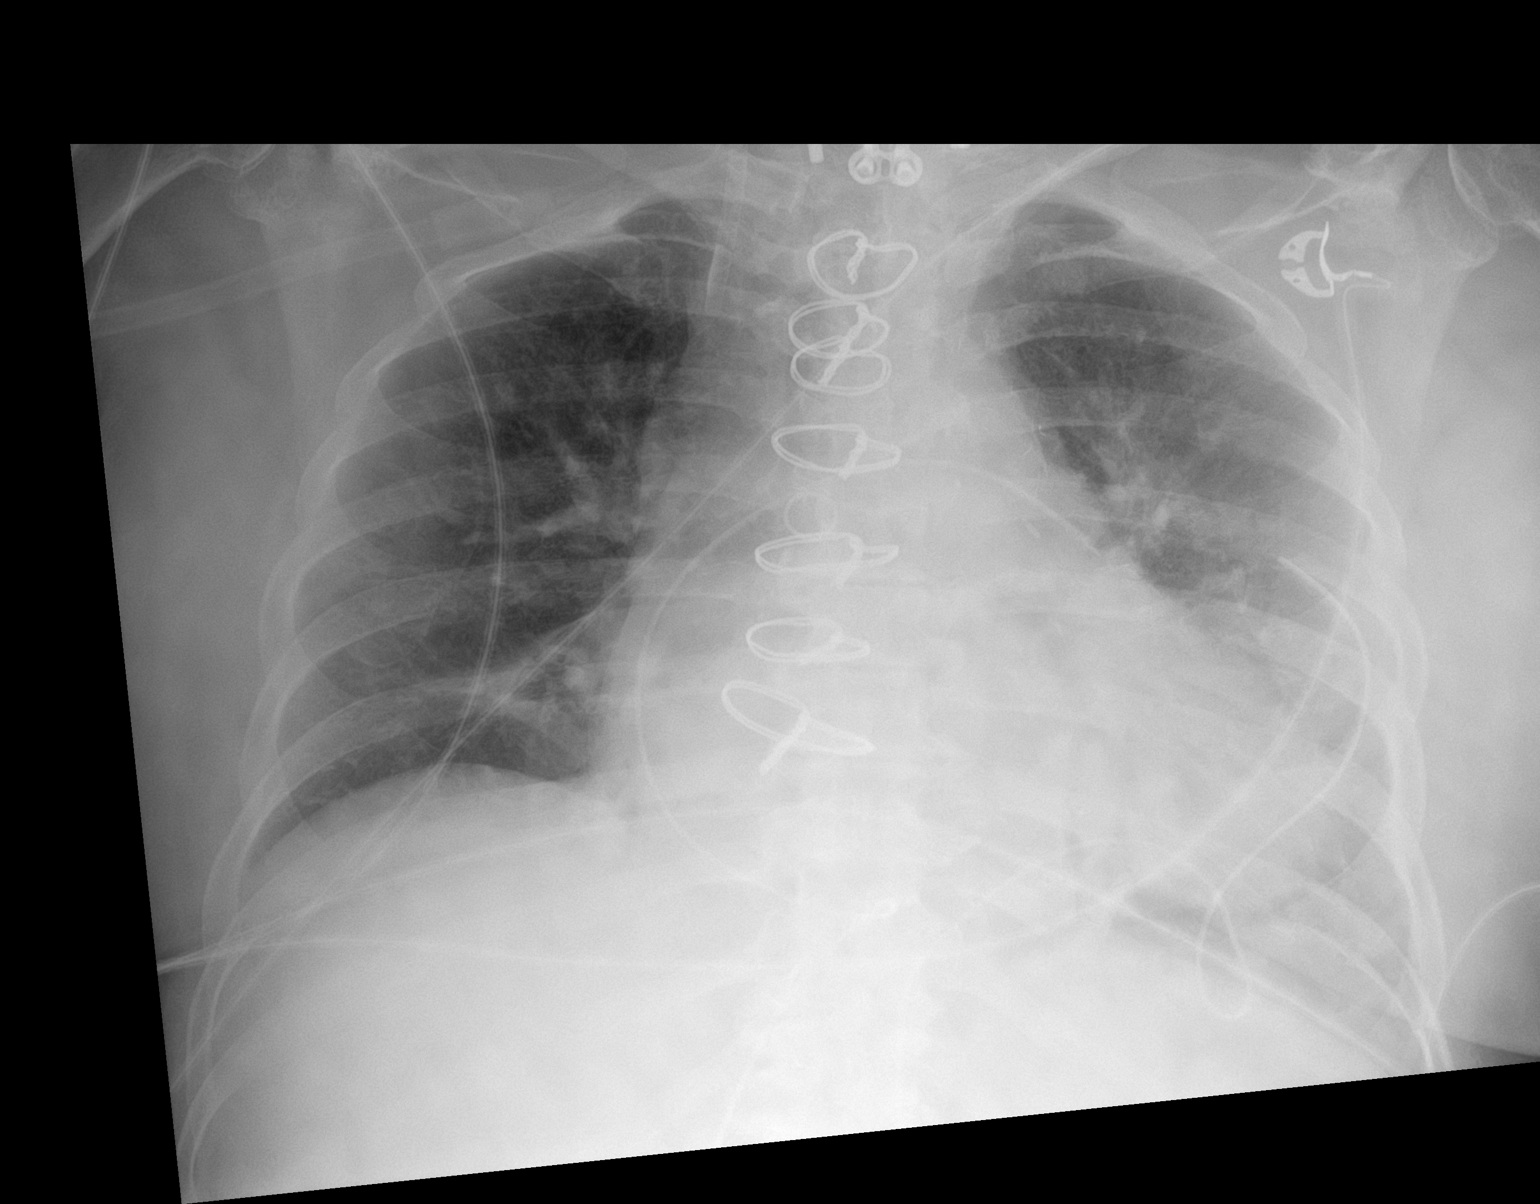

[1 of 1 positions shown; findings below may reference images not displayed]

FINDINGS: Status post median sternotomy and CABG procedure. Right IJ catheter
tip is in the projection of the SVC. There has been removal of the
pulmonary arterial catheter. Mediastinal drain and left chest tube
are again seen. No pneumothorax visualized. Lung volumes are low.
Interval improvement in bilateral areas of lower lung zone
atelectasis.
IMPRESSION: 1. Interval improvement in bilateral lower lung zone atelectasis.
2. No pneumothorax visualized.

## 2023-09-26 IMAGING — DX DG CHEST 1V PORT
1 series · 1 of 1 positions shown · non-contrast
Comparison: Portable chest 01/08/2021 and earlier.

CLINICAL DATA: 65-year-old female postoperative day 3 status post
CABG.

EXAM:
PORTABLE CHEST 1 VIEW

[chest ap]
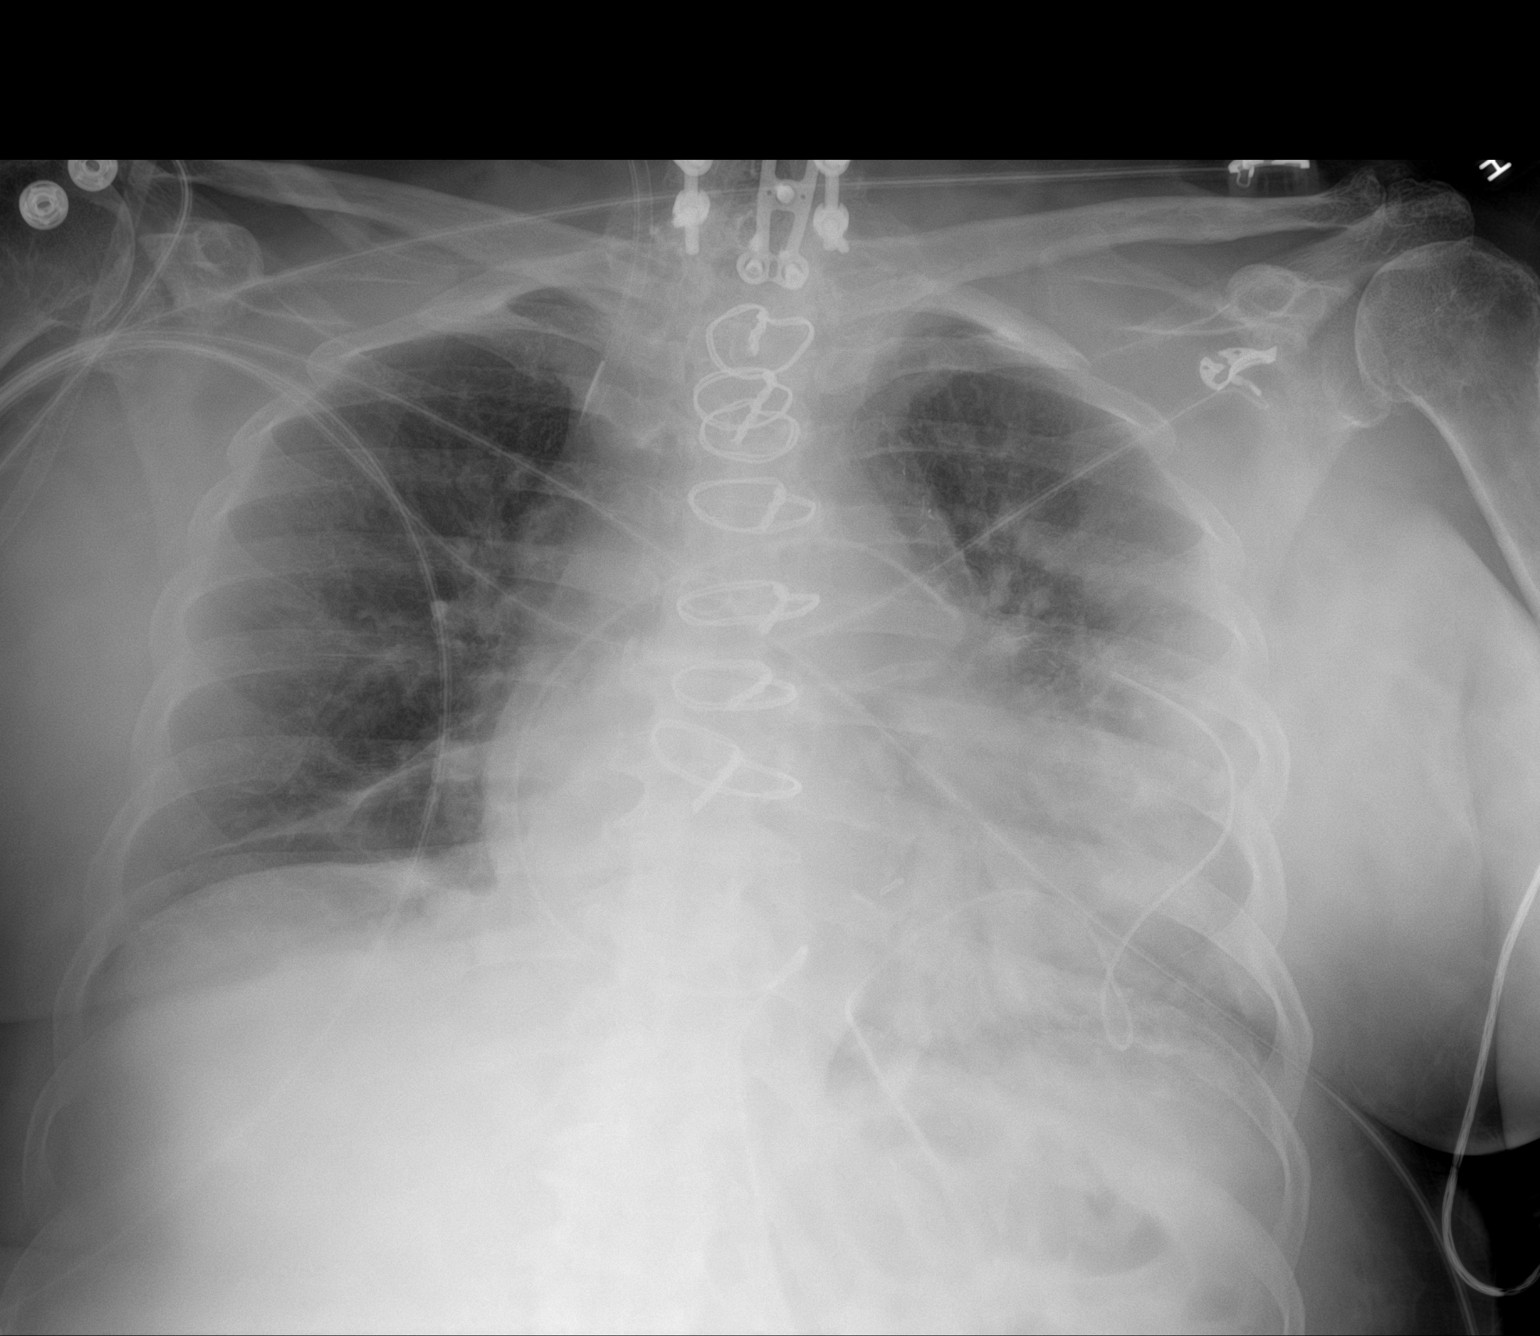

[1 of 1 positions shown; findings below may reference images not displayed]

FINDINGS: Portable AP semi upright view at 0906 hours. Right IJ introducer
sheath, inferior approach Swan-Ganz catheter, and left chest tube
appears stable. Stable lung volumes and mediastinal contours. No
pneumothorax identified. Patchy and confluent left perihilar opacity
persists, not significantly changed. Stable ventilation elsewhere.
Small if any pleural effusions. No overt edema. Negative visible
bowel gas pattern. Stable visualized osseous structures. Prior
anterior and posterior cervical fusion.
IMPRESSION: 1. Stable lines and tubes. No pneumothorax.
2. Ongoing asymmetric and confluent left perihilar opacity, greater
than that typical of atelectasis. Asymmetric pulmonary edema might
be possible, but aspiration or pneumonia not excluded.

## 2023-09-27 IMAGING — DX DG CHEST 1V PORT
1 series · 1 of 1 positions shown · non-contrast
Comparison: 01/09/2021

CLINICAL DATA: Left chest tube, pneumothorax

EXAM:
PORTABLE CHEST 1 VIEW

[chest]
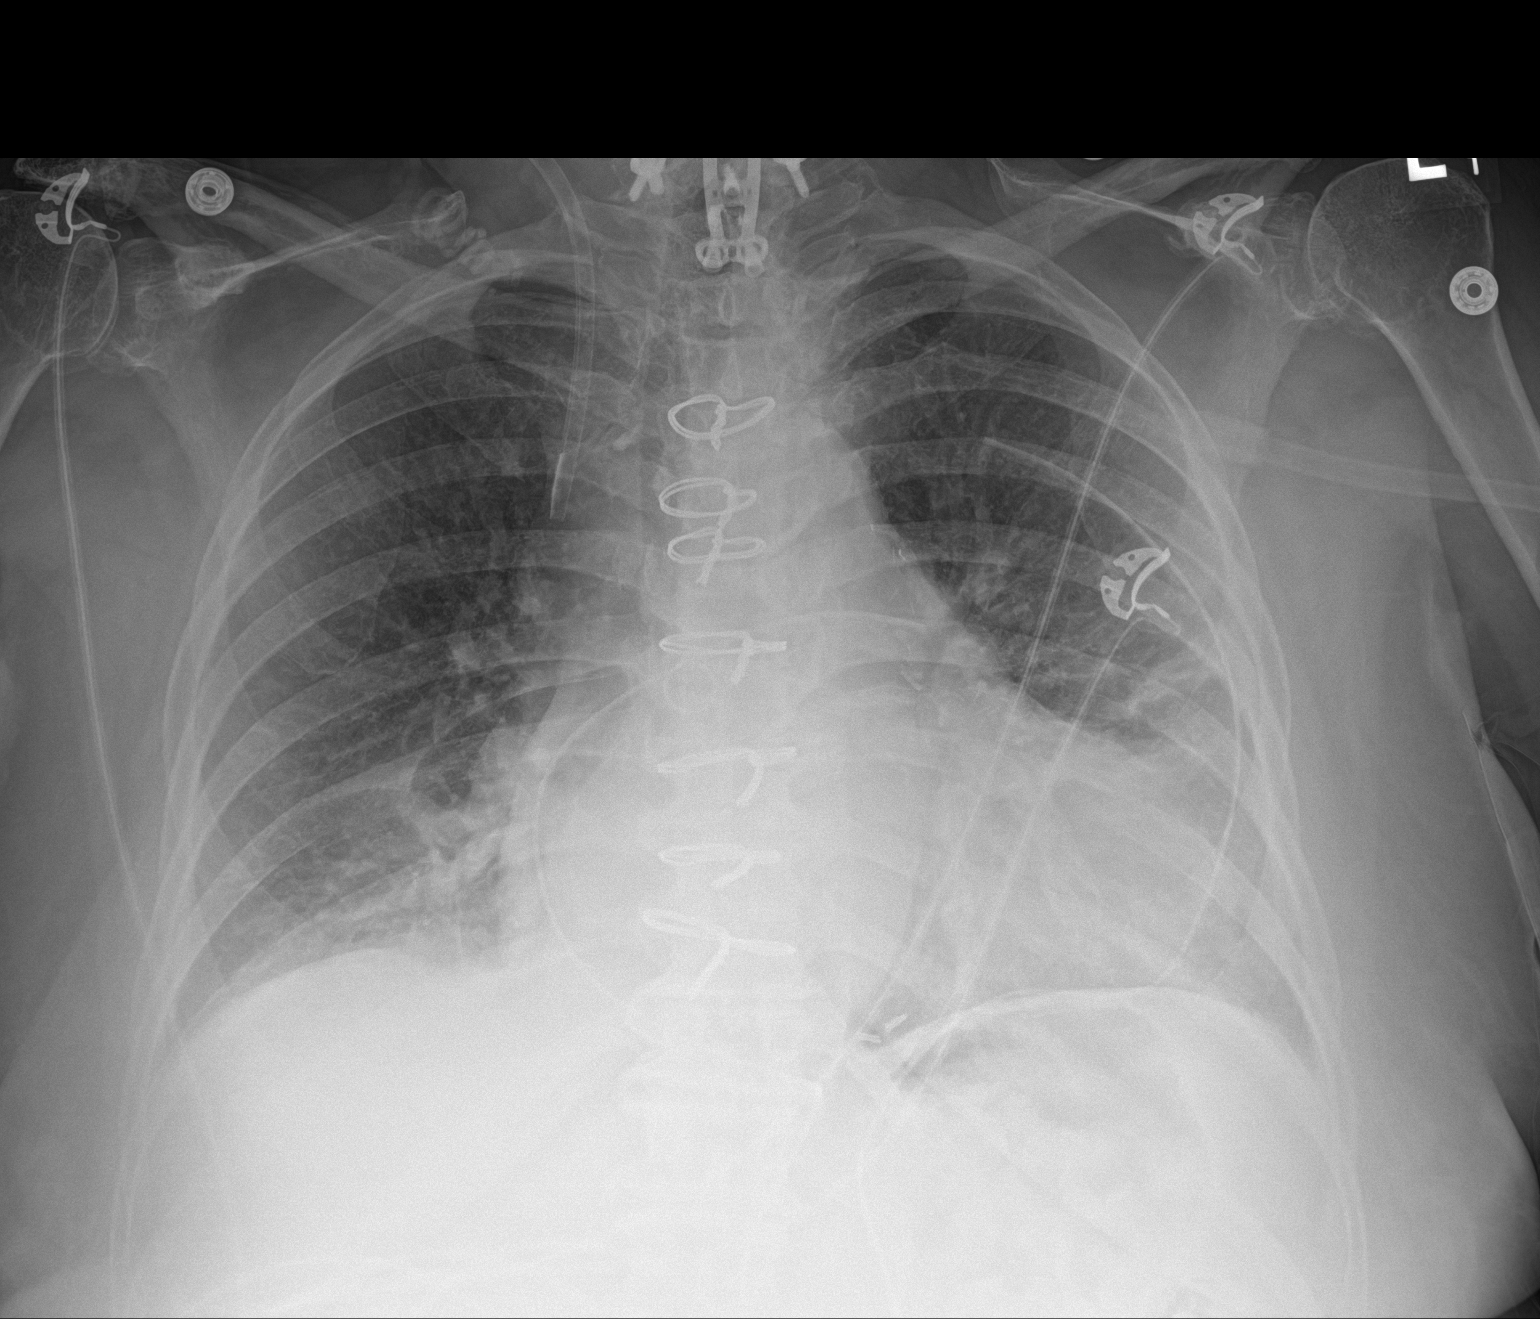

[1 of 1 positions shown; findings below may reference images not displayed]

FINDINGS: Left chest tube remains in place. No pneumothorax. Right central
line and mediastinal drain unchanged. Improving aeration. Continued
mid and lower lung atelectasis. No visible effusions.
IMPRESSION: Left chest tube remains in place without visible pneumothorax.

Somewhat improved aeration with continued areas of atelectasis.

## 2023-09-28 IMAGING — DX DG CHEST 2V
2 series · 2 of 2 positions shown · non-contrast
Comparison: 01/10/2021

CLINICAL DATA: Chest tube removal

EXAM:
CHEST - 2 VIEW

[chest lat]
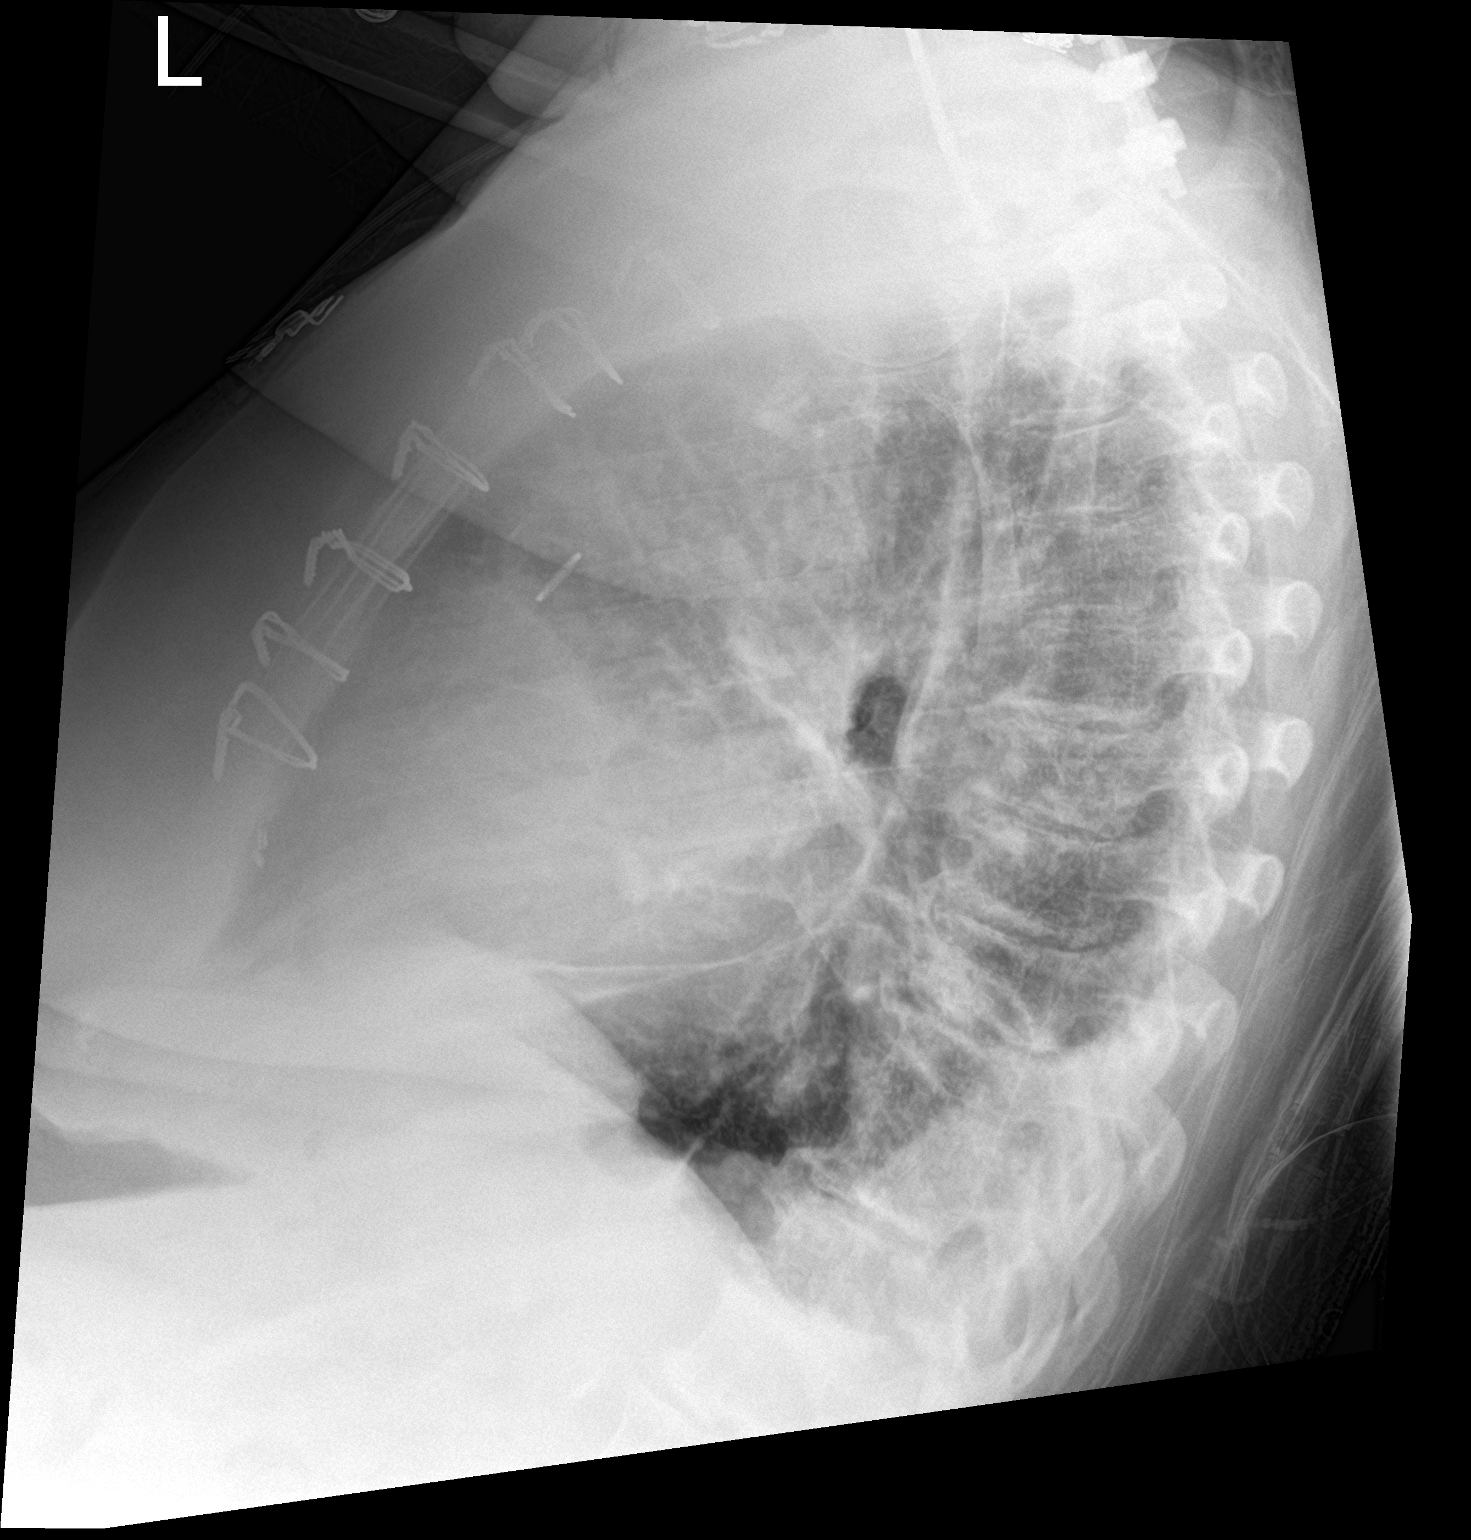

[chest ap]
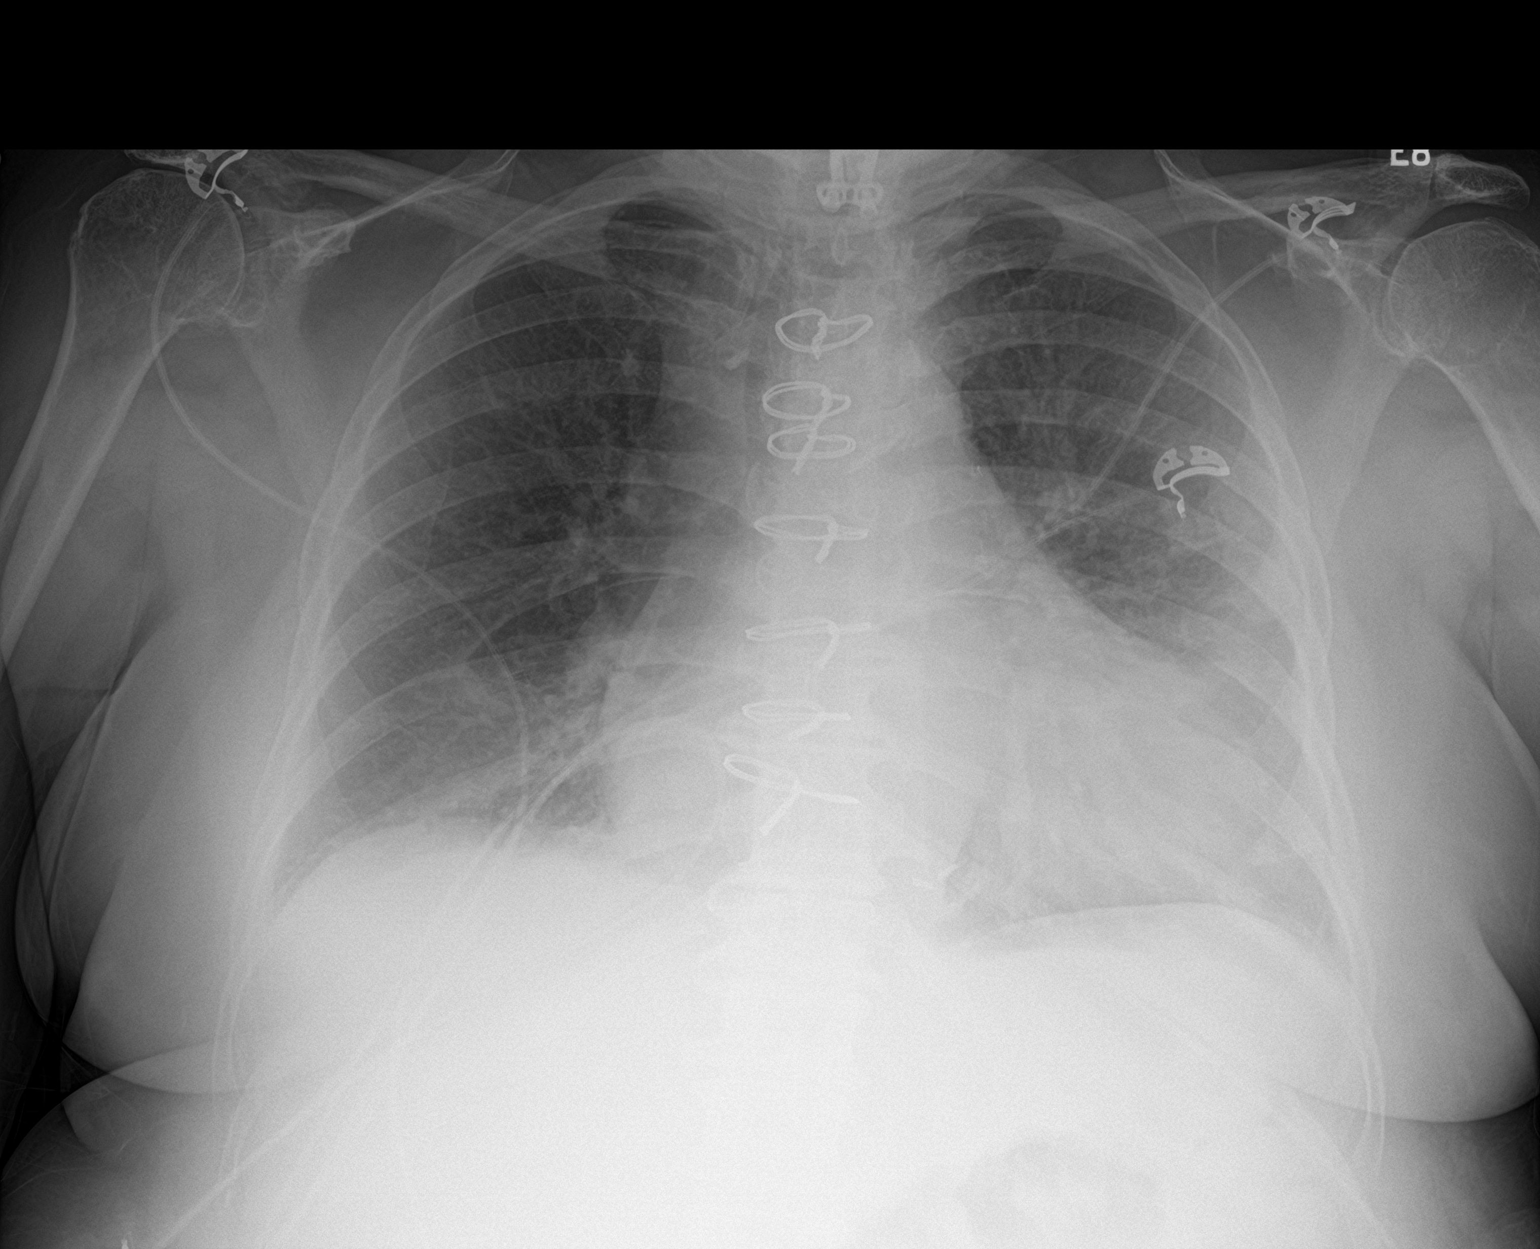

[2 of 2 positions shown; findings below may reference images not displayed]

FINDINGS: Interval removal of left chest tube and mediastinal drain. No
pneumothorax. Changes of CABG. Bibasilar opacities, likely
atelectasis, similar to prior study. No effusions.
IMPRESSION: Interval removal of left chest tube without pneumothorax.

Stable bibasilar atelectasis.

## 2023-10-07 DIAGNOSIS — G894 Chronic pain syndrome: Secondary | ICD-10-CM | POA: Diagnosis not present

## 2023-10-07 DIAGNOSIS — M47816 Spondylosis without myelopathy or radiculopathy, lumbar region: Secondary | ICD-10-CM | POA: Diagnosis not present

## 2023-10-07 DIAGNOSIS — M47812 Spondylosis without myelopathy or radiculopathy, cervical region: Secondary | ICD-10-CM | POA: Diagnosis not present

## 2023-10-07 DIAGNOSIS — G4700 Insomnia, unspecified: Secondary | ICD-10-CM | POA: Diagnosis not present

## 2023-10-14 DIAGNOSIS — E119 Type 2 diabetes mellitus without complications: Secondary | ICD-10-CM | POA: Diagnosis not present

## 2023-10-14 DIAGNOSIS — F331 Major depressive disorder, recurrent, moderate: Secondary | ICD-10-CM | POA: Diagnosis not present

## 2023-10-14 DIAGNOSIS — I10 Essential (primary) hypertension: Secondary | ICD-10-CM | POA: Diagnosis not present

## 2023-10-15 ENCOUNTER — Ambulatory Visit (HOSPITAL_BASED_OUTPATIENT_CLINIC_OR_DEPARTMENT_OTHER): Admitting: Cardiology

## 2023-11-05 DIAGNOSIS — G4700 Insomnia, unspecified: Secondary | ICD-10-CM | POA: Diagnosis not present

## 2023-11-05 DIAGNOSIS — M47816 Spondylosis without myelopathy or radiculopathy, lumbar region: Secondary | ICD-10-CM | POA: Diagnosis not present

## 2023-11-05 DIAGNOSIS — M47812 Spondylosis without myelopathy or radiculopathy, cervical region: Secondary | ICD-10-CM | POA: Diagnosis not present

## 2023-11-05 DIAGNOSIS — G894 Chronic pain syndrome: Secondary | ICD-10-CM | POA: Diagnosis not present

## 2023-11-12 ENCOUNTER — Encounter (HOSPITAL_COMMUNITY): Admitting: Cardiology

## 2023-11-12 NOTE — Progress Notes (Incomplete)
 ADVANCED HEART FAILURE FOLLOW UP CLINIC NOTE  Referring Physician: Toribio Jerel MATSU, MD  Primary Care: Toribio Jerel MATSU, MD Primary Cardiologist:  HPI: Sonia Morales is a 68 y.o. female with a PMH of HFrEF, CAD manifested by STEMI in 2017 s/p PCI to LAD, NSTEMI c/b cardiogenic shock s/p emergent CABG (LIMA to LAD, SVG to OM) in 2022, and type 2 diabetes who presents for follow up of heart failure..      3/23 Echo: 40-45% (improved from 35% pre-CABG in 9/22) GIDD, diffuse LV regional wall motion hypokinesis   Last seen in Cardiology clinic 12/03/22 for 6 month follow up. At the time she had been doing well without recurrence of angina.      Echo with EF 20-25%, entire septum and apex are akinetic,anterior wall, entire lateral wall, and entire inferior wall are hypokinetic, Rvnl. Mild MR and TR .   Cath showed critical left main coronary stenosis (mLM to dLM 95%, ost LAD to pLAD 100%, ostCx to pCx 100%), patent LIMA to LAD, patent SVG to OM, severely elevated LVEDP 42, PCWP 33, PAP 65/33/47, RA 23, CO/CI 4.39/2.33. She was diuresed with significant improvement in her symptoms and discharged.     SUBJECTIVE:  Overall doing well since discharge, still feeling a bit weak and unsteady but no worsening chest pain or shortness of breath.  Her weight has slightly increased but denies any worsening edema or swelling.  Has been taking her Lasix  daily.  PMH, current medications, allergies, social history, and family history reviewed in epic.  PHYSICAL EXAM: There were no vitals filed for this visit.  GENERAL: Well nourished and in no apparent distress at rest.  HEENT: The mucous membranes are pink and moist.   PULM:  Normal work of breathing, clear to auscultation bilaterally. Respirations are unlabored.  CARDIAC:  JVP: Not elevated         Normal rate with regular rhythm. No murmurs, rubs or gallops.  No lower extremity edema.  ABDOMEN: Soft, non-tender, non-distended. NEUROLOGIC: Patient  is oriented x3 with no focal or lateralizing neurologic deficits.  PSYCH: Patients affect is appropriate, there is no evidence of anxiety or depression.  SKIN: Warm and dry; no lesions or wounds. Warm and well perfused extremities.  DATA REVIEW    ECHO: 11/24 LVEF 20 to 25%, severely reduced RV function, no aortic stenosis  CATH: 03/06/23: Critical left main coronary stenosis Patent LIMA to the LAD, no significant subclavian gradient by pullback. Patent SVG to OM.  Ramus intermediate was not grafted. RCA is without significant disease Severely elevated LV filling pressures. LVEDP 42 mm Hg. PCWP 36/43 mean 33 mm Hg.  Severe elevated pulmonary pressures. PAP 65/33 mean 47 mm Hg. RA pressure elevated with mean 23 mm Hg Cardiac output 4.39 L/min, index 2.33   Heart failure review: - Classification: Heart failure with reduced EF - Etiology: Ischemic - NYHA Class: IIb - Volume status: Euvolemic - ACEi/ARB/ARNI: Currently up-titrating - Aldosterone antagonist: Currently up-titrating - Beta-blocker: Currently up-titrating - Digoxin: Not indicated - Hydralazine /Nitrates: Not indicated - SGLT2i: Intolerant - GLP-1: Consider in future - Advanced therapies: Not needed at this time - ICD: Currently uptitrating GDMT  ASSESSMENT & PLAN:  Chronic systolic heart failure: Ischemic cardiomyopathy, previously recovered to 40-45% but now 20-25%. Elevated pressures on admission but improved with aggressive diruesis and GDMT. SBP limits further titration at this time. Good cardiac rehab candidate. - Continue spironolactone  12.5 mg daily, losartan  12.5 mg daily -Consider beta-blocker at next appointment -  Lasix  40 mg daily -No SGLT2 with recurrent UTIs -Cardiac rehab -Echocardiogram at next appointment   CAD with NSTEMI: STEMI in 2017 s/p PCI to LAD, NSTEMI c/b cardiogenic shock s/p emergent CABG (LIMA to LAD, SVG to OM) in 2022. LHC: critical left main coronary stenosis (mLM to dLM 95%, ost LAD  to pLAD 100%, ostCx to pCx 100%), patent LIMA to LAD, patent SVG to OM. No chest pain..  - Continue Asprin - Continue Plavix   - Continue Lipitor   DMII - Management per PCP    [name], MD Advanced Heart Failure Mechanical Circulatory Support 11/12/23

## 2023-11-14 DIAGNOSIS — I10 Essential (primary) hypertension: Secondary | ICD-10-CM | POA: Diagnosis not present

## 2023-11-14 DIAGNOSIS — F331 Major depressive disorder, recurrent, moderate: Secondary | ICD-10-CM | POA: Diagnosis not present

## 2023-11-14 DIAGNOSIS — E119 Type 2 diabetes mellitus without complications: Secondary | ICD-10-CM | POA: Diagnosis not present

## 2023-11-15 DIAGNOSIS — I10 Essential (primary) hypertension: Secondary | ICD-10-CM | POA: Diagnosis not present

## 2023-11-15 DIAGNOSIS — Z0001 Encounter for general adult medical examination with abnormal findings: Secondary | ICD-10-CM | POA: Diagnosis not present

## 2023-11-15 DIAGNOSIS — E119 Type 2 diabetes mellitus without complications: Secondary | ICD-10-CM | POA: Diagnosis not present

## 2023-11-15 DIAGNOSIS — E7849 Other hyperlipidemia: Secondary | ICD-10-CM | POA: Diagnosis not present

## 2023-11-15 DIAGNOSIS — E1165 Type 2 diabetes mellitus with hyperglycemia: Secondary | ICD-10-CM | POA: Diagnosis not present

## 2023-11-15 DIAGNOSIS — Z1329 Encounter for screening for other suspected endocrine disorder: Secondary | ICD-10-CM | POA: Diagnosis not present

## 2023-11-15 DIAGNOSIS — E1142 Type 2 diabetes mellitus with diabetic polyneuropathy: Secondary | ICD-10-CM | POA: Diagnosis not present

## 2023-11-25 DIAGNOSIS — I5020 Unspecified systolic (congestive) heart failure: Secondary | ICD-10-CM | POA: Diagnosis not present

## 2023-11-25 DIAGNOSIS — Z6834 Body mass index (BMI) 34.0-34.9, adult: Secondary | ICD-10-CM | POA: Diagnosis not present

## 2023-11-25 DIAGNOSIS — E1142 Type 2 diabetes mellitus with diabetic polyneuropathy: Secondary | ICD-10-CM | POA: Diagnosis not present

## 2023-11-25 DIAGNOSIS — F331 Major depressive disorder, recurrent, moderate: Secondary | ICD-10-CM | POA: Diagnosis not present

## 2023-11-25 DIAGNOSIS — I10 Essential (primary) hypertension: Secondary | ICD-10-CM | POA: Diagnosis not present

## 2023-11-28 ENCOUNTER — Ambulatory Visit (HOSPITAL_BASED_OUTPATIENT_CLINIC_OR_DEPARTMENT_OTHER): Admitting: Cardiology

## 2023-12-01 ENCOUNTER — Other Ambulatory Visit (HOSPITAL_COMMUNITY): Payer: Self-pay | Admitting: Cardiology

## 2023-12-01 DIAGNOSIS — I5043 Acute on chronic combined systolic (congestive) and diastolic (congestive) heart failure: Secondary | ICD-10-CM

## 2023-12-03 ENCOUNTER — Ambulatory Visit: Payer: Medicare Other | Admitting: Cardiology

## 2023-12-03 DIAGNOSIS — M47812 Spondylosis without myelopathy or radiculopathy, cervical region: Secondary | ICD-10-CM | POA: Diagnosis not present

## 2023-12-03 DIAGNOSIS — M7061 Trochanteric bursitis, right hip: Secondary | ICD-10-CM | POA: Diagnosis not present

## 2023-12-03 DIAGNOSIS — M1712 Unilateral primary osteoarthritis, left knee: Secondary | ICD-10-CM | POA: Diagnosis not present

## 2023-12-03 DIAGNOSIS — G4700 Insomnia, unspecified: Secondary | ICD-10-CM | POA: Diagnosis not present

## 2023-12-03 DIAGNOSIS — M47816 Spondylosis without myelopathy or radiculopathy, lumbar region: Secondary | ICD-10-CM | POA: Diagnosis not present

## 2023-12-03 DIAGNOSIS — G894 Chronic pain syndrome: Secondary | ICD-10-CM | POA: Diagnosis not present

## 2023-12-05 ENCOUNTER — Encounter (HOSPITAL_COMMUNITY): Admitting: Cardiology

## 2023-12-06 ENCOUNTER — Encounter (HOSPITAL_COMMUNITY): Payer: Self-pay | Admitting: Cardiology

## 2023-12-06 ENCOUNTER — Ambulatory Visit (HOSPITAL_COMMUNITY)
Admission: RE | Admit: 2023-12-06 | Discharge: 2023-12-06 | Disposition: A | Discharge status: Home / Self Care | Source: Ambulatory Visit | Attending: Cardiology | Admitting: Cardiology

## 2023-12-06 VITALS — BP 110/60 | HR 81 | Ht 63.0 in | Wt 186.4 lb

## 2023-12-06 DIAGNOSIS — Z951 Presence of aortocoronary bypass graft: Secondary | ICD-10-CM | POA: Diagnosis not present

## 2023-12-06 DIAGNOSIS — Z955 Presence of coronary angioplasty implant and graft: Secondary | ICD-10-CM | POA: Insufficient documentation

## 2023-12-06 DIAGNOSIS — R531 Weakness: Secondary | ICD-10-CM | POA: Diagnosis not present

## 2023-12-06 DIAGNOSIS — Z7902 Long term (current) use of antithrombotics/antiplatelets: Secondary | ICD-10-CM | POA: Diagnosis not present

## 2023-12-06 DIAGNOSIS — E119 Type 2 diabetes mellitus without complications: Secondary | ICD-10-CM | POA: Insufficient documentation

## 2023-12-06 DIAGNOSIS — I5043 Acute on chronic combined systolic (congestive) and diastolic (congestive) heart failure: Secondary | ICD-10-CM

## 2023-12-06 DIAGNOSIS — I2510 Atherosclerotic heart disease of native coronary artery without angina pectoris: Secondary | ICD-10-CM | POA: Insufficient documentation

## 2023-12-06 DIAGNOSIS — I252 Old myocardial infarction: Secondary | ICD-10-CM | POA: Diagnosis not present

## 2023-12-06 DIAGNOSIS — Z79899 Other long term (current) drug therapy: Secondary | ICD-10-CM | POA: Insufficient documentation

## 2023-12-06 DIAGNOSIS — Z7982 Long term (current) use of aspirin: Secondary | ICD-10-CM | POA: Diagnosis not present

## 2023-12-06 DIAGNOSIS — Z87891 Personal history of nicotine dependence: Secondary | ICD-10-CM | POA: Insufficient documentation

## 2023-12-06 DIAGNOSIS — R0602 Shortness of breath: Secondary | ICD-10-CM | POA: Diagnosis not present

## 2023-12-06 DIAGNOSIS — I5022 Chronic systolic (congestive) heart failure: Secondary | ICD-10-CM | POA: Insufficient documentation

## 2023-12-06 DIAGNOSIS — N1831 Chronic kidney disease, stage 3a: Secondary | ICD-10-CM

## 2023-12-06 MED ORDER — FUROSEMIDE 40 MG PO TABS: 40.0000 mg | ORAL_TABLET | ORAL | 3 refills | Status: DC

## 2023-12-06 MED ORDER — DIGOXIN 125 MCG PO TABS
0.1250 mg | ORAL_TABLET | Freq: Every day | ORAL | 3 refills | Status: AC
Start: 2023-12-06 — End: ?

## 2023-12-06 NOTE — Patient Instructions (Addendum)
 CHANGE Lasix  to every other day   START Digoxin  125 mcg daily.  Your physician has requested that you have an echocardiogram. Echocardiography is a painless test that uses sound waves to create images of your heart. It provides your doctor with information about the size and shape of your heart and how well your heart's chambers and valves are working. This procedure takes approximately one hour. There are no restrictions for this procedure. Please do NOT wear cologne, perfume, aftershave, or lotions (deodorant is allowed). Please arrive 15 minutes prior to your appointment time.  Please note: We ask at that you not bring children with you during ultrasound (echo/ vascular) testing. Due to room size and safety concerns, children are not allowed in the ultrasound rooms during exams. Our front office staff cannot provide observation of children in our lobby area while testing is being conducted. An adult accompanying a patient to their appointment will only be allowed in the ultrasound room at the discretion of the ultrasound technician under special circumstances. We apologize for any inconvenience.  You are scheduled for a Cardiopulmonary Exercise (CPX) Test as [facility] on: Date:   12/17/23   Time: 11.00am   Expect to be in the lab for 2 hours. Please plan to arrive 30 minutes prior to your appointment. You may be asked to reschedule your test if you arrive 20 minutes or more after your scheduled appointment time.  Main Campus address: [address] [place], [place] [address] You may arrive to the Main Entrance A or Entrance C (free valet parking is available at both). -Main Entrance A (on [address]) :proceed to admitting for check in -Entrance C (on [facility]): proceed to [employer] parking or under hospital deck parking using this code __[id number]_______  Check In: [facility] waiting room (1st floor)   General Instructions for the day of the test (Please follow all  instructions from your physician): Refrain from ingesting a heavy meal, alcohol, or caffeine or using tobacco products within 2 hours of the test (DO NOT FAST for mare than 8 hours). You may have all other non-alcoholic, non -caffeinated beverage,a light snack (crackers,a piece of fruit, carrot sticks, toast bagel,etc) up to your appointment. Avoid significant exertion or exercise within 24 hours of your test. Be prepared to exercise and sweat. Your clothing should permit freedom of movement and include walking or running shoes. Women bring loose fitting short sleeved blouse.  This evaluation may be fatiguing and you may wish ti have someone accompany you to the assessment to drive you home afterward. Bring a list of your medications with you, including dosage and frequency you take the medications (  I.e.,once per day, twice per day, etc). Take all medications as prescribed, unless noted below or instructed to do so by your physician.  Please do not take the following medications prior to your CPX:  _________________________________________________  _________________________________________________  Brief description of the test: A brief lung test will be performed. This will involve you taking deep breaths and blowing hard and fast through your mouth. During these , a clip will be on your nose and you will be breathing through a breathing device.   For the exercise portion of the test you will be walking on a treadmill, or riding a stationary bike, to your maximal effor or until symptoms such as chest pain, shortness of breath, leg pain or dizziness limit your exercise. You will be breathing in and out of a breathing device through  your mouth (a clip will be on your nose again). Your heart rate, ECG, blood pressure, oxygen  saturations, breathing rate and depth, amount of oxygen  you consume and amount of carbon dioxide you produce will be measured and monitored throughout the exercise test.  If you  need to cancel or reschedule your appointment please call [phone] If you have further questions please call your physician or Damien Nunnery at [phone]  Your physician recommends that you schedule a follow-up appointment as scheduled.  If you have any questions or concerns before your next appointment please send us  a message through [place] or call our office at [phone].    TO LEAVE A MESSAGE FOR THE NURSE SELECT OPTION 2, PLEASE LEAVE A MESSAGE INCLUDING: YOUR NAME DATE OF BIRTH CALL BACK NUMBER REASON FOR CALL**this is important as we prioritize the call backs  YOU WILL RECEIVE A CALL BACK THE SAME DAY AS LONG AS YOU CALL BEFORE 4:00 PM  At the [facility], you and your health needs are our priority. As part of our continuing mission to provide you with exceptional heart care, we have created designated Provider Care Teams. These Care Teams include your primary Cardiologist (physician) and Advanced Practice Providers (APPs- Physician Assistants and Nurse Practitioners) who all work together to provide you with the care you need, when you need it.   You may see any of the following providers on your designated Care Team at your next follow up: Dr Toribio Fuel Dr Ezra Shuck Dr. Ria Commander Dr. [name] Amy Lenetta, NP Caffie Shed, [place] [facility] [place], [place] Beckey Coe, NP [place] Lee, NP Ellouise Class, NP Tinnie Redman, PharmD Jaun Bash, PharmD   Please be sure to bring in all your medications bottles to every appointment.    Thank you for choosing [facility]

## 2023-12-08 NOTE — Progress Notes (Signed)
 ADVANCED HEART FAILURE FOLLOW UP CLINIC NOTE  Referring Physician: Toribio Jerel MATSU, MD  Primary Care: Toribio Jerel MATSU, MD Primary Cardiologist:  HPI: Sonia Morales is a 68 y.o. female with a PMH of HFrEF, CAD manifested by STEMI in 2017 s/p PCI to LAD, NSTEMI c/b cardiogenic shock s/p emergent CABG (LIMA to LAD, SVG to OM) in 2022, and type 2 diabetes who presents for follow up of heart failure..      3/23 Echo: 40-45% (improved from 35% pre-CABG in 9/22) GIDD, diffuse LV regional wall motion hypokinesis   Last seen in Cardiology clinic 12/03/22 for 6 month follow up. At the time she had been doing well without recurrence of angina.      Echo with EF 20-25%, entire septum and apex are akinetic,anterior wall, entire lateral wall, and entire inferior wall are hypokinetic, Rvnl. Mild MR and TR .   Cath showed critical left main coronary stenosis (mLM to dLM 95%, ost LAD to pLAD 100%, ostCx to pCx 100%), patent LIMA to LAD, patent SVG to OM, severely elevated LVEDP 42, PCWP 33, PAP 65/33/47, RA 23, CO/CI 4.39/2.33. She was diuresed with significant improvement in her symptoms and discharged.     SUBJECTIVE:  Reports that she has been struggling since her last visit.  She has had to come off many of her cardiac medications due to issues with low blood pressure and has had occasional desaturations at home.  She reports feeling extremely weak and is short of breath with mild exertion.  She feels like the last 6 months she has had a significant decrease in her functional status.  Denies any wheezing or prior known history of COPD.  Has quit smoking sometime ago.  PMH, current medications, allergies, social history, and family history reviewed in epic.  PHYSICAL EXAM: Vitals:   12/06/23 1527 12/06/23 1533  BP: 110/60   Pulse: 81   SpO2: (!) 88% 94%    GENERAL: NAD, fair appearing PULM:  Normal work of breathing, CTAB CARDIAC:  JVP: flat         Normal rate with regular rhythm. No  murmurs, rubs or gallops.  Trace edema. Warm and well perfused extremities. ABDOMEN: Soft, non-tender, non-distended. NEUROLOGIC: Patient is oriented x3 with no focal or lateralizing neurologic deficits.    DATA REVIEW  EKG: 02/2023: Normal sinus rhythm, right atrial enlargement, pulmonary disease pattern, left anterior fascicular block  ECHO: 11/24 LVEF 20 to 25%, severely reduced RV function, no aortic stenosis 03/2023: LVEF 35 to 40%, moderately decreased function, RV systolic function moderately reduced  CATH: 03/12/2023: RA 1 PA 30/10, PCWP 5, Fick CO/CI 4.1/2.2 03/06/23: Critical left main coronary stenosis Patent LIMA to the LAD, no significant subclavian gradient by pullback. Patent SVG to OM.  Ramus intermediate was not grafted. RCA is without significant disease PCWP 36/43 mean 33 mm Hg.  Severe elevated pulmonary pressures. PAP 65/33 mean 47 mm Hg. RA pressure elevated with mean 23 mm Hg Cardiac output 4.39 L/min, index 2.33    ASSESSMENT & PLAN:  Chronic systolic heart failure: Has not been seen in some time, progressive worsening of her symptoms and inability to tolerate GDMT. NYHA class IIIb. Does not appear volume overloaded.  - CPX for evaluation of progressive HF and potential ACC/AHA Stage D symptoms - Repeat echocardiogram - Decrease lasix  to every other day - Start digoxin  0.125mg  daily, will need lab monitoring - Continue spironolactone  25mg  daily - Not on BB, ARB discontinued due to BP -  Discussed advanced therapies including barostim, LVAD, txp - No SGLT2 with UTI hx   CAD with NSTEMI: STEMI in 2017 s/p PCI to LAD, NSTEMI c/b cardiogenic shock s/p emergent CABG (LIMA to LAD, SVG to OM) in 2022. No anginal symptoms. - Continue Asprin - Continue Plavix   - Continue Lipitor   DMII - Management per PCP, last A1c 7.9  Tobacco abuse: Prior history, in remission. Congratulated.   I spent 48 minutes caring for this patient today including face to face time,  ordering and reviewing labs, reviewing records from prior clinic visits, discussing symptoms and potential for advanced therapies including barostim, discussing CPX and potential need for RHC, seeing the patient, documenting in the record, and arranging follow ups.   [name], MD Advanced [facility] Mechanical Circulatory Support 12/08/23

## 2023-12-13 DIAGNOSIS — E119 Type 2 diabetes mellitus without complications: Secondary | ICD-10-CM | POA: Diagnosis not present

## 2023-12-13 DIAGNOSIS — I10 Essential (primary) hypertension: Secondary | ICD-10-CM | POA: Diagnosis not present

## 2023-12-13 DIAGNOSIS — F331 Major depressive disorder, recurrent, moderate: Secondary | ICD-10-CM | POA: Diagnosis not present

## 2023-12-17 ENCOUNTER — Encounter (HOSPITAL_COMMUNITY)

## 2023-12-19 DIAGNOSIS — R300 Dysuria: Secondary | ICD-10-CM | POA: Diagnosis not present

## 2023-12-19 DIAGNOSIS — Z6833 Body mass index (BMI) 33.0-33.9, adult: Secondary | ICD-10-CM | POA: Diagnosis not present

## 2023-12-24 ENCOUNTER — Ambulatory Visit (HOSPITAL_COMMUNITY)
Admission: RE | Admit: 2023-12-24 | Discharge: 2023-12-24 | Disposition: A | Discharge status: Home / Self Care | Source: Ambulatory Visit | Attending: Cardiology | Admitting: Cardiology

## 2023-12-24 DIAGNOSIS — I110 Hypertensive heart disease with heart failure: Secondary | ICD-10-CM | POA: Diagnosis not present

## 2023-12-24 DIAGNOSIS — I5022 Chronic systolic (congestive) heart failure: Secondary | ICD-10-CM | POA: Insufficient documentation

## 2023-12-24 LAB — ECHOCARDIOGRAM COMPLETE
AR max vel: 2.2 cm2
AV Area VTI: 2.18 cm2
AV Area mean vel: 2.38 cm2
AV Mean grad: 3 mmHg
AV Peak grad: 6 mmHg
Ao pk vel: 1.22 m/s
Area-P 1/2: 3.6 cm2
Calc EF: 43.2 %
MV VTI: 1.83 cm2
S' Lateral: 4.3 cm
Single Plane A2C EF: 40.5 %
Single Plane A4C EF: 44.1 %

## 2023-12-25 ENCOUNTER — Ambulatory Visit (HOSPITAL_COMMUNITY): Payer: Self-pay | Admitting: Cardiology

## 2023-12-26 ENCOUNTER — Ambulatory Visit (HOSPITAL_COMMUNITY): Attending: Cardiology

## 2023-12-26 DIAGNOSIS — I5022 Chronic systolic (congestive) heart failure: Secondary | ICD-10-CM

## 2023-12-30 ENCOUNTER — Encounter (HOSPITAL_COMMUNITY): Payer: Self-pay

## 2023-12-31 DIAGNOSIS — G894 Chronic pain syndrome: Secondary | ICD-10-CM | POA: Diagnosis not present

## 2023-12-31 DIAGNOSIS — M47812 Spondylosis without myelopathy or radiculopathy, cervical region: Secondary | ICD-10-CM | POA: Diagnosis not present

## 2023-12-31 DIAGNOSIS — M47816 Spondylosis without myelopathy or radiculopathy, lumbar region: Secondary | ICD-10-CM | POA: Diagnosis not present

## 2023-12-31 DIAGNOSIS — G4700 Insomnia, unspecified: Secondary | ICD-10-CM | POA: Diagnosis not present

## 2024-01-08 ENCOUNTER — Encounter (HOSPITAL_COMMUNITY): Admitting: Cardiology

## 2024-01-10 DIAGNOSIS — Z122 Encounter for screening for malignant neoplasm of respiratory organs: Secondary | ICD-10-CM | POA: Diagnosis not present

## 2024-01-10 DIAGNOSIS — Z87891 Personal history of nicotine dependence: Secondary | ICD-10-CM | POA: Diagnosis not present

## 2024-01-10 DIAGNOSIS — F17210 Nicotine dependence, cigarettes, uncomplicated: Secondary | ICD-10-CM | POA: Diagnosis not present

## 2024-01-14 DIAGNOSIS — F331 Major depressive disorder, recurrent, moderate: Secondary | ICD-10-CM | POA: Diagnosis not present

## 2024-01-14 DIAGNOSIS — E119 Type 2 diabetes mellitus without complications: Secondary | ICD-10-CM | POA: Diagnosis not present

## 2024-01-14 DIAGNOSIS — I10 Essential (primary) hypertension: Secondary | ICD-10-CM | POA: Diagnosis not present

## 2024-01-23 DIAGNOSIS — J9611 Chronic respiratory failure with hypoxia: Secondary | ICD-10-CM | POA: Diagnosis not present

## 2024-01-30 ENCOUNTER — Other Ambulatory Visit: Payer: Self-pay | Admitting: Cardiology

## 2024-02-04 ENCOUNTER — Encounter (HOSPITAL_COMMUNITY): Payer: Self-pay | Admitting: Cardiology

## 2024-02-04 ENCOUNTER — Ambulatory Visit (HOSPITAL_COMMUNITY)
Admission: RE | Admit: 2024-02-04 | Discharge: 2024-02-04 | Disposition: A | Discharge status: Home / Self Care | Source: Ambulatory Visit | Attending: Cardiology | Admitting: Cardiology

## 2024-02-04 VITALS — BP 112/60 | HR 71 | Wt 185.6 lb

## 2024-02-04 DIAGNOSIS — J9611 Chronic respiratory failure with hypoxia: Secondary | ICD-10-CM | POA: Diagnosis not present

## 2024-02-04 DIAGNOSIS — I2510 Atherosclerotic heart disease of native coronary artery without angina pectoris: Secondary | ICD-10-CM | POA: Insufficient documentation

## 2024-02-04 DIAGNOSIS — Z87891 Personal history of nicotine dependence: Secondary | ICD-10-CM | POA: Diagnosis not present

## 2024-02-04 DIAGNOSIS — R9431 Abnormal electrocardiogram [ECG] [EKG]: Secondary | ICD-10-CM | POA: Diagnosis not present

## 2024-02-04 DIAGNOSIS — Z955 Presence of coronary angioplasty implant and graft: Secondary | ICD-10-CM | POA: Insufficient documentation

## 2024-02-04 DIAGNOSIS — E119 Type 2 diabetes mellitus without complications: Secondary | ICD-10-CM | POA: Insufficient documentation

## 2024-02-04 DIAGNOSIS — I5022 Chronic systolic (congestive) heart failure: Secondary | ICD-10-CM | POA: Diagnosis not present

## 2024-02-04 DIAGNOSIS — Z951 Presence of aortocoronary bypass graft: Secondary | ICD-10-CM | POA: Insufficient documentation

## 2024-02-04 DIAGNOSIS — I252 Old myocardial infarction: Secondary | ICD-10-CM | POA: Insufficient documentation

## 2024-02-04 MED ORDER — FUROSEMIDE 40 MG PO TABS: 40.0000 mg | ORAL_TABLET | Freq: Every day | ORAL | 3 refills | Status: AC

## 2024-02-04 NOTE — Progress Notes (Signed)
 ADVANCED HEART FAILURE FOLLOW UP CLINIC NOTE  Referring Physician: Toribio Jerel MATSU, MD  Primary Care: Toribio Jerel MATSU, MD Primary Cardiologist:  HPI: Sonia Morales is a 68 y.o. female with a PMH of HFrEF, CAD manifested by STEMI in 2017 s/p PCI to LAD, NSTEMI c/b cardiogenic shock s/p emergent CABG (LIMA to LAD, SVG to OM) in 2022, and type 2 diabetes who presents for follow up of heart failure..      3/23 Echo: 40-45% (improved from 35% pre-CABG in 9/22) GIDD, diffuse LV regional wall motion hypokinesis   Last seen in Cardiology clinic 12/03/22 for 6 month follow up. At the time she had been doing well without recurrence of angina.      Echo with EF 20-25%, entire septum and apex are akinetic,anterior wall, entire lateral wall, and entire inferior wall are hypokinetic, Rvnl. Mild MR and TR .   Cath showed critical left main coronary stenosis (mLM to dLM 95%, ost LAD to pLAD 100%, ostCx to pCx 100%), patent LIMA to LAD, patent SVG to OM, severely elevated LVEDP 42, PCWP 33, PAP 65/33/47, RA 23, CO/CI 4.39/2.33. She was diuresed with significant improvement in her symptoms and discharged.     SUBJECTIVE:  Patient overall doing better than her last visit. She briefly had worsening oxygen  saturations with decreasing her Lasix  to every other day, but this improved while on digoxin  and with increasing her Lasix  to 4 times weekly.  She is still is short of breath with more than moderate exertion, but her blood pressure has overall improved since her last visit.  [place] with her kids recently, stays active at home by taking care of them. Recent test results reviewed.   PMH, current medications, allergies, social history, and family history reviewed in epic.  PHYSICAL EXAM: Vitals:   02/04/24 0908  BP: 112/60  Pulse: 71  SpO2: 92%    GENERAL: NAD, well appearing PULM:  Normal work of breathing, CTAB CARDIAC:  JVP: flat         Normal rate with regular rhythm. No  murmurs, rubs or gallops.  Trace edema. Warm and well perfused extremities. ABDOMEN: Soft, non-tender, non-distended. NEUROLOGIC: Patient is oriented x3 with no focal or lateralizing neurologic deficits.     DATA REVIEW  EKG: 02/2023: Normal sinus rhythm, right atrial enlargement, pulmonary disease pattern, left anterior fascicular block  ECHO: 11/24 LVEF 20 to 25%, severely reduced RV function, no aortic stenosis 03/2023: LVEF 35 to 40%, moderately decreased function, RV systolic function moderately reduced 12/2023: LVEF 40 to 45%, mildly decreased function, normal RV function, no significant valvular disease  CATH: 03/12/2023: RA 1 PA 30/10, PCWP 5, Fick CO/CI 4.1/2.2 03/06/23: Critical left main coronary stenosis Patent LIMA to the LAD, no significant subclavian gradient by pullback. Patent SVG to OM.  Ramus intermediate was not grafted. RCA is without significant disease PCWP 36/43 mean 33 mm Hg.  Severe elevated pulmonary pressures. PAP 65/33 mean 47 mm Hg. RA pressure elevated with mean 23 mm Hg Cardiac output 4.39 L/min, index 2.33   CPX: Exercise testing with gas exchange demonstrates a mild functional impairment with a peak VO2 of 13.3 ml/kg/min (75% of the age/gender/weight matched sedentary norms). The RER of 1.09 indicates a near maximal effort. When adjusted to the patient's ideal body weight of 135 lb (61.2 kg) the peak VO2 is 18.2 ml/kg (ibw)/min (83% of the ibw-adjusted predicted). The VE/VCO2 slope is normal. The oxygen  uptake efficiency slope (OUES) is below predicted  values (72 of predicted). The VO2 at the ventilatory threshold was below normal at 38% of the predicted peak VO2. At peak exercise, the ventilation reached 51% of the measured MVV indicating ventilatory reserve remained. The O2pulse (a surrogate for stroke volume) increases initially with exercise and quickly plateaus, peaking at 8.44ml/beat (91% of predicted)   ASSESSMENT & PLAN:  Chronic systolic heart  failure: EF now in the mildly reduced range, NYHA Class III but suspect multifactorial. Near normal O2 pulse and VE/VCO2 slope suggest that her cardiac symptoms are not playing an overly large part.  - CPX reviewed in clinic - Echo reviewed in clinic - Continue digoxin  0.125mg  daily, lab monitoring at next visit, took today - Continue spironolactone  25mg  daily - Continue lasix  40mg  4 times weekly - Not on BB, ARB discontinued due to BP - Can consider barostim in the future, but would complete pulmonary workup first   CAD with NSTEMI: STEMI in 2017 s/p PCI to LAD, NSTEMI c/b cardiogenic shock s/p emergent CABG (LIMA to LAD, SVG to OM) in 2022. No anginal symptoms. - Stop aspirin  - Continue Plavix   - Continue Lipitor   DMII - Management per PCP, last A1c 7.9  Tobacco abuse: Prior history, in remission. Congratulated.   Chronic hypoxic respiratory failure: Issues with desaturation at home, occasionally wearing oxygen . Suspect somewhat multifactorial. - PFTs   [name], MD Advanced [facility] Failure Mechanical Circulatory Support 02/04/24

## 2024-02-04 NOTE — Patient Instructions (Signed)
 STOP Asprin.  CHANGE Lasix  to 40 mg daily.  Your provider has ordered a lung function test for you.  Your physician recommends that you schedule a follow-up appointment in: 3 months ( January 2026) ** PLEASE CALL THE OFFICE IN DECEMBER TO ARRANGE YOUR FOLLOW UP APPOINTMENT.**  If you have any questions or concerns before your next appointment please send us  a message through [place] or call our office at [phone].    TO LEAVE A MESSAGE FOR THE NURSE SELECT OPTION 2, PLEASE LEAVE A MESSAGE INCLUDING: YOUR NAME DATE OF BIRTH CALL BACK NUMBER REASON FOR CALL**this is important as we prioritize the call backs  YOU WILL RECEIVE A CALL BACK THE SAME DAY AS LONG AS YOU CALL BEFORE 4:00 PM  At the [facility], you and your health needs are our priority. As part of our continuing mission to provide you with exceptional heart care, we have created designated Provider Care Teams. These Care Teams include your primary Cardiologist (physician) and Advanced Practice Providers (APPs- Physician Assistants and Nurse Practitioners) who all work together to provide you with the care you need, when you need it.   You may see any of the following providers on your designated Care Team at your next follow up: Dr Toribio Fuel Dr Ezra Shuck Dr. Ria Commander Dr. [name] Amy Lenetta, NP Caffie Shed, [place] [facility] [place], [place] Beckey Coe, NP [place] Lee, NP Ellouise Class, NP Tinnie Redman, PharmD Jaun Bash, PharmD   Please be sure to bring in all your medications bottles to every appointment.    Thank you for choosing  [facility]

## 2024-02-11 ENCOUNTER — Encounter (HOSPITAL_COMMUNITY)

## 2024-02-14 DIAGNOSIS — I10 Essential (primary) hypertension: Secondary | ICD-10-CM | POA: Diagnosis not present

## 2024-02-14 DIAGNOSIS — F331 Major depressive disorder, recurrent, moderate: Secondary | ICD-10-CM | POA: Diagnosis not present

## 2024-02-14 DIAGNOSIS — E119 Type 2 diabetes mellitus without complications: Secondary | ICD-10-CM | POA: Diagnosis not present

## 2024-02-28 ENCOUNTER — Inpatient Hospital Stay (HOSPITAL_COMMUNITY): Admission: RE | Admit: 2024-02-28 | Source: Ambulatory Visit

## 2024-03-25 DIAGNOSIS — M47816 Spondylosis without myelopathy or radiculopathy, lumbar region: Secondary | ICD-10-CM | POA: Diagnosis not present

## 2024-03-25 DIAGNOSIS — M47812 Spondylosis without myelopathy or radiculopathy, cervical region: Secondary | ICD-10-CM | POA: Diagnosis not present

## 2024-03-25 DIAGNOSIS — G4700 Insomnia, unspecified: Secondary | ICD-10-CM | POA: Diagnosis not present

## 2024-03-25 DIAGNOSIS — G894 Chronic pain syndrome: Secondary | ICD-10-CM | POA: Diagnosis not present

## 2024-05-02 ENCOUNTER — Other Ambulatory Visit (HOSPITAL_COMMUNITY): Payer: Self-pay | Admitting: Cardiology

## 2024-05-02 DIAGNOSIS — I5043 Acute on chronic combined systolic (congestive) and diastolic (congestive) heart failure: Secondary | ICD-10-CM
